# Patient Record
Sex: Female | Born: 1935 | Race: White | Hispanic: No | Marital: Married | Smoking: Former smoker
Health system: Southern US, Community
[De-identification: ages and names within clinical notes are randomized; demographics above are authoritative.]

## PROBLEM LIST (undated history)

## (undated) DIAGNOSIS — M109 Gout, unspecified: Secondary | ICD-10-CM

## (undated) DIAGNOSIS — I10 Essential (primary) hypertension: Secondary | ICD-10-CM

## (undated) DIAGNOSIS — I7300 Raynaud's syndrome without gangrene: Secondary | ICD-10-CM

## (undated) DIAGNOSIS — C801 Malignant (primary) neoplasm, unspecified: Secondary | ICD-10-CM

## (undated) DIAGNOSIS — Z8551 Personal history of malignant neoplasm of bladder: Secondary | ICD-10-CM

## (undated) DIAGNOSIS — C679 Malignant neoplasm of bladder, unspecified: Secondary | ICD-10-CM

## (undated) DIAGNOSIS — N289 Disorder of kidney and ureter, unspecified: Secondary | ICD-10-CM

## (undated) DIAGNOSIS — Z85528 Personal history of other malignant neoplasm of kidney: Secondary | ICD-10-CM

## (undated) DIAGNOSIS — J45909 Unspecified asthma, uncomplicated: Secondary | ICD-10-CM

## (undated) DIAGNOSIS — J449 Chronic obstructive pulmonary disease, unspecified: Secondary | ICD-10-CM

## (undated) DIAGNOSIS — I509 Heart failure, unspecified: Secondary | ICD-10-CM

## (undated) DIAGNOSIS — M1990 Unspecified osteoarthritis, unspecified site: Secondary | ICD-10-CM

## (undated) DIAGNOSIS — K219 Gastro-esophageal reflux disease without esophagitis: Secondary | ICD-10-CM

## (undated) HISTORY — DX: Gastro-esophageal reflux disease without esophagitis: K21.9

## (undated) HISTORY — PX: CATARACT EXTRACTION: SUR2

## (undated) HISTORY — DX: Unspecified osteoarthritis, unspecified site: M19.90

## (undated) HISTORY — DX: Heart failure, unspecified: I50.9

## (undated) HISTORY — DX: Personal history of other malignant neoplasm of kidney: Z85.528

## (undated) HISTORY — DX: Gout, unspecified: M10.9

## (undated) HISTORY — PX: BACK SURGERY: SHX140

## (undated) HISTORY — PX: NEPHRECTOMY: SHX65

## (undated) HISTORY — DX: Personal history of malignant neoplasm of bladder: Z85.51

## (undated) HISTORY — PX: APPENDECTOMY: SHX54

---

## 1999-01-30 ENCOUNTER — Ambulatory Visit (HOSPITAL_COMMUNITY): Admission: RE | Admit: 1999-01-30 | Discharge: 1999-01-30 | Payer: Self-pay | Admitting: Neurosurgery

## 1999-01-30 ENCOUNTER — Encounter: Payer: Self-pay | Admitting: Neurosurgery

## 1999-09-22 DIAGNOSIS — C801 Malignant (primary) neoplasm, unspecified: Secondary | ICD-10-CM

## 1999-09-22 HISTORY — DX: Malignant (primary) neoplasm, unspecified: C80.1

## 2003-09-24 ENCOUNTER — Other Ambulatory Visit: Payer: Self-pay

## 2004-09-23 ENCOUNTER — Ambulatory Visit: Payer: Self-pay | Admitting: Urology

## 2004-10-28 ENCOUNTER — Ambulatory Visit: Payer: Self-pay | Admitting: Internal Medicine

## 2005-03-31 ENCOUNTER — Ambulatory Visit: Payer: Self-pay | Admitting: Internal Medicine

## 2005-09-25 ENCOUNTER — Ambulatory Visit: Payer: Self-pay | Admitting: Internal Medicine

## 2005-11-10 ENCOUNTER — Ambulatory Visit: Payer: Self-pay | Admitting: Internal Medicine

## 2005-12-17 ENCOUNTER — Ambulatory Visit: Payer: Self-pay | Admitting: Gastroenterology

## 2006-03-31 ENCOUNTER — Ambulatory Visit: Payer: Self-pay

## 2006-04-07 ENCOUNTER — Ambulatory Visit: Payer: Self-pay | Admitting: General Surgery

## 2006-04-13 ENCOUNTER — Other Ambulatory Visit: Payer: Self-pay

## 2006-04-13 ENCOUNTER — Ambulatory Visit: Payer: Self-pay | Admitting: General Surgery

## 2006-04-21 ENCOUNTER — Ambulatory Visit: Payer: Self-pay | Admitting: General Surgery

## 2006-05-03 ENCOUNTER — Ambulatory Visit: Payer: Self-pay | Admitting: Internal Medicine

## 2006-08-06 ENCOUNTER — Other Ambulatory Visit: Payer: Self-pay

## 2006-08-06 ENCOUNTER — Emergency Department: Payer: Self-pay | Admitting: Emergency Medicine

## 2006-08-19 ENCOUNTER — Ambulatory Visit: Payer: Self-pay | Admitting: Internal Medicine

## 2006-09-04 ENCOUNTER — Ambulatory Visit: Payer: Self-pay | Admitting: Internal Medicine

## 2006-12-01 ENCOUNTER — Ambulatory Visit: Payer: Self-pay | Admitting: Internal Medicine

## 2007-05-25 ENCOUNTER — Ambulatory Visit: Payer: Self-pay | Admitting: Physician Assistant

## 2007-07-06 ENCOUNTER — Ambulatory Visit: Payer: Self-pay | Admitting: Urology

## 2007-07-06 ENCOUNTER — Other Ambulatory Visit: Payer: Self-pay

## 2007-07-20 ENCOUNTER — Ambulatory Visit: Payer: Self-pay | Admitting: Urology

## 2007-09-09 ENCOUNTER — Ambulatory Visit: Payer: Self-pay | Admitting: Internal Medicine

## 2007-12-05 ENCOUNTER — Ambulatory Visit: Payer: Self-pay | Admitting: Internal Medicine

## 2008-04-13 ENCOUNTER — Emergency Department: Payer: Self-pay | Admitting: Emergency Medicine

## 2008-12-06 ENCOUNTER — Ambulatory Visit: Payer: Self-pay | Admitting: Internal Medicine

## 2008-12-12 ENCOUNTER — Ambulatory Visit: Payer: Self-pay | Admitting: Urology

## 2008-12-19 ENCOUNTER — Ambulatory Visit: Payer: Self-pay | Admitting: Urology

## 2009-06-18 ENCOUNTER — Ambulatory Visit: Payer: Self-pay | Admitting: Urology

## 2009-12-10 ENCOUNTER — Ambulatory Visit: Payer: Self-pay | Admitting: Internal Medicine

## 2010-12-15 ENCOUNTER — Ambulatory Visit: Payer: Self-pay | Admitting: Internal Medicine

## 2011-06-09 ENCOUNTER — Ambulatory Visit: Payer: Self-pay | Admitting: Urology

## 2011-10-15 ENCOUNTER — Ambulatory Visit: Payer: Self-pay | Admitting: Internal Medicine

## 2011-11-03 ENCOUNTER — Ambulatory Visit: Payer: Self-pay | Admitting: Internal Medicine

## 2011-12-17 ENCOUNTER — Ambulatory Visit: Payer: Self-pay | Admitting: Internal Medicine

## 2012-05-25 ENCOUNTER — Encounter: Payer: Self-pay | Admitting: Internal Medicine

## 2012-06-21 ENCOUNTER — Encounter: Payer: Self-pay | Admitting: Internal Medicine

## 2012-07-25 ENCOUNTER — Emergency Department: Payer: Self-pay | Admitting: Emergency Medicine

## 2012-07-26 ENCOUNTER — Ambulatory Visit: Payer: Self-pay | Admitting: Neurology

## 2012-07-26 ENCOUNTER — Encounter: Payer: Self-pay | Admitting: Internal Medicine

## 2012-08-21 ENCOUNTER — Encounter: Payer: Self-pay | Admitting: Internal Medicine

## 2012-09-21 ENCOUNTER — Encounter: Payer: Self-pay | Admitting: Internal Medicine

## 2012-10-22 ENCOUNTER — Encounter: Payer: Self-pay | Admitting: Internal Medicine

## 2012-12-19 ENCOUNTER — Ambulatory Visit: Payer: Self-pay | Admitting: Internal Medicine

## 2013-03-02 ENCOUNTER — Emergency Department (HOSPITAL_COMMUNITY)
Admission: EM | Admit: 2013-03-02 | Discharge: 2013-03-02 | Disposition: A | Discharge status: Home / Self Care | Payer: Medicare Other | Attending: Emergency Medicine | Admitting: Emergency Medicine

## 2013-03-02 ENCOUNTER — Emergency Department (HOSPITAL_COMMUNITY): Payer: Medicare Other

## 2013-03-02 ENCOUNTER — Encounter (HOSPITAL_COMMUNITY): Payer: Self-pay | Admitting: Emergency Medicine

## 2013-03-02 DIAGNOSIS — Y9389 Activity, other specified: Secondary | ICD-10-CM | POA: Insufficient documentation

## 2013-03-02 DIAGNOSIS — I10 Essential (primary) hypertension: Secondary | ICD-10-CM | POA: Insufficient documentation

## 2013-03-02 DIAGNOSIS — T148XXA Other injury of unspecified body region, initial encounter: Secondary | ICD-10-CM | POA: Insufficient documentation

## 2013-03-02 DIAGNOSIS — Z7982 Long term (current) use of aspirin: Secondary | ICD-10-CM | POA: Insufficient documentation

## 2013-03-02 DIAGNOSIS — W19XXXA Unspecified fall, initial encounter: Secondary | ICD-10-CM

## 2013-03-02 DIAGNOSIS — Y929 Unspecified place or not applicable: Secondary | ICD-10-CM | POA: Insufficient documentation

## 2013-03-02 DIAGNOSIS — Z23 Encounter for immunization: Secondary | ICD-10-CM | POA: Insufficient documentation

## 2013-03-02 DIAGNOSIS — IMO0002 Reserved for concepts with insufficient information to code with codable children: Secondary | ICD-10-CM | POA: Insufficient documentation

## 2013-03-02 DIAGNOSIS — S0990XA Unspecified injury of head, initial encounter: Secondary | ICD-10-CM | POA: Insufficient documentation

## 2013-03-02 DIAGNOSIS — M549 Dorsalgia, unspecified: Secondary | ICD-10-CM

## 2013-03-02 DIAGNOSIS — Z79899 Other long term (current) drug therapy: Secondary | ICD-10-CM | POA: Insufficient documentation

## 2013-03-02 DIAGNOSIS — W010XXA Fall on same level from slipping, tripping and stumbling without subsequent striking against object, initial encounter: Secondary | ICD-10-CM | POA: Insufficient documentation

## 2013-03-02 HISTORY — DX: Essential (primary) hypertension: I10

## 2013-03-02 MED ORDER — OXYCODONE-ACETAMINOPHEN 5-325 MG PO TABS: 1.0000 | ORAL_TABLET | Freq: Four times a day (QID) | ORAL | Status: DC | PRN

## 2013-03-02 MED ORDER — TETANUS-DIPHTH-ACELL PERTUSSIS 5-2.5-18.5 LF-MCG/0.5 IM SUSP
0.5000 mL | Freq: Once | INTRAMUSCULAR | Status: AC
  Administered 2013-03-02: 0.5 mL via INTRAMUSCULAR
  Filled 2013-03-02: qty 0.5

## 2013-03-02 MED ORDER — HYDROMORPHONE HCL PF 1 MG/ML IJ SOLN
1.0000 mg | Freq: Once | INTRAMUSCULAR | Status: AC
  Administered 2013-03-02: 1 mg via INTRAVENOUS
  Filled 2013-03-02: qty 1

## 2013-03-02 MED ORDER — ONDANSETRON HCL 4 MG/2ML IJ SOLN
4.0000 mg | Freq: Once | INTRAMUSCULAR | Status: AC
  Administered 2013-03-02: 4 mg via INTRAVENOUS
  Filled 2013-03-02: qty 2

## 2013-03-02 NOTE — ED Notes (Signed)
Patient transported to CT 

## 2013-03-02 NOTE — ED Provider Notes (Signed)
History     CSN: [id number]  Arrival date & time 03/02/13  1221   First MD Initiated Contact with Patient 03/02/13 1235      Chief Complaint  Patient presents with  . Fall  . Laceration  . Abrasion    (Consider location/radiation/quality/duration/timing/severity/associated sxs/prior treatment) Patient is a 77 y.o. female presenting with fall and skin laceration. The history is provided by the patient (the pt fell and hit her head. she tripped.  no loc). No language interpreter was used.  Fall This is a new problem. The current episode started 1 to 2 hours ago. The problem occurs rarely. The problem has not changed since onset.Associated symptoms include headaches. Pertinent negatives include no chest pain and no abdominal pain. Nothing aggravates the symptoms. Nothing relieves the symptoms.  Laceration   Past Medical History  Diagnosis Date  . Hypertension     Past Surgical History  Procedure Laterality Date  . Back surgery      No family history on file.  History  Substance Use Topics  . Smoking status: Not on file  . Smokeless tobacco: Not on file  . Alcohol Use: Not on file    OB History   Grav Para Term Preterm Abortions TAB SAB Ect Mult Living                  Review of Systems  Constitutional: Negative for appetite change and fatigue.  HENT: Negative for congestion, sinus pressure and ear discharge.   Eyes: Negative for discharge.  Respiratory: Negative for cough.   Cardiovascular: Negative for chest pain.  Gastrointestinal: Negative for abdominal pain and diarrhea.  Genitourinary: Negative for frequency and hematuria.  Musculoskeletal: Positive for back pain.  Skin: Negative for rash.  Neurological: Positive for headaches. Negative for seizures.  Psychiatric/Behavioral: Negative for hallucinations.    Allergies  Review of patient's allergies indicates no known allergies.  Home Medications   Current Outpatient Rx  Name  Route  Sig  Dispense   Refill  . albuterol-ipratropium (COMBIVENT) 18-103 MCG/ACT inhaler   Inhalation   Inhale 2 puffs into the lungs every 6 (six) hours as needed for wheezing or shortness of breath.         Marland Kitchen amoxicillin (AMOXIL) 500 MG capsule   Oral   Take 500 mg by mouth every morning. Take once a day for 30 days         . aspirin EC 81 MG tablet   Oral   Take 81 mg by mouth every morning.         Marland Kitchen CALCIUM PO   Oral   Take 2 capsules by mouth every morning.         . Fluticasone-Salmeterol (ADVAIR) 100-50 MCG/DOSE AEPB   Inhalation   Inhale 1 puff into the lungs 2 (two) times daily.         Marland Kitchen losartan (COZAAR) 50 MG tablet   Oral   Take 50 mg by mouth every morning.         . metoprolol succinate (TOPROL-XL) 25 MG 24 hr tablet   Oral   Take 25 mg by mouth every morning.         . Multiple Vitamin (MULTIVITAMIN WITH MINERALS) TABS   Oral   Take 1 tablet by mouth every morning.         . theophylline (THEO-24) 300 MG 24 hr capsule   Oral   Take 300 mg by mouth every morning.         Marland Kitchen  oxyCODONE-acetaminophen (PERCOCET/ROXICET) 5-325 MG per tablet   Oral   Take 1 tablet by mouth every 6 (six) hours as needed for pain.   30 tablet   0     BP 166/83  Pulse 78  Temp(Src) 98.2 F (36.8 C) (Oral)  Resp 19  SpO2 94%  Physical Exam  Constitutional: She is oriented to person, place, and time. She appears well-developed.  HENT:  Head: Normocephalic.  Pt has a small laceration with a large hematoma to the right orbit  Eyes: Conjunctivae and EOM are normal. No scleral icterus.  Neck: Neck supple. No thyromegaly present.  Cardiovascular: Normal rate and regular rhythm.  Exam reveals no gallop and no friction rub.   No murmur heard. Pulmonary/Chest: No stridor. She has no wheezes. She has no rales. She exhibits no tenderness.  Abdominal: She exhibits no distension. There is no tenderness. There is no rebound.  Musculoskeletal: She exhibits no edema.  Moderate  tenderness lumbar spine  Lymphadenopathy:    She has no cervical adenopathy.  Neurological: She is oriented to person, place, and time. Coordination normal.  Skin: No rash noted. No erythema.  Psychiatric: She has a normal mood and affect. Her behavior is normal.    ED Course  Procedures (including critical care time)  Labs Reviewed - No data to display Dg Lumbar Spine Complete  03/02/2013   *RADIOLOGY REPORT*  Clinical Data: Fall.  Pain.  LUMBAR SPINE - COMPLETE 4+ VIEW  Comparison: None.  Findings: Levoconvex lumbar scoliosis.  Abdominal aortic atherosclerosis.  Surgical clips in the left abdomen.  Multilevel lumbar spondylosis.  There is irregularity of the superior endplate of L4, suspicious for acute or subacute compression fracture.  In the absence of comparison exams, this is age indeterminant.  Lumbar facet arthrosis is present.  There is no radiographic evidence of retropulsion.  Loss of height of L4 is estimated at 15% or less. Grade 1 anterolisthesis of L4 on L5 measures 4 mm, degenerative.  IMPRESSION: Superior endplate depression of L4 is suspicious for acute or subacute compression fracture.  In the absence of comparisons, this is age indeterminant.  Consider MRI for determining the age of the fracture.  Loss of height is mild and there is no radiographic retropulsion.   Original Report Authenticated By: Andreas Newport, M.D.   Dg Hip Complete Right  03/02/2013   *RADIOLOGY REPORT*  Clinical Data: Fall.  Laceration.  Abrasion.  RIGHT HIP - COMPLETE 2+ VIEW  Comparison: None.  Findings: Right total hip arthroplasty is present.  No complicating features.  No periprosthetic fracture.  Obturator rings appear intact.  Prosthetic right hip is located.  Calcified granuloma are present projected over the pelvis.  Left hip appears within normal limits.  Sacral arcades appear within normal limits.  Levoconvex curvature of the lumbar spine is partially visualized.  IMPRESSION: Uncomplicated right  total hip arthroplasty.  No acute osseous abnormality.   Original Report Authenticated By: Andreas Newport, M.D.   Ct Head Wo Contrast  03/02/2013   *RADIOLOGY REPORT*  Clinical Data:  Fall.  Hematoma of the right orbit. Possible Loss of consciousness.  CT HEAD WITHOUT CONTRAST CT MAXILLOFACIAL WITHOUT CONTRAST CT CERVICAL SPINE WITHOUT CONTRAST  Technique:  Multidetector CT imaging of the head, cervical spine, and maxillofacial structures were performed using the standard protocol without intravenous contrast. Multiplanar CT image reconstructions of the cervical spine and maxillofacial structures were also generated.  Comparison:   None  CT HEAD  Findings: 1.4 cm x 4 cm  hematoma is present over the superior lateral orbital rim on the right.  There is no underlying fracture of the orbit and globe appears intact.  Intracranially, there is no mass lesion, mass effect, midline shift, hydrocephalus, or hemorrhage.  No acute infarct.  Calvarium intact.  IMPRESSION: Negative CT brain.  Large right periorbital hematoma.  CT MAXILLOFACIAL  Findings:  The mandibular condyles are located.  The pterygoid plates are intact.  Globes and orbits are intact.  No facial fracture.  Soft tissue hematoma over the superior lateral right orbital rim is again noted.  The paranasal sinuses are clear. Mastoid air cells clear.  Upper dentures are present.  Artifact is present from dental fixation in the mandible.  Nasal bones intact. Bilateral lens extractions.  IMPRESSION: Negative for facial fracture.  CT CERVICAL SPINE  Findings:   Lung apices demonstrate severe centrilobular and paraseptal emphysema with areas of scarring.  14 mm right thyroid lobe nodule is present, nonspecific on CT.  There is no cervical spine fracture or dislocation.  Odontoid intact.  Craniocervical and atlantodental degenerative disease.  Severe degenerative disc disease from C4-C5 through C6-C7 with shallow disc osteophyte complexes.  2 mm retrolisthesis of C4  on C5 appears degenerative associated with collapse of the disc space.  Severe left C2-C3 facet arthrosis.  Moderate facet arthrosis at most of the levels of the cervical spine.  Bilateral foraminal encroachment associated with uncovertebral spurring.  T2-T3 severe left facet arthrosis noted. 2 mm of degenerative anterolisthesis of C3 on C4.  IMPRESSION: 1.  Moderate multilevel cervical spondylosis. 2.  No cervical spine fracture or dislocation. 3.  Right thyroid lobe hypo attenuating nodule. Consider further evaluation with thyroid ultrasound.  If patient is clinically hyperthyroid, consider nuclear medicine thyroid uptake and scan.   Original Report Authenticated By: Andreas Newport, M.D.   Ct Cervical Spine Wo Contrast  03/02/2013   *RADIOLOGY REPORT*  Clinical Data:  Fall.  Hematoma of the right orbit. Possible Loss of consciousness.  CT HEAD WITHOUT CONTRAST CT MAXILLOFACIAL WITHOUT CONTRAST CT CERVICAL SPINE WITHOUT CONTRAST  Technique:  Multidetector CT imaging of the head, cervical spine, and maxillofacial structures were performed using the standard protocol without intravenous contrast. Multiplanar CT image reconstructions of the cervical spine and maxillofacial structures were also generated.  Comparison:   None  CT HEAD  Findings: 1.4 cm x 4 cm hematoma is present over the superior lateral orbital rim on the right.  There is no underlying fracture of the orbit and globe appears intact.  Intracranially, there is no mass lesion, mass effect, midline shift, hydrocephalus, or hemorrhage.  No acute infarct.  Calvarium intact.  IMPRESSION: Negative CT brain.  Large right periorbital hematoma.  CT MAXILLOFACIAL  Findings:  The mandibular condyles are located.  The pterygoid plates are intact.  Globes and orbits are intact.  No facial fracture.  Soft tissue hematoma over the superior lateral right orbital rim is again noted.  The paranasal sinuses are clear. Mastoid air cells clear.  Upper dentures are  present.  Artifact is present from dental fixation in the mandible.  Nasal bones intact. Bilateral lens extractions.  IMPRESSION: Negative for facial fracture.  CT CERVICAL SPINE  Findings:   Lung apices demonstrate severe centrilobular and paraseptal emphysema with areas of scarring.  14 mm right thyroid lobe nodule is present, nonspecific on CT.  There is no cervical spine fracture or dislocation.  Odontoid intact.  Craniocervical and atlantodental degenerative disease.  Severe degenerative disc disease from C4-C5 through  C6-C7 with shallow disc osteophyte complexes.  2 mm retrolisthesis of C4 on C5 appears degenerative associated with collapse of the disc space.  Severe left C2-C3 facet arthrosis.  Moderate facet arthrosis at most of the levels of the cervical spine.  Bilateral foraminal encroachment associated with uncovertebral spurring.  T2-T3 severe left facet arthrosis noted. 2 mm of degenerative anterolisthesis of C3 on C4.  IMPRESSION: 1.  Moderate multilevel cervical spondylosis. 2.  No cervical spine fracture or dislocation. 3.  Right thyroid lobe hypo attenuating nodule. Consider further evaluation with thyroid ultrasound.  If patient is clinically hyperthyroid, consider nuclear medicine thyroid uptake and scan.   Original Report Authenticated By: Andreas Newport, M.D.   Ct Maxillofacial Wo Cm  03/02/2013   *RADIOLOGY REPORT*  Clinical Data:  Fall.  Hematoma of the right orbit. Possible Loss of consciousness.  CT HEAD WITHOUT CONTRAST CT MAXILLOFACIAL WITHOUT CONTRAST CT CERVICAL SPINE WITHOUT CONTRAST  Technique:  Multidetector CT imaging of the head, cervical spine, and maxillofacial structures were performed using the standard protocol without intravenous contrast. Multiplanar CT image reconstructions of the cervical spine and maxillofacial structures were also generated.  Comparison:   None  CT HEAD  Findings: 1.4 cm x 4 cm hematoma is present over the superior lateral orbital rim on the right.   There is no underlying fracture of the orbit and globe appears intact.  Intracranially, there is no mass lesion, mass effect, midline shift, hydrocephalus, or hemorrhage.  No acute infarct.  Calvarium intact.  IMPRESSION: Negative CT brain.  Large right periorbital hematoma.  CT MAXILLOFACIAL  Findings:  The mandibular condyles are located.  The pterygoid plates are intact.  Globes and orbits are intact.  No facial fracture.  Soft tissue hematoma over the superior lateral right orbital rim is again noted.  The paranasal sinuses are clear. Mastoid air cells clear.  Upper dentures are present.  Artifact is present from dental fixation in the mandible.  Nasal bones intact. Bilateral lens extractions.  IMPRESSION: Negative for facial fracture.  CT CERVICAL SPINE  Findings:   Lung apices demonstrate severe centrilobular and paraseptal emphysema with areas of scarring.  14 mm right thyroid lobe nodule is present, nonspecific on CT.  There is no cervical spine fracture or dislocation.  Odontoid intact.  Craniocervical and atlantodental degenerative disease.  Severe degenerative disc disease from C4-C5 through C6-C7 with shallow disc osteophyte complexes.  2 mm retrolisthesis of C4 on C5 appears degenerative associated with collapse of the disc space.  Severe left C2-C3 facet arthrosis.  Moderate facet arthrosis at most of the levels of the cervical spine.  Bilateral foraminal encroachment associated with uncovertebral spurring.  T2-T3 severe left facet arthrosis noted. 2 mm of degenerative anterolisthesis of C3 on C4.  IMPRESSION: 1.  Moderate multilevel cervical spondylosis. 2.  No cervical spine fracture or dislocation. 3.  Right thyroid lobe hypo attenuating nodule. Consider further evaluation with thyroid ultrasound.  If patient is clinically hyperthyroid, consider nuclear medicine thyroid uptake and scan.   Original Report Authenticated By: Andreas Newport, M.D.     1. Fall, initial encounter   2. Head injury,  initial encounter   3. Back pain       MDM  Pt will follow up with her md and tx with pain meds.        Benny Lennert, MD 03/02/13 1434

## 2013-03-02 NOTE — ED Notes (Signed)
Voiced understanding of instructions given 

## 2013-03-02 NOTE — ED Notes (Signed)
Returned from x-ray via stretcher.

## 2013-03-02 NOTE — Progress Notes (Signed)
   CARE MANAGEMENT ED NOTE 03/02/2013  Patient:  Catherine Landry, Catherine Landry   Account Number:  [id number]  Date Initiated:  03/02/2013  Documentation initiated by:  Radford Pax  Subjective/Objective Assessment:   Patient presented to ED post fall with back pain     Subjective/Objective Assessment Detail:     Action/Plan:   Action/Plan Detail:   Anticipated DC Date:       Status Recommendation to Physician:   Result of Recommendation:    Other ED Services  Consult Working Plan    DC Planning Services  Other  PCP issues    Choice offered to / List presented to:            Status of service:  Completed, signed off  ED Comments:   ED Comments Detail:  Patient listed as not having a pcp.  EDCM spoke to patient's husband as patient was in x-ray.  Patient's husband stated that he pcp is Dr. Joaquim Nam in [place].  No further needs at this time.

## 2013-03-02 NOTE — ED Notes (Signed)
Pt states that she was walking and fell.states that she has back pain and that she has pain to rt upper eye with a laceration bleeding controlled and a hematoma noted, bil abrasions to knees, takes an ASA no thinners. Alert x4,

## 2013-03-02 NOTE — ED Notes (Signed)
MD at bedside. Notified of pt O2 sat

## 2013-07-11 ENCOUNTER — Ambulatory Visit: Payer: Self-pay | Admitting: Urology

## 2013-07-11 DIAGNOSIS — I10 Essential (primary) hypertension: Secondary | ICD-10-CM

## 2013-07-19 ENCOUNTER — Ambulatory Visit: Payer: Self-pay | Admitting: Urology

## 2013-07-21 LAB — PATHOLOGY REPORT

## 2013-09-21 DIAGNOSIS — C679 Malignant neoplasm of bladder, unspecified: Secondary | ICD-10-CM

## 2013-09-21 HISTORY — DX: Malignant neoplasm of bladder, unspecified: C67.9

## 2013-10-23 ENCOUNTER — Ambulatory Visit: Payer: Self-pay | Admitting: Urology

## 2013-10-24 LAB — URINE CULTURE

## 2013-11-01 ENCOUNTER — Ambulatory Visit: Payer: Self-pay | Admitting: Urology

## 2013-11-04 LAB — PATHOLOGY REPORT

## 2013-12-04 ENCOUNTER — Emergency Department: Payer: Self-pay | Admitting: Emergency Medicine

## 2013-12-07 ENCOUNTER — Ambulatory Visit: Payer: Self-pay | Admitting: Internal Medicine

## 2013-12-20 ENCOUNTER — Ambulatory Visit: Payer: Self-pay | Admitting: Internal Medicine

## 2014-12-03 ENCOUNTER — Encounter
Admit: 2014-12-03 | Disposition: A | Discharge status: Home / Self Care | Payer: Self-pay | Attending: Radiation Oncology | Admitting: Radiation Oncology

## 2014-12-21 ENCOUNTER — Encounter
Admit: 2014-12-21 | Disposition: A | Discharge status: Home / Self Care | Payer: Self-pay | Attending: Radiation Oncology | Admitting: Radiation Oncology

## 2014-12-24 ENCOUNTER — Ambulatory Visit
Admit: 2014-12-24 | Disposition: A | Discharge status: Home / Self Care | Payer: Self-pay | Attending: Internal Medicine | Admitting: Internal Medicine

## 2015-01-11 NOTE — Op Note (Signed)
PATIENT NAME:  Catherine Landry, Catherine Landry MR#:  [id number] DATE OF BIRTH:  08-30-1936  DATE OF PROCEDURE:  07/19/2013  PREOPERATIVE DIAGNOSIS: Bladder tumor.   POSTOPERATIVE DIAGNOSIS: Bladder tumor.   PROCEDURE:  1. Transurethral resection of bladder tumor. 2. Right retrograde pyelogram.   SURGEON: Scott C. Bernardo Heater, MD  ASSISTANT: None.   ANESTHETIC: General.   INDICATIONS: A 79 year old female with a history of recurrent superficial, transitional cell carcinoma of the bladder. 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 She has recently had chronic cystitis. Recent cystoscopy shows an area on the left posterior wall. She presents for resection.   DESCRIPTION OF PROCEDURE: The patient was taken to the operating room, where general anesthetic was administered. She was then placed in the low lithotomy position, and her external genitalia were prepped and draped in the usual sterile fashion. A 21-French resectoscope sheath with obturator was lubricated and passed per urethra. Panendoscopy was then performed. On the left posterior wall was a 2 cm area of raised mucosa which appeared to be keratinized. This was resected superficially, and all specimen was removed. Hemostasis was obtained with cautery. The left ureter was absent. The right ureter was effluxing clear urine. A 5-French open-ended ureteral catheter was placed into the right ureter. Right retrograde pyelogram was performed. Extrarenal pelvis was noted without filling defects or lesions. The ureter was normal in its entirety. There was prompt emptying of the contrast seen under fluoroscopy upon catheter removal. Resection site was then examined, and all hemostasis was noted to be adequate. No other abnormalities were noted. The resectoscope was removed. An 18-French Foley catheter was placed, with return of clear effluent upon irrigation. A B and O suppository was placed per rectum. She was taken to the PACU  in stable condition. There were no complications. EBL minimal.   ____________________________ [name]. Bernardo Heater, MD scs:lb D: 07/19/2013 13:56:28 ET T: 07/19/2013 14:06:31 ET JOB#: [id number]  cc: Nicki Reaper C. Bernardo Heater, MD, <Dictator> Abbie Sons MD ELECTRONICALLY SIGNED 07/26/2013 8:43

## 2015-01-12 NOTE — Op Note (Signed)
PATIENT NAME:  Catherine Landry, Catherine Landry MR#:  [id number] DATE OF BIRTH:  [birth year]  DATE OF PROCEDURE:  11/01/2013  PREOPERATIVE DIAGNOSIS: History of transitional cell carcinoma of the bladder.   POSTOPERATIVE DIAGNOSIS: Bladder lesion.   PROCEDURE: Transurethral resection of bladder tumor (2 to 5 cm).   SURGEON: Arden Axon C. Bernardo Heater, MD  ASSISTANT: None.   ANESTHESIA: General.   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 She is previously status post a left nephroureterectomy. She has had superficial bladder tumors. She has also been bothered by chronic cystitis. She underwent resection of a bladder lesion approximately 3 months ago, which returned squamous metaplasia with dysplasia. She presents for followup cystoscopy under anesthesia with biopsy and possible TURBT.   DESCRIPTION OF PROCEDURE: The patient was taken to the cystoscopy suite and placed on the table in supine position. A general anesthetic was administered via an LMA, and she was then placed in the low lithotomy position. Her external genitalia were prepped and draped in the usual fashion. Timeout was performed per protocol, with all in agreement. A 27 French continuous flow resectoscope sheath with obturator was lubricated and passed per urethra without difficulty. Cystoscopy was performed, and on the right posterior wall, was an approximately 3 cm bladder lesion which had a typical appearance of a squamous metaplasia. No other lesions were identified. The right ureteral orifice was normal appearing with clear efflux. The left ureteral orifice was absent. The Iglesias resectoscope was placed in the sheath. The bladder lesion was resected down to superficial muscle. The lesion was resected in its entirety. The specimen was sent as a bladder lesion and base of tumor in 2 separate containers. All specimen was removed separately  by irrigation. The base was cauterized as well as the surrounding mucosa. At the completion of the procedure, hemostasis was adequate. Bladder was emptied, and a 16 French Foley catheter was placed with return of clear effluent upon irrigation. A B and O suppository was placed per rectum. She was taken to the PACU in stable condition. There were no complications. EBL minimal.    ____________________________ [name]. Bernardo Heater, MD scs:lb D: 11/02/2013 07:18:39 ET T: 11/02/2013 07:35:12 ET JOB#: [id number]  cc: Nicki Reaper C. Bernardo Heater, MD, <Dictator> Abbie Sons MD ELECTRONICALLY SIGNED 12/06/2013 9:59

## 2015-01-21 ENCOUNTER — Encounter: Payer: Medicare Other | Admitting: Physical Therapy

## 2015-01-25 ENCOUNTER — Encounter: Payer: Medicare Other | Admitting: Physical Therapy

## 2015-01-30 ENCOUNTER — Ambulatory Visit: Payer: Medicare Other | Attending: Internal Medicine | Admitting: Physical Therapy

## 2015-01-30 ENCOUNTER — Encounter: Payer: Self-pay | Admitting: Physical Therapy

## 2015-01-30 DIAGNOSIS — R32 Unspecified urinary incontinence: Secondary | ICD-10-CM | POA: Insufficient documentation

## 2015-01-30 DIAGNOSIS — C679 Malignant neoplasm of bladder, unspecified: Secondary | ICD-10-CM | POA: Diagnosis present

## 2015-01-30 DIAGNOSIS — R278 Other lack of coordination: Secondary | ICD-10-CM | POA: Diagnosis not present

## 2015-01-30 DIAGNOSIS — R293 Abnormal posture: Secondary | ICD-10-CM

## 2015-01-30 DIAGNOSIS — S39001S Unspecified injury of muscle, fascia and tendon of abdomen, sequela: Secondary | ICD-10-CM

## 2015-01-30 NOTE — Therapy (Signed)
[place] MAIN [facility] SERVICES [address] [place], [place], [address] Phone: [phone]   Fax:  [phone]  Physical Therapy Treatment  Patient Details  Name: Catherine Landry MRN: [id number] Date of Birth: November 05, 1935 Referring Provider:  Tracie Harrier, MD  Encounter Date: 01/30/2015      PT End of Session - 01/30/15 1407    Visit Number 9   Number of Visits 12   Date for PT Re-Evaluation 02/19/15   Authorization Type G-code submitted on #8 visit.   PT Start Time 1310   PT Stop Time 1355   PT Time Calculation (min) 45 min   Activity Tolerance Patient tolerated treatment well   Behavior During Therapy WFL for tasks assessed/performed      Past Medical History  Diagnosis Date  . Hypertension     Past Surgical History  Procedure Laterality Date  . Back surgery      There were no vitals filed for this visit.  Visit Diagnosis:  Unspecified injury of muscle, fascia and tendon of abdomen, sequela  Posture abnormality  Abnormal coordination      Subjective Assessment - 01/30/15 1325    Subjective Pt reported she saw Dr. Hulen Shouts MD at [facility]) and she was told her bladder was "too small" due to radiation (as visualized from cystoscope). Pt continues to get at night 4-5x night.    Currently in Pain? Other (Comment)  No more pelvic pain for > 1 month and pt has returned to doing normal activities without pain                         [facility] Adult PT Treatment/Exercise - 01/30/15 1359    Bed Mobility   Bed Mobility --  practiced log rolling. cue for use of R UE to push off bed   Exercises   Exercises --  don shoe w hip flex/ER ankle on opp thigh, not w/ spinalflex   Manual Therapy   Myofascial Release light pressure over scar (zigzag) and LQ (flat palm) with jostling of skin                PT Education - 01/30/15 1407    Education provided Yes   Education Details HEP    Person(s) Educated Patient   Methods Explanation;Demonstration;Tactile cues;Verbal cues   Comprehension Verbalized understanding;Returned demonstration;Verbal cues required;Tactile cues required             PT Long Term Goals - 01/30/15 1413    PT LONG TERM GOAL #1   Title Pt will report nocturia 2-3x /night instead of 4-6 night to improve QOL.    Time 12   Period Weeks   Status On-going   PT LONG TERM GOAL #2   Title Pt will be able to demo ability to lift 10# weight with proper deeper core mm activation in order lift pans for cooking.    Time 12   Period Weeks   Status On-going   PT LONG TERM GOAL #3   Title Patient will decrease her use of Depends from 5x/day to < 3xday for protection against leaking. (   Time 12   Period Weeks   Status On-going   PT LONG TERM GOAL #4   Title Pt will decrease her  NIH CPSI score from to  15/31 points to < 9 pts in order to show improved pain and urinary Sx.  (01/08/14: 6/31 pt)  Time 12   Period Weeks   Status Achieved               Plan - 01/30/15 1408    Clinical Impression Statement Pt tolerated manual Tx with report of feeling relaxed post-Tx. Noted increased scar immobility below umbilicus compared to superior portion. Scar massage added to HEP. Anticipate pt will progress well with skilled PT in order to increase fascial mobility to faciliate improved bladder function.    Pt will benefit from skilled therapeutic intervention in order to improve on the following deficits Decreased scar mobility;Decreased mobility;Impaired flexibility;Increased fascial restricitons   Rehab Potential Good   Clinical Impairments Affecting Rehab Potential radiated skin over pelvic area   PT Frequency 1x / week   PT Duration 12 weeks   PT Treatment/Interventions ADLs/Self Care Home Management;Aquatic Therapy;Neuromuscular re-education;Scar mobilization;Biofeedback;Functional mobility training;Patient/family education;Cryotherapy;Therapeutic  exercise;Therapeutic activities;Dry needling;Manual techniques;Moist Heat;Balance training   PT Next Visit Plan continue with light-moderate manual Tx with caution of radiated skin   Consulted and Agree with Plan of Care Patient       Problem List There are no active problems to display for this patient.   Jerl Mina ,PT, DPT, E-RYT  01/30/2015, 2:30 PM  [place] MAIN [facility] SERVICES [address] [place], [place], [address] Phone: [phone]   Fax:  [phone]

## 2015-01-30 NOTE — Patient Instructions (Addendum)
__demo'd and guide pt scar massage techniques (zigzag) over longitudinal scar, 5 min every night __Log rolling and don shoes with hip flex/ER ankle over opp thigh in order to minimize spinal flexion (minimize worsening of thoracic kyphotic curve)

## 2015-02-04 ENCOUNTER — Ambulatory Visit: Payer: Medicare Other | Admitting: Physical Therapy

## 2015-02-21 ENCOUNTER — Ambulatory Visit: Payer: Medicare Other | Attending: Internal Medicine | Admitting: Physical Therapy

## 2015-02-21 DIAGNOSIS — S39001S Unspecified injury of muscle, fascia and tendon of abdomen, sequela: Secondary | ICD-10-CM

## 2015-02-21 DIAGNOSIS — C679 Malignant neoplasm of bladder, unspecified: Secondary | ICD-10-CM | POA: Insufficient documentation

## 2015-02-21 DIAGNOSIS — R278 Other lack of coordination: Secondary | ICD-10-CM | POA: Insufficient documentation

## 2015-02-21 DIAGNOSIS — R293 Abnormal posture: Secondary | ICD-10-CM

## 2015-02-22 NOTE — Patient Instructions (Signed)
Proper lifting mechanics (feet position in squat position, exhalation on lifting)

## 2015-02-22 NOTE — Therapy (Signed)
[place] MAIN [facility] SERVICES [address] [place], [place], [address] Phone: [phone]   Fax:  [phone]  PHYSICAL THERAPY DISCHARGE SUMMARY  Visits from Start of Care:  12/03/14:   Current functional level related to goals / functional outcomes: See below   Remaining deficits: Nocturia   Education / Equipment: See below  Plan: Patient agrees to discharge.  Patient goals were met.Pain related goals were met. Urinary related goals were not met 2/2 to decreased size of bladder from radiation Tx.  Patient is being discharged due to meeting the stated rehab goals.  ?????        Physical Therapy Treatment  Patient Details  Name: Catherine Landry MRN: [id number] Date of Birth: Aug 15, 1936 Referring Provider:  Tracie Harrier, MD  Encounter Date: 02/21/2015      PT End of Session - 02/22/15 1202    Visit Number 10   Number of Visits 12   Date for PT Re-Evaluation 02/19/15   Authorization Type G-code submitted on #8 visit.   PT Start Time 0902   PT Stop Time 0945   PT Time Calculation (min) 43 min   Activity Tolerance Patient tolerated treatment well   Behavior During Therapy [facility] for tasks assessed/performed      Past Medical History  Diagnosis Date  . Hypertension     Past Surgical History  Procedure Laterality Date  . Back surgery      There were no vitals filed for this visit.  Visit Diagnosis:  Unspecified injury of muscle, fascia and tendon of abdomen, sequela  Posture abnormality  Abnormal coordination      Subjective Assessment - 02/21/15 0914    Subjective (p) Since [facility] in March 2016, pt's pelvic pain Sx has completely resolved and has returned to her PLFO and ADLs.  In terms of her urinary Sx, they have not improved. Pt reported visiting her urologist again since her last session and she has been informed that her bladder has decreased in size 2/2 radiation Tx. Pt has been referred to Dr. Lalla Brothers. Pt  reports she continues to have to get up 10+/x  to void at night but has not leaked through her Depends.  Pt notices more frequent voiding at night compared to day, with a consistent pattern starting at 7:30pm at night despite refrain from fluids at 6pm. Pt drinks 4-5 (16 fl oz) and 1.5 cups of coffee/ day.  Pt tried to avoid coffee but it has made no difference. Pt reports her husband notices she snores but has not be evaluated for sleep apnea before.                           [facility] Adult PT Treatment/Exercise - 02/22/15 1154    High Level Balance   High Level Balance Activities Other (comment)   High Level Balance Comments Lifting 10 # dumbbell, box from various heights and carrying to various heights. Initially squat position with narrow BOS, knees together. after cuing demo'd proper lifting mechanics with deep core engaged.                PT Education - 02/22/15 1157    Education provided Yes   Education Details proper lifting mechanics, reviewed goals, D/C plans, PT spoke w/ staff at Dr. Linton Ham office inquiring if a sleep study may be beneficial for pt due to pt's report of snoring, bronchitis, interrupted sleep due to frequent urination, and limited bladder  capacity due to radiation.    Person(s) Educated Patient   Methods Explanation;Demonstration   Comprehension Verbalized understanding;Returned demonstration             PT Long Term Goals - 02/21/15 0929    PT LONG TERM GOAL #1   Title Pt will report nocturia 2-3x /night instead of 4-6 night to improve QOL.    Time 12   Period Weeks   Status Not Met   PT LONG TERM GOAL #2   Title Pt will be able to demo ability to lift 10# weight with proper deeper core mm activation in order lift pans for cooking.    Time 12   Period Weeks   Status Achieved   PT LONG TERM GOAL #3   Title Patient will decrease her use of Depends from 5x/day to < 3xday for protection against leaking.   Time 12   Period Weeks    Status Not Met   PT LONG TERM GOAL #4   Title Pt will decrease her  NIH CPSI score from to  15/31 points to < 9 pts in order to show improved pain and urinary Sx.  (01/08/14: 6/31 pt)    Time 12   Period Weeks   Status Achieved               Plan - 02-27-2015 1203    Clinical Impression Statement Pt has achieved goals related to pelvic pain and has returned to PLOF with ADLs. Pt's goals related to urinary Sx (nocturia)  have not been met 2/2 to pt's report of decreased bladder size according to her MD. PT encouraged pt to f/u w/ MD's referral to Dr. Lalla Brothers and also inquired w/ Dr.Hande if pt may benefit from a sleep study due to her husband's report of snoring, pt's bronchitis,  and poor sleep quality due to frequent urination during the night. Pt is ready to be D/C until further recommendations for PT made by MD.  Pt was educated on the importance of strengthening/aerobic conditioning for health and prevention and provided pt information about the CARE program offered through the [facility].     Pt will benefit from skilled therapeutic intervention in order to improve on the following deficits Decreased scar mobility;Decreased mobility;Impaired flexibility;Increased fascial restricitons   Rehab Potential Good   Clinical Impairments Affecting Rehab Potential radiated skin over pelvic area   PT Frequency 1x / week   PT Duration 12 weeks   PT Treatment/Interventions ADLs/Self Care Home Management;Aquatic Therapy;Neuromuscular re-education;Scar mobilization;Biofeedback;Functional mobility training;Patient/family education;Cryotherapy;Therapeutic exercise;Therapeutic activities;Dry needling;Manual techniques;Moist Heat;Balance training   PT Next Visit Plan continue with light-moderate manual Tx with caution of radiated skin   Consulted and Agree with Plan of Care Patient          G-Codes - 02-27-15 Nov 09, 1215    Functional Limitation Self care   Self Care Current Status [phone]) At  least 1 percent but less than 20 percent impaired, limited or restricted   Self Care Goal Status ([id number]) At least 1 percent but less than 20 percent impaired, limited or restricted   Self Care Discharge Status [phone]) At least 1 percent but less than 20 percent impaired, limited or restricted  Pt's pain has resolved but her nocturia Sx have not 2/2 to decreased bladder size caused by radiation Tx.      Problem List There are no active problems to display for this patient.   Jerl Mina  ,PT, DPT, E-RYT  02-27-2015, 12:21 PM  [place] MAIN [facility] SERVICES [address] [place], [place], [address] Phone: [phone]   Fax:  [phone]

## 2015-03-21 ENCOUNTER — Encounter: Payer: Medicare Other | Admitting: Physical Therapy

## 2015-10-06 ENCOUNTER — Emergency Department: Payer: Medicare Other

## 2015-10-06 ENCOUNTER — Encounter: Payer: Self-pay | Admitting: Radiology

## 2015-10-06 ENCOUNTER — Inpatient Hospital Stay
Admission: EM | Admit: 2015-10-06 | Discharge: 2015-10-12 | DRG: 190 | Disposition: A | Discharge status: Home / Self Care | Payer: Medicare Other | Attending: Internal Medicine | Admitting: Internal Medicine

## 2015-10-06 DIAGNOSIS — J9601 Acute respiratory failure with hypoxia: Secondary | ICD-10-CM | POA: Diagnosis present

## 2015-10-06 DIAGNOSIS — Z79891 Long term (current) use of opiate analgesic: Secondary | ICD-10-CM | POA: Diagnosis not present

## 2015-10-06 DIAGNOSIS — C679 Malignant neoplasm of bladder, unspecified: Secondary | ICD-10-CM | POA: Diagnosis present

## 2015-10-06 DIAGNOSIS — J45909 Unspecified asthma, uncomplicated: Secondary | ICD-10-CM | POA: Diagnosis present

## 2015-10-06 DIAGNOSIS — J209 Acute bronchitis, unspecified: Secondary | ICD-10-CM | POA: Diagnosis present

## 2015-10-06 DIAGNOSIS — Z79899 Other long term (current) drug therapy: Secondary | ICD-10-CM

## 2015-10-06 DIAGNOSIS — Z87891 Personal history of nicotine dependence: Secondary | ICD-10-CM

## 2015-10-06 DIAGNOSIS — J440 Chronic obstructive pulmonary disease with acute lower respiratory infection: Secondary | ICD-10-CM | POA: Diagnosis not present

## 2015-10-06 DIAGNOSIS — R911 Solitary pulmonary nodule: Secondary | ICD-10-CM | POA: Diagnosis present

## 2015-10-06 DIAGNOSIS — J441 Chronic obstructive pulmonary disease with (acute) exacerbation: Secondary | ICD-10-CM | POA: Diagnosis present

## 2015-10-06 DIAGNOSIS — I10 Essential (primary) hypertension: Secondary | ICD-10-CM | POA: Diagnosis present

## 2015-10-06 DIAGNOSIS — I447 Left bundle-branch block, unspecified: Secondary | ICD-10-CM | POA: Diagnosis present

## 2015-10-06 DIAGNOSIS — R0902 Hypoxemia: Secondary | ICD-10-CM

## 2015-10-06 DIAGNOSIS — Z7982 Long term (current) use of aspirin: Secondary | ICD-10-CM | POA: Diagnosis not present

## 2015-10-06 DIAGNOSIS — J189 Pneumonia, unspecified organism: Secondary | ICD-10-CM | POA: Diagnosis present

## 2015-10-06 HISTORY — DX: Unspecified asthma, uncomplicated: J45.909

## 2015-10-06 HISTORY — DX: Disorder of kidney and ureter, unspecified: N28.9

## 2015-10-06 HISTORY — DX: Malignant (primary) neoplasm, unspecified: C80.1

## 2015-10-06 LAB — CBC WITH DIFFERENTIAL/PLATELET
BASOS ABS: 0 10*3/uL (ref 0–0.1)
BASOS PCT: 0 %
EOS PCT: 8 %
Eosinophils Absolute: 0.5 10*3/uL (ref 0–0.7)
HEMATOCRIT: 40.2 % (ref 35.0–47.0)
Hemoglobin: 13.4 g/dL (ref 12.0–16.0)
LYMPHS PCT: 14 %
Lymphs Abs: 1 10*3/uL (ref 1.0–3.6)
MCH: 32.8 pg (ref 26.0–34.0)
MCHC: 33.3 g/dL (ref 32.0–36.0)
MCV: 98.3 fL (ref 80.0–100.0)
Monocytes Absolute: 0.7 10*3/uL (ref 0.2–0.9)
Monocytes Relative: 10 %
NEUTROS ABS: 4.7 10*3/uL (ref 1.4–6.5)
Neutrophils Relative %: 68 %
PLATELETS: 216 10*3/uL (ref 150–440)
RBC: 4.09 MIL/uL (ref 3.80–5.20)
RDW: 15 % — AB (ref 11.5–14.5)
WBC: 7 10*3/uL (ref 3.6–11.0)

## 2015-10-06 LAB — BASIC METABOLIC PANEL
Anion gap: 8 (ref 5–15)
BUN: 16 mg/dL (ref 6–20)
CHLORIDE: 101 mmol/L (ref 101–111)
CO2: 29 mmol/L (ref 22–32)
Calcium: 9.3 mg/dL (ref 8.9–10.3)
Creatinine, Ser: 1.18 mg/dL — ABNORMAL HIGH (ref 0.44–1.00)
GFR calc non Af Amer: 43 mL/min — ABNORMAL LOW (ref >60)
GFR, EST AFRICAN AMERICAN: 50 mL/min — AB (ref >60)
Glucose, Bld: 101 mg/dL — ABNORMAL HIGH (ref 65–99)
POTASSIUM: 5.1 mmol/L (ref 3.5–5.1)
SODIUM: 138 mmol/L (ref 135–145)

## 2015-10-06 LAB — TROPONIN I

## 2015-10-06 MED ORDER — OXYBUTYNIN CHLORIDE 5 MG PO TABS
5.0000 mg | ORAL_TABLET | Freq: Three times a day (TID) | ORAL | Status: DC
  Administered 2015-10-08 – 2015-10-12 (×12): 5 mg via ORAL
  Filled 2015-10-06 (×18): qty 1

## 2015-10-06 MED ORDER — AZITHROMYCIN 250 MG PO TABS
250.0000 mg | ORAL_TABLET | Freq: Every day | ORAL | Status: AC
  Administered 2015-10-07 – 2015-10-10 (×4): 250 mg via ORAL
  Filled 2015-10-06 (×4): qty 1

## 2015-10-06 MED ORDER — THEOPHYLLINE ER 300 MG PO CP24
300.0000 mg | ORAL_CAPSULE | Freq: Every morning | ORAL | Status: DC
  Administered 2015-10-07 – 2015-10-12 (×6): 300 mg via ORAL
  Filled 2015-10-06 (×7): qty 1

## 2015-10-06 MED ORDER — ASPIRIN EC 81 MG PO TBEC
81.0000 mg | DELAYED_RELEASE_TABLET | Freq: Every morning | ORAL | Status: DC
  Administered 2015-10-07 – 2015-10-12 (×6): 81 mg via ORAL
  Filled 2015-10-06 (×6): qty 1

## 2015-10-06 MED ORDER — IPRATROPIUM-ALBUTEROL 0.5-2.5 (3) MG/3ML IN SOLN
3.0000 mL | Freq: Once | RESPIRATORY_TRACT | Status: AC
  Administered 2015-10-06: 3 mL via RESPIRATORY_TRACT
  Filled 2015-10-06: qty 3

## 2015-10-06 MED ORDER — CETYLPYRIDINIUM CHLORIDE 0.05 % MT LIQD
7.0000 mL | Freq: Two times a day (BID) | OROMUCOSAL | Status: DC
  Administered 2015-10-06 – 2015-10-12 (×9): 7 mL via OROMUCOSAL

## 2015-10-06 MED ORDER — ONDANSETRON HCL 4 MG PO TABS: 4.0000 mg | ORAL_TABLET | Freq: Four times a day (QID) | ORAL | Status: DC | PRN

## 2015-10-06 MED ORDER — METHYLPREDNISOLONE SODIUM SUCC 125 MG IJ SOLR
125.0000 mg | Freq: Once | INTRAMUSCULAR | Status: AC
  Administered 2015-10-06: 125 mg via INTRAVENOUS
  Filled 2015-10-06: qty 2

## 2015-10-06 MED ORDER — ACETAMINOPHEN 325 MG PO TABS
650.0000 mg | ORAL_TABLET | Freq: Four times a day (QID) | ORAL | Status: DC | PRN
  Administered 2015-10-07: 650 mg via ORAL
  Filled 2015-10-06: qty 2

## 2015-10-06 MED ORDER — DEXTROSE 5 % IV SOLN
1.0000 g | INTRAVENOUS | Status: DC
  Administered 2015-10-06 – 2015-10-10 (×5): 1 g via INTRAVENOUS
  Filled 2015-10-06 (×6): qty 10

## 2015-10-06 MED ORDER — IPRATROPIUM-ALBUTEROL 0.5-2.5 (3) MG/3ML IN SOLN
3.0000 mL | Freq: Four times a day (QID) | RESPIRATORY_TRACT | Status: DC
  Administered 2015-10-06 – 2015-10-10 (×17): 3 mL via RESPIRATORY_TRACT
  Filled 2015-10-06 (×18): qty 3

## 2015-10-06 MED ORDER — MOMETASONE FURO-FORMOTEROL FUM 100-5 MCG/ACT IN AERO
2.0000 | INHALATION_SPRAY | Freq: Two times a day (BID) | RESPIRATORY_TRACT | Status: DC
  Administered 2015-10-06 – 2015-10-12 (×12): 2 via RESPIRATORY_TRACT
  Filled 2015-10-06: qty 8.8

## 2015-10-06 MED ORDER — ONDANSETRON HCL 4 MG/2ML IJ SOLN: 4.0000 mg | Freq: Four times a day (QID) | INTRAMUSCULAR | Status: DC | PRN

## 2015-10-06 MED ORDER — METOPROLOL SUCCINATE ER 25 MG PO TB24
25.0000 mg | ORAL_TABLET | Freq: Every morning | ORAL | Status: DC
  Administered 2015-10-06 – 2015-10-12 (×7): 25 mg via ORAL
  Filled 2015-10-06 (×7): qty 1

## 2015-10-06 MED ORDER — HYDROCOD POLST-CPM POLST ER 10-8 MG/5ML PO SUER
5.0000 mL | Freq: Two times a day (BID) | ORAL | Status: DC
  Administered 2015-10-06 – 2015-10-09 (×6): 5 mL via ORAL
  Filled 2015-10-06 (×6): qty 5

## 2015-10-06 MED ORDER — IOHEXOL 350 MG/ML SOLN
60.0000 mL | Freq: Once | INTRAVENOUS | Status: AC | PRN
  Administered 2015-10-06: 60 mL via INTRAVENOUS

## 2015-10-06 MED ORDER — ENOXAPARIN SODIUM 40 MG/0.4ML ~~LOC~~ SOLN
40.0000 mg | SUBCUTANEOUS | Status: DC
  Administered 2015-10-06 – 2015-10-11 (×6): 40 mg via SUBCUTANEOUS
  Filled 2015-10-06 (×6): qty 0.4

## 2015-10-06 MED ORDER — CALCIUM CARBONATE-VITAMIN D 500-200 MG-UNIT PO TABS
2.0000 | ORAL_TABLET | Freq: Every day | ORAL | Status: DC
  Administered 2015-10-07 – 2015-10-12 (×6): 2 via ORAL
  Filled 2015-10-06 (×6): qty 2

## 2015-10-06 MED ORDER — LOSARTAN POTASSIUM 50 MG PO TABS
50.0000 mg | ORAL_TABLET | Freq: Every morning | ORAL | Status: DC
  Administered 2015-10-07 – 2015-10-12 (×6): 50 mg via ORAL
  Filled 2015-10-06 (×6): qty 1

## 2015-10-06 MED ORDER — ADULT MULTIVITAMIN W/MINERALS CH
1.0000 | ORAL_TABLET | Freq: Every morning | ORAL | Status: DC
  Administered 2015-10-07 – 2015-10-12 (×6): 1 via ORAL
  Filled 2015-10-06 (×6): qty 1

## 2015-10-06 MED ORDER — ACETAMINOPHEN 650 MG RE SUPP: 650.0000 mg | Freq: Four times a day (QID) | RECTAL | Status: DC | PRN

## 2015-10-06 MED ORDER — SODIUM CHLORIDE 0.9 % IV SOLN
INTRAVENOUS | Status: DC
  Administered 2015-10-06 – 2015-10-07 (×4): via INTRAVENOUS

## 2015-10-06 MED ORDER — AZITHROMYCIN 250 MG PO TABS
500.0000 mg | ORAL_TABLET | Freq: Every day | ORAL | Status: AC
  Administered 2015-10-06: 500 mg via ORAL
  Filled 2015-10-06: qty 2

## 2015-10-06 MED ORDER — CALCIUM 75 MG PO TABS: ORAL_TABLET | Freq: Every morning | ORAL | Status: DC

## 2015-10-06 MED ORDER — OXYCODONE-ACETAMINOPHEN 5-325 MG PO TABS: 1.0000 | ORAL_TABLET | Freq: Four times a day (QID) | ORAL | Status: DC | PRN

## 2015-10-06 MED ORDER — METHYLPREDNISOLONE SODIUM SUCC 125 MG IJ SOLR
60.0000 mg | Freq: Two times a day (BID) | INTRAMUSCULAR | Status: DC
  Administered 2015-10-07: 60 mg via INTRAVENOUS
  Filled 2015-10-06: qty 2

## 2015-10-06 MED ORDER — IPRATROPIUM-ALBUTEROL 18-103 MCG/ACT IN AERO: 2.0000 | INHALATION_SPRAY | Freq: Four times a day (QID) | RESPIRATORY_TRACT | Status: DC | PRN

## 2015-10-06 NOTE — ED Notes (Signed)
Pt's sats dropped to 88%, turned O2 up to 4L at this time

## 2015-10-06 NOTE — H&P (Signed)
Catherine Landry NAME: Catherine Landry    MR#:  [id number]  DATE OF BIRTH:  10/24/1935  DATE OF ADMISSION:  10/06/2015  PRIMARY CARE PHYSICIAN: Tracie Harrier, MD   REQUESTING/REFERRING PHYSICIAN: Shortness of breath, cough.  CHIEF COMPLAINT:   Chief Complaint  Patient presents with  . Shortness of Breath    HISTORY OF PRESENT ILLNESS:  Catherine Landry  is a 80 y.o. female with a known history of COPD, recently received oneround of antibiotics for bronchitis, prednisone comes in because of persistent shortness of breath, cough. Started to have shortness of breath about 2 weeks ago. Still having shortness of breath, cough. Started second round of azithromycin yesterday. Cough with green phlegm. Chest tightness and wheezing.  PAST MEDICAL HISTORY:   Past Medical History  Diagnosis Date  . Hypertension   . Renal insufficiency   . Asthma   . Cancer [facility])     PAST SURGICAL HISTOIRY:   Past Surgical History  Procedure Laterality Date  . Back surgery      SOCIAL HISTORY:   Social History  Substance Use Topics  . Smoking status: Former Smoker    Quit date: 10/10/2014  . Smokeless tobacco: Never Used  . Alcohol Use: No    FAMILY HISTORY:  No family history on file. No family history of hypertension, diabetes. DRUG ALLERGIES:  No Known Allergies  REVIEW OF SYSTEMS:  CONSTITUTIONAL: No fever, fatigue or weakness.  EYES: No blurred or double vision.  EARS, NOSE, AND THROAT: No tinnitus or ear pain.  RESPIRATORY: Cough, shortness of breath, wheezing.Marland Kitchen  CARDIOVASCULAR: No chest pain, orthopnea, edema.  GASTROINTESTINAL: No nausea, vomiting, diarrhea or abdominal pain.  GENITOURINARY: No dysuria, hematuria.  ENDOCRINE: No polyuria, nocturia,  HEMATOLOGY: No anemia, easy bruising or bleeding SKIN: No rash or lesion. MUSCULOSKELETAL: No joint pain or arthritis.   NEUROLOGIC: No tingling, numbness, weakness.   PSYCHIATRY: No anxiety or depression.   MEDICATIONS AT HOME:   Prior to Admission medications   Medication Sig Start Date End Date Taking? Authorizing Provider  albuterol-ipratropium (COMBIVENT) 18-103 MCG/ACT inhaler Inhale 2 puffs into the lungs every 6 (six) hours as needed for wheezing or shortness of breath.    Historical Provider, MD  aspirin EC 81 MG tablet Take 81 mg by mouth every morning.    Historical Provider, MD  CALCIUM PO Take 2 capsules by mouth every morning.    Historical Provider, MD  Fluticasone-Salmeterol (ADVAIR) 100-50 MCG/DOSE AEPB Inhale 1 puff into the lungs 2 (two) times daily.    Historical Provider, MD  losartan (COZAAR) 50 MG tablet Take 50 mg by mouth every morning.    Historical Provider, MD  metoprolol succinate (TOPROL-XL) 25 MG 24 hr tablet Take 25 mg by mouth every morning.    Historical Provider, MD  Multiple Vitamin (MULTIVITAMIN WITH MINERALS) TABS Take 1 tablet by mouth every morning.    Historical Provider, MD  oxybutynin (DITROPAN) 5 MG tablet Take 5 mg by mouth 3 (three) times daily.    Historical Provider, MD  oxyCODONE-acetaminophen (PERCOCET/ROXICET) 5-325 MG per tablet Take 1 tablet by mouth every 6 (six) hours as needed for pain. Patient not taking: Reported on 01/30/2015 03/02/13   Milton Ferguson, MD  theophylline (THEO-24) 300 MG 24 hr capsule Take 300 mg by mouth every morning.    Historical Provider, MD      VITAL SIGNS:  Blood pressure 151/82, pulse 101, temperature 98 F (36.7 C), temperature source  Oral, resp. rate 22, height 5\' 3"  (1.6 m), weight 67.586 kg (149 lb), SpO2 99 %.  PHYSICAL EXAMINATION:  GENERAL:  80 y.o.-year-old patient lying in the bed with no acute distress.  EYES: Pupils equal, round, reactive to light and accommodation. No scleral icterus. Extraocular muscles intact.  HEENT: Head atraumatic, normocephalic. Oropharynx and nasopharynx clear.  NECK:  Supple, no jugular venous distention. No thyroid enlargement, no  tenderness.  LUNGS: Normal breath sounds bilaterally, no wheezing, rales,rhonchi or crepitation. No use of accessory muscles of respiration.  CARDIOVASCULAR: S1, S2 normal. No murmurs, rubs, or gallops.  ABDOMEN: Soft, nontender, nondistended. Bowel sounds present. No organomegaly or mass.  EXTREMITIES: No pedal edema, cyanosis, or clubbing.  NEUROLOGIC: Cranial nerves II through XII are intact. Muscle strength 5/5 in all extremities. Sensation intact. Gait not checked.  PSYCHIATRIC: The patient is alert and oriented x 3.  SKIN: No obvious rash, lesion, or ulcer.   LABORATORY PANEL:   CBC  Recent Labs Lab 10/06/15 0855  WBC 7.0  HGB 13.4  HCT 40.2  PLT 216   ------------------------------------------------------------------------------------------------------------------  Chemistries   Recent Labs Lab 10/06/15 0855  NA 138  K 5.1  CL 101  CO2 29  GLUCOSE 101*  BUN 16  CREATININE 1.18*  CALCIUM 9.3   ------------------------------------------------------------------------------------------------------------------  Cardiac Enzymes  Recent Labs Lab 10/06/15 0855  TROPONINI <0.03   ------------------------------------------------------------------------------------------------------------------  RADIOLOGY:  Dg Chest 2 View  10/06/2015  CLINICAL DATA:  Severe shortness of breath and cough being yesterday. EXAM: CHEST  2 VIEW COMPARISON:  08/06/2006 FINDINGS: Heart size is within normal limits. Both lungs are clear. Pulmonary hyperinflation and flattening of hemidiaphragms is seen, consistent with COPD. No evidence of pneumothorax or pleural effusion. Mild thoracic spine degenerative changes noted. IMPRESSION: Stable exam.  COPD.  No active disease. Electronically Signed   By: Earle Gell M.D.   On: 10/06/2015 10:14   Ct Angio Chest Pe W/cm &/or Wo Cm  10/06/2015  CLINICAL DATA:  Productive cough and congestion for 6 weeks. Short of breath last night. EXAM: CT  ANGIOGRAPHY CHEST WITH CONTRAST TECHNIQUE: Multidetector CT imaging of the chest was performed using the standard protocol during bolus administration of intravenous contrast. Multiplanar CT image reconstructions and MIPs were obtained to evaluate the vascular anatomy. CONTRAST:  27mL OMNIPAQUE IOHEXOL 350 MG/ML SOLN COMPARISON:  09/25/2005 FINDINGS: There are no filling defects in the pulmonary arterial tree to suggest acute pulmonary thromboembolism. No evidence of aortic dissection or aneurysm. Minimal aortic valvular calcification. Three vessel coronary artery calcification. Small scattered mediastinal nodes. 2.0 x 2.9 cm irregular patchy density in the right upper lobe, on image 47, is characterized by irregular linear markings and ground-glass. Similar smaller density in the right upper lobe on image 59. Superimposed emphysema. No pneumothorax.  No pleural effusion. No vertebral compression deformity. Stable 1.5 cm right adrenal nodule. Stable asymmetric right breast tissue. Review of the MIP images confirms the above findings. IMPRESSION: No evidence of acute pulmonary thromboembolism 2.9 cm right upper lobe ground-glass and irregular pulmonary opacity. Differential diagnosis includes an inflammatory process versus adenoidal carcinoma. Initial follow-up by chest CT without contrast is recommended in 3 months to confirm persistence. This recommendation follows the consensus statement: Recommendations for the Management of Subsolid Pulmonary Nodules Detected at CT: A Statement from the [place] as published in Radiology 2013; 266:304-317. Electronically Signed   By: Marybelle Killings M.D.   On: 10/06/2015 12:51    EKG:   Orders placed or performed in  visit on 07/11/13  . EKG 12-Lead   Normal sinus rhythm 91 bpm no ST-T changes. IMPRESSION AND PLAN:   #1 COPD exacerbation: Continue steroids, nebulizers. Patient on theophylline continue that. # 2. mild hypoxia: Patient O2 sats 92% on room  air.wean off o2 as tolerated to keep sats 90% #3 .lung nodule; right upper lobe pneumonia versus adenocarcinoma of the lung: By chest CT in 3 months to confirm persistence. . Patient right now is on Rocephin, Zithromax. #4. hypertension: Controlled. Continue losartan, metoprolol. #5. GI prophylaxis and DVT prophylaxis.  All the records are reviewed and case discussed with ED provider. Management plans discussed with the patient, family and they are in agreement.  CODE STATUS: Full  TOTAL TIME TAKING CARE OF THIS PATIENT: 55 minutes.    [name] M.D on 10/06/2015 at 2:08 PM  Between 7am to 6pm - Pager - [phone]  After 6pm go to [email or web address] - password [email or web address] [facility]  Office  [phone]  CC: Primary care physician; Tracie Harrier, MD  Note: This dictation was prepared with Dragon dictation along with smaller phrase technology. Any transcriptional errors that result from this process are unintentional.

## 2015-10-06 NOTE — ED Notes (Signed)
Pt states that it is easier to breath on O2, currently on 3L, sats 95%

## 2015-10-06 NOTE — ED Notes (Signed)
Pt reports cold symptoms for at least 2 weeks and she is currently on her second dose of abx. Pt reports SOB, Chest pain and sore throat. Unknown fever but reports sweats and chills. Pt states history of COPD.

## 2015-10-06 NOTE — ED Provider Notes (Signed)
[facility] Emergency Department Provider Note   ____________________________________________  Time seen:  I have reviewed the triage vital signs and the triage nursing note.  HISTORY  Chief Complaint Shortness of Breath   Historian Patient  HPI Catherine Landry is a 80 y.o. female with a history of COPD and hypertension who does not wear home O2, states that she's had persistently increasing trouble breathing for about 2 weeks. The last 2-3 days have been much worse. She called her primary care physician's office yesterday who called her in the antibiotic azithromycin. She took 2 pills yesterday. She woke up this morning feeling increasingly short of breath with a cough, nonproductive. No fever. Mild chest pressure but no chest pain. She gets extremely winded just with short steps or talking.    Past Medical History  Diagnosis Date  . Hypertension   . Renal insufficiency   . Asthma   . Cancer ([place])     There are no active problems to display for this patient.   Past Surgical History  Procedure Laterality Date  . Back surgery      Current Outpatient Rx  Name  Route  Sig  Dispense  Refill  . albuterol-ipratropium (COMBIVENT) 18-103 MCG/ACT inhaler   Inhalation   Inhale 2 puffs into the lungs every 6 (six) hours as needed for wheezing or shortness of breath.         Marland Kitchen aspirin EC 81 MG tablet   Oral   Take 81 mg by mouth every morning.         Marland Kitchen CALCIUM PO   Oral   Take 2 capsules by mouth every morning.         . Fluticasone-Salmeterol (ADVAIR) 100-50 MCG/DOSE AEPB   Inhalation   Inhale 1 puff into the lungs 2 (two) times daily.         Marland Kitchen losartan (COZAAR) 50 MG tablet   Oral   Take 50 mg by mouth every morning.         . metoprolol succinate (TOPROL-XL) 25 MG 24 hr tablet   Oral   Take 25 mg by mouth every morning.         . Multiple Vitamin (MULTIVITAMIN WITH MINERALS) TABS   Oral   Take 1 tablet by mouth every  morning.         Marland Kitchen oxybutynin (DITROPAN) 5 MG tablet   Oral   Take 5 mg by mouth 3 (three) times daily.         Marland Kitchen oxyCODONE-acetaminophen (PERCOCET/ROXICET) 5-325 MG per tablet   Oral   Take 1 tablet by mouth every 6 (six) hours as needed for pain. Patient not taking: Reported on 01/30/2015   30 tablet   0   . theophylline (THEO-24) 300 MG 24 hr capsule   Oral   Take 300 mg by mouth every morning.           Allergies Review of patient's allergies indicates no known allergies.  No family history on file.  Social History Social History  Substance Use Topics  . Smoking status: Former Smoker    Quit date: 10/10/2014  . Smokeless tobacco: Never Used  . Alcohol Use: No    Review of Systems  Constitutional: Negative for fever. Eyes: Negative for visual changes. ENT: Negative for sore throat. Cardiovascular: Negative for palpitations. Respiratory: Positive for shortness of breath. Gastrointestinal: Negative for abdominal pain, vomiting and diarrhea. Genitourinary: Negative for dysuria. Musculoskeletal: Negative for back pain. Skin: Negative  for rash. Neurological: Negative for headache. 10 point Review of Systems otherwise negative ____________________________________________   PHYSICAL EXAM:  VITAL SIGNS: ED Triage Vitals  Enc Vitals Group     BP 10/06/15 0833 157/74 mmHg     Pulse Rate 10/06/15 0833 95     Resp 10/06/15 0833 22     Temp 10/06/15 0833 98 F (36.7 C)     Temp Source 10/06/15 0833 Oral     SpO2 10/06/15 0833 93 %     Weight 10/06/15 0833 149 lb (67.586 kg)     Height 10/06/15 0833 5\' 3"  (1.6 m)     Head Cir --      Peak Flow --      Pain Score 10/06/15 0837 8     Pain Loc --      Pain Edu? --      Excl. in [place]? --      Constitutional: Alert and oriented. Well appearing and in no distress. Eyes: Conjunctivae are normal. PERRL. Normal extraocular movements. ENT   Head: Normocephalic and atraumatic.   Nose: No  congestion/rhinnorhea.   Mouth/Throat: Mucous membranes are moist.   Neck: No stridor. Cardiovascular/Chest: Normal rate, regular rhythm.  No murmurs, rubs, or gallops. Respiratory: Normal respiratory effort without tachypnea nor retractions. Severe rhonchi left mid and lower lung fields. Somewhat decreased breath sounds throughout. Mild wheezing. Gastrointestinal: Soft. No distention, no guarding, no rebound. Nontender.    Genitourinary/rectal:Deferred Musculoskeletal: Nontender with normal range of motion in all extremities. No joint effusions.  No lower extremity tenderness.  No edema. Neurologic:  Normal speech and language. No gross or focal neurologic deficits are appreciated. Skin:  Skin is warm, dry and intact. No rash noted. Psychiatric: Mood and affect are normal. Speech and behavior are normal. Patient exhibits appropriate insight and judgment.  ____________________________________________   EKG I, Lisa Roca, MD, the attending physician have personally viewed and interpreted all ECGs.  91 bpm. Normal sinus rhythm. Incomplete left bundle branch block. Normal ST and T-wave ____________________________________________  LABS (pertinent positives/negatives)  Basic metabolic panel without significant abnormality Troponin less than 0.03 White blood count 7.0, hemoglobin 13.4 and platelet count 216  ____________________________________________  RADIOLOGY All Xrays were viewed by me. Imaging interpreted by Radiologist.  Chest 2 view: Stable exam COPD, no active disease  CT for PE:   IMPRESSION: No evidence of acute pulmonary thromboembolism  2.9 cm right upper lobe ground-glass and irregular pulmonary opacity. Differential diagnosis includes an inflammatory process versus adenoidal carcinoma. Initial follow-up by chest CT without contrast is recommended in 3 months to confirm persistence. This recommendation follows the consensus statement: Recommendations  for the Management of Subsolid Pulmonary Nodules Detected at CT: A Statement from the [place] as published in Radiology 2013; 266:304-317. __________________________________________  PROCEDURES  Procedure(s) performed: None  Critical Care performed: None  ____________________________________________   ED COURSE / ASSESSMENT AND PLAN  CONSULTATIONS:  Hospitalist for admission  Pertinent labs & imaging results that were available during my care of the patient were reviewed by me and considered in my medical decision making (see chart for details).   Patient hypoxic on room air with significant dyspnea on arrival as well as rhonchi as well as wheezing. Consider pneumonia versus COPD exacerbation versus other causes of hypoxia and shortness of breath. Patient does not wear home O2 with a history of COPD at home. She did take 1 dose metabolic yesterday. She has no fever here. Her laboratory studies are reassuring with no elevated white  blood cell count. However she does remain hypoxic even after 2 DuoNeb treatments. Her lung sounds are asymmetric with rhonchi more in the left lung, and nasty cough, however chest x-ray showed no sign of infiltrate/pneumonia.  She got up to the bathroom her O2 sat did drop into the mid 80s on 2 L nasal cannula.  I discussed with her this could be all hypoxia due to COPD exacerbation, however in order to further evaluate, a CT scan of the chest is obtained to also rule out PE.  She will need hospitalization due to the hypoxia.  She was given Solu-Medrol in addition to DuoNeb for at this point COPD seems the most likely etiology.  ----------------------------------------- 1:37 PM on 10/06/2015 -----------------------------------------  No focal pneumonia, nor PE seen on the CT scan of the chest. Patient will be admitted for COPD exacerbation with hypoxia. I will defer decision of adding Rocephin to the patient's current azithromycin to the  admitting physician team.  Patient / Family / Caregiver informed of clinical course, medical decision-making process, and agree with plan.    ___________________________________________   FINAL CLINICAL IMPRESSION(S) / ED DIAGNOSES   Final diagnoses:  Hypoxia  COPD exacerbation ([place])              Note: This dictation was prepared with Dragon dictation. Any transcriptional errors that result from this process are unintentional   Lisa Roca, MD 10/06/15 1337

## 2015-10-06 NOTE — Progress Notes (Signed)
Patient admitted to unit. Oriented to room, call bell, and staff. Bed in lowest position. Fall safety plan reviewed. Full assessment to Epic. Skin assessment verified with Susan Presto RN. Telemetry box verification with tele clerk and Shana NT. Will continue to monitor.  

## 2015-10-07 LAB — CBC
HEMATOCRIT: 37.3 % (ref 35.0–47.0)
Hemoglobin: 12.5 g/dL (ref 12.0–16.0)
MCH: 32.9 pg (ref 26.0–34.0)
MCHC: 33.7 g/dL (ref 32.0–36.0)
MCV: 97.6 fL (ref 80.0–100.0)
PLATELETS: 196 10*3/uL (ref 150–440)
RBC: 3.82 MIL/uL (ref 3.80–5.20)
RDW: 14.6 % — AB (ref 11.5–14.5)
WBC: 5.6 10*3/uL (ref 3.6–11.0)

## 2015-10-07 LAB — BASIC METABOLIC PANEL
Anion gap: 6 (ref 5–15)
BUN: 23 mg/dL — AB (ref 6–20)
CHLORIDE: 103 mmol/L (ref 101–111)
CO2: 28 mmol/L (ref 22–32)
CREATININE: 1.34 mg/dL — AB (ref 0.44–1.00)
Calcium: 8.7 mg/dL — ABNORMAL LOW (ref 8.9–10.3)
GFR calc Af Amer: 43 mL/min — ABNORMAL LOW (ref >60)
GFR calc non Af Amer: 37 mL/min — ABNORMAL LOW (ref >60)
GLUCOSE: 179 mg/dL — AB (ref 65–99)
POTASSIUM: 5 mmol/L (ref 3.5–5.1)
SODIUM: 137 mmol/L (ref 135–145)

## 2015-10-07 LAB — GLUCOSE, CAPILLARY: Glucose-Capillary: 144 mg/dL — ABNORMAL HIGH (ref 65–99)

## 2015-10-07 MED ORDER — ALLOPURINOL 100 MG PO TABS
100.0000 mg | ORAL_TABLET | Freq: Every day | ORAL | Status: DC
  Administered 2015-10-07 – 2015-10-12 (×6): 100 mg via ORAL
  Filled 2015-10-07 (×6): qty 1

## 2015-10-07 MED ORDER — PANTOPRAZOLE SODIUM 40 MG PO TBEC
40.0000 mg | DELAYED_RELEASE_TABLET | Freq: Every day | ORAL | Status: DC
  Administered 2015-10-08 – 2015-10-12 (×5): 40 mg via ORAL
  Filled 2015-10-07 (×5): qty 1

## 2015-10-07 MED ORDER — METHYLPREDNISOLONE SODIUM SUCC 40 MG IJ SOLR
40.0000 mg | Freq: Two times a day (BID) | INTRAMUSCULAR | Status: DC
  Administered 2015-10-07 – 2015-10-11 (×8): 40 mg via INTRAVENOUS
  Filled 2015-10-07 (×8): qty 1

## 2015-10-07 NOTE — Care Management Note (Signed)
Case Management Note  Patient Details  Name: Ermie Glendenning MRN: [id number] Date of Birth: 11/07/1935  Subjective/Objective:   RNCM assessment for discharge planning. Failed outpatient treatment for COPD exacerbation. Met with patient at bedside. She lives at home with her spouse. Prior to admission, patient reports she was independent, active, driving and required no assistive devices or home O2.. Discussed home 02 and the medicare requirements. Will reaasses further as discharge approaches. It is anticipated that patient will be ready for discharge in 2-3 days. At that time, patient 45 sats will need to be reassessed. Current with PCP, Dr. Ginette Pitman.                  Action/Plan: Following progression.   Expected Discharge Date:                  Expected Discharge Plan:  Home/Self Care  In-House Referral:     Discharge planning Services  CM Consult  Post Acute Care Choice:    Choice offered to:     DME Arranged:    DME Agency:     HH Arranged:    HH Agency:     Status of Service:  In process, will continue to follow  Medicare Important Message Given:  Yes Date Medicare IM Given:    Medicare IM give by:    Date Additional Medicare IM Given:    Additional Medicare Important Message give by:     If discussed at [place] of Stay Meetings, dates discussed:    Additional Comments:  Jolly Mango, RN 10/07/2015, 1:45 PM

## 2015-10-07 NOTE — Care Management Important Message (Signed)
Important Message  Patient Details  Name: Catherine Landry MRN: [id number] Date of Birth: 10/29/1935   Medicare Important Message Given:  Yes    Marlowe Lawes A, RN 10/07/2015, 8:11 AM

## 2015-10-07 NOTE — Progress Notes (Signed)
[place] at [place] NAME: Catherine Landry    MR#:  [id number]  DATE OF BIRTH:  May 16, 1936  SUBJECTIVE:    Patient subjectively feels about the same since admission. She continues assurance of breath and cough. She states she has had cough and shortness of breath for over 2 weeks. She has been on Z-Pak twice.  REVIEW OF SYSTEMS:    Review of Systems  Constitutional: Negative for fever, chills and malaise/fatigue.  HENT: Negative for sore throat.   Eyes: Negative for blurred vision.  Respiratory: Positive for cough, shortness of breath and wheezing. Negative for hemoptysis.   Cardiovascular: Negative for chest pain, palpitations and leg swelling.  Gastrointestinal: Negative for nausea, vomiting, abdominal pain, diarrhea and blood in stool.  Genitourinary: Negative for dysuria.  Musculoskeletal: Negative for back pain.  Neurological: Negative for dizziness, tremors and headaches.  Endo/Heme/Allergies: Does not bruise/bleed easily.    Tolerating Diet:yes      DRUG ALLERGIES:  No Known Allergies  VITALS:  Blood pressure 119/70, pulse 80, temperature 98.2 F (36.8 C), temperature source Oral, resp. rate 16, height 5\' 3"  (1.6 m), weight 64.456 kg (142 lb 1.6 oz), SpO2 91 %.  PHYSICAL EXAMINATION:   Physical Exam  Constitutional: She is oriented to person, place, and time and well-developed, well-nourished, and in no distress. No distress.  HENT:  Head: Normocephalic.  Eyes: No scleral icterus.  Neck: Normal range of motion. Neck supple. No JVD present. No tracheal deviation present.  Cardiovascular: Normal rate, regular rhythm and normal heart sounds.  Exam reveals no gallop and no friction rub.   No murmur heard. Pulmonary/Chest: Effort normal. No respiratory distress. She has wheezes. She has no rales. She exhibits no tenderness.  Tight fair air movement  Abdominal: Soft. Bowel sounds are normal. She exhibits no  distension and no mass. There is no tenderness. There is no rebound and no guarding.  Musculoskeletal: Normal range of motion. She exhibits no edema.  Neurological: She is alert and oriented to person, place, and time.  Skin: Skin is warm. No rash noted. No erythema.  Psychiatric: Affect and judgment normal.      LABORATORY PANEL:   CBC  Recent Labs Lab 10/07/15 0458  WBC 5.6  HGB 12.5  HCT 37.3  PLT 196   ------------------------------------------------------------------------------------------------------------------  Chemistries   Recent Labs Lab 10/07/15 0458  NA 137  K 5.0  CL 103  CO2 28  GLUCOSE 179*  BUN 23*  CREATININE 1.34*  CALCIUM 8.7*   ------------------------------------------------------------------------------------------------------------------  Cardiac Enzymes  Recent Labs Lab 10/06/15 0855  TROPONINI <0.03   ------------------------------------------------------------------------------------------------------------------  RADIOLOGY:  Dg Chest 2 View  10/06/2015  CLINICAL DATA:  Severe shortness of breath and cough being yesterday. EXAM: CHEST  2 VIEW COMPARISON:  08/06/2006 FINDINGS: Heart size is within normal limits. Both lungs are clear. Pulmonary hyperinflation and flattening of hemidiaphragms is seen, consistent with COPD. No evidence of pneumothorax or pleural effusion. Mild thoracic spine degenerative changes noted. IMPRESSION: Stable exam.  COPD.  No active disease. Electronically Signed   By: Earle Gell M.D.   On: 10/06/2015 10:14   Ct Angio Chest Pe W/cm &/or Wo Cm  10/06/2015  CLINICAL DATA:  Productive cough and congestion for 6 weeks. Short of breath last night. EXAM: CT ANGIOGRAPHY CHEST WITH CONTRAST TECHNIQUE: Multidetector CT imaging of the chest was performed using the standard protocol during bolus administration of intravenous contrast. Multiplanar CT image reconstructions and MIPs were  obtained to evaluate the vascular  anatomy. CONTRAST:  55mL OMNIPAQUE IOHEXOL 350 MG/ML SOLN COMPARISON:  09/25/2005 FINDINGS: There are no filling defects in the pulmonary arterial tree to suggest acute pulmonary thromboembolism. No evidence of aortic dissection or aneurysm. Minimal aortic valvular calcification. Three vessel coronary artery calcification. Small scattered mediastinal nodes. 2.0 x 2.9 cm irregular patchy density in the right upper lobe, on image 47, is characterized by irregular linear markings and ground-glass. Similar smaller density in the right upper lobe on image 59. Superimposed emphysema. No pneumothorax.  No pleural effusion. No vertebral compression deformity. Stable 1.5 cm right adrenal nodule. Stable asymmetric right breast tissue. Review of the MIP images confirms the above findings. IMPRESSION: No evidence of acute pulmonary thromboembolism 2.9 cm right upper lobe ground-glass and irregular pulmonary opacity. Differential diagnosis includes an inflammatory process versus adenoidal carcinoma. Initial follow-up by chest CT without contrast is recommended in 3 months to confirm persistence. This recommendation follows the consensus statement: Recommendations for the Management of Subsolid Pulmonary Nodules Detected at CT: A Statement from the [place] as published in Radiology 2013; 266:304-317. Electronically Signed   By: Marybelle Killings M.D.   On: 10/06/2015 12:51     ASSESSMENT AND PLAN:   80 year old female with a history of COPD not on oxygen who presents with shortness of breath and acute hypoxic respiratory failure.  1. Acute hypoxic respiratory failure: Patient will be treated for COPD exacerbation. CT chest does not show evidence of pneumonia or pulmonary emboli.  2. Acute COPD exacerbation: Continue steroids, nebulizer and inhaler. Patient is on theophylline which will be continued. Patient's lung examination still shows tightness with fair air movement. She will need to be continued IV  steroids. Continue Rocephin and Zithromax.   3. 2.9 cm Right upper lobe opacity: This could reflect pneumonia or lung mass. Patient is a former smoker. She needs repeat CT chest in 3 months to confirm persistence. This was discussed with the patient.  4. Community-acquired pneumonia: Continue Rocephin and azithromycin.  5. Essential hypertension: Blood pressure is relatively well controlled. Continue losartan and the Toprol.    Management plans discussed with the patient and she is in agreement.  CODE STATUS: FULL  TOTAL TIME TAKING CARE OF THIS PATIENT: 30 minutes.     POSSIBLE D/C 2-3 days, DEPENDING ON CLINICAL CONDITION.   Cletus Paris M.D on 10/07/2015 at 10:52 AM  Between 7am to 6pm - Pager - [phone] After 6pm go to [email or web address] - password [email or web address] [facility]  Office  [phone]  CC: Primary care physician; Tracie Harrier, MD  Note: This dictation was prepared with Dragon dictation along with smaller phrase technology. Any transcriptional errors that result from this process are unintentional.

## 2015-10-07 NOTE — Progress Notes (Signed)
SATURATION QUALIFICATIONS: (This note is used to comply with regulatory documentation for home oxygen)  Patient Saturations on Room Air at Rest = 91%  Patient Saturations on Room Air while Ambulating = 84%  Patient Saturations on 3.5 Liters of oxygen while Ambulating = 91%  Please briefly explain why patient needs home oxygen:

## 2015-10-08 LAB — GLUCOSE, CAPILLARY: GLUCOSE-CAPILLARY: 147 mg/dL — AB (ref 65–99)

## 2015-10-08 LAB — BASIC METABOLIC PANEL
ANION GAP: 6 (ref 5–15)
BUN: 23 mg/dL — ABNORMAL HIGH (ref 6–20)
CALCIUM: 8.6 mg/dL — AB (ref 8.9–10.3)
CHLORIDE: 103 mmol/L (ref 101–111)
CO2: 29 mmol/L (ref 22–32)
Creatinine, Ser: 1.17 mg/dL — ABNORMAL HIGH (ref 0.44–1.00)
GFR calc non Af Amer: 44 mL/min — ABNORMAL LOW (ref >60)
GFR, EST AFRICAN AMERICAN: 51 mL/min — AB (ref >60)
GLUCOSE: 172 mg/dL — AB (ref 65–99)
Potassium: 4.8 mmol/L (ref 3.5–5.1)
Sodium: 138 mmol/L (ref 135–145)

## 2015-10-08 MED ORDER — BENZONATATE 100 MG PO CAPS
200.0000 mg | ORAL_CAPSULE | Freq: Three times a day (TID) | ORAL | Status: DC
  Administered 2015-10-08 – 2015-10-12 (×13): 200 mg via ORAL
  Filled 2015-10-08 (×13): qty 2

## 2015-10-08 NOTE — Progress Notes (Signed)
[place] at [place] NAME: Catherine Landry    MR#:  [id number]  DATE OF BIRTH:  31-Jan-1936  SUBJECTIVE:  CHIEF COMPLAINT:   Chief Complaint  Patient presents with  . Shortness of Breath   - Breathing still about the same. Worsening cough. -Requiring 3.5 L of oxygen on exertion. Remains hypoxic.  REVIEW OF SYSTEMS:  Review of Systems  Constitutional: Negative for fever and chills.  HENT: Negative for ear discharge, ear pain and nosebleeds.   Eyes: Negative for blurred vision and double vision.  Respiratory: Positive for cough, sputum production, shortness of breath and wheezing.   Cardiovascular: Negative for chest pain and palpitations.  Gastrointestinal: Negative for nausea, vomiting, abdominal pain, diarrhea and constipation.  Genitourinary: Negative for dysuria and frequency.  Musculoskeletal: Negative for myalgias.  Neurological: Negative for dizziness, tingling, speech change, focal weakness, seizures, weakness and headaches.  Psychiatric/Behavioral: Negative for depression.    DRUG ALLERGIES:  No Known Allergies  VITALS:  Blood pressure 114/54, pulse 106, temperature 97.2 F (36.2 C), temperature source Oral, resp. rate 19, height 5\' 3"  (1.6 m), weight 64.864 kg (143 lb), SpO2 96 %.  PHYSICAL EXAMINATION:  Physical Exam  GENERAL:  80 y.o.-year-old patient lying in the bed with no acute distress.  EYES: Pupils equal, round, reactive to light and accommodation. No scleral icterus. Extraocular muscles intact.  HEENT: Head atraumatic, normocephalic. Oropharynx and nasopharynx clear.  NECK:  Supple, no jugular venous distention. No thyroid enlargement, no tenderness.  LUNGS: Moving air bilaterally today, occasional scattered wheezing noted. Decreased bibasilar breath sounds. No real sore rhonchi or crackles. No use of accessory muscles for breathing at rest.  CARDIOVASCULAR: S1, S2 normal. No rubs, or gallops. 3/6 systolic  murmur present ABDOMEN: Soft, nontender, nondistended. Bowel sounds present. No organomegaly or mass.  EXTREMITIES: No pedal edema, cyanosis, or clubbing.  NEUROLOGIC: Cranial nerves II through XII are intact. Muscle strength 5/5 in all extremities. Sensation intact. Gait not checked.  PSYCHIATRIC: The patient is alert and oriented x 3.  SKIN: No obvious rash, lesion, or ulcer.    LABORATORY PANEL:   CBC  Recent Labs Lab 10/07/15 0458  WBC 5.6  HGB 12.5  HCT 37.3  PLT 196   ------------------------------------------------------------------------------------------------------------------  Chemistries   Recent Labs Lab 10/07/15 0458  NA 137  K 5.0  CL 103  CO2 28  GLUCOSE 179*  BUN 23*  CREATININE 1.34*  CALCIUM 8.7*   ------------------------------------------------------------------------------------------------------------------  Cardiac Enzymes  Recent Labs Lab 10/06/15 0855  TROPONINI <0.03   ------------------------------------------------------------------------------------------------------------------  RADIOLOGY:  Ct Angio Chest Pe W/cm &/or Wo Cm  10/06/2015  CLINICAL DATA:  Productive cough and congestion for 6 weeks. Short of breath last night. EXAM: CT ANGIOGRAPHY CHEST WITH CONTRAST TECHNIQUE: Multidetector CT imaging of the chest was performed using the standard protocol during bolus administration of intravenous contrast. Multiplanar CT image reconstructions and MIPs were obtained to evaluate the vascular anatomy. CONTRAST:  79mL OMNIPAQUE IOHEXOL 350 MG/ML SOLN COMPARISON:  09/25/2005 FINDINGS: There are no filling defects in the pulmonary arterial tree to suggest acute pulmonary thromboembolism. No evidence of aortic dissection or aneurysm. Minimal aortic valvular calcification. Three vessel coronary artery calcification. Small scattered mediastinal nodes. 2.0 x 2.9 cm irregular patchy density in the right upper lobe, on image 47, is characterized by  irregular linear markings and ground-glass. Similar smaller density in the right upper lobe on image 59. Superimposed emphysema. No pneumothorax.  No pleural effusion. No vertebral  compression deformity. Stable 1.5 cm right adrenal nodule. Stable asymmetric right breast tissue. Review of the MIP images confirms the above findings. IMPRESSION: No evidence of acute pulmonary thromboembolism 2.9 cm right upper lobe ground-glass and irregular pulmonary opacity. Differential diagnosis includes an inflammatory process versus adenoidal carcinoma. Initial follow-up by chest CT without contrast is recommended in 3 months to confirm persistence. This recommendation follows the consensus statement: Recommendations for the Management of Subsolid Pulmonary Nodules Detected at CT: A Statement from the [place] as published in Radiology 2013; 266:304-317. Electronically Signed   By: Marybelle Killings M.D.   On: 10/06/2015 12:51    EKG:   Orders placed or performed in visit on 07/11/13  . EKG 12-Lead    ASSESSMENT AND PLAN:   80 year old female with a history of COPD not on oxygen who presents with shortness of breath and acute hypoxic respiratory failure.  1. Acute hypoxic respiratory failure: Secondary to COPD exacerbation. CT chest does not show evidence of  pulmonary emboli. -Continue IV Solu-Medrol twice a day, also on DuoNeb nebs every 6 hours. -Continue theophylline and dulera inhaler. -Cough medicines added. Flutter valve and incentive spirometry recommended. -Currently requiring 3-3.5 L oxygen on exertion. Continue to wean as tolerated.   2. Acute COPD exacerbation: Management as above -For acute bronchitis-continue Rocephin and azithromycin.  3. Right upper lobe opacity: This could reflect a lung mass. Patient is a former smoker. She needs repeat CT chest in 3 months to confirm persistence. This was discussed with the patient.  4. Community-acquired pneumonia: Continue Rocephin and  azithromycin.  5. Essential hypertension: Blood pressure is relatively well controlled. Continue losartan and the Toprol.  6. DVT Prophylaxis- on lovenox   All the records are reviewed and case discussed with Care Management/Social Workerr. Management plans discussed with the patient, family and they are in agreement.  CODE STATUS: Full Code  TOTAL TIME TAKING CARE OF THIS PATIENT: 38 minutes.   POSSIBLE D/C IN 2-3 DAYS, DEPENDING ON CLINICAL CONDITION.   Gladstone Lighter M.D on 10/08/2015 at 10:38 AM  Between 7am to 6pm - Pager - [phone]  After 6pm go to [email or web address] - password [email or web address] [facility]  Office  [phone]  CC: Primary care physician; Tracie Harrier, MD

## 2015-10-09 ENCOUNTER — Inpatient Hospital Stay: Payer: Medicare Other

## 2015-10-09 LAB — GLUCOSE, CAPILLARY: GLUCOSE-CAPILLARY: 143 mg/dL — AB (ref 65–99)

## 2015-10-09 MED ORDER — GUAIFENESIN-CODEINE 100-10 MG/5ML PO SOLN
5.0000 mL | Freq: Three times a day (TID) | ORAL | Status: AC
  Administered 2015-10-09 – 2015-10-11 (×6): 5 mL via ORAL
  Filled 2015-10-09 (×6): qty 5

## 2015-10-09 MED ORDER — HYDROCOD POLST-CPM POLST ER 10-8 MG/5ML PO SUER
5.0000 mL | Freq: Two times a day (BID) | ORAL | Status: DC | PRN
  Administered 2015-10-09: 5 mL via ORAL
  Filled 2015-10-09: qty 5

## 2015-10-09 NOTE — Care Management Important Message (Signed)
Important Message  Patient Details  Name: Catherine Landry MRN: [id number] Date of Birth: [birth year]   Medicare Important Message Given:  Yes    Juliann Pulse A Dalyce Renne 10/09/2015, 11:26 AM

## 2015-10-09 NOTE — Progress Notes (Signed)
SATURATION QUALIFICATIONS: (This note is used to comply with regulatory documentation for home oxygen)  Patient Saturations on Room Air at Rest =84%  Patient Saturations on Room Air while Ambulating = 80%  Patient Saturations on 2Liters of oxygen while Ambulating = 88%  Please briefly explain why patient needs home oxygen:

## 2015-10-09 NOTE — Progress Notes (Signed)
A & O. Up to chair and tolerated it well. 3 L of oxygen. NSR. Pt reports no pain.Fs are stable. 1 assist with walker. D/c 1/19 with HH. Husband at the bedside. FS are stable. Pt has no further concerns at this time.

## 2015-10-09 NOTE — Evaluation (Signed)
Physical Therapy Evaluation Patient Details Name: Catherine Landry MRN: [id number] DOB: 12-May-1936 Today's Date: 10/09/2015   History of Present Illness  Catherine Landry is a 80 y.o. female with a known history of COPD, recently received oneround of antibiotics for bronchitis, prednisone comes in because of persistent shortness of breath, cough. Started to have shortness of breath about 2 weeks ago. Still having shortness of breath, cough. Started second round of azithromycin yesterday. Cough with green phlegm. Chest tightness and wheezing. No reported falls in the last 12 months.   Clinical Impression  Pt demonstrates decreased cardiopulmonary endurance during ambulation. SaO2 drops on 3L/min (see gait section) and pt requires 4L/min supplemental O2 to maintain saturation values within acceptable limits during rest of ambulation. She presents with higher level balance deficits but likely due to weakness from prolonged bedrest. She would benefit from referral for pulmonary rehab after discharge to improve lung function and decrease risk for readmissions. Pt will benefit from skilled PT services to address deficits in strength, balance, and mobility in order to return to full function at home.     Follow Up Recommendations No PT follow up;Other (comment) (Pulmonary rehab per MD referral)    Equipment Recommendations  None recommended by PT    Recommendations for Other Services       Precautions / Restrictions Precautions Precautions: Fall Restrictions Weight Bearing Restrictions: No      Mobility  Bed Mobility               General bed mobility comments: Received upright in recliner  Transfers Overall transfer level: Needs assistance Equipment used: None Transfers: Sit to/from Stand Sit to Stand: Min guard         General transfer comment: Pt demonstrates good speed and sequencing with sit to stand transfers. No assistive device required. Safe hand placement and good  balance once upright in standing  Ambulation/Gait Ambulation/Gait assistance: Min guard Ambulation Distance (Feet): 250 Feet Assistive device: None Gait Pattern/deviations: WFL(Within Functional Limits) Gait velocity: Decreased Gait velocity interpretation: Below normal speed for age/gender General Gait Details: Pt demonstrates good step length and gait speed. She is able to perform horizontal and vertical head turns with minimal instability noted.  SaO2 checked at rest on room air and found to be 85%. O2 applied at 3L/min and SaO2 recovers to >90%. During ambulation on 3L/min SaO2 drops to 81%. Pt still does not report DOE. O2 increased to 4L/min and SaO2 recovers to 91% and remains at or above 90% during rest of ambulation distance. Pt provided multiple standing rest breaks and fatigue monitored  Stairs            Wheelchair Mobility    Modified Rankin (Stroke Patients Only)       Balance Overall balance assessment: Needs assistance Sitting-balance support: No upper extremity supported Sitting balance-Leahy Scale: Normal     Standing balance support: No upper extremity supported Standing balance-Leahy Scale: Good Standing balance comment: Positive Rhomberg, single leg stance less than 2 seconds                             Pertinent Vitals/Pain Pain Assessment: No/denies pain    Home Living Family/patient expects to be discharged to:: Private residence Living Arrangements: Spouse/significant other Available Help at Discharge: Family Type of Home: House Home Access: Stairs to enter Entrance Stairs-Rails: None Entrance Stairs-Number of Steps: 2 Home Layout: One level Home Equipment: Grab bars - toilet;Shower seat -  built in      Prior Function Level of Independence: Independent         Comments: Independent with ADLs/IADLs. Pt drives and is a full community ambulator without assistive device. Pt reports intermittent DOE     Hand Dominance    Dominant Hand: Right    Extremity/Trunk Assessment   Upper Extremity Assessment: Overall WFL for tasks assessed           Lower Extremity Assessment: Overall WFL for tasks assessed         Communication   Communication: No difficulties  Cognition Arousal/Alertness: Awake/alert Behavior During Therapy: WFL for tasks assessed/performed Overall Cognitive Status: Within Functional Limits for tasks assessed                      General Comments      Exercises        Assessment/Plan    PT Assessment Patient needs continued PT services  PT Diagnosis Difficulty walking;Generalized weakness   PT Problem List Decreased strength;Decreased activity tolerance;Decreased balance;Decreased mobility;Cardiopulmonary status limiting activity  PT Treatment Interventions Gait training;Stair training;Functional mobility training;Therapeutic activities;Therapeutic exercise;Balance training;Neuromuscular re-education;Patient/family education   PT Goals (Current goals can be found in the Care Plan section) Acute Rehab PT Goals Patient Stated Goal: "I want to get back home." PT Goal Formulation: With patient/family Time For Goal Achievement: 10/23/15 Potential to Achieve Goals: Good    Frequency Min 2X/week   Barriers to discharge        Co-evaluation               End of Session Equipment Utilized During Treatment: Gait belt Activity Tolerance: Patient tolerated treatment well Patient left: in bed;with call bell/phone within reach;with bed alarm set;with family/visitor present (With RT receiving breathing treatment)           Time: 1310-1340 PT Time Calculation (min) (ACUTE ONLY): 30 min   Charges:   PT Evaluation $PT Eval Moderate Complexity: 1 Procedure PT Treatments $Gait Training: 8-22 mins   PT G Codes:       Lyndel Safe Dionna Wiedemann PT, DPT   Eldine Rencher 10/09/2015, 2:03 PM

## 2015-10-09 NOTE — Care Management Note (Signed)
Case Management Note  Patient Details  Name: Catherine Landry MRN: [id number] Date of Birth: [birth year]  Subjective/Objective:  Pt would like to go to pulmonary rehab and not have home health nurse. PT recommending no PT follow up                  Action/Plan:   Expected Discharge Date:                  Expected Discharge Plan:  Home/Self Care  In-House Referral:     Discharge planning Services  CM Consult  Post Acute Care Choice:    Choice offered to:     DME Arranged:    DME Agency:     HH Arranged:    HH Agency:     Status of Service:  In process, will continue to follow  Medicare Important Message Given:  Yes Date Medicare IM Given:    Medicare IM give by:    Date Additional Medicare IM Given:    Additional Medicare Important Message give by:     If discussed at [place] of Stay Meetings, dates discussed:    Additional Comments:  Jolly Mango, RN 10/09/2015, 2:39 PM

## 2015-10-09 NOTE — Progress Notes (Signed)
[place] [facility] [place] NAME: Catherine Landry    MR#:  [id number]  DATE OF BIRTH:  09-15-1936  SUBJECTIVE:  CHIEF COMPLAINT:   Chief Complaint  Patient presents with  . Shortness of Breath   - remains on 3L o2 this morning, had a bad coughing spell last night - breathing is slowly improving  REVIEW OF SYSTEMS:  Review of Systems  Constitutional: Negative for fever and chills.  HENT: Negative for ear discharge, ear pain and nosebleeds.   Eyes: Negative for blurred vision and double vision.  Respiratory: Positive for cough, sputum production, shortness of breath and wheezing.   Cardiovascular: Negative for chest pain and palpitations.  Gastrointestinal: Negative for nausea, vomiting, abdominal pain, diarrhea and constipation.  Genitourinary: Negative for dysuria and frequency.  Musculoskeletal: Negative for myalgias.  Neurological: Negative for dizziness, tingling, speech change, focal weakness, seizures, weakness and headaches.  Psychiatric/Behavioral: Negative for depression.    DRUG ALLERGIES:  No Known Allergies  VITALS:  Blood pressure 157/80, pulse 84, temperature 98.1 F (36.7 C), temperature source Oral, resp. rate 20, height 5\' 3"  (1.6 m), weight 64.51 kg (142 lb 3.5 oz), SpO2 95 %.  PHYSICAL EXAMINATION:  Physical Exam  GENERAL:  80 y.o.-year-old patient lying in the bed with no acute distress.  EYES: Pupils equal, round, reactive to light and accommodation. No scleral icterus. Extraocular muscles intact.  HEENT: Head atraumatic, normocephalic. Oropharynx and nasopharynx clear.  NECK:  Supple, no jugular venous distention. No thyroid enlargement, no tenderness.  LUNGS: Moving air bilaterally, occasional scattered wheezing noted. Decreased bibasilar breath sounds. No rales, rhonchi or crackles. No use of accessory muscles for breathing at rest.  CARDIOVASCULAR: S1, S2 normal. No rubs, or gallops. 3/6 systolic murmur  present ABDOMEN: Soft, nontender, nondistended. Bowel sounds present. No organomegaly or mass.  EXTREMITIES: No pedal edema, cyanosis, or clubbing.  NEUROLOGIC: Cranial nerves II through XII are intact. Muscle strength 5/5 in all extremities. Sensation intact. Gait not checked.  PSYCHIATRIC: The patient is alert and oriented x 3.  SKIN: No obvious rash, lesion, or ulcer.    LABORATORY PANEL:   CBC  Recent Labs Lab 10/07/15 0458  WBC 5.6  HGB 12.5  HCT 37.3  PLT 196   ------------------------------------------------------------------------------------------------------------------  Chemistries   Recent Labs Lab 10/08/15 1109  NA 138  K 4.8  CL 103  CO2 29  GLUCOSE 172*  BUN 23*  CREATININE 1.17*  CALCIUM 8.6*   ------------------------------------------------------------------------------------------------------------------  Cardiac Enzymes  Recent Labs Lab 10/06/15 0855  TROPONINI <0.03   ------------------------------------------------------------------------------------------------------------------  RADIOLOGY:  Dg Chest 2 View  10/09/2015  CLINICAL DATA:  Shortness of breath, COPD, pneumonia EXAM: CHEST  2 VIEW COMPARISON:  10/06/2015 FINDINGS: There is hyperinflation of the lungs compatible with COPD. Heart is upper limits normal in size. No confluent airspace opacities or effusions. No acute bony abnormality. IMPRESSION: Stable COPD.  No active disease. Electronically Signed   By: Rolm Baptise M.D.   On: 10/09/2015 08:18    EKG:   Orders placed or performed in visit on 07/11/13  . EKG 12-Lead    ASSESSMENT AND PLAN:   80 year old female with a history of COPD not on oxygen who presents with shortness of breath and acute hypoxic respiratory failure.  1. Acute hypoxic respiratory failure: Secondary to COPD exacerbation. CT chest does not show evidence of  pulmonary emboli. -Continue IV Solu-Medrol twice a day, also on DuoNeb nebs every 6  hours. -Continue theophylline  and dulera inhaler. -Cough medicines added. Flutter valve and incentive spirometry recommended. -Currently requiring 3 L oxygen on exertion. Continue to wean as tolerated.   2. Acute COPD exacerbation: Management as above -For acute bronchitis-continue Rocephin and azithromycin.  3. Right upper lobe opacity:  Patient is a former smoker. Also has bladder cancer. She needs repeat CT chest in 3 months to confirm persistence. This was discussed with the patient. - last CT chest in oct 2016 showed  <94mm pulm nodules which are stable.  4. Community-acquired pneumonia: Continue Rocephin and azithromycin.  5. Essential hypertension: Blood pressure is relatively well controlled. Continue losartan and the Toprol. Watch the potassium  6. DVT Prophylaxis- on lovenox  Physical therapy consulted  All the records are reviewed and case discussed with Care Management/Social Workerr. Management plans discussed with the patient, family and they are in agreement.  CODE STATUS: Full Code  TOTAL TIME TAKING CARE OF THIS PATIENT: 38 minutes.   POSSIBLE D/C IN 1-2 DAYS, DEPENDING ON CLINICAL CONDITION.   Gladstone Lighter M.D on 10/09/2015 at 1:30 PM  Between 7am to 6pm - Pager - [phone]  After 6pm go to [email or web address] - password [email or web address] [facility]  Office  [phone]  CC: Primary care physician; Tracie Harrier, MD

## 2015-10-10 ENCOUNTER — Inpatient Hospital Stay: Payer: Medicare Other

## 2015-10-10 DIAGNOSIS — J441 Chronic obstructive pulmonary disease with (acute) exacerbation: Secondary | ICD-10-CM

## 2015-10-10 MED ORDER — IPRATROPIUM-ALBUTEROL 0.5-2.5 (3) MG/3ML IN SOLN
3.0000 mL | Freq: Three times a day (TID) | RESPIRATORY_TRACT | Status: DC
  Administered 2015-10-11 – 2015-10-12 (×5): 3 mL via RESPIRATORY_TRACT
  Filled 2015-10-10 (×5): qty 3

## 2015-10-10 NOTE — Plan of Care (Signed)
Problem: Activity: Goal: Risk for activity intolerance will decrease Outcome: 80 Ambulates with assist.      Problem: Respiratory: Goal: Ability to maintain a clear airway will improve Outcome: Progressing Continues of 02 at 2L per [place].  Problem: Pain Management: Goal: Expressions of feelings of enhanced comfort will increase Outcome: Progressing No complaints of pain.

## 2015-10-10 NOTE — Discharge Planning (Signed)
Patient SPO2 94% on RA at rest. Patient ambulated 1010ft on RA and SPO2 fell to 84%. Patient needed 6L [place] for SPO2 to maintain above 88% with ambulation/activity. Pt will need Oxygen at home for ambulation/activity  if discharged within next 24 hours.

## 2015-10-10 NOTE — Progress Notes (Signed)
[place] at [place] NAME: Catherine Landry    MR#:  [id number]  DATE OF BIRTH:  1936/04/16  SUBJECTIVE:  CHIEF COMPLAINT:   Chief Complaint  Patient presents with  . Shortness of Breath   - On ambulating her saturations dropped to 84% requiring up to 6 L of oxygen to bring them up to 90%. -Scattered wheezing still present and having coughing spells occasionally.  REVIEW OF SYSTEMS:  Review of Systems  Constitutional: Negative for fever and chills.  HENT: Negative for ear discharge, ear pain and nosebleeds.   Eyes: Negative for blurred vision and double vision.  Respiratory: Positive for cough, sputum production, shortness of breath and wheezing.   Cardiovascular: Negative for chest pain and palpitations.  Gastrointestinal: Negative for nausea, vomiting, abdominal pain, diarrhea and constipation.  Genitourinary: Negative for dysuria and frequency.  Musculoskeletal: Negative for myalgias.  Neurological: Negative for dizziness, tingling, speech change, focal weakness, seizures, weakness and headaches.  Psychiatric/Behavioral: Negative for depression.    DRUG ALLERGIES:  No Known Allergies  VITALS:  Blood pressure 148/84, pulse 82, temperature 98.1 F (36.7 C), temperature source Oral, resp. rate 18, height 5\' 3"  (1.6 m), weight 73.165 kg (161 lb 4.8 oz), SpO2 88 %.  PHYSICAL EXAMINATION:  Physical Exam  GENERAL:  80 y.o.-year-old patient lying in the bed with no acute distress.  EYES: Pupils equal, round, reactive to light and accommodation. No scleral icterus. Extraocular muscles intact.  HEENT: Head atraumatic, normocephalic. Oropharynx and nasopharynx clear.  NECK:  Supple, no jugular venous distention. No thyroid enlargement, no tenderness.  LUNGS: Moving air bilaterally, occasional scattered wheezing noted. Decreased bibasilar breath sounds. No rales, rhonchi or crackles. No use of accessory muscles for breathing at rest.   CARDIOVASCULAR: S1, S2 normal. No rubs, or gallops. 3/6 systolic murmur present ABDOMEN: Soft, nontender, nondistended. Bowel sounds present. No organomegaly or mass.  EXTREMITIES: No pedal edema, cyanosis, or clubbing.  NEUROLOGIC: Cranial nerves II through XII are intact. Muscle strength 5/5 in all extremities. Sensation intact. Gait not checked.  PSYCHIATRIC: The patient is alert and oriented x 3.  SKIN: No obvious rash, lesion, or ulcer.    LABORATORY PANEL:   CBC  Recent Labs Lab 10/07/15 0458  WBC 5.6  HGB 12.5  HCT 37.3  PLT 196   ------------------------------------------------------------------------------------------------------------------  Chemistries   Recent Labs Lab 10/08/15 1109  NA 138  K 4.8  CL 103  CO2 29  GLUCOSE 172*  BUN 23*  CREATININE 1.17*  CALCIUM 8.6*   ------------------------------------------------------------------------------------------------------------------  Cardiac Enzymes  Recent Labs Lab 10/06/15 0855  TROPONINI <0.03   ------------------------------------------------------------------------------------------------------------------  RADIOLOGY:  Dg Chest 2 View  10/09/2015  CLINICAL DATA:  Shortness of breath, COPD, pneumonia EXAM: CHEST  2 VIEW COMPARISON:  10/06/2015 FINDINGS: There is hyperinflation of the lungs compatible with COPD. Heart is upper limits normal in size. No confluent airspace opacities or effusions. No acute bony abnormality. IMPRESSION: Stable COPD.  No active disease. Electronically Signed   By: Rolm Baptise M.D.   On: 10/09/2015 08:18    EKG:   Orders placed or performed in visit on 07/11/13  . EKG 12-Lead    ASSESSMENT AND PLAN:   80 year old female with a history of COPD not on oxygen who presents with shortness of breath and acute hypoxic respiratory failure.  1. Acute hypoxic respiratory failure: Secondary to COPD exacerbation. CT chest done on admission does not show evidence of   pulmonary emboli. -on  IV Solu-Medrol twice a day, also on DuoNeb nebs. -Continue theophylline and dulera inhaler. -Cough medicines added. Flutter valve and incentive spirometry recommended. -Currently requiring 3 L oxygenat rest. Patient was not on any home oxygen. On exertion, today she dropped down to 84% on 3 L requiring up to 6 L to bring her saturations back to 90%. -We will get a CT chest without contrast today and also pulmonary consult.  2. Acute COPD exacerbation: Management as above -For acute bronchitis-continue Rocephin and azithromycin.  3. Right upper lobe opacity:  Patient is a former smoker. Also has bladder cancer. She needs repeat CT chest in 3 months to confirm persistence. This was discussed with the patient. - last CT chest in oct 2016 showed  <27mm pulm nodules which are stable. -repeat CT chest being followed up today   4. Essential hypertension: Blood pressure is relatively well controlled. Continue losartan and Toprol. monitor potassium  6. DVT Prophylaxis- on lovenox  Physical therapy consulted-recommended home health  All the records are reviewed and case discussed with Care Management/Social Workerr. Management plans discussed with the patient, family and they are in agreement.  CODE STATUS: Full Code  TOTAL TIME TAKING CARE OF THIS PATIENT: 42 minutes.   POSSIBLE D/C IN 1-2 DAYS, DEPENDING ON CLINICAL CONDITION.   Gladstone Lighter M.D on 10/10/2015 at 12:59 PM  Between 7am to 6pm - Pager - [phone]  After 6pm go to [email or web address] - password [email or web address] [facility]  Office  [phone]  CC: Primary care physician; Tracie Harrier, MD

## 2015-10-10 NOTE — Consult Note (Signed)
[facility] Consultation      Assessment and Plan:  Acute exacerbation of COPD with acute bronchitis. -Continue Dulera, she feels that this seems to work better than her Advair at home. -Complete course of antibiotics and steroids orally. Can continue theophylline. -The patient was given my card and asked to follow with [place] outpatient in 3-4 weeks. At that time we will be obtaining a lung function testing and see if we can wean the patient off of her oxygen if she is discharged with oxygen. -Patient appears to be significantly improved, okay for discharge from respiratory standpoint.  Pneumonia. -This was seen on the initial CT scan at the time of admission, subsequent CT scan showed that this area of pneumonia versus scar appears to have resolved.  Lung nodule. -Small nodule in the right upper lobe, this can be followed up outpatient.  Date: 10/10/2015  MRN# [id number] Catherine Landry 04-28-1936  Referring Physician: Dr. Dorena Bodo [name] is a 80 y.o. old female seen in consultation for chief complaint of:    Chief Complaint  Patient presents with  . Shortness of Breath    HPI:    81 yo female with history of HTN, COPD, Bladder Ca and Gout. She presented with increasing dyspnea over the past few weeks. Subsequently she received an outpatient course of antibiotic without resolution, and presented to the hospital. She is been treated patient with IV steroids and antibiotics, she is feeling better than at the time of admission, however, she still notes dyspnea. The patient notes that she has remained fairly active over the years, she is able to walk in a supermarket. She cleans her home and is functionally independent, she lives at home with her husband. She is a previous smoker, smoked about one half packs per day and stopped about 15 years ago. She worked in a Research officer, trade union.  CT chest in April 2016 Showed 0.7 cmm lung nodule in superior segment of  Rt lower lobe  CT chest images and report from 10/10/2015 and compared with previous from from 10/06/2015. There appeared to be a small right upper lobe nodule, as well as a small area of scarring/pneumonia which appeared to have resolved and the subsequent CT, although the nodule is still present. We'll CT shows significant bilateral emphysema. There is also streaky atelectasis seen in the left base.  PMHX:   Past Medical History  Diagnosis Date  . Hypertension   . Renal insufficiency   . Asthma   . Cancer [facility])    Surgical Hx:  Past Surgical History  Procedure Laterality Date  . Back surgery     Family Hx:  History reviewed. No pertinent family history. Social Hx:   Social History  Substance Use Topics  . Smoking status: Former Smoker    Quit date: 10/10/2014  . Smokeless tobacco: Never Used  . Alcohol Use: No   Medication:   No current outpatient prescriptions on file.    Allergies:  Review of patient's allergies indicates no known allergies.  Review of Systems: Gen:  Denies  fever, sweats, chills HEENT: Denies blurred vision, double vision. bleeds Cvc:  No dizziness, chest pain. Resp:   Denies cough or sputum porduction,  Gi: Denies swallowing difficulty, stomach pain. Gu:  Denies bladder incontinence, burning urine Ext:   No Joint pain, stiffness. Skin: No skin rash,  hives Endoc:  No polyuria, polydipsia. Psych: No depression, insomnia. Other:  All other systems were reviewed with the patient and were  negative other that what is mentioned in the HPI.   Physical Examination:   VS: BP 123/57 mmHg  Pulse 94  Temp(Src) 97.3 F (36.3 C) (Oral)  Resp 18  Ht 5\' 3"  (1.6 m)  Wt 161 lb 4.8 oz (73.165 kg)  BMI 28.58 kg/m2  SpO2 91%  General Appearance: No distress  Neuro:without focal findings,  speech normal,  HEENT: PERRLA, EOM intact.   Pulmonary: normal breath sounds, No wheezing.  CardiovascularNormal S1,S2.  No m/r/g.   Abdomen: Benign, Soft,  non-tender. Renal:  No costovertebral tenderness  GU:  No performed at this time. Endoc: No evident thyromegaly, no signs of acromegaly. Skin:   warm, no rashes, no ecchymosis  Extremities: normal, no cyanosis, clubbing.  Other findings:    LABORATORY PANEL:   CBC  Recent Labs Lab 10/07/15 0458  WBC 5.6  HGB 12.5  HCT 37.3  PLT 196   ------------------------------------------------------------------------------------------------------------------  Chemistries   Recent Labs Lab 10/08/15 1109  NA 138  K 4.8  CL 103  CO2 29  GLUCOSE 172*  BUN 23*  CREATININE 1.17*  CALCIUM 8.6*   ------------------------------------------------------------------------------------------------------------------  Cardiac Enzymes  Recent Labs Lab 10/06/15 0855  TROPONINI <0.03   ------------------------------------------------------------  RADIOLOGY:  Dg Chest 2 View  10/09/2015  CLINICAL DATA:  Shortness of breath, COPD, pneumonia EXAM: CHEST  2 VIEW COMPARISON:  10/06/2015 FINDINGS: There is hyperinflation of the lungs compatible with COPD. Heart is upper limits normal in size. No confluent airspace opacities or effusions. No acute bony abnormality. IMPRESSION: Stable COPD.  No active disease. Electronically Signed   By: Rolm Baptise M.D.   On: 10/09/2015 08:18   Ct Chest Wo Contrast  10/10/2015  CLINICAL DATA:  Cough, shortness of breath. EXAM: CT CHEST WITHOUT CONTRAST TECHNIQUE: Multidetector CT imaging of the chest was performed following the standard protocol without IV contrast. COMPARISON:  Chest radiograph of October 09, 2015; CT scan of October 06, 2015. FINDINGS: Multilevel degenerative disc disease is noted in the thoracic spine. No pneumothorax or pleural effusion is noted. Stable biapical scarring is noted. Ground-glass opacity seen in right upper lobe on prior exam has nearly resolved, most consistent with focal inflammation. Stable 5 mm irregular nodular density is  noted laterally in right upper lobe, best seen on image number 25 of series 3. No other significant pulmonary parenchymal abnormality is noted. Atherosclerosis of thoracic aorta is noted without aneurysm formation. Coronary artery calcifications are noted. No definite mediastinal mass or adenopathy is seen on these unenhanced images. Stable right adrenal adenoma is noted. IMPRESSION: Ground-glass opacity seen in right upper lobe on prior exam is nearly resolved, most consistent with improving inflammation. Coronary artery calcifications are noted consistent with coronary artery disease. Stable 5 mm irregular nodular density is noted laterally in right upper lobe. If the patient is at high risk for bronchogenic carcinoma, follow-up chest CT at 6-12 months is recommended. If the patient is at low risk for bronchogenic carcinoma, follow-up chest CT at 12 months is recommended. This recommendation follows the consensus statement: Guidelines for Management of Small Pulmonary Nodules Detected on CT Scans: A Statement from the [place] as published in Radiology 2005;237:395-400. Electronically Signed   By: Marijo Conception, M.D.   On: 10/10/2015 13:48       Thank  you for the consultation and for allowing [facility] to assist in the care of your patient. Our recommendations are noted above.  Please contact us if we can be  of further service.   Marda Stalker, MD.  Board Certified in Internal Medicine, Pulmonary Medicine, [place], and Sleep Medicine.  [facility] Office Number: [phone]  Patricia Pesa, M.D.  Vilinda Boehringer, M.D.  Merton Border, M.D

## 2015-10-10 NOTE — Plan of Care (Signed)
Dr informed of pt needing 6L to ambulate and maintain SPO2 above 90%.  Resp will be consulted and Rad coming to re-scan lungs.

## 2015-10-11 LAB — BASIC METABOLIC PANEL
Anion gap: 7 (ref 5–15)
BUN: 29 mg/dL — AB (ref 6–20)
CO2: 30 mmol/L (ref 22–32)
Calcium: 8.5 mg/dL — ABNORMAL LOW (ref 8.9–10.3)
Chloride: 97 mmol/L — ABNORMAL LOW (ref 101–111)
Creatinine, Ser: 1.13 mg/dL — ABNORMAL HIGH (ref 0.44–1.00)
GFR, EST AFRICAN AMERICAN: 53 mL/min — AB (ref >60)
GFR, EST NON AFRICAN AMERICAN: 46 mL/min — AB (ref >60)
Glucose, Bld: 135 mg/dL — ABNORMAL HIGH (ref 65–99)
POTASSIUM: 4.5 mmol/L (ref 3.5–5.1)
SODIUM: 134 mmol/L — AB (ref 135–145)

## 2015-10-11 LAB — CBC
HCT: 37.7 % (ref 35.0–47.0)
Hemoglobin: 12.4 g/dL (ref 12.0–16.0)
MCH: 32 pg (ref 26.0–34.0)
MCHC: 32.9 g/dL (ref 32.0–36.0)
MCV: 97.3 fL (ref 80.0–100.0)
PLATELETS: 265 10*3/uL (ref 150–440)
RBC: 3.87 MIL/uL (ref 3.80–5.20)
RDW: 14.6 % — ABNORMAL HIGH (ref 11.5–14.5)
WBC: 7.5 10*3/uL (ref 3.6–11.0)

## 2015-10-11 MED ORDER — HYDRALAZINE HCL 20 MG/ML IJ SOLN: 10.0000 mg | Freq: Four times a day (QID) | INTRAMUSCULAR | Status: DC | PRN

## 2015-10-11 MED ORDER — PREDNISONE 50 MG PO TABS
60.0000 mg | ORAL_TABLET | Freq: Every day | ORAL | Status: DC
  Administered 2015-10-12: 09:00:00 60 mg via ORAL
  Filled 2015-10-11: qty 1

## 2015-10-11 MED ORDER — BISACODYL 5 MG PO TBEC
5.0000 mg | DELAYED_RELEASE_TABLET | Freq: Every day | ORAL | Status: DC | PRN
  Administered 2015-10-11: 5 mg via ORAL
  Filled 2015-10-11: qty 1

## 2015-10-11 NOTE — Care Management Important Message (Signed)
Important Message  Patient Details  Name: Catherine Landry MRN: [id number] Date of Birth: 08-10-1936   Medicare Important Message Given:  Yes    [name] 10/11/2015, 10:08 AM

## 2015-10-11 NOTE — Progress Notes (Signed)
Physical Therapy Treatment Patient Details Name: Catherine Landry MRN: [id number] DOB: 10/26/1935 Today's Date: 10/11/2015    History of Present Illness Catherine Landry is a 80 y.o. female with a known history of COPD, recently received oneround of antibiotics for bronchitis, prednisone comes in because of persistent shortness of breath, cough. Started to have shortness of breath about 2 weeks ago. Still having shortness of breath, cough. Started second round of azithromycin yesterday. Cough with green phlegm. Chest tightness and wheezing. No reported falls in the last 12 months.     PT Comments    Pt demonstrates improving cardiopulmonary endurance with ambulation on this date. Please see gait section for O2 saturation details. She is able to increase her ambulation distance with less fatigue and less dyspnea. Pt able to complete all seated exercises as instructed. She would benefit from pulmonary rehab to continue to improve her baseline pulmonary endurance and to prevent readmission. Pt will benefit from skilled PT services to address deficits in strength, balance, and mobility in order to return to full function at home.    Follow Up Recommendations  No PT follow up;Other (comment) (Pulmonary rehab per MD referral)     Equipment Recommendations  None recommended by PT    Recommendations for Other Services       Precautions / Restrictions Precautions Precautions: Fall Restrictions Weight Bearing Restrictions: No    Mobility  Bed Mobility               General bed mobility comments: Received standing upright with RN. Left sitting upright at EOB  Transfers Overall transfer level: Needs assistance Equipment used: None Transfers: Sit to/from Stand Sit to Stand: Min guard         General transfer comment: Pt with good strength and speed with sit to stand transfers. Good stability noted without assistive device. 5TSTS: 15.3 seconds  Ambulation/Gait Ambulation/Gait  assistance: Min guard Ambulation Distance (Feet): 400 Feet Assistive device: None Gait Pattern/deviations: WFL(Within Functional Limits) Gait velocity: Decreased   General Gait Details: Pt with good step length and gait speed on this date. She is able to perform horizontal and vertical head turns without lateral gait deviations.Pt reports 6/10 RPE during ambulation on 2L/min O2. SaO2 88% on room air at rest. SaO2 remains mostly over 90% during ambulation. It does drop once to 86% but standing rest break with pursed lip breathing recovers to >90% on 2L/min. Pt able to significantly increase her ambulation on this date with decreased O2 desaturation.   Stairs            Wheelchair Mobility    Modified Rankin (Stroke Patients Only)       Balance Overall balance assessment: Needs assistance Sitting-balance support: No upper extremity supported Sitting balance-Leahy Scale: Normal     Standing balance support: No upper extremity supported Standing balance-Leahy Scale: Good                      Cognition Arousal/Alertness: Awake/alert Behavior During Therapy: WFL for tasks assessed/performed Overall Cognitive Status: Within Functional Limits for tasks assessed                      Exercises General Exercises - Lower Extremity Long Arc Quad: Strengthening;Both;Seated;15 reps Heel Slides: Strengthening;Both;15 reps;Seated Hip ABduction/ADduction: Strengthening;Both;15 reps;Seated Hip Flexion/Marching: Strengthening;Both;15 reps;Seated Heel Raises: Strengthening;Both;15 reps;Seated    General Comments        Pertinent Vitals/Pain Pain Assessment: No/denies pain    Home Living  Prior Function            PT Goals (current goals can now be found in the care plan section) Acute Rehab PT Goals Patient Stated Goal: "I want to get back home." PT Goal Formulation: With patient/family Time For Goal Achievement:  10/23/15 Potential to Achieve Goals: Good Progress towards PT goals: Progressing toward goals    Frequency  Min 2X/week    PT Plan Current plan remains appropriate    Co-evaluation             End of Session Equipment Utilized During Treatment: Gait belt Activity Tolerance: Patient tolerated treatment well Patient left: in bed;with call bell/phone within reach (Pt requests bed alarm off)     Time: [id number] PT Time Calculation (min) (ACUTE ONLY): 26 min  Charges:  $Gait Training: 8-22 mins $Therapeutic Exercise: 8-22 mins                    G Codes:      Lyndel Safe Huprich PT, DPT   Huprich,Jason 10/11/2015, 11:51 AM

## 2015-10-11 NOTE — Plan of Care (Signed)
Problem: Activity: Goal: Ability to implement measures to reduce episodes of fatigue will improve Outcome: Progressing Worked with PT today. Educated that she needs to slow down and focus her breathing when she ambulates to help with oxygen needs

## 2015-10-11 NOTE — Progress Notes (Signed)
[facility] [place] NAME: Catherine Landry    MR#:  [id number]  DATE OF BIRTH:  25-Nov-1935  SUBJECTIVE:   Patient is doing fairly well this morning. She ambulated and is only requiring 1-2 L of oxygen. She is not quite ready for discharge today.  REVIEW OF SYSTEMS:    Review of Systems  Constitutional: Negative for fever, chills and malaise/fatigue.  HENT: Negative for sore throat.   Eyes: Negative for blurred vision.  Respiratory: Positive for cough. Negative for hemoptysis, shortness of breath and wheezing.   Cardiovascular: Negative for chest pain, palpitations and leg swelling.  Gastrointestinal: Negative for nausea, vomiting, abdominal pain, diarrhea and blood in stool.  Genitourinary: Negative for dysuria.  Musculoskeletal: Negative for back pain.  Neurological: Negative for dizziness, tremors and headaches.  Endo/Heme/Allergies: Does not bruise/bleed easily.    Tolerating Diet: Yes      DRUG ALLERGIES:  No Known Allergies  VITALS:  Blood pressure 185/89, pulse 86, temperature 97.5 F (36.4 C), temperature source Oral, resp. rate 20, height 5\' 3"  (1.6 m), weight 72.258 kg (159 lb 4.8 oz), SpO2 97 %.  PHYSICAL EXAMINATION:   Physical Exam  Constitutional: She is oriented to person, place, and time and well-developed, well-nourished, and in no distress. No distress.  HENT:  Head: Normocephalic.  Eyes: No scleral icterus.  Neck: Normal range of motion. Neck supple. No JVD present. No tracheal deviation present.  Cardiovascular: Normal rate, regular rhythm and normal heart sounds.  Exam reveals no gallop and no friction rub.   No murmur heard. Pulmonary/Chest: Effort normal and breath sounds normal. No respiratory distress. She has no wheezes. She has no rales. She exhibits no tenderness.  Abdominal: Soft. Bowel sounds are normal. She exhibits no distension and no mass. There is no tenderness. There is no rebound and  no guarding.  Musculoskeletal: Normal range of motion. She exhibits no edema.  Neurological: She is alert and oriented to person, place, and time.  Skin: Skin is warm. No rash noted. No erythema.  Psychiatric: Affect and judgment normal.      LABORATORY PANEL:   CBC  Recent Labs Lab 10/11/15 0645  WBC 7.5  HGB 12.4  HCT 37.7  PLT 265   ------------------------------------------------------------------------------------------------------------------  Chemistries   Recent Labs Lab 10/11/15 0645  NA 134*  K 4.5  CL 97*  CO2 30  GLUCOSE 135*  BUN 29*  CREATININE 1.13*  CALCIUM 8.5*   ------------------------------------------------------------------------------------------------------------------  Cardiac Enzymes  Recent Labs Lab 10/06/15 0855  TROPONINI <0.03   ------------------------------------------------------------------------------------------------------------------  RADIOLOGY:  Ct Chest Wo Contrast  10/10/2015  CLINICAL DATA:  Cough, shortness of breath. EXAM: CT CHEST WITHOUT CONTRAST TECHNIQUE: Multidetector CT imaging of the chest was performed following the standard protocol without IV contrast. COMPARISON:  Chest radiograph of October 09, 2015; CT scan of October 06, 2015. FINDINGS: Multilevel degenerative disc disease is noted in the thoracic spine. No pneumothorax or pleural effusion is noted. Stable biapical scarring is noted. Ground-glass opacity seen in right upper lobe on prior exam has nearly resolved, most consistent with focal inflammation. Stable 5 mm irregular nodular density is noted laterally in right upper lobe, best seen on image number 25 of series 3. No other significant pulmonary parenchymal abnormality is noted. Atherosclerosis of thoracic aorta is noted without aneurysm formation. Coronary artery calcifications are noted. No definite mediastinal mass or adenopathy is seen on these unenhanced images. Stable right adrenal adenoma is  noted. IMPRESSION:  Ground-glass opacity seen in right upper lobe on prior exam is nearly resolved, most consistent with improving inflammation. Coronary artery calcifications are noted consistent with coronary artery disease. Stable 5 mm irregular nodular density is noted laterally in right upper lobe. If the patient is at high risk for bronchogenic carcinoma, follow-up chest CT at 6-12 months is recommended. If the patient is at low risk for bronchogenic carcinoma, follow-up chest CT at 12 months is recommended. This recommendation follows the consensus statement: Guidelines for Management of Small Pulmonary Nodules Detected on CT Scans: A Statement from the [place] as published in Radiology 2005;237:395-400. Electronically Signed   By: Marijo Conception, M.D.   On: 10/10/2015 13:48     ASSESSMENT AND PLAN:   80 year old female with history of COPD not on oxygen who presented with short and Cipro thank you hypoxic respiratory failure.  1.Acute hypoxic respiratory failure secondary to COPD exacerbation. CT of the chest on admission did not show evidence of pulmonary emboli. Repeat chest CT1/19 shows ground glass opacity seen in right upper lobe on prior exam is nearly resolved. Patient's symptoms have much improved. Patient is on 2 L of oxygen on ambulation. I will transition IV steroids to oral steroids and monitor the patient 1 more night.  Acute COPD exacerbation: Patient was on Rocephin and azithromycin. I will discontinue both of these antibiotics tomorrow and taper steroids.  3. Right upper lobe opacity with 5 mm irregular nodular density: Patient needs a repeat CT chest in 6 months.  4. Essential hypertension: Patient's blood pressure was elevated today. She is on losartan and Toprol. I have added when necessary hydralazine and will monitor blood pressure today.   Management plans discussed with the patient and she is in agreement.  CODE STATUS: FULL TOTAL TIME TAKING CARE OF  THIS PATIENT: 30 minutes.     POSSIBLE D/C tomorrow, DEPENDING ON CLINICAL CONDITION.   Tonette Koehne M.D on 10/11/2015 at 12:15 PM  Between 7am to 6pm - Pager - [phone] After 6pm go to [email or web address] - password [email or web address] [facility]  Office  [phone]  CC: Primary care physician; Tracie Harrier, MD  Note: This dictation was prepared with Dragon dictation along with smaller phrase technology. Any transcriptional errors that result from this process are unintentional.

## 2015-10-11 NOTE — Plan of Care (Signed)
Problem: Health Behavior/Discharge Planning: Goal: Ability to manage health-related needs will improve Outcome: Progressing Pt verbalizes need to go to COPD rehab outpatient.  Problem: Physical Regulation: Goal: Ability to maintain clinical measurements within normal limits will improve Outcome: Progressing Pt ambulating in room more, using IS every hour and O2 is at 2 L [place].  Problem: Skin Integrity: Goal: Risk for impaired skin integrity will decrease Outcome: Progressing Pt more mobile and ambulating more frequently  Problem: Activity: Goal: Risk for activity intolerance will decrease Outcome: Progressing Pt improving distance and frequency of ambulation in room  Problem: Education: Goal: Knowledge of disease or condition will improve Outcome: Progressing Pt able to discuss COPD warning signs and what she can do differently next time before she requires hospitalization.  Problem: Coping: Goal: Level of anxiety will decrease Outcome: Progressing Pt verbalizes decreased anxiety

## 2015-10-12 LAB — GLUCOSE, CAPILLARY: GLUCOSE-CAPILLARY: 84 mg/dL (ref 65–99)

## 2015-10-12 MED ORDER — PREDNISONE 10 MG (21) PO TBPK: 10.0000 mg | ORAL_TABLET | Freq: Every day | ORAL | Status: DC

## 2015-10-12 MED ORDER — THEOPHYLLINE ER 300 MG PO CP24: 300.0000 mg | ORAL_CAPSULE | Freq: Every morning | ORAL | Status: DC

## 2015-10-12 NOTE — Progress Notes (Addendum)
Discharge instructions given and went over with patient at bedside. All questions answered. Patient discharged home with 2L-O2 via wheelchair by nursing staff. Madlyn Frankel, RN  Oxygen tank that was provided by [facility] was found empty. CM notified. Patient discharge on hold until new tank is delivered. 9:51 AM

## 2015-10-12 NOTE — Care Management Note (Signed)
Case Management Note  Patient Details  Name: Catherine Landry MRN: [id number] Date of Birth: 1935-11-11  Subjective/Objective:     Has a portable oxygen tank in her room for transport home today. Husband verbalized understanding to call [facility] as soon as they get home today to have the home oxygen set up. Husband chose [facility] to provide home health PT and RN services. A referral was faxed to [facility] requesting home health RN and PT.                Action/Plan:   Expected Discharge Date:                  Expected Discharge Plan:  Home/Self Care  In-House Referral:     Discharge planning Services  CM Consult  Post Acute Care Choice:    Choice offered to:     DME Arranged:    DME Agency:     HH Arranged:    HH Agency:     Status of Service:  In process, will continue to follow  Medicare Important Message Given:  Yes Date Medicare IM Given:    Medicare IM give by:    Date Additional Medicare IM Given:    Additional Medicare Important Message give by:     If discussed at [place] of Stay Meetings, dates discussed:    Additional Comments:  Flint Hakeem A, RN 10/12/2015, 9:20 AM

## 2015-10-12 NOTE — Progress Notes (Signed)
Oxygen tank delivered - patient discharged home with husband. Madlyn Frankel, RN

## 2015-10-12 NOTE — Progress Notes (Signed)
Spoke with Chasity at [facility] about delivery of O2 tank - was informed that it should be delivered within the next couple of hours. Madlyn Frankel, RN

## 2015-10-12 NOTE — Discharge Summary (Addendum)
[facility] [place] NAME: Catherine Landry    MR#:  [id number]  DATE OF BIRTH:  1935-11-17  DATE OF ADMISSION:  10/06/2015 ADMITTING PHYSICIAN: [name], MD  DATE OF DISCHARGE: 10/12/2015 PRIMARY CARE PHYSICIAN: Tracie Harrier, MD    ADMISSION DIAGNOSIS:  Hypoxia [R09.02] COPD exacerbation ([place]) [J44.1]  DISCHARGE DIAGNOSIS:  Active Problems:   COPD exacerbation (H[facility])  pneumonia  SECONDARY DIAGNOSIS:   Past Medical History  Diagnosis Date  . Hypertension   . Renal insufficiency   . Asthma   . Cancer [facility])     HOSPITAL COURSE:    81 year old female with history of COPD not on oxygen who presented with short and Cipro thank you hypoxic respiratory failure.  1.Acute hypoxic respiratory failure secondary to COPD exacerbation. CT of the chest on admission did not show evidence of pulmonary emboli. Repeat chest CT1/19 shows ground glass opacity seen in right upper lobe on prior exam is nearly resolved. Patient's symptoms have much improved. She has completed her course of antibiotics and will be discharged on steroid taper. She will need oxygen at discharge. She was seen and evaluated by pulmonary. She will have pulmonary follow-up within the next 2-3 weeks.  2. Acute COPD exacerbation: Patient was on Rocephin and azithromycin. She will continue with steroid taper.   3. Right upper lobe opacity with 5 mm irregular nodular density: Patient needs a repeat CT chest in 6 months. She will see the pulmonologist within the next 2-3 weeks. 4. Essential hypertension: Continue with losartan and Toprol. 5. Pneumonia: Patient was treated for pneumonia during this hospitalization.  DISCHARGE CONDITIONS AND DIET:   Patient is stable for discharge on heart healthy diet  CONSULTS OBTAINED:  Treatment Team:  Erby Pian, MD  DRUG ALLERGIES:  No Known Allergies  DISCHARGE MEDICATIONS:   Current Discharge Medication  List    START taking these medications   Details  predniSONE (STERAPRED UNI-PAK 21 TAB) 10 MG (21) TBPK tablet Take 1 tablet (10 mg total) by mouth daily. Qty: 42 tablet, Refills: 0      CONTINUE these medications which have CHANGED   Details  theophylline (THEO-24) 300 MG 24 hr capsule Take 1 capsule (300 mg total) by mouth every morning. Qty: 30 capsule, Refills: 0      CONTINUE these medications which have NOT CHANGED   Details  ADVAIR DISKUS 250-50 MCG/DOSE AEPB Inhale 1 puff into the lungs 2 (two) times daily. *Rinse mouth after use* Refills: 1    albuterol (PROVENTIL) (2.5 MG/3ML) 0.083% nebulizer solution Take 3-6 mLs by nebulization every 6 (six) hours as needed. For shortness of breath/wheezing. Refills: 5    allopurinol (ZYLOPRIM) 100 MG tablet Take 100 mg by mouth daily. Refills: 3    aspirin EC 81 MG tablet Take 81 mg by mouth every morning.    Calcium-Magnesium-Vitamin D (CALCIUM 1200+D3 PO) Take 1 tablet by mouth every morning.    COMBIVENT RESPIMAT 20-100 MCG/ACT AERS respimat Inhale 1 puff into the lungs 4 (four) times daily as needed. For COPD. Refills: 5    losartan (COZAAR) 50 MG tablet Take 50 mg by mouth every morning.    metoprolol succinate (TOPROL-XL) 25 MG 24 hr tablet Take 25 mg by mouth every morning.    Multiple Vitamin (MULTIVITAMIN WITH MINERALS) TABS Take 1 tablet by mouth every morning.    omeprazole (PRILOSEC) 40 MG capsule Take 40 mg by mouth every morning. Refills: 5  STOP taking these medications     azithromycin (ZITHROMAX) 250 MG tablet      Fluticasone-Salmeterol (ADVAIR) 100-50 MCG/DOSE AEPB      theophylline (THEODUR) 300 MG 12 hr tablet      oxybutynin (DITROPAN) 5 MG tablet      oxyCODONE-acetaminophen (PERCOCET/ROXICET) 5-325 MG per tablet               Today   CHIEF COMPLAINT:  Patient is doing well this morning.   VITAL SIGNS:  Blood pressure 131/75, pulse 73, temperature 97.8 F (36.6 C),  temperature source Oral, resp. rate 18, height 5\' 3"  (1.6 m), weight 72.258 kg (159 lb 4.8 oz), SpO2 96 %.   REVIEW OF SYSTEMS:  Review of Systems  Constitutional: Negative for fever, chills and malaise/fatigue.  HENT: Negative for sore throat.   Eyes: Negative for blurred vision.  Respiratory: Negative for cough, hemoptysis, shortness of breath and wheezing.   Cardiovascular: Negative for chest pain, palpitations and leg swelling.  Gastrointestinal: Negative for nausea, vomiting, abdominal pain, diarrhea and blood in stool.  Genitourinary: Negative for dysuria.  Musculoskeletal: Negative for back pain.  Neurological: Negative for dizziness, tremors and headaches.  Endo/Heme/Allergies: Does not bruise/bleed easily.  Psychiatric/Behavioral: Positive for memory loss.     PHYSICAL EXAMINATION:  GENERAL:  80 y.o.-year-old patient lying in the bed with no acute distress.  NECK:  Supple, no jugular venous distention. No thyroid enlargement, no tenderness.  LUNGS: Normal breath sounds bilaterally, no wheezing, rales,rhonchi  No use of accessory muscles of respiration.  CARDIOVASCULAR: S1, S2 normal. No murmurs, rubs, or gallops.  ABDOMEN: Soft, non-tender, non-distended. Bowel sounds present. No organomegaly or mass.  EXTREMITIES: No pedal edema, cyanosis, or clubbing.  PSYCHIATRIC: The patient is alert and oriented x 3.  SKIN: No obvious rash, lesion, or ulcer.   DATA REVIEW:   CBC  Recent Labs Lab 10/11/15 0645  WBC 7.5  HGB 12.4  HCT 37.7  PLT 265    Chemistries   Recent Labs Lab 10/11/15 0645  NA 134*  K 4.5  CL 97*  CO2 30  GLUCOSE 135*  BUN 29*  CREATININE 1.13*  CALCIUM 8.5*    Cardiac Enzymes  Recent Labs Lab 10/06/15 0855  TROPONINI <0.03    Microbiology Results  @MICRORSLT48 @  RADIOLOGY:  Ct Chest Wo Contrast  10/10/2015  CLINICAL DATA:  Cough, shortness of breath. EXAM: CT CHEST WITHOUT CONTRAST TECHNIQUE: Multidetector CT imaging of the  chest was performed following the standard protocol without IV contrast. COMPARISON:  Chest radiograph of October 09, 2015; CT scan of October 06, 2015. FINDINGS: Multilevel degenerative disc disease is noted in the thoracic spine. No pneumothorax or pleural effusion is noted. Stable biapical scarring is noted. Ground-glass opacity seen in right upper lobe on prior exam has nearly resolved, most consistent with focal inflammation. Stable 5 mm irregular nodular density is noted laterally in right upper lobe, best seen on image number 25 of series 3. No other significant pulmonary parenchymal abnormality is noted. Atherosclerosis of thoracic aorta is noted without aneurysm formation. Coronary artery calcifications are noted. No definite mediastinal mass or adenopathy is seen on these unenhanced images. Stable right adrenal adenoma is noted. IMPRESSION: Ground-glass opacity seen in right upper lobe on prior exam is nearly resolved, most consistent with improving inflammation. Coronary artery calcifications are noted consistent with coronary artery disease. Stable 5 mm irregular nodular density is noted laterally in right upper lobe. If the patient is at high risk for  bronchogenic carcinoma, follow-up chest CT at 6-12 months is recommended. If the patient is at low risk for bronchogenic carcinoma, follow-up chest CT at 12 months is recommended. This recommendation follows the consensus statement: Guidelines for Management of Small Pulmonary Nodules Detected on CT Scans: A Statement from the [place] as published in Radiology 2005;237:395-400. Electronically Signed   By: Marijo Conception, M.D.   On: 10/10/2015 13:48      Management plans discussed with the patient and she is in agreement. Stable for discharge home with home health care  Patient should follow up with PCP in one week and pulmonologist in 2-3 weeks  CODE STATUS:     Code Status Orders        Start     Ordered   10/06/15 1403  Full  code   Continuous     10/06/15 1407    Code Status History    Date Active Date Inactive Code Status Order ID Comments User Context   This patient has a current code status but no historical code status.    Advance Directive Documentation        Most Recent Value   Type of Advance Directive  Healthcare Power of Attorney, Living will   Pre-existing out of facility DNR order (yellow form or pink MOST form)     "MOST" Form in Place?        TOTAL TIME TAKING CARE OF THIS PATIENT: 35 minutes.    Note: This dictation was prepared with Dragon dictation along with smaller phrase technology. Any transcriptional errors that result from this process are unintentional.  Meliton Samad M.D on 10/12/2015 at 9:31 AM  Between 7am to 6pm - Pager - [phone] After 6pm go to [email or web address] - password [email or web address] [facility]  Office  [phone]  CC: Primary care physician; Tracie Harrier, MD

## 2015-10-12 NOTE — Care Management Note (Signed)
Case Management Note  Patient Details  Name: Jaculin Mazmanian MRN: [id number] Date of Birth: May 16, 1936  Subjective/Objective:    Apparently the portable oxygen tank in [facility] [employer] hospital room was left on and is empty. This writter called [facility] at [facility] and requested another portable oxygen tank be delivered as soon as possible so that [facility] can be discharged home with continuous 2L N/C.                 Action/Plan:   Expected Discharge Date:                  Expected Discharge Plan:  Home/Self Care  In-House Referral:     Discharge planning Services  CM Consult  Post Acute Care Choice:    Choice offered to:     DME Arranged:    DME Agency:     HH Arranged:    HH Agency:     Status of Service:  In process, will continue to follow  Medicare Important Message Given:  Yes Date Medicare IM Given:    Medicare IM give by:    Date Additional Medicare IM Given:    Additional Medicare Important Message give by:     If discussed at [place] of Stay Meetings, dates discussed:    Additional Comments:  Ellyana Crigler A, RN 10/12/2015, 9:58 AM

## 2015-10-12 NOTE — Progress Notes (Signed)
Prescriptions are ready to be picked at the patients pharmacy. Madlyn Frankel, RN

## 2015-10-12 NOTE — Discharge Instructions (Signed)
Chronic Obstructive Pulmonary Disease Chronic obstructive pulmonary disease (COPD) is a common lung condition in which airflow from the lungs is limited. COPD is a general term that can be used to describe many different lung problems that limit airflow, including both chronic bronchitis and emphysema. If you have COPD, your lung function will probably never return to normal, but there are measures you can take to improve lung function and make yourself feel better. CAUSES   Smoking (common).  Exposure to secondhand smoke.  Genetic problems.  Chronic inflammatory lung diseases or recurrent infections. SYMPTOMS  Shortness of breath, especially with physical activity.  Deep, persistent (chronic) cough with a large amount of thick mucus.  Wheezing.  Rapid breaths (tachypnea).  Gray or bluish discoloration (cyanosis) of the skin, especially in your fingers, toes, or lips.  Fatigue.  Weight loss.  Frequent infections or episodes when breathing symptoms become much worse (exacerbations).  Chest tightness. DIAGNOSIS Your health care provider will take a medical history and perform a physical examination to diagnose COPD. Additional tests for COPD may include:  Lung (pulmonary) function tests.  Chest X-ray.  CT scan.  Blood tests. TREATMENT  Treatment for COPD may include:  Inhaler and nebulizer medicines. These help manage the symptoms of COPD and make your breathing more comfortable.  Supplemental oxygen. Supplemental oxygen is only helpful if you have a low oxygen level in your blood.  Exercise and physical activity. These are beneficial for nearly all people with COPD.  Lung surgery or transplant.  Nutrition therapy to gain weight, if you are underweight.  Pulmonary rehabilitation. This may involve working with a team of health care providers and specialists, such as respiratory, occupational, and physical therapists. HOME CARE INSTRUCTIONS  Take all medicines  (inhaled or pills) as directed by your health care provider.  Avoid over-the-counter medicines or cough syrups that dry up your airway (such as antihistamines) and slow down the elimination of secretions unless instructed otherwise by your health care provider.  If you are a smoker, the most important thing that you can do is stop smoking. Continuing to smoke will cause further lung damage and breathing trouble. Ask your health care provider for help with quitting smoking. He or she can direct you to community resources or hospitals that provide support.  Avoid exposure to irritants such as smoke, chemicals, and fumes that aggravate your breathing.  Use oxygen therapy and pulmonary rehabilitation if directed by your health care provider. If you require home oxygen therapy, ask your health care provider whether you should purchase a pulse oximeter to measure your oxygen level at home.  Avoid contact with individuals who have a contagious illness.  Avoid extreme temperature and humidity changes.  Eat healthy foods. Eating smaller, more frequent meals and resting before meals may help you maintain your strength.  Stay active, but balance activity with periods of rest. Exercise and physical activity will help you maintain your ability to do things you want to do.  Preventing infection and hospitalization is very important when you have COPD. Make sure to receive all the vaccines your health care provider recommends, especially the pneumococcal and influenza vaccines. Ask your health care provider whether you need a pneumonia vaccine.  Learn and use relaxation techniques to manage stress.  Learn and use controlled breathing techniques as directed by your health care provider. Controlled breathing techniques include:  Pursed lip breathing. Start by breathing in (inhaling) through your nose for 1 second. Then, purse your lips as if you were   going to whistle and breathe out (exhale) through the  pursed lips for 2 seconds.  Diaphragmatic breathing. Start by putting one hand on your abdomen just above your waist. Inhale slowly through your nose. The hand on your abdomen should move out. Then purse your lips and exhale slowly. You should be able to feel the hand on your abdomen moving in as you exhale.  Learn and use controlled coughing to clear mucus from your lungs. Controlled coughing is a series of short, progressive coughs. The steps of controlled coughing are: 1. Lean your head slightly forward. 2. Breathe in deeply using diaphragmatic breathing. 3. Try to hold your breath for 3 seconds. 4. Keep your mouth slightly open while coughing twice. 5. Spit any mucus out into a tissue. 6. Rest and repeat the steps once or twice as needed. SEEK MEDICAL CARE IF:  You are coughing up more mucus than usual.  There is a change in the color or thickness of your mucus.  Your breathing is more labored than usual.  Your breathing is faster than usual. SEEK IMMEDIATE MEDICAL CARE IF:  You have shortness of breath while you are resting.  You have shortness of breath that prevents you from:  Being able to talk.  Performing your usual physical activities.  You have chest pain lasting longer than 5 minutes.  Your skin color is more cyanotic than usual.  You measure low oxygen saturations for longer than 5 minutes with a pulse oximeter. MAKE SURE YOU:  Understand these instructions.  Will watch your condition.  Will get help right away if you are not doing well or get worse.   This information is not intended to replace advice given to you by your health care provider. Make sure you discuss any questions you have with your health care provider.   Document Released: 06/17/2005 Document Revised: 09/28/2014 Document Reviewed: 05/04/2013 Elsevier Interactive Patient Education 2016 [employer]  

## 2015-11-12 ENCOUNTER — Other Ambulatory Visit: Payer: Self-pay | Admitting: Internal Medicine

## 2015-11-12 DIAGNOSIS — Z1231 Encounter for screening mammogram for malignant neoplasm of breast: Secondary | ICD-10-CM

## 2015-11-13 ENCOUNTER — Ambulatory Visit (INDEPENDENT_AMBULATORY_CARE_PROVIDER_SITE_OTHER): Payer: Medicare Other | Admitting: Podiatry

## 2015-11-13 ENCOUNTER — Ambulatory Visit (INDEPENDENT_AMBULATORY_CARE_PROVIDER_SITE_OTHER): Payer: Medicare Other

## 2015-11-13 ENCOUNTER — Encounter: Payer: Self-pay | Admitting: Podiatry

## 2015-11-13 VITALS — BP 119/70 | HR 87 | Resp 18

## 2015-11-13 DIAGNOSIS — B351 Tinea unguium: Secondary | ICD-10-CM | POA: Diagnosis not present

## 2015-11-13 DIAGNOSIS — M778 Other enthesopathies, not elsewhere classified: Secondary | ICD-10-CM

## 2015-11-13 DIAGNOSIS — M7750 Other enthesopathy of unspecified foot: Secondary | ICD-10-CM | POA: Diagnosis not present

## 2015-11-13 DIAGNOSIS — G629 Polyneuropathy, unspecified: Secondary | ICD-10-CM

## 2015-11-13 DIAGNOSIS — M79673 Pain in unspecified foot: Secondary | ICD-10-CM

## 2015-11-13 DIAGNOSIS — R52 Pain, unspecified: Secondary | ICD-10-CM | POA: Diagnosis not present

## 2015-11-13 DIAGNOSIS — M779 Enthesopathy, unspecified: Secondary | ICD-10-CM

## 2015-11-13 DIAGNOSIS — M79676 Pain in unspecified toe(s): Secondary | ICD-10-CM

## 2015-11-13 MED ORDER — PREGABALIN 50 MG PO CAPS: 50.0000 mg | ORAL_CAPSULE | Freq: Three times a day (TID) | ORAL | Status: DC

## 2015-11-13 NOTE — Progress Notes (Signed)
   Subjective:    Patient ID: Catherine Landry, female    DOB: [birth year], 80 y.o.   MRN: [id number]  HPI I AM HAVING SOME LEFT FOOT PAIN AND THERE IS SOME NUMBNESS GOING UP TO MY LEFT KNEE AND THE SIDE OF MY LEFT    Review of Systems  All other systems reviewed and are negative.      Objective:   Physical Exam: Vital signs are stable she is alert and oriented 3. Pulses are palpable. Neurologic sensorium is intact. There she does have some slight decrease on sharp dull sensation. But she can still feel a 10 g monofilament. She has tenderness on range of motion of the second metatarsophalangeal joint and on direct palpation of the second metatarsophalangeal joint. Radiographs confirm rectus foot type with movement and no major osseous abnormalities. Cutaneous evaluation demonstrates supple well-hydrated cutis no erythema edema cellulitis drainage or odor. Her toenails are thick yellow dystrophic and clinically mycotic.          Assessment & Plan:  Assessment: Metatarsalgia likely associated with fat pad atrophy and capsulitis of the second metatarsophalangeal joint. 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 Pain in limb secondary to onychomycosis.  Plan: Placed her in silicone metatarsal pads bilateral. Debrided toenails 1 through 5 bilateral as a covered service. Started her on Lyrica 50 mg 1 by mouth twice a day and will follow up with her in 1 month we discussed appropriate shoe gear stretching exercises ice therapy and shoe modifications.

## 2015-11-27 ENCOUNTER — Ambulatory Visit (INDEPENDENT_AMBULATORY_CARE_PROVIDER_SITE_OTHER): Payer: Medicare Other | Admitting: Internal Medicine

## 2015-11-27 ENCOUNTER — Encounter: Payer: Self-pay | Admitting: Internal Medicine

## 2015-11-27 VITALS — BP 130/76 | HR 86 | Ht 63.0 in | Wt 155.4 lb

## 2015-11-27 DIAGNOSIS — R911 Solitary pulmonary nodule: Secondary | ICD-10-CM | POA: Diagnosis not present

## 2015-11-27 DIAGNOSIS — J438 Other emphysema: Secondary | ICD-10-CM

## 2015-11-27 NOTE — Patient Instructions (Addendum)
CT of the chest without contrast Re: lung nodule. You can have this test done at [facility].   Continue your current COPD medications.   Exercise is good for your lungs, the more active your are, the better.

## 2015-11-27 NOTE — Progress Notes (Signed)
[facility] Consultation      Assessment and Plan:  The patient is a 80 year old female with a history of invasive bladder cancer, emphysema, lung nodule, being followed up post hospitalization for acute COPD exacerbation  Lung Nodule. -CT chest in April 2016 Showed 0.7 cmm lung nodule in superior segment of Rt lower lobe  - Repeat CT chest, pt going to [facility] for a repeat CT scan for bladder cancer follow up, will add a CT chest without contrast to testing being done at [facility].  Emphysema.  -Chronic severe emphysema, will repeat PFT before next visit.  -Continue advair, theophylline, combivent, and prn albuterol nebs.   Chronic respiratory failure.  -Nocturnal hypoxia, continue oxygen at 2L qhs.   Bladder cancer. -We will need to follow her lung nodule, given risk for metastatic disease.  Date: 11/27/2015  MRN# [id number] Catherine Landry [birth year]  Referring Physician: Dr. Ginette Pitman.   Catherine Landry is a 80 y.o. old female seen in consultation for chief complaint of:    Chief Complaint  Patient presents with  . Hospitalization Follow-up    pt. sates breathing has improved since hosp. occ. SOB. prod. cough white to yellow in color. denies wheezing or chest pain/tightness.    HPI:  The patient is a 80 year old female with a history of COPD. She is currently on Advair 250/50, theophylline, Combivent Respimat. She was seen in the hospital in January of this year for AECOPD. She was discharged on oxygen at 2L at night.  She feels that her breathing is better, she feels that her breathing is back to normal. She is using advair bid, and rinses mouth. She is using combivent most days as needed once to 3 times per day.  She takes theophylline once daily and feels that it is helping.  She uses albuterol nebs once or twice per week. She has not smoked in 16 years. She drives a car, she does all her own house work she has no difficulty ambulating a grocery store.    She has a history of bladder cancer, chronic kidney disease, GERD, invasive bladder cancer, and left  nephroureterectomy in 2002. For her last note with oncology in October 2016, her restaging CT was negative for recurrent or metastatic disease.  CT chest in April 2016 Showed 0.7 cmm lung nodule in superior segment of Rt lower lobe  CT chest images and report from 10/10/2015 and compared with previous from from 10/06/2015. There appeared to be a small right upper lobe nodule, as well as a small area of scarring/pneumonia which appeared to have resolved and the subsequent CT, although the nodule is still present. We'll CT shows significant bilateral emphysema. There is also streaky atelectasis seen in the left base.  PMHX:    Past Medical History  Diagnosis Date  . Hypertension   . Renal insufficiency   . Asthma   . Cancer [facility])    Surgical Hx:  Past Surgical History  Procedure Laterality Date  . Back surgery     Family Hx:  No family history on file. Social Hx:   Social History  Substance Use Topics  . Smoking status: Former Smoker    Quit date: 10/10/2014  . Smokeless tobacco: Never Used  . Alcohol Use: No   Medication:   Current Outpatient Rx  Name  Route  Sig  Dispense  Refill  . ADVAIR DISKUS 250-50 MCG/DOSE AEPB   Inhalation   Inhale 1 puff into the lungs 2 (two) times daily. *  Rinse mouth after use*      1     Dispense as written.   Marland Kitchen albuterol (PROVENTIL) (2.5 MG/3ML) 0.083% nebulizer solution   Nebulization   Take 3-6 mLs by nebulization every 6 (six) hours as needed. For shortness of breath/wheezing.      5   . allopurinol (ZYLOPRIM) 100 MG tablet   Oral   Take 100 mg by mouth daily.      3   . aspirin EC 81 MG tablet   Oral   Take 81 mg by mouth every morning.         Marland Kitchen azithromycin (ZITHROMAX) 250 MG tablet            0   . Calcium-Magnesium-Vitamin D (CALCIUM 1200+D3 PO)   Oral   Take 1 tablet by mouth every morning.         .  cefUROXime (CEFTIN) 250 MG tablet            0   . colchicine 0.6 MG tablet               . COMBIVENT RESPIMAT 20-100 MCG/ACT AERS respimat   Inhalation   Inhale 1 puff into the lungs 4 (four) times daily as needed. For COPD.      5     Dispense as written.   Marland Kitchen losartan (COZAAR) 50 MG tablet   Oral   Take 50 mg by mouth every morning.         Marland Kitchen losartan (COZAAR) 50 MG tablet               . metoprolol succinate (TOPROL-XL) 25 MG 24 hr tablet   Oral   Take 25 mg by mouth every morning.         . montelukast (SINGULAIR) 10 MG tablet            5   . Multiple Vitamin (MULTIVITAMIN WITH MINERALS) TABS   Oral   Take 1 tablet by mouth every morning.         Marland Kitchen omeprazole (PRILOSEC) 40 MG capsule   Oral   Take 40 mg by mouth every morning.      5   . predniSONE (DELTASONE) 10 MG tablet            0   . predniSONE (STERAPRED UNI-PAK 21 TAB) 10 MG (21) TBPK tablet   Oral   Take 1 tablet (10 mg total) by mouth daily.   42 tablet   0   . pregabalin (LYRICA) 50 MG capsule   Oral   Take 1 capsule (50 mg total) by mouth 3 (three) times daily.   60 capsule   3   . theophylline (THEO-24) 300 MG 24 hr capsule   Oral   Take 1 capsule (300 mg total) by mouth every morning.   30 capsule   0   . theophylline (THEODUR) 300 MG 12 hr tablet      TK 1 T PO QAM      0       Allergies:  Review of patient's allergies indicates no known allergies.  Review of Systems: Gen:  Denies  fever, sweats, chills HEENT: Denies blurred vision, double vision.  Cvc:  No dizziness, chest pain. Resp:   Denies cough or sputum porduction, Gi: Denies swallowing difficulty, stomach pain. Gu:  Denies bladder incontinence, burning urine Ext:   No Joint pain, stiffness. Skin: No skin rash,  hives Endoc:  No  polyuria, polydipsia. Psych: No depression, insomnia. Other:  All other systems were reviewed with the patient and were negative other that what is mentioned in  the HPI.   Physical Examination:   VS: BP 130/76 mmHg  Pulse 86  Ht 5\' 3"  (1.6 m)  Wt 155 lb 6.4 oz (70.489 kg)  BMI 27.53 kg/m2  SpO2 90% on room air. General Appearance: No distress  Neuro:without focal findings,  speech normal,  HEENT: PERRLA, EOM intact.   Pulmonary: normal breath sounds,  Decreased air entry bilaterally. CardiovascularNormal S1,S2.  No m/r/g.   Abdomen: Benign, Soft, non-tender. Renal:  No costovertebral tenderness  GU:  No performed at this time. Endoc: No evident thyromegaly, no signs of acromegaly. Skin:   warm, no rashes, no ecchymosis  Extremities: normal, no cyanosis, clubbing.  Other findings:    LABORATORY PANEL:   CBC No results for input(s): WBC, HGB, HCT, PLT in the last 168 hours. ------------------------------------------------------------------------------------------------------------------  Chemistries  No results for input(s): NA, K, CL, CO2, GLUCOSE, BUN, CREATININE, CALCIUM, MG, AST, ALT, ALKPHOS, BILITOT in the last 168 hours.  Invalid input(s): GFRCGP ------------------------------------------------------------------------------------------------------------------  Cardiac Enzymes No results for input(s): TROPONINI in the last 168 hours. ------------------------------------------------------------  RADIOLOGY:  No results found.     Thank  you for the consultation and for allowing [facility] to assist in the care of your patient. Our recommendations are noted above.  Please contact us if we can be of further service.   Marda Stalker, MD.  Board Certified in Internal Medicine, Pulmonary Medicine, [place], and Sleep Medicine.  [facility]  Patricia Pesa, M.D.  Vilinda Boehringer, M.D.  Merton Border, M.D

## 2015-12-11 ENCOUNTER — Encounter: Payer: Self-pay | Admitting: Podiatry

## 2015-12-11 ENCOUNTER — Ambulatory Visit (INDEPENDENT_AMBULATORY_CARE_PROVIDER_SITE_OTHER): Payer: Medicare Other | Admitting: Podiatry

## 2015-12-11 VITALS — BP 133/63 | HR 87 | Resp 18

## 2015-12-11 DIAGNOSIS — G629 Polyneuropathy, unspecified: Secondary | ICD-10-CM

## 2015-12-11 DIAGNOSIS — L600 Ingrowing nail: Secondary | ICD-10-CM | POA: Diagnosis not present

## 2015-12-11 MED ORDER — NEOMYCIN-POLYMYXIN-HC 1 % OT SOLN: OTIC | Status: DC

## 2015-12-11 NOTE — Patient Instructions (Signed)

## 2015-12-11 NOTE — Progress Notes (Signed)
She states that her left foot is doing great now that she stopped her allopurinol. However she states that she has ingrown toenails that she would like to have removed today.  Objective: Vital signs are stable she is alert and oriented 3. Pulses are palpable and capillary fill time is normal. Sharp incurvated nail margins with mild erythema along the tibial and fibular borders of the hallux bilateral. No signs of bacterial infection.  Assessment: Ingrown nail paronychia hallux bilateral.  Plan: Perform chemical matrixectomy to the tibial and fibular border of the hallux bilateral after local anesthesia was administered. She tolerated this procedure well. She was provided with both oral and written home-going instructions for care and soaking of her toe. She is also provided a prescription for Cortisporin Otic to be applied twice daily after soaking. All follow up with her in 1 week.

## 2015-12-23 ENCOUNTER — Ambulatory Visit (INDEPENDENT_AMBULATORY_CARE_PROVIDER_SITE_OTHER): Payer: Medicare Other | Admitting: Podiatry

## 2015-12-23 ENCOUNTER — Encounter: Payer: Self-pay | Admitting: Podiatry

## 2015-12-23 VITALS — BP 106/72 | HR 69 | Resp 12

## 2015-12-23 DIAGNOSIS — L600 Ingrowing nail: Secondary | ICD-10-CM

## 2015-12-23 NOTE — Progress Notes (Signed)
She presents today for follow-up of her matrixectomy hallux bilateral. She states that they're doing much better. She continues to soak in Betadine until she developed a rash. She then started with Epsom salts alternating with white vinegar.  Objective: Vital signs are stable she is alert and oriented 3. Pulses are palpable. The margins of the hallux demonstrate well-healing surgical foot no signs of infection. Tibial and fibular border of the hallux doesn't Mr. some mild erythema but no purulence and no malodor.  Assessment: Well-healing hallux matrixectomy tibial and fibular borders bilateral.  Plan: Continue to soak in Epsom salts and warm water until completely resolved. Covered during the daytime with a Band-Aid and leave open at bedtime.

## 2015-12-25 ENCOUNTER — Ambulatory Visit
Admission: RE | Admit: 2015-12-25 | Discharge: 2015-12-25 | Disposition: A | Discharge status: Home / Self Care | Payer: Medicare Other | Source: Ambulatory Visit | Attending: Internal Medicine | Admitting: Internal Medicine

## 2015-12-25 ENCOUNTER — Other Ambulatory Visit: Payer: Self-pay | Admitting: Internal Medicine

## 2015-12-25 DIAGNOSIS — Z1231 Encounter for screening mammogram for malignant neoplasm of breast: Secondary | ICD-10-CM | POA: Diagnosis not present

## 2015-12-25 HISTORY — DX: Malignant neoplasm of bladder, unspecified: C67.9

## 2016-01-16 ENCOUNTER — Encounter: Payer: Self-pay | Admitting: Internal Medicine

## 2016-01-16 ENCOUNTER — Telehealth: Payer: Self-pay | Admitting: Internal Medicine

## 2016-01-16 NOTE — Telephone Encounter (Signed)
Patient dropped of cd from [facility] for ct scan .  Placed in [place] in box.

## 2016-01-16 NOTE — Telephone Encounter (Signed)
Will place in providers folder.

## 2016-04-08 ENCOUNTER — Telehealth: Payer: Self-pay | Admitting: Internal Medicine

## 2016-04-08 MED ORDER — PREDNISONE 20 MG PO TABS
40.0000 mg | ORAL_TABLET | Freq: Every day | ORAL | Status: DC
Start: 2016-04-08 — End: 2016-06-03

## 2016-04-08 NOTE — Telephone Encounter (Signed)
Pt informed Prednisone sent to pharmacy. Nothing further needed. 

## 2016-04-08 NOTE — Telephone Encounter (Signed)
Pt states she is congested, and doesn't have any energy. States she is coughing up green. Please call.

## 2016-04-08 NOTE — Telephone Encounter (Signed)
Prednisone 40 mg daily for 10 days 

## 2016-04-08 NOTE — Telephone Encounter (Signed)
Cough and congestion x over a week. Pt states she was give Cefuroxime 250mg  BID 7days. No fever. SOB worse. Pt states she had UTI with cough and congestion when she was given abx by PCP,  but breathing got worse and UTI cleared up. Please advise. DR pt.

## 2016-04-25 ENCOUNTER — Observation Stay
Admission: EM | Admit: 2016-04-25 | Discharge: 2016-04-29 | Disposition: A | Discharge status: Home / Self Care | Payer: Medicare Other | Attending: Internal Medicine | Admitting: Internal Medicine

## 2016-04-25 ENCOUNTER — Observation Stay: Payer: Medicare Other

## 2016-04-25 ENCOUNTER — Encounter: Payer: Self-pay | Admitting: Emergency Medicine

## 2016-04-25 ENCOUNTER — Emergency Department: Payer: Medicare Other

## 2016-04-25 DIAGNOSIS — N390 Urinary tract infection, site not specified: Secondary | ICD-10-CM | POA: Diagnosis not present

## 2016-04-25 DIAGNOSIS — R0602 Shortness of breath: Secondary | ICD-10-CM | POA: Diagnosis present

## 2016-04-25 DIAGNOSIS — N289 Disorder of kidney and ureter, unspecified: Secondary | ICD-10-CM | POA: Diagnosis not present

## 2016-04-25 DIAGNOSIS — Z79899 Other long term (current) drug therapy: Secondary | ICD-10-CM | POA: Diagnosis not present

## 2016-04-25 DIAGNOSIS — M419 Scoliosis, unspecified: Secondary | ICD-10-CM | POA: Diagnosis not present

## 2016-04-25 DIAGNOSIS — Z905 Acquired absence of kidney: Secondary | ICD-10-CM | POA: Diagnosis not present

## 2016-04-25 DIAGNOSIS — Z8551 Personal history of malignant neoplasm of bladder: Secondary | ICD-10-CM | POA: Diagnosis not present

## 2016-04-25 DIAGNOSIS — Z96641 Presence of right artificial hip joint: Secondary | ICD-10-CM | POA: Insufficient documentation

## 2016-04-25 DIAGNOSIS — J449 Chronic obstructive pulmonary disease, unspecified: Secondary | ICD-10-CM | POA: Diagnosis not present

## 2016-04-25 DIAGNOSIS — Z853 Personal history of malignant neoplasm of breast: Secondary | ICD-10-CM | POA: Insufficient documentation

## 2016-04-25 DIAGNOSIS — Z9981 Dependence on supplemental oxygen: Secondary | ICD-10-CM | POA: Diagnosis not present

## 2016-04-25 DIAGNOSIS — I10 Essential (primary) hypertension: Secondary | ICD-10-CM | POA: Diagnosis not present

## 2016-04-25 DIAGNOSIS — R109 Unspecified abdominal pain: Secondary | ICD-10-CM | POA: Diagnosis present

## 2016-04-25 DIAGNOSIS — E860 Dehydration: Secondary | ICD-10-CM | POA: Diagnosis not present

## 2016-04-25 DIAGNOSIS — Z7982 Long term (current) use of aspirin: Secondary | ICD-10-CM | POA: Insufficient documentation

## 2016-04-25 DIAGNOSIS — Z9221 Personal history of antineoplastic chemotherapy: Secondary | ICD-10-CM | POA: Insufficient documentation

## 2016-04-25 DIAGNOSIS — J45909 Unspecified asthma, uncomplicated: Secondary | ICD-10-CM | POA: Diagnosis not present

## 2016-04-25 DIAGNOSIS — Z87891 Personal history of nicotine dependence: Secondary | ICD-10-CM | POA: Insufficient documentation

## 2016-04-25 DIAGNOSIS — K5900 Constipation, unspecified: Secondary | ICD-10-CM | POA: Diagnosis not present

## 2016-04-25 DIAGNOSIS — I700 Atherosclerosis of aorta: Secondary | ICD-10-CM | POA: Diagnosis not present

## 2016-04-25 DIAGNOSIS — R0902 Hypoxemia: Secondary | ICD-10-CM | POA: Diagnosis not present

## 2016-04-25 DIAGNOSIS — K5730 Diverticulosis of large intestine without perforation or abscess without bleeding: Secondary | ICD-10-CM | POA: Diagnosis not present

## 2016-04-25 DIAGNOSIS — Z90710 Acquired absence of both cervix and uterus: Secondary | ICD-10-CM | POA: Insufficient documentation

## 2016-04-25 DIAGNOSIS — Z923 Personal history of irradiation: Secondary | ICD-10-CM | POA: Insufficient documentation

## 2016-04-25 LAB — CBC
HCT: 39.7 % (ref 35.0–47.0)
Hemoglobin: 13.9 g/dL (ref 12.0–16.0)
MCH: 33.4 pg (ref 26.0–34.0)
MCHC: 35 g/dL (ref 32.0–36.0)
MCV: 95.4 fL (ref 80.0–100.0)
PLATELETS: 170 10*3/uL (ref 150–440)
RBC: 4.16 MIL/uL (ref 3.80–5.20)
RDW: 15.5 % — ABNORMAL HIGH (ref 11.5–14.5)
WBC: 9.9 10*3/uL (ref 3.6–11.0)

## 2016-04-25 LAB — URINALYSIS COMPLETE WITH MICROSCOPIC (ARMC ONLY)
BILIRUBIN URINE: NEGATIVE
Bacteria, UA: NONE SEEN
Glucose, UA: NEGATIVE mg/dL
HGB URINE DIPSTICK: NEGATIVE
Ketones, ur: NEGATIVE mg/dL
Nitrite: NEGATIVE
Protein, ur: 30 mg/dL — AB
SPECIFIC GRAVITY, URINE: 1.019 (ref 1.005–1.030)
pH: 7 (ref 5.0–8.0)

## 2016-04-25 LAB — BASIC METABOLIC PANEL
Anion gap: 9 (ref 5–15)
BUN: 23 mg/dL — ABNORMAL HIGH (ref 6–20)
CHLORIDE: 100 mmol/L — AB (ref 101–111)
CO2: 29 mmol/L (ref 22–32)
CREATININE: 1.26 mg/dL — AB (ref 0.44–1.00)
Calcium: 9.1 mg/dL (ref 8.9–10.3)
GFR calc non Af Amer: 40 mL/min — ABNORMAL LOW (ref >60)
GFR, EST AFRICAN AMERICAN: 46 mL/min — AB (ref >60)
Glucose, Bld: 107 mg/dL — ABNORMAL HIGH (ref 65–99)
Potassium: 4.2 mmol/L (ref 3.5–5.1)
Sodium: 138 mmol/L (ref 135–145)

## 2016-04-25 LAB — HEPATIC FUNCTION PANEL
ALK PHOS: 59 U/L (ref 38–126)
ALT: 15 U/L (ref 14–54)
AST: 20 U/L (ref 15–41)
Albumin: 4.1 g/dL (ref 3.5–5.0)
BILIRUBIN DIRECT: 0.2 mg/dL (ref 0.1–0.5)
BILIRUBIN INDIRECT: 1.1 mg/dL — AB (ref 0.3–0.9)
BILIRUBIN TOTAL: 1.3 mg/dL — AB (ref 0.3–1.2)
Total Protein: 7.5 g/dL (ref 6.5–8.1)

## 2016-04-25 LAB — LIPASE, BLOOD: Lipase: 31 U/L (ref 11–51)

## 2016-04-25 MED ORDER — CEFTRIAXONE SODIUM 1 G IJ SOLR
1.0000 g | INTRAMUSCULAR | Status: DC
  Administered 2016-04-26 – 2016-04-28 (×3): 1 g via INTRAVENOUS
  Filled 2016-04-25 (×3): qty 10

## 2016-04-25 MED ORDER — POLYETHYLENE GLYCOL 3350 17 GM/SCOOP PO POWD
17.0000 g | Freq: Every day | ORAL | Status: DC | PRN
  Filled 2016-04-25: qty 255

## 2016-04-25 MED ORDER — SODIUM CHLORIDE 0.9 % IV SOLN
INTRAVENOUS | Status: DC
  Administered 2016-04-25 – 2016-04-27 (×3): via INTRAVENOUS

## 2016-04-25 MED ORDER — MORPHINE SULFATE (PF) 4 MG/ML IV SOLN
4.0000 mg | Freq: Once | INTRAVENOUS | Status: AC
  Administered 2016-04-25: 4 mg via INTRAVENOUS
  Filled 2016-04-25: qty 1

## 2016-04-25 MED ORDER — HYDROCODONE-ACETAMINOPHEN 5-325 MG PO TABS
1.0000 | ORAL_TABLET | ORAL | Status: DC | PRN
  Administered 2016-04-25 – 2016-04-27 (×9): 2 via ORAL
  Administered 2016-04-29: 03:00:00 1 via ORAL
  Filled 2016-04-25 (×2): qty 2
  Filled 2016-04-25: qty 1
  Filled 2016-04-25 (×7): qty 2

## 2016-04-25 MED ORDER — ALBUTEROL SULFATE (2.5 MG/3ML) 0.083% IN NEBU
3.0000 mL | INHALATION_SOLUTION | RESPIRATORY_TRACT | Status: DC
  Administered 2016-04-25 – 2016-04-26 (×2): 3 mL via RESPIRATORY_TRACT
  Filled 2016-04-25 (×3): qty 6

## 2016-04-25 MED ORDER — BISACODYL 5 MG PO TBEC
5.0000 mg | DELAYED_RELEASE_TABLET | Freq: Every day | ORAL | Status: DC | PRN
  Administered 2016-04-25 – 2016-04-29 (×4): 5 mg via ORAL
  Filled 2016-04-25 (×5): qty 1

## 2016-04-25 MED ORDER — ASPIRIN EC 81 MG PO TBEC
81.0000 mg | DELAYED_RELEASE_TABLET | Freq: Every morning | ORAL | Status: DC
  Administered 2016-04-25 – 2016-04-29 (×4): 81 mg via ORAL
  Filled 2016-04-25 (×5): qty 1

## 2016-04-25 MED ORDER — THEOPHYLLINE ER 300 MG PO CP24
300.0000 mg | ORAL_CAPSULE | Freq: Every morning | ORAL | Status: DC
  Administered 2016-04-25 – 2016-04-29 (×4): 300 mg via ORAL
  Filled 2016-04-25 (×6): qty 1

## 2016-04-25 MED ORDER — DOCUSATE SODIUM 100 MG PO CAPS
100.0000 mg | ORAL_CAPSULE | Freq: Two times a day (BID) | ORAL | Status: DC
  Administered 2016-04-25 – 2016-04-26 (×3): 100 mg via ORAL
  Filled 2016-04-25 (×3): qty 1

## 2016-04-25 MED ORDER — CEFTRIAXONE SODIUM 1 G IJ SOLR
1.0000 g | Freq: Once | INTRAMUSCULAR | Status: AC
  Administered 2016-04-25: 1 g via INTRAVENOUS
  Filled 2016-04-25: qty 10

## 2016-04-25 MED ORDER — METOPROLOL SUCCINATE ER 50 MG PO TB24
25.0000 mg | ORAL_TABLET | Freq: Every morning | ORAL | Status: DC
  Administered 2016-04-25 – 2016-04-29 (×4): 25 mg via ORAL
  Filled 2016-04-25 (×5): qty 1

## 2016-04-25 MED ORDER — TRAZODONE HCL 50 MG PO TABS
25.0000 mg | ORAL_TABLET | Freq: Every evening | ORAL | Status: DC | PRN
  Filled 2016-04-25: qty 1

## 2016-04-25 MED ORDER — ACETAMINOPHEN 650 MG RE SUPP: 650.0000 mg | Freq: Four times a day (QID) | RECTAL | Status: DC | PRN

## 2016-04-25 MED ORDER — ACETAMINOPHEN 325 MG PO TABS
650.0000 mg | ORAL_TABLET | Freq: Four times a day (QID) | ORAL | Status: DC | PRN
  Administered 2016-04-28 – 2016-04-29 (×3): 650 mg via ORAL
  Filled 2016-04-25 (×3): qty 2

## 2016-04-25 MED ORDER — PREGABALIN 50 MG PO CAPS: 50.0000 mg | ORAL_CAPSULE | Freq: Three times a day (TID) | ORAL | Status: DC

## 2016-04-25 MED ORDER — MORPHINE SULFATE (PF) 4 MG/ML IV SOLN
4.0000 mg | Freq: Once | INTRAVENOUS | Status: AC
  Administered 2016-04-25: 4 mg via INTRAVENOUS

## 2016-04-25 MED ORDER — ONDANSETRON HCL 4 MG/2ML IJ SOLN
4.0000 mg | Freq: Four times a day (QID) | INTRAMUSCULAR | Status: DC | PRN
  Administered 2016-04-25 – 2016-04-27 (×4): 4 mg via INTRAVENOUS
  Filled 2016-04-25 (×3): qty 2

## 2016-04-25 MED ORDER — LOSARTAN POTASSIUM 25 MG PO TABS
50.0000 mg | ORAL_TABLET | Freq: Every morning | ORAL | Status: DC
  Administered 2016-04-25 – 2016-04-29 (×4): 50 mg via ORAL
  Filled 2016-04-25 (×6): qty 2

## 2016-04-25 MED ORDER — ADULT MULTIVITAMIN W/MINERALS CH
1.0000 | ORAL_TABLET | Freq: Every morning | ORAL | Status: DC
  Administered 2016-04-27 – 2016-04-29 (×3): 1 via ORAL
  Filled 2016-04-25 (×4): qty 1

## 2016-04-25 MED ORDER — PANTOPRAZOLE SODIUM 40 MG PO TBEC
40.0000 mg | DELAYED_RELEASE_TABLET | Freq: Every day | ORAL | Status: DC
Start: 2016-04-25 — End: 2016-04-29
  Administered 2016-04-25 – 2016-04-29 (×5): 40 mg via ORAL
  Filled 2016-04-25 (×5): qty 1

## 2016-04-25 MED ORDER — ONDANSETRON HCL 4 MG PO TABS: 4.0000 mg | ORAL_TABLET | Freq: Four times a day (QID) | ORAL | Status: DC | PRN

## 2016-04-25 MED ORDER — POLYETHYLENE GLYCOL 3350 17 G PO PACK
17.0000 g | PACK | Freq: Every day | ORAL | Status: DC | PRN
  Administered 2016-04-27: 13:00:00 17 g via ORAL
  Filled 2016-04-25: qty 1

## 2016-04-25 MED ORDER — ENOXAPARIN SODIUM 40 MG/0.4ML ~~LOC~~ SOLN
40.0000 mg | SUBCUTANEOUS | Status: DC
  Administered 2016-04-25 – 2016-04-27 (×3): 40 mg via SUBCUTANEOUS
  Filled 2016-04-25 (×3): qty 0.4

## 2016-04-25 MED ORDER — DICYCLOMINE HCL 10 MG PO CAPS
10.0000 mg | ORAL_CAPSULE | Freq: Two times a day (BID) | ORAL | Status: DC | PRN
Start: 2016-04-25 — End: 2016-04-29
  Administered 2016-04-25 (×2): 10 mg via ORAL
  Filled 2016-04-25 (×4): qty 1

## 2016-04-25 MED ORDER — MOMETASONE FURO-FORMOTEROL FUM 200-5 MCG/ACT IN AERO
2.0000 | INHALATION_SPRAY | Freq: Two times a day (BID) | RESPIRATORY_TRACT | Status: DC
  Administered 2016-04-25 – 2016-04-29 (×9): 2 via RESPIRATORY_TRACT
  Filled 2016-04-25: qty 8.8

## 2016-04-25 NOTE — ED Notes (Signed)
o2 sat on room air 85%-88%. Placed on 2L o2. Pt states that she wears home o2 at night

## 2016-04-25 NOTE — Plan of Care (Signed)
Problem: Pain Managment: Goal: General experience of comfort will improve Outcome: Not Progressing Cont to have rt  Sided  Spasm pain. Med given  Problem: Skin Integrity: Goal: Risk for impaired skin integrity will decrease Outcome: Progressing No skin issues  Problem: Tissue Perfusion: Goal: Risk factors for ineffective tissue perfusion will decrease Outcome: Progressing none  Problem: Fluid Volume: Goal: Ability to maintain a balanced intake and output will improve Outcome: Progressing ivfs at 50 ml / hr  Problem: Nutrition: Goal: Adequate nutrition will be maintained Outcome: Not Progressing Small appetite at present  N/v. Med given  Problem: Respiratory: Goal: Ability to maintain adequate ventilation will improve Outcome: Progressing svns and inhaler plus resp meds

## 2016-04-25 NOTE — ED Notes (Signed)
Pt eating her lunch tray at bedside; states that she feels better. Given new cup of water. Denies nausea at this time.

## 2016-04-25 NOTE — ED Triage Notes (Signed)
Patient presents to the ED with right flank pain since Thursday night.  Patient states pain woke her up during the night.  Patient is holding her right flank and appears very uncomfortable.  Patient reports nausea and vomiting x 1 yesterday.  Patient reports difficulty knowing about any urinary issues due to history of bladder cancer and bladder issues at baseline.

## 2016-04-25 NOTE — ED Notes (Signed)
Admitting MD at bedside.

## 2016-04-25 NOTE — H&P (Signed)
[name] NAME: Catherine Landry    MR#:  [id number]  DATE OF BIRTH:  [birth year]  DATE OF ADMISSION:  04/25/2016  PRIMARY CARE PHYSICIAN: Tracie Harrier, MD   REQUESTING/REFERRING PHYSICIAN: Archie Balboa  CHIEF COMPLAINT: Right flank pain    Chief Complaint  Patient presents with  . Flank Pain    HISTORY OF PRESENT ILLNESS:  Catherine Landry  is a 80 y.o. female with a known history of  bladder Cancer status post chemotherapy, radiation, history of left nephrectomy due to kidney cancer comes in because of right flank pain started since Thursday. Patient called primary doctor and he said it probably a muscle spasm and the patient took  Muscle relaxants. Patient also thought it  May be something related to gas and she also took some antacid. But none of them  Helped/she came to emergency room. In the emergency room patient received morphine, IV Rocephin. Urine showed a lot of bacteria. Admitting her for UTI and right flank pain. She'll complains of some nausea, chills and severe right flank pain more with movement. Patient has chronic cough and chronic shortness of breath secondary to COPD. Uses 2 L of oxygen at night.  In  ER today  Patient  O2 sats 81% on room air so started on 2 L of oxygen. Asian denies dysuria or hematuria but has frequency of urination. She complains of dark urine for the past 3-4 days.  PAST MEDICAL HISTORY:   Past Medical History:  Diagnosis Date  . Asthma   . Bladder cancer ([place]) 2015  . Cancer [facility]) 2001   kidney left   . Hypertension   . Renal insufficiency   h/o of bladder cancer status post chemotherapy, radiation gets recurrent UTIs also.  PAST SURGICAL HISTOIRY:   Past Surgical History:  Procedure Laterality Date  . BACK SURGERY     history of left nephrectomy, appendectomy, total abdominal hysterectomy,  SOCIAL HISTORY:   Social History  Substance Use Topics  . Smoking status: Former  Smoker    Packs/day: 1.50    Quit date: 12/09/1999  . Smokeless tobacco: Never Used  . Alcohol use No    FAMILY HISTORY:   Family History  Problem Relation Age of Onset  . Breast cancer Mother 65    DRUG ALLERGIES:  No Known Allergies  REVIEW OF SYSTEMS:  CONSTITUTIONAL: No fever, fatigue or weakness.  EYES: No blurred or double vision.  EARS, NOSE, AND THROAT: No tinnitus or ear pain.  RESPIRATORY: No cough, shortness of breath, wheezing or hemoptysis.  CARDIOVASCULAR: No chest pain, orthopnea, edema.  GASTROINTESTINAL: Some nausea and right flank pain. No diarrhea or constipation.  GENITOURINARY: No dysuria, hematuria.  ENDOCRINE: No polyuria, nocturia,  HEMATOLOGY: No anemia, easy bruising or bleeding SKIN: No rash or lesion. MUSCULOSKELETAL: No joint pain or arthritis.   NEUROLOGIC: No tingling, numbness, weakness.  PSYCHIATRY: No anxiety or depression.   MEDICATIONS AT HOME:   Prior to Admission medications   Medication Sig Start Date End Date Taking? Authorizing Provider  ADVAIR DISKUS 250-50 MCG/DOSE AEPB Inhale 1 puff into the lungs 2 (two) times daily. *Rinse mouth after use* 09/18/15  Yes Historical Provider, MD  albuterol (PROVENTIL) (2.5 MG/3ML) 0.083% nebulizer solution Take 3-6 mLs by nebulization every 6 (six) hours as needed. For shortness of breath/wheezing. 08/22/15  Yes Historical Provider, MD  aspirin EC 81 MG tablet Take 81 mg by mouth every morning.   Yes Historical Provider, MD  Calcium-Magnesium-Vitamin D (CALCIUM 1200+D3 PO) Take 1 tablet by mouth every morning.   Yes Historical Provider, MD  COMBIVENT RESPIMAT 20-100 MCG/ACT AERS respimat Inhale 1 puff into the lungs 4 (four) times daily as needed. For COPD. 09/16/15  Yes Historical Provider, MD  dicyclomine (BENTYL) 10 MG capsule Take 10 mg by mouth 2 (two) times daily as needed for muscle spasms. For up to 10 days 04/24/16 05/04/16 Yes Historical Provider, MD  losartan (COZAAR) 50 MG tablet Take 50 mg  by mouth every morning.   Yes Historical Provider, MD  metoprolol succinate (TOPROL-XL) 25 MG 24 hr tablet Take 25 mg by mouth every morning.   Yes Historical Provider, MD  Multiple Vitamin (MULTIVITAMIN WITH MINERALS) TABS Take 1 tablet by mouth every morning.   Yes Historical Provider, MD  omeprazole (PRILOSEC) 40 MG capsule Take 40 mg by mouth 3 (three) times a week.  09/21/15  Yes Historical Provider, MD  polyethylene glycol powder (GLYCOLAX/MIRALAX) powder Take 17 g by mouth daily as needed for constipation. Mix with 4 to 8 ounces of fluid. 04/10/16  Yes Historical Provider, MD  theophylline (THEO-24) 300 MG 24 hr capsule Take 1 capsule (300 mg total) by mouth every morning. 10/12/15  Yes Bettey Costa, MD  cefUROXime (CEFTIN) 250 MG tablet  09/06/15   Historical Provider, MD  NEOMYCIN-POLYMYXIN-HYDROCORTISONE (CORTISPORIN) 1 % SOLN otic solution Apply 1-2 drops to toe BID after soaking Patient not taking: Reported on 04/25/2016 12/11/15   Max T Hyatt, DPM  predniSONE (DELTASONE) 20 MG tablet Take 2 tablets (40 mg total) by mouth daily with breakfast. Patient not taking: Reported on 04/25/2016 04/08/16   Flora Lipps, MD  pregabalin (LYRICA) 50 MG capsule Take 1 capsule (50 mg total) by mouth 3 (three) times daily. Patient not taking: Reported on 04/25/2016 11/13/15   Max T Hyatt, DPM      VITAL SIGNS:  Blood pressure 133/65, pulse 82, temperature 97.8 F (36.6 C), temperature source Oral, resp. rate (!) 22, height 5\' 4"  (1.626 m), weight 65.8 kg (145 lb), SpO2 98 %.  PHYSICAL EXAMINATION:  GENERAL:  80 y.o.-year-old patient lying in the bed with no acute distress.  EYES: Pupils equal, round, reactive to light and accommodation. No scleral icterus. Extraocular muscles intact.  HEENT: Head atraumatic, normocephalic. Oropharynx and nasopharynx clear.  NECK:  Supple, no jugular venous distention. No thyroid enlargement, no tenderness.  LUNGS: Normal breath sounds bilaterally, no wheezing, rales,rhonchi  or crepitation. No use of accessory muscles of respiration.  CARDIOVASCULAR: S1, S2 normal. No murmurs, rubs, or gallops.  ABDOMEN: Soft, nontender, nondistended. Bowel sounds present. No organomegaly or mass. Right  CVA tenderness present  EXTREMITIES: No pedal edema, cyanosis, or clubbing.  NEUROLOGIC: Cranial nerves II through XII are intact. Muscle strength 5/5 in all extremities. Sensation intact. Gait not checked.  PSYCHIATRIC: The patient is alert and oriented x 3.  SKIN: No obvious rash, lesion, or ulcer.   LABORATORY PANEL:   CBC  Recent Labs Lab 04/25/16 0915  WBC 9.9  HGB 13.9  HCT 39.7  PLT 170   ------------------------------------------------------------------------------------------------------------------  Chemistries   Recent Labs Lab 04/25/16 0915  NA 138  K 4.2  CL 100*  CO2 29  GLUCOSE 107*  BUN 23*  CREATININE 1.26*  CALCIUM 9.1  AST 20  ALT 15  ALKPHOS 59  BILITOT 1.3*   ------------------------------------------------------------------------------------------------------------------  Cardiac Enzymes No results for input(s): TROPONINI in the last 168 hours. ------------------------------------------------------------------------------------------------------------------  RADIOLOGY:  Ct Renal Stone Study  Result Date: 04/25/2016 CLINICAL DATA:  Right flank pain since Thursday night. Nausea and vomiting yesterday. EXAM: CT ABDOMEN AND PELVIS WITHOUT CONTRAST TECHNIQUE: Multidetector CT imaging of the abdomen and pelvis was performed following the standard protocol without IV contrast. COMPARISON:  CT abdomen dated 06/18/2009. FINDINGS: Lower chest:  No acute findings. Hepatobiliary: Peripheral contours of the liver are slightly nodular suggesting early or mild cirrhosis. Gallbladder appears normal. Pancreas: No mass or inflammatory process identified on this un-enhanced exam. Spleen: Within normal limits in size. Adrenals/Urinary Tract: Low-density  mass within the right adrenal gland measures 1.8 cm, compatible with benign adrenal adenoma by CT density measurements. Left adrenal gland appears normal. Left kidney is surgically absent. Right kidney appears normal without mass, stone or hydronephrosis. No ureteral or bladder calculi identified. Bladder is decompressed. Stomach/Bowel: Bowel is normal in caliber. No evidence of bowel wall inflammation. Scattered diverticulosis within the sigmoid and descending colon but no inflammatory change to suggest acute diverticulitis. Moderate amount of stool and gas throughout the nondistended colon. Stomach is unremarkable. Vascular/Lymphatic: Heavy atherosclerotic changes of the normal caliber abdominal aorta and pelvic vasculature. Reproductive: Presumed hysterectomy. Other: No free fluid or abscess collections seen. No free intraperitoneal air. Musculoskeletal: Right hip arthroplasty hardware in place. Degenerative changes within the slightly scoliotic thoracolumbar spine and at the left hip. No acute or suspicious osseous finding. IMPRESSION: 1. No acute findings within the abdomen or pelvis. Right kidney appears normal without stone or hydronephrosis. No perinephric inflammation. No ureteral or bladder calculi. Left kidney is surgically absent, without evidence of surgical complicating feature. 2. Colonic diverticulosis without evidence of acute diverticulitis. 3. Probable liver cirrhosis. 4. Moderate amount of stool and gas throughout the nondistended colon (constipation? ). 5. Aortic atherosclerosis. 6. Degenerative changes throughout the scoliotic thoracolumbar spine, moderate in degree. No evidence of acute osseous abnormality. Electronically Signed   By: Franki Cabot M.D.   On: 04/25/2016 12:33    EKG:   Orders placed or performed in visit on 07/06/07  . EKG 12-Lead    IMPRESSION AND PLAN:   #1 right flank pain secondary to UTI: Admit to observation status, started on Rocephin,  Continue  Rocephin.  follow urine cultures, and use Tylenol, Norco as needed for pain control for right flank pain . Ct with renal protocol negative for any kidney stones are hydronephrosis.  #2 dehydration: Continue IV fluids. #3 history of COPD: No wheezing. Continue home medications including Advair, albuterol. #4 hypoxia likely due to pain . Chest xray  is not done: Will order one. patient has a history of COPD and on chronic oxygen 2 L at the nighttime.. 5 GI, DVT prophylaxis.  All the records are reviewed and case discussed with ED provider. Management plans discussed with the patient, family and they are in agreement.  CODE STATUS: Full code  TOTAL TIME TAKING CARE OF THIS PATIENT: 55 minutes.    [name] M.D on 04/25/2016 at 2:15 PM  Between 7am to 6pm - Pager - [phone]  After 6pm go to [email or web address] - password [email or web address] [facility]  Office  [phone]  CC: Primary care physician; Tracie Harrier, MD  Note: This dictation was prepared with Dragon dictation along with smaller phrase technology. Any transcriptional errors that result from this process are unintentional.

## 2016-04-25 NOTE — ED Notes (Signed)
Patient transported to CT 

## 2016-04-25 NOTE — ED Notes (Signed)
Amanda, RN

## 2016-04-25 NOTE — ED Provider Notes (Signed)
[facility] Emergency Department Provider Note    ____________________________________________   I have reviewed the triage vital signs and the nursing notes.   HISTORY  Chief Complaint Flank Pain   History limited by: Not Limited   HPI Catherine Landry is a 80 y.o. female who presented to the emergency department today because of concerns for flank pain. It is located on the right side. It started 2 days ago. It will come and go. It will be severe. It is sharp. It has not moved locations. She did have some nausea and vomiting associated with it. She has not noticed any change in defecation or urination. She denies any fevers.   Past Medical History:  Diagnosis Date  . Asthma   . Bladder cancer ([place]) 2015  . Cancer [facility]) 2001   kidney left   . Hypertension   . Renal insufficiency     Patient Active Problem List   Diagnosis Date Noted  . COPD exacerbation ([place]) 10/06/2015    Past Surgical History:  Procedure Laterality Date  . BACK SURGERY      Prior to Admission medications   Medication Sig Start Date End Date Taking? Authorizing Provider  ADVAIR DISKUS 250-50 MCG/DOSE AEPB Inhale 1 puff into the lungs 2 (two) times daily. *Rinse mouth after use* 09/18/15   Historical Provider, MD  albuterol (PROVENTIL) (2.5 MG/3ML) 0.083% nebulizer solution Take 3-6 mLs by nebulization every 6 (six) hours as needed. For shortness of breath/wheezing. 08/22/15   Historical Provider, MD  aspirin EC 81 MG tablet Take 81 mg by mouth every morning.    Historical Provider, MD  Calcium-Magnesium-Vitamin D (CALCIUM 1200+D3 PO) Take 1 tablet by mouth every morning.    Historical Provider, MD  cefUROXime (CEFTIN) 250 MG tablet  09/06/15   Historical Provider, MD  colchicine 0.6 MG tablet  10/23/15   Historical Provider, MD  COMBIVENT RESPIMAT 20-100 MCG/ACT AERS respimat Inhale 1 puff into the lungs 4 (four) times daily as needed. For COPD. 09/16/15   Historical  Provider, MD  losartan (COZAAR) 50 MG tablet Take 50 mg by mouth every morning.    Historical Provider, MD  metoprolol succinate (TOPROL-XL) 25 MG 24 hr tablet Take 25 mg by mouth every morning.    Historical Provider, MD  montelukast (SINGULAIR) 10 MG tablet Reported on 11/27/2015 08/06/15   Historical Provider, MD  Multiple Vitamin (MULTIVITAMIN WITH MINERALS) TABS Take 1 tablet by mouth every morning.    Historical Provider, MD  NEOMYCIN-POLYMYXIN-HYDROCORTISONE (CORTISPORIN) 1 % SOLN otic solution Apply 1-2 drops to toe BID after soaking 12/11/15   Max T Hyatt, DPM  omeprazole (PRILOSEC) 40 MG capsule Take 40 mg by mouth every morning. 09/21/15   Historical Provider, MD  predniSONE (DELTASONE) 20 MG tablet Take 2 tablets (40 mg total) by mouth daily with breakfast. 04/08/16   Flora Lipps, MD  pregabalin (LYRICA) 50 MG capsule Take 1 capsule (50 mg total) by mouth 3 (three) times daily. 11/13/15   Max T Hyatt, DPM  theophylline (THEO-24) 300 MG 24 hr capsule Take 1 capsule (300 mg total) by mouth every morning. 10/12/15   Bettey Costa, MD    Allergies Review of patient's allergies indicates no known allergies.  Family History  Problem Relation Age of Onset  . Breast cancer Mother 44    Social History Social History  Substance Use Topics  . Smoking status: Former Smoker    Packs/day: 1.50    Quit date: 12/09/1999  . Smokeless  tobacco: Never Used  . Alcohol use No    Review of Systems  Constitutional: Negative for fever. Cardiovascular: Negative for chest pain. Respiratory: Negative for shortness of breath. Gastrointestinal: Positive for right flank pain Genitourinary: Negative for dysuria. Musculoskeletal: Negative for back pain. Skin: Negative for rash. Neurological: Negative for headaches, focal weakness or numbness.   10-point ROS otherwise negative.  ____________________________________________   PHYSICAL EXAM:  VITAL SIGNS: ED Triage Vitals [04/25/16 0913]  Enc Vitals  Group     BP (!) 156/76     Pulse Rate 87     Resp 20     Temp 97.8 F (36.6 C)     Temp Source Oral     SpO2 94 %     Weight 145 lb (65.8 kg)     Height 5\' 4"  (1.626 m)     Head Circumference      Peak Flow      Pain Score 10   Constitutional: Alert and oriented. Will have periodic spasms of pain. Eyes: Conjunctivae are normal. PERRL. Normal extraocular movements. ENT   Head: Normocephalic and atraumatic.   Nose: No congestion/rhinnorhea.   Mouth/Throat: Mucous membranes are moist.   Neck: No stridor. Hematological/Lymphatic/Immunilogical: No cervical lymphadenopathy. Cardiovascular: Normal rate, regular rhythm.  No murmurs, rubs, or gallops. Respiratory: Normal respiratory effort without tachypnea nor retractions. Breath sounds are clear and equal bilaterally. No wheezes/rales/rhonchi. Gastrointestinal: Soft and nontender. No distention. There is no CVA tenderness. Genitourinary: Deferred Musculoskeletal: Normal range of motion in all extremities. No joint effusions.  No lower extremity tenderness nor edema. Neurologic:  Normal speech and language. No gross focal neurologic deficits are appreciated.  Skin:  Skin is warm, dry and intact. No rash noted. Psychiatric: Mood and affect are normal. Speech and behavior are normal. Patient exhibits appropriate insight and judgment.  ____________________________________________    LABS (pertinent positives/negatives)  Labs Reviewed  URINALYSIS COMPLETEWITH MICROSCOPIC ([facility] ONLY) - Abnormal; Notable for the following:       Result Value   Color, Urine YELLOW (*)    APPearance HAZY (*)    Protein, ur 30 (*)    Leukocytes, UA 3+ (*)    Squamous Epithelial / LPF 0-5 (*)    All other components within normal limits  BASIC METABOLIC PANEL - Abnormal; Notable for the following:    Chloride 100 (*)    Glucose, Bld 107 (*)    BUN 23 (*)    Creatinine, Ser 1.26 (*)    GFR calc non Af Amer 40 (*)    GFR calc Af Amer 46  (*)    All other components within normal limits  CBC - Abnormal; Notable for the following:    RDW 15.5 (*)    All other components within normal limits  HEPATIC FUNCTION PANEL - Abnormal; Notable for the following:    Total Bilirubin 1.3 (*)    Indirect Bilirubin 1.1 (*)    All other components within normal limits  URINE CULTURE  LIPASE, BLOOD     ____________________________________________   EKG  None  ____________________________________________    RADIOLOGY  CT renal IMPRESSION: 1. No acute findings within the abdomen or pelvis. Right kidney appears normal without stone or hydronephrosis. No perinephric inflammation. No ureteral or bladder calculi. Left kidney is surgically absent, without evidence of surgical complicating feature. 2. Colonic diverticulosis without evidence of acute diverticulitis. 3. Probable liver cirrhosis. 4. Moderate amount of stool and gas throughout the nondistended colon (constipation? ). 5. Aortic atherosclerosis. 6.  Degenerative changes throughout the scoliotic thoracolumbar spine, moderate in degree. No evidence of acute osseous abnormality.  ____________________________________________   PROCEDURES  Procedures  ____________________________________________   INITIAL IMPRESSION / ASSESSMENT AND PLAN / ED COURSE  Pertinent labs & imaging results that were available during my care of the patient were reviewed by me and considered in my medical decision making (see chart for details).  Patient presented to the emergency department today because of concerns for right flank pain. Of concern patient only has one kidney on the right side. Patient states she does have a history of kidney stone on the left side. Will get blood work and CT renal stone study.  Clinical Course   CT renal without any concerning findings. Urine however does appear to be infected. I do have concerns for pyelonephritis and a sore kidney. Will plan on  giving IV antibiotics and admission to hospital service. ____________________________________________   FINAL CLINICAL IMPRESSION(S) / ED DIAGNOSES  Final diagnoses:  Right flank pain  UTI (lower urinary tract infection)     Note: This dictation was prepared with Dragon dictation. Any transcriptional errors that result from this process are unintentional    Nance Pear, MD 04/25/16 1400

## 2016-04-25 NOTE — Care Management Obs Status (Signed)
[place] NOTIFICATION   Patient Details  Name: Janiece Pion MRN: [id number] Date of Birth: 1936-09-13   Medicare Observation Status Notification Given:  Yes    Ival Bible, RN 04/25/2016, 2:57 PM

## 2016-04-25 NOTE — ED Notes (Signed)
Pt feeling nauseated PRN medication administered. Pt assisted to bathroom.

## 2016-04-25 NOTE — ED Notes (Signed)
Right flank pain since Thursday, pain better when holding pressure to right flank. Frequent urination.

## 2016-04-25 NOTE — ED Notes (Signed)
Pt back from Ct, requesting something to drink. Instructed to await until CT resulted and will reassess.

## 2016-04-26 DIAGNOSIS — N390 Urinary tract infection, site not specified: Secondary | ICD-10-CM | POA: Diagnosis not present

## 2016-04-26 LAB — GLUCOSE, CAPILLARY: Glucose-Capillary: 84 mg/dL (ref 65–99)

## 2016-04-26 LAB — URINE CULTURE

## 2016-04-26 LAB — BASIC METABOLIC PANEL
Anion gap: 7 (ref 5–15)
BUN: 25 mg/dL — AB (ref 6–20)
CALCIUM: 8.5 mg/dL — AB (ref 8.9–10.3)
CO2: 31 mmol/L (ref 22–32)
CREATININE: 1.13 mg/dL — AB (ref 0.44–1.00)
Chloride: 99 mmol/L — ABNORMAL LOW (ref 101–111)
GFR calc Af Amer: 53 mL/min — ABNORMAL LOW (ref >60)
GFR, EST NON AFRICAN AMERICAN: 45 mL/min — AB (ref >60)
Glucose, Bld: 98 mg/dL (ref 65–99)
Potassium: 4.1 mmol/L (ref 3.5–5.1)
SODIUM: 137 mmol/L (ref 135–145)

## 2016-04-26 LAB — CBC
HCT: 35.3 % (ref 35.0–47.0)
Hemoglobin: 12.1 g/dL (ref 12.0–16.0)
MCH: 33.3 pg (ref 26.0–34.0)
MCHC: 34.4 g/dL (ref 32.0–36.0)
MCV: 96.8 fL (ref 80.0–100.0)
PLATELETS: 145 10*3/uL — AB (ref 150–440)
RBC: 3.65 MIL/uL — ABNORMAL LOW (ref 3.80–5.20)
RDW: 15 % — AB (ref 11.5–14.5)
WBC: 7.1 10*3/uL (ref 3.6–11.0)

## 2016-04-26 MED ORDER — PROMETHAZINE HCL 25 MG/ML IJ SOLN
12.5000 mg | Freq: Four times a day (QID) | INTRAMUSCULAR | Status: DC | PRN
  Administered 2016-04-26: 12:00:00 12.5 mg via INTRAVENOUS
  Filled 2016-04-26: qty 1

## 2016-04-26 MED ORDER — ALUM & MAG HYDROXIDE-SIMETH 200-200-20 MG/5ML PO SUSP
30.0000 mL | ORAL | Status: DC | PRN
  Administered 2016-04-26: 30 mL via ORAL
  Filled 2016-04-26: qty 30

## 2016-04-26 MED ORDER — SENNOSIDES-DOCUSATE SODIUM 8.6-50 MG PO TABS
1.0000 | ORAL_TABLET | Freq: Two times a day (BID) | ORAL | Status: DC
  Administered 2016-04-26 – 2016-04-29 (×7): 1 via ORAL
  Filled 2016-04-26 (×7): qty 1

## 2016-04-26 MED ORDER — ALBUTEROL SULFATE (2.5 MG/3ML) 0.083% IN NEBU
3.0000 mL | INHALATION_SOLUTION | Freq: Four times a day (QID) | RESPIRATORY_TRACT | Status: DC
  Administered 2016-04-26 – 2016-04-27 (×5): 3 mL via RESPIRATORY_TRACT
  Filled 2016-04-26 (×5): qty 6

## 2016-04-26 MED ORDER — ALBUTEROL SULFATE (2.5 MG/3ML) 0.083% IN NEBU
2.5000 mg | INHALATION_SOLUTION | RESPIRATORY_TRACT | Status: DC | PRN
Start: 2016-04-26 — End: 2016-04-28

## 2016-04-26 NOTE — Progress Notes (Signed)
Patient is alert and oriented, c/o persistent n/v, c/o pain in right flank treated with hydrocodone, zofran ineffective in managing n/v, phenergan given- patient drowsy and sleeping heavy following phenergan administration, on 2 l nasal canula, husband at bedside, diet decreased to full liquid secondary to nausea, patient bathed at sink during shift. Blood pressure medications held in am secondary to low bp.

## 2016-04-26 NOTE — Progress Notes (Signed)
Notified Dr. Margaretmary Eddy that patient is persistently nauseated with emesis, per MD give 12.5 mg IV q6h prn promethazine and change diet to full liquid.. Telephone order.

## 2016-04-26 NOTE — Progress Notes (Signed)
[place] at [place] NAME: Catherine Landry    MR#:  [id number]  DATE OF BIRTH:  09-07-1936  SUBJECTIVE:  CHIEF COMPLAINT:  Pt is c/o flank pain, nausea and vomited once.   REVIEW OF SYSTEMS:  CONSTITUTIONAL: No fever, fatigue or weakness.  EYES: No blurred or double vision.  EARS, NOSE, AND THROAT: No tinnitus or ear pain.  RESPIRATORY: No cough, shortness of breath, wheezing or hemoptysis.  CARDIOVASCULAR: No chest pain, orthopnea, edema.  GASTROINTESTINAL: Has nausea, some vomiting,denies diarrhea , lower abdominal pain.  Has GENITOURINARY: No dysuria, hematuria.  ENDOCRINE: No polyuria, nocturia,  HEMATOLOGY: No anemia, easy bruising or bleeding SKIN: No rash or lesion. MUSCULOSKELETAL: No joint pain or arthritis.   NEUROLOGIC: No tingling, numbness, weakness.  PSYCHIATRY: No anxiety or depression.   DRUG ALLERGIES:  No Known Allergies  VITALS:  Blood pressure (!) 114/48, pulse 70, temperature 98.8 F (37.1 C), temperature source Oral, resp. rate 20, height 5\' 4"  (1.626 m), weight 66.7 kg (147 lb), SpO2 100 %.  PHYSICAL EXAMINATION:  GENERAL:  80 y.o.-year-old patient lying in the bed with no acute distress.  EYES: Pupils equal, round, reactive to light and accommodation. No scleral icterus. Extraocular muscles intact.  HEENT: Head atraumatic, normocephalic. Oropharynx and nasopharynx clear.  NECK:  Supple, no jugular venous distention. No thyroid enlargement, no tenderness.  LUNGS: Normal breath sounds bilaterally, no wheezing, rales,rhonchi or crepitation. No use of accessory muscles of respiration.  CARDIOVASCULAR: S1, S2 normal. No murmurs, rubs, or gallops.  ABDOMEN: Soft, lower abd tenderness, nondistended. Bowel sounds present. No organomegaly or mass. Flank tenderness is present EXTREMITIES: No pedal edema, cyanosis, or clubbing.  NEUROLOGIC: Cranial nerves II through XII are intact. Muscle strength 5/5 in all  extremities. Sensation intact. Gait not checked.  PSYCHIATRIC: The patient is alert and oriented x 3.  SKIN: No obvious rash, lesion, or ulcer.    LABORATORY PANEL:   CBC  Recent Labs Lab 04/26/16 0559  WBC 7.1  HGB 12.1  HCT 35.3  PLT 145*   ------------------------------------------------------------------------------------------------------------------  Chemistries   Recent Labs Lab 04/25/16 0915 04/26/16 0559  NA 138 137  K 4.2 4.1  CL 100* 99*  CO2 29 31  GLUCOSE 107* 98  BUN 23* 25*  CREATININE 1.26* 1.13*  CALCIUM 9.1 8.5*  AST 20  --   ALT 15  --   ALKPHOS 59  --   BILITOT 1.3*  --    ------------------------------------------------------------------------------------------------------------------  Cardiac Enzymes No results for input(s): TROPONINI in the last 168 hours. ------------------------------------------------------------------------------------------------------------------  RADIOLOGY:  Dg Chest 1 View  Result Date: 04/25/2016 CLINICAL DATA:  Patient with shortness of breath.  Right flank pain. EXAM: CHEST 1 VIEW COMPARISON:  CT chest 10/10/2015; chest radiograph 10/06/2015 FINDINGS: Stable cardiac and mediastinal contours with tortuosity and calcification of the thoracic aorta. No consolidative pulmonary opacities. No pleural effusion or pneumothorax. IMPRESSION: No acute cardiopulmonary process. Aortic vascular calcification. Electronically Signed   By: Lovey Newcomer M.D.   On: 04/25/2016 15:35   Ct Renal Stone Study  Result Date: 04/25/2016 CLINICAL DATA:  Right flank pain since Thursday night. Nausea and vomiting yesterday. EXAM: CT ABDOMEN AND PELVIS WITHOUT CONTRAST TECHNIQUE: Multidetector CT imaging of the abdomen and pelvis was performed following the standard protocol without IV contrast. COMPARISON:  CT abdomen dated 06/18/2009. FINDINGS: Lower chest:  No acute findings. Hepatobiliary: Peripheral contours of the liver are slightly nodular  suggesting early or mild cirrhosis. Gallbladder  appears normal. Pancreas: No mass or inflammatory process identified on this un-enhanced exam. Spleen: Within normal limits in size. Adrenals/Urinary Tract: Low-density mass within the right adrenal gland measures 1.8 cm, compatible with benign adrenal adenoma by CT density measurements. Left adrenal gland appears normal. Left kidney is surgically absent. Right kidney appears normal without mass, stone or hydronephrosis. No ureteral or bladder calculi identified. Bladder is decompressed. Stomach/Bowel: Bowel is normal in caliber. No evidence of bowel wall inflammation. Scattered diverticulosis within the sigmoid and descending colon but no inflammatory change to suggest acute diverticulitis. Moderate amount of stool and gas throughout the nondistended colon. Stomach is unremarkable. Vascular/Lymphatic: Heavy atherosclerotic changes of the normal caliber abdominal aorta and pelvic vasculature. Reproductive: Presumed hysterectomy. Other: No free fluid or abscess collections seen. No free intraperitoneal air. Musculoskeletal: Right hip arthroplasty hardware in place. Degenerative changes within the slightly scoliotic thoracolumbar spine and at the left hip. No acute or suspicious osseous finding. IMPRESSION: 1. No acute findings within the abdomen or pelvis. Right kidney appears normal without stone or hydronephrosis. No perinephric inflammation. No ureteral or bladder calculi. Left kidney is surgically absent, without evidence of surgical complicating feature. 2. Colonic diverticulosis without evidence of acute diverticulitis. 3. Probable liver cirrhosis. 4. Moderate amount of stool and gas throughout the nondistended colon (constipation? ). 5. Aortic atherosclerosis. 6. Degenerative changes throughout the scoliotic thoracolumbar spine, moderate in degree. No evidence of acute osseous abnormality. Electronically Signed   By: Franki Cabot M.D.   On: 04/25/2016 12:33     EKG:   Orders placed or performed in visit on 07/06/07  . EKG 12-Lead    ASSESSMENT AND PLAN:   #1 right flank pain secondary to UTI:    Continue  Rocephin.  follow urine cultures  Tylenol, Norco as needed for pain control for right flank pain .  Ct with renal protocol negative for any kidney stones or hydronephrosis.  #2 dehydration:  Continue IV fluids.  #3 history of COPD:  No wheezing. Continue home medications including Advair, albuterol.  #4 hypoxia likely due to pain .  Resolved  Chest xray with no active disease patient has a history of COPD and on chronic oxygen 2 L at the nighttime..  5 GI, DVT prophylaxis     All the records are reviewed and case discussed with Care Management/Social Workerr. Management plans discussed with the patient, family and they are in agreement.  CODE STATUS: fc  TOTAL TIME TAKING CARE OF THIS PATIENT: 36 minutes.   POSSIBLE D/C IN 2 DAYS, DEPENDING ON CLINICAL CONDITION.  Note: This dictation was prepared with Dragon dictation along with smaller phrase technology. Any transcriptional errors that result from this process are unintentional.   Nicholes Mango M.D on 04/26/2016 at 7:01 PM  Between 7am to 6pm - Pager - [phone] After 6pm go to [email or web address] - password [email or web address] [facility]  Office  [phone]  CC: Primary care physician; Tracie Harrier, MD

## 2016-04-27 DIAGNOSIS — N390 Urinary tract infection, site not specified: Secondary | ICD-10-CM | POA: Diagnosis not present

## 2016-04-27 LAB — GLUCOSE, CAPILLARY: Glucose-Capillary: 89 mg/dL (ref 65–99)

## 2016-04-27 MED ORDER — MINERAL OIL RE ENEM
1.0000 | ENEMA | Freq: Once | RECTAL | Status: AC
  Administered 2016-04-27: 1 via RECTAL

## 2016-04-27 MED ORDER — ALBUTEROL SULFATE (2.5 MG/3ML) 0.083% IN NEBU
3.0000 mL | INHALATION_SOLUTION | Freq: Three times a day (TID) | RESPIRATORY_TRACT | Status: DC
  Administered 2016-04-27 (×2): 3 mL via RESPIRATORY_TRACT
  Filled 2016-04-27 (×2): qty 6

## 2016-04-27 MED ORDER — PHENAZOPYRIDINE HCL 100 MG PO TABS
100.0000 mg | ORAL_TABLET | Freq: Once | ORAL | Status: DC
  Filled 2016-04-27 (×2): qty 1

## 2016-04-27 MED ORDER — PHENAZOPYRIDINE HCL 100 MG PO TABS
100.0000 mg | ORAL_TABLET | Freq: Three times a day (TID) | ORAL | Status: DC
  Administered 2016-04-28 (×2): 100 mg via ORAL
  Filled 2016-04-27 (×3): qty 1

## 2016-04-27 MED ORDER — GUAIFENESIN-DM 100-10 MG/5ML PO SYRP
5.0000 mL | ORAL_SOLUTION | ORAL | Status: DC | PRN
  Administered 2016-04-27 – 2016-04-28 (×3): 5 mL via ORAL
  Filled 2016-04-27 (×3): qty 5

## 2016-04-27 MED ORDER — MORPHINE SULFATE (PF) 2 MG/ML IV SOLN: 1.0000 mg | Freq: Four times a day (QID) | INTRAVENOUS | Status: DC | PRN

## 2016-04-27 NOTE — Progress Notes (Signed)
[place] at [place] NAME: Catherine Landry    MR#:  [id number]  DATE OF BIRTH:  06-04-1936  SUBJECTIVE:  CHIEF COMPLAINT:  Pt is stillc/o flank pain, nauseaStill but tolerating liquid diet. Denies any vomiting. Reporting low back pain. Not feeling good.  REVIEW OF SYSTEMS:  CONSTITUTIONAL: No fever, fatigue or weakness.  EYES: No blurred or double vision.  EARS, NOSE, AND THROAT: No tinnitus or ear pain.  RESPIRATORY: No cough, shortness of breath, wheezing or hemoptysis.  CARDIOVASCULAR: No chest pain, orthopnea, edema.  GASTROINTESTINAL: Has nausea, some vomiting,denies diarrhea , lower abdominal pain.  HasFlank pain GENITOURINARY: No dysuria, hematuria.  ENDOCRINE: No polyuria, nocturia,  HEMATOLOGY: No anemia, easy bruising or bleeding SKIN: No rash or lesion. MUSCULOSKELETAL: No joint pain or arthritis.   NEUROLOGIC: No tingling, numbness, weakness.  PSYCHIATRY: No anxiety or depression.   DRUG ALLERGIES:  No Known Allergies  VITALS:  Blood pressure (!) 110/49, pulse 79, temperature 99.2 F (37.3 C), temperature source Oral, resp. rate 16, height 5\' 4"  (1.626 m), weight 74.8 kg (164 lb 12.8 oz), SpO2 (!) 80 %.  PHYSICAL EXAMINATION:  GENERAL:  80 y.o.-year-old patient lying in the bed with no acute distress.  EYES: Pupils equal, round, reactive to light and accommodation. No scleral icterus. Extraocular muscles intact.  HEENT: Head atraumatic, normocephalic. Oropharynx and nasopharynx clear.  NECK:  Supple, no jugular venous distention. No thyroid enlargement, no tenderness.  LUNGS: Normal breath sounds bilaterally, no wheezing, rales,rhonchi or crepitation. No use of accessory muscles of respiration.  CARDIOVASCULAR: S1, S2 normal. No murmurs, rubs, or gallops.  ABDOMEN: Soft, lower abd tenderness, nondistended. Bowel sounds present. No organomegaly or mass. Flank tenderness is present EXTREMITIES: No pedal edema,  cyanosis, or clubbing.  NEUROLOGIC: Cranial nerves II through XII are intact. Muscle strength 5/5 in all extremities. Sensation intact. Gait not checked.  PSYCHIATRIC: The patient is alert and oriented x 3.  SKIN: No obvious rash, lesion, or ulcer.    LABORATORY PANEL:   CBC  Recent Labs Lab 04/26/16 0559  WBC 7.1  HGB 12.1  HCT 35.3  PLT 145*   ------------------------------------------------------------------------------------------------------------------  Chemistries   Recent Labs Lab 04/25/16 0915 04/26/16 0559  NA 138 137  K 4.2 4.1  CL 100* 99*  CO2 29 31  GLUCOSE 107* 98  BUN 23* 25*  CREATININE 1.26* 1.13*  CALCIUM 9.1 8.5*  AST 20  --   ALT 15  --   ALKPHOS 59  --   BILITOT 1.3*  --    ------------------------------------------------------------------------------------------------------------------  Cardiac Enzymes No results for input(s): TROPONINI in the last 168 hours. ------------------------------------------------------------------------------------------------------------------  RADIOLOGY:  Dg Chest 1 View  Result Date: 04/25/2016 CLINICAL DATA:  Patient with shortness of breath.  Right flank pain. EXAM: CHEST 1 VIEW COMPARISON:  CT chest 10/10/2015; chest radiograph 10/06/2015 FINDINGS: Stable cardiac and mediastinal contours with tortuosity and calcification of the thoracic aorta. No consolidative pulmonary opacities. No pleural effusion or pneumothorax. IMPRESSION: No acute cardiopulmonary process. Aortic vascular calcification. Electronically Signed   By: Lovey Newcomer M.D.   On: 04/25/2016 15:35    EKG:   Orders placed or performed in visit on 07/06/07  . EKG 12-Lead    ASSESSMENT AND PLAN:   #1 right flank pain secondary to UTI:    Continue  Rocephin.  follow urine cultures-Contaminated. Patient being clinically sick will repeat urine culture and sensitivity and continue current IV antibiotics  Tylenol, Norco as needed  for pain control  for right flank pain .  Ct with renal protocol negative for any kidney stones or hydronephrosis.  #2 dehydration:  Continue IV fluids.  #3 history of COPD:  No wheezing. Continue home medications including Advair, albuterol.  #4 hypoxia likely due to pain .  Resolved  Chest xray with no active disease patient has a history of COPD and on chronic oxygen 2 L at the nighttime..  5 GI, DVT prophylaxis  6. Low back pain mostly from UTI Pain meds as needed and PT consult  All the records are reviewed and case discussed with Care Management/Social Workerr. Management plans discussed with the patient, family and they are in agreement.  CODE STATUS: fc  TOTAL TIME TAKING CARE OF THIS PATIENT: 36 minutes.   POSSIBLE D/C IN am DAYS, DEPENDING ON CLINICAL CONDITION.  Note: This dictation was prepared with Dragon dictation along with smaller phrase technology. Any transcriptional errors that result from this process are unintentional.   Nicholes Mango M.D on 04/27/2016 at 1:29 PM  Between 7am to 6pm - Pager - [phone] After 6pm go to [email or web address] - password [email or web address] [facility]  Office  [phone]  CC: Primary care physician; Tracie Harrier, MD

## 2016-04-27 NOTE — Evaluation (Signed)
Physical Therapy Evaluation Patient Details Name: Catherine Landry MRN: [id number] DOB: 20-Feb-1936 Today's Date: 04/27/2016   History of Present Illness  presented to ER with flank pain, nausea/vomiting; admitted under observation secondary to UTI.  Clinical Impression  Upon evaluation, patient alert and oriented; follows all commands and demonstrates fair/good insight and safety awareness.  Bilat UE/LE strength and ROM grossly symmetrical and WFL for basic transfers and mobility; mild soreness reported in back (unrated).  Able to complete bed mobility indep; sit/stand, basic transfers and gait (30') without assist device, cga/min assist; improved to cga/close sup with use of RW (40').  Higher-level balance deficits evident with mobility (R lateral LOB requiring assist from therapist for recovery).  Do recommend continued use of RW for all mobility at this time.  Patient voiced understanding/agreement. Patient currently requiring supplemental O2 at 2L with intermittent standing rest periods (cuing for activity pacing) to maintain sats >88% with exertion; may need more continuous O2 in home environment (wearing nocturnally only prior to admission).  RN informed/aware. Would benefit from skilled PT to address above deficits and promote optimal return to PLOF; Recommend transition to [place] upon discharge from acute hospitalization.  SaO2 on room air while ambulating = 80% SaO2 on 2 liters of O2 while ambulating = 87% SaO2 on 2 liters of O2 at rest = 93%      Follow Up Recommendations Home health PT    Equipment Recommendations  Rolling walker with 5" wheels    Recommendations for Other Services       Precautions / Restrictions Precautions Precautions: Fall Restrictions Weight Bearing Restrictions: No      Mobility  Bed Mobility Overal bed mobility: Modified Independent                Transfers Overall transfer level: Needs assistance Equipment used: Rolling walker (2  wheeled) Transfers: Sit to/from Stand Sit to Stand: Min guard         General transfer comment: cuing for hand placement; tends to pull up on RW  Ambulation/Gait Ambulation/Gait assistance: Min guard Ambulation Distance (Feet): 30 Feet Assistive device: None       General Gait Details: short, choppy steps with broad BOS; limited heel strike/toe off; excessive weight shift to R LE in stance phase.  R lateral LOB x1 requiring min/mod assist from therapist to recover.  Desat to 80-81% on RA with exertion; requiring application of 2L supplemental O2 for sats >87-88% with gait.  Stairs            Wheelchair Mobility    Modified Rankin (Stroke Patients Only)       Balance Overall balance assessment: Needs assistance Sitting-balance support: No upper extremity supported;Feet supported Sitting balance-Leahy Scale: Good     Standing balance support: No upper extremity supported Standing balance-Leahy Scale: Fair  Delayed ankle/hip strategies noted with standing balance activities; heavily relying on LE step strategy (and assist from therapist) for recovery                             Pertinent Vitals/Pain Pain Assessment: Faces Faces Pain Scale: Hurts little more Pain Location: back, abdomen Pain Intervention(s): Limited activity within patient's tolerance;Monitored during session;Repositioned    Home Living Family/patient expects to be discharged to:: Private residence Living Arrangements: Spouse/significant other Available Help at Discharge: Family;Available 24 hours/day Type of Home: House Home Access: Stairs to enter Entrance Stairs-Rails: None Entrance Stairs-Number of Steps: 1 Home Layout: One level Home  Equipment: Gilford Rile - 4 wheels;Cane - single point      Prior Function Level of Independence: Independent         Comments: Independent with ADLs/IADLs, household and community mobility.  + driving; denies fall history.  Pt reports  intermittent DOE     Hand Dominance        Extremity/Trunk Assessment   Upper Extremity Assessment: Overall WFL for tasks assessed (grossly at least 4+/5 throughout)           Lower Extremity Assessment: Overall WFL for tasks assessed (grossly at least 4-/5 throughout)         Communication   Communication: No difficulties  Cognition Arousal/Alertness: Awake/alert Behavior During Therapy: WFL for tasks assessed/performed Overall Cognitive Status: Within Functional Limits for tasks assessed                      General Comments      Exercises Other Exercises Other Exercises: 32' with RW, cga--more consistent step height/length with more fluid gait pattern; improved balance and overall safety with use of RW.  Do recommend continued use of RW with all mobility at this time for both safety and energy conservation. Other Exercises: Seated LE therex, 1x10, AROM for muscular strength/endurance.  min verbal cuing for movement throughout full range and correct technique.      Assessment/Plan    PT Assessment Patient needs continued PT services  PT Diagnosis Difficulty walking;Generalized weakness   PT Problem List Decreased strength;Decreased activity tolerance;Decreased balance;Decreased mobility;Decreased knowledge of use of DME;Cardiopulmonary status limiting activity  PT Treatment Interventions DME instruction;Gait training;Stair training;Functional mobility training;Therapeutic activities;Therapeutic exercise;Patient/family education;Balance training   PT Goals (Current goals can be found in the Care Plan section) Acute Rehab PT Goals Patient Stated Goal: to go home, but not until tomorrow PT Goal Formulation: With patient Time For Goal Achievement: 05/11/16 Potential to Achieve Goals: Good    Frequency Min 2X/week   Barriers to discharge Decreased caregiver support      Co-evaluation               End of Session Equipment Utilized During  Treatment: Gait belt;Oxygen Activity Tolerance: Patient tolerated treatment well Patient left: in chair;with call bell/phone within reach;with chair alarm set Nurse Communication: Mobility status    Functional Assessment Tool Used: clinical judgement Functional Limitation: Mobility: Walking and moving around Mobility: Walking and Moving Around Current Status [id number]): At least 20 percent but less than 40 percent impaired, limited or restricted Mobility: Walking and Moving Around Goal Status [phone]): At least 1 percent but less than 20 percent impaired, limited or restricted    Time: 1208-1235 PT Time Calculation (min) (ACUTE ONLY): 27 min   Charges:   PT Evaluation $PT Eval Low Complexity: 1 Procedure PT Treatments $Gait Training: 8-22 mins   PT G Codes:   PT G-Codes **NOT FOR INPATIENT CLASS** Functional Assessment Tool Used: clinical judgement Functional Limitation: Mobility: Walking and moving around Mobility: Walking and Moving Around Current Status [id number]): At least 20 percent but less than 40 percent impaired, limited or restricted Mobility: Walking and Moving Around Goal Status [id number]): At least 1 percent but less than 20 percent impaired, limited or restricted    Jaylin Roundy H. Owens Shark, PT, DPT, NCS 04/27/16, 12:28 PM [phone]

## 2016-04-27 NOTE — Progress Notes (Signed)
Care taken over by De Hollingshead, RN.

## 2016-04-27 NOTE — Care Management (Signed)
Admitted to [facility] with the diagnosis of urinary tract infection under observation status. Lives with husband, Oris Drone [phone]). Last seen Dr. Ginette Pitman 6 weeks ago. Home oxygen at night only. 2 Liters per nasal cannula since January 2017 per [place]. No skilled facility. Cane available, if needed. Increased appetite. No falls. Prescriptions are filled at [facility] on [facility]. Takes care of all basic and instrumental activities of daily living herself, drives. Husband will transport Physical therapy evaluation completed. Recommended home with home health/therapy. [place]. Floydene Flock, [facility] representative updated. Discharge tomorrow per Dr. Margaretmary Eddy. Shelbie Ammons RN MSN CCM Care Management [phone]

## 2016-04-28 ENCOUNTER — Observation Stay: Payer: Medicare Other

## 2016-04-28 DIAGNOSIS — N390 Urinary tract infection, site not specified: Secondary | ICD-10-CM | POA: Diagnosis not present

## 2016-04-28 LAB — GLUCOSE, CAPILLARY: GLUCOSE-CAPILLARY: 103 mg/dL — AB (ref 65–99)

## 2016-04-28 LAB — URINE CULTURE: CULTURE: NO GROWTH

## 2016-04-28 MED ORDER — ALBUTEROL SULFATE (2.5 MG/3ML) 0.083% IN NEBU
3.0000 mL | INHALATION_SOLUTION | Freq: Four times a day (QID) | RESPIRATORY_TRACT | Status: DC | PRN
  Administered 2016-04-28 – 2016-04-29 (×2): 3 mL via RESPIRATORY_TRACT
  Filled 2016-04-28 (×2): qty 6

## 2016-04-28 MED ORDER — CEPHALEXIN 250 MG PO CAPS
500.0000 mg | ORAL_CAPSULE | Freq: Two times a day (BID) | ORAL | Status: DC
  Administered 2016-04-29: 09:00:00 500 mg via ORAL
  Filled 2016-04-28: qty 2

## 2016-04-28 NOTE — Progress Notes (Signed)
Physical Therapy Treatment Patient Details Name: Catherine Landry MRN: [id number] DOB: Oct 04, 1935 Today's Date: 04/28/2016    History of Present Illness presented to ER with flank pain, nausea/vomiting; admitted under observation secondary to UTI.    PT Comments    Pt is a pleasant 80 y/o female, in chair upon PT arrival. Pt reports she has increased pain in abdomen when she coughs and it "feels as though there is a cracker in my throat". RN notified of pain and pt's request to speak to MD regarding cough. Pt requires min guard with transfers and ambulation. Verbal cueing to push off of arm rests with transfers and to reach back for arm rests upon sitting. Pt on RA upon PT arrival in seated at rest: 87%, PT re-applied O2. Pt on 2L O2 for ambulation around nurses station, 5 standing rest breaks needed. First rest break O2 desat to 84%-87%; pt reports slight SOB; O2 increased to 90-92% within 60 seconds with instruction on pursed lip breathing. Increased O2 to 3L with rest of ambulation, O2 90%-94%. Pt requires verbal cueing to keep RW close to body, especially when fatigued. O2 monitored with seated ther-ex, 91%-94% on 2 L O2. Pt will benefit from skilled PT services in order to improve strength, balance, functional mobility, endurance, and safety with DME use. Pt is progressing towards her goals and continues to be appropriate for HHPT.   Follow Up Recommendations  Home health PT     Equipment Recommendations  Rolling walker with 5" wheels    Recommendations for Other Services       Precautions / Restrictions Precautions Precautions: Fall Restrictions Weight Bearing Restrictions: No    Mobility  Bed Mobility               General bed mobility comments: Pt in chair upon PT arrival, bed mobility not assessed.  Transfers Overall transfer level: Needs assistance Equipment used: Rolling walker (2 wheeled) Transfers: Sit to/from Stand Sit to Stand: Min guard         General  transfer comment: Verbal cuing for hand placement to push from arm rest upon standing and to reach for arm rests before sitting.   Ambulation/Gait Ambulation/Gait assistance: Min guard Ambulation Distance (Feet): 200 Feet Assistive device: Rolling walker (2 wheeled) Gait Pattern/deviations: Step-through pattern;Decreased step length - right;Decreased step length - left;Decreased stride length;Trunk flexed Gait velocity: 9 ft/sec Gait velocity interpretation: Below normal speed for age/gender General Gait Details: Pt demonstrates decreased stride length and requires moderate verbal cueing to keep RW close to body. Pt ambulates around the nurses station with 5 standing rest breaks to monitor O2 sats, slight SOB noted. Ambulation on 2LO2, desat to 84%-87%, standing rest break and verbal cueing for pursed lip breathing technique, O2 sats back in low 90s within 60 seconds. O2 increased to 3L with ambulation, O2 maintained at 90%-94%.    Stairs            Wheelchair Mobility    Modified Rankin (Stroke Patients Only)       Balance Overall balance assessment: Needs assistance Sitting-balance support: No upper extremity supported;Feet supported Sitting balance-Leahy Scale: Good     Standing balance support: Bilateral upper extremity supported Standing balance-Leahy Scale: Good Standing balance comment: Pt able to maintain standing balance with B UE support on RW.                    Cognition Arousal/Alertness: Awake/alert Behavior During Therapy: WFL for tasks assessed/performed Overall Cognitive Status:  Within Functional Limits for tasks assessed                      Exercises Other Exercises Other Exercises: Seated ther-ex: B marches, hip abd/add, heel raises, LAQs x 8 reps with verbal cueing for proper technique, with supervision.    General Comments        Pertinent Vitals/Pain Pain Assessment: Faces Faces Pain Scale: Hurts even more Pain Location:  Abdomen Pain Descriptors / Indicators: Sharp Pain Intervention(s): Limited activity within patient's tolerance;Monitored during session;Other (comment) (RN notified)    Home Living                      Prior Function            PT Goals (current goals can now be found in the care plan section) Acute Rehab PT Goals Patient Stated Goal: To return home PT Goal Formulation: With patient Time For Goal Achievement: 05/11/16 Potential to Achieve Goals: Good Progress towards PT goals: Progressing toward goals    Frequency  Min 2X/week    PT Plan Current plan remains appropriate    Co-evaluation             End of Session Equipment Utilized During Treatment: Gait belt;Oxygen Activity Tolerance: Patient tolerated treatment well;Patient limited by fatigue Patient left: in chair;with call bell/phone within reach;with chair alarm set;with family/visitor present     Time: [id number] PT Time Calculation (min) (ACUTE ONLY): 30 min  Charges:                       G Codes:      Georgina Pillion 2016-05-16, 4:56 PM  Georgina Pillion, SPT [phone]

## 2016-04-28 NOTE — Progress Notes (Signed)
[place] at [place] NAME: Catherine Landry    MR#:  [id number]  DATE OF BIRTH:  05-Nov-1935  SUBJECTIVE: Simona Huh of back pain in the middle of the back also some right leg pain. Complaints of constipation, last BM was last Tuesday. Complains of abdominal cramps. Tried on now multiple stool softeners and laxatives without bowel movement yet   CHIEF COMPLAINT:    REVIEW OF SYSTEMS:  CONSTITUTIONAL: No fever, fatigue or weakness.  EYES: No blurred or double vision.  EARS, NOSE, AND THROAT: No tinnitus or ear pain.  RESPIRATORY: No cough, shortness of breath, wheezing or hemoptysis.  CARDIOVASCULAR: No chest pain, orthopnea, edema.  GASTROINTESTINAL: Abdominal pain, constipation. No nausea. Patient has back pain more on the right flank area. Denies any dysuria.  GENITOURINARY: No dysuria, hematuria.  ENDOCRINE: No polyuria, nocturia,  HEMATOLOGY: No anemia, easy bruising or bleeding SKIN: No rash or lesion. MUSCULOSKELETAL: No joint pain or arthritis.   NEUROLOGIC: No tingling, numbness, weakness.  PSYCHIATRY: No anxiety or depression.   DRUG ALLERGIES:  No Known Allergies  VITALS:  Blood pressure (!) 133/58, pulse 79, temperature 98.6 F (37 C), resp. rate 18, height 5\' 4"  (1.626 m), weight 74.8 kg (164 lb 12.8 oz), SpO2 96 %.  PHYSICAL EXAMINATION:  GENERAL:  80 y.o.-year-old patient lying in the bed with no acute distress.  EYES: Pupils equal, round, reactive to light and accommodation. No scleral icterus. Extraocular muscles intact.  HEENT: Head atraumatic, normocephalic. Oropharynx and nasopharynx clear.  NECK:  Supple, no jugular venous distention. No thyroid enlargement, no tenderness.  LUNGS: Normal breath sounds bilaterally, no wheezing, rales,rhonchi or crepitation. No use of accessory muscles of respiration.  CARDIOVASCULAR: S1, S2 normal. No murmurs, rubs, or gallops.  ABDOMEN: Soft, bowel sounds are diminished.  Generalized tenderness present but no rebound tenderness . No CVA tenderness EXTREMITIES: No pedal edema, cyanosis, or clubbing.  NEUROLOGIC: Cranial nerves II through XII are intact. Muscle strength 5/5 in all extremities. Sensation intact. Gait not checked.  PSYCHIATRIC: The patient is alert and oriented x 3.  SKIN: No obvious rash, lesion, or ulcer.    LABORATORY PANEL:   CBC  Recent Labs Lab 04/26/16 0559  WBC 7.1  HGB 12.1  HCT 35.3  PLT 145*   ------------------------------------------------------------------------------------------------------------------  Chemistries   Recent Labs Lab 04/25/16 0915 04/26/16 0559  NA 138 137  K 4.2 4.1  CL 100* 99*  CO2 29 31  GLUCOSE 107* 98  BUN 23* 25*  CREATININE 1.26* 1.13*  CALCIUM 9.1 8.5*  AST 20  --   ALT 15  --   ALKPHOS 59  --   BILITOT 1.3*  --    ------------------------------------------------------------------------------------------------------------------  Cardiac Enzymes No results for input(s): TROPONINI in the last 168 hours. ------------------------------------------------------------------------------------------------------------------  RADIOLOGY:  No results found.  EKG:   Orders placed or performed in visit on 07/06/07  . EKG 12-Lead    ASSESSMENT AND PLAN:   #1 right flank pain secondary to UTI:    Continue  Rocephin. Rpt urine cultures are ordered as original one is contaminated, continue Rocephin. Patient has no fever, no leukocytosis.  Tylenol, Norco as needed for pain control for right flank pain .  Ct with renal protocol negative for any kidney stones or hydronephrosis.  #2 dehydration:  Continue IV fluids.  #3 history of COPD:  No wheezing. Continue home medications including Advair, albuterol.  #4 hypoxia likely due to pain .  Resolved  Chest  xray with no active disease patient has a history of COPD and on chronic oxygen 2 L at the nighttime..  5 GI, DVT prophylaxis  6.  Low back pain likely musculoskeletal, no need for PT as pt very active,amblatory at baseline.  #7 constipation continue stool softeners, check abdominal x-ray today.   All the records are reviewed and case discussed with Care Management/Social Workerr. Management plans discussed with the patient, family and they are in agreement.  CODE STATUS: fc  TOTAL TIME TAKING CARE OF THIS PATIENT: 36 minutes.   POSSIBLE D/C IN am DAYS, DEPENDING ON CLINICAL CONDITION.  Note: This dictation was prepared with Dragon dictation along with smaller phrase technology. Any transcriptional errors that result from this process are unintentional.   Worth Kober M.D on 04/28/2016 at 8:57 AM  Between 7am to 6pm - Pager - [phone] After 6pm go to [email or web address] - password [email or web address] [place]  Office  [phone]  CC: Primary care physician; Tracie Harrier, MD

## 2016-04-29 DIAGNOSIS — N390 Urinary tract infection, site not specified: Secondary | ICD-10-CM | POA: Diagnosis not present

## 2016-04-29 LAB — GLUCOSE, CAPILLARY: Glucose-Capillary: 102 mg/dL — ABNORMAL HIGH (ref 65–99)

## 2016-04-29 MED ORDER — PROMETHAZINE HCL 25 MG/ML IJ SOLN: 12.5000 mg | Freq: Four times a day (QID) | INTRAMUSCULAR | 0 refills | Status: DC | PRN

## 2016-04-29 MED ORDER — BISACODYL 5 MG PO TBEC: 5.0000 mg | DELAYED_RELEASE_TABLET | Freq: Every day | ORAL | 0 refills | Status: DC | PRN

## 2016-04-29 NOTE — Progress Notes (Signed)
[place] at [place] NAME: Catherine Landry    MR#:  [id number]  DATE OF BIRTH:  Jul 05, 1936  SUBJECTIVE: She had a small  BM yesterday. Ready for discharge today   CHIEF COMPLAINT:    REVIEW OF SYSTEMS:  CONSTITUTIONAL: No fever, fatigue or weakness.  EYES: No blurred or double vision.  EARS, NOSE, AND THROAT: No tinnitus or ear pain.  RESPIRATORY: No cough, shortness of breath, wheezing or hemoptysis.  CARDIOVASCULAR: No chest pain, orthopnea, edema.  GASTROINTESTINAL: Abdominal pain, constipation. No nausea. Patient has back pain more on the right flank area. Denies any dysuria.  GENITOURINARY: No dysuria, hematuria.  ENDOCRINE: No polyuria, nocturia,  HEMATOLOGY: No anemia, easy bruising or bleeding SKIN: No rash or lesion. MUSCULOSKELETAL: No joint pain or arthritis.   NEUROLOGIC: No tingling, numbness, weakness.  PSYCHIATRY: No anxiety or depression.   DRUG ALLERGIES:  No Known Allergies  VITALS:  Blood pressure (!) 116/56, pulse 77, temperature 98.2 F (36.8 C), resp. rate 16, height 5\' 4"  (1.626 m), weight 74.8 kg (164 lb 12.8 oz), SpO2 (!) 89 %.  PHYSICAL EXAMINATION:  GENERAL:  80 y.o.-year-old patient lying in the bed with no acute distress.  EYES: Pupils equal, round, reactive to light and accommodation. No scleral icterus. Extraocular muscles intact.  HEENT: Head atraumatic, normocephalic. Oropharynx and nasopharynx clear.  NECK:  Supple, no jugular venous distention. No thyroid enlargement, no tenderness.  LUNGS: Normal breath sounds bilaterally, no wheezing, rales,rhonchi or crepitation. No use of accessory muscles of respiration.  CARDIOVASCULAR: S1, S2 normal. No murmurs, rubs, or gallops.  ABDOMEN: Soft, bowel sounds are diminished. Generalized tenderness present but no rebound tenderness . No CVA tenderness EXTREMITIES: No pedal edema, cyanosis, or clubbing.  NEUROLOGIC: Cranial nerves II through XII are  intact. Muscle strength 5/5 in all extremities. Sensation intact. Gait not checked.  PSYCHIATRIC: The patient is alert and oriented x 3.  SKIN: No obvious rash, lesion, or ulcer.    LABORATORY PANEL:   CBC  Recent Labs Lab 04/26/16 0559  WBC 7.1  HGB 12.1  HCT 35.3  PLT 145*   ------------------------------------------------------------------------------------------------------------------  Chemistries   Recent Labs Lab 04/25/16 0915 04/26/16 0559  NA 138 137  K 4.2 4.1  CL 100* 99*  CO2 29 31  GLUCOSE 107* 98  BUN 23* 25*  CREATININE 1.26* 1.13*  CALCIUM 9.1 8.5*  AST 20  --   ALT 15  --   ALKPHOS 59  --   BILITOT 1.3*  --    ------------------------------------------------------------------------------------------------------------------  Cardiac Enzymes No results for input(s): TROPONINI in the last 168 hours. ------------------------------------------------------------------------------------------------------------------  RADIOLOGY:  Dg Abd 1 View  Result Date: 04/28/2016 CLINICAL DATA:  Constipation and flank pain. History of bladder cancer in 2015. Renal insufficiency. Ex-smoker. EXAM: ABDOMEN - 1 VIEW COMPARISON:  Abdomen pelvis CT dated 04/25/2016. FINDINGS: Normal bowel gas pattern. Prominent stool in the right and transverse colon. Left nephrectomy surgical clips. Bilateral pelvic surgical clips. Bilateral pelvic phleboliths. No calcified urinary tract calculi seen. Right total hip prosthesis. Atheromatous arterial calcifications including the abdominal aorta. Moderate levoconvex lumbar rotary scoliosis and degenerative changes. IMPRESSION: 1. Prominent stool in the right and transverse colon. 2. Aortic atherosclerosis. Electronically Signed   By: Claudie Revering M.D.   On: 04/28/2016 11:00    EKG:   Orders placed or performed in visit on 07/06/07  . EKG 12-Lead    ASSESSMENT AND PLAN:   #1 Back pain, abdominal cramps  secondary to constipation.  Abnormal UA on admission but urine cultures are negative. Patient received a Rocephin, Keflex. No further need for antibiotics at home because patient urine analysis on admission did not show any bacteria, no fever, WBC is normal.    #2 dehydration: Received IV fluids.  #3 history of COPD:  No wheezing. Continue home medications including Advair, albuterol.  #4 hypoxia likely due to pain .  Resolved  Chest xray with no active disease patient has a history of COPD and on chronic oxygen 2 L at the nighttime..  5 GI, DVT prophylaxis  6. Low back pain likely musculoskeletal, no need for PT as pt very active,amblatory at baseline.  #7 constipation continue stool softeners Therapy recommended home health physical therapy but patient refused. Patient adamant that she wants to go home  disCharge him today. Follow with primary doctor in 1 week   All the records are reviewed and case discussed with Care Management/Social Workerr. Management plans discussed with the patient, family and they are in agreement.  CODE STATUS: fc  TOTAL TIME TAKING CARE OF THIS PATIENT: 32 minutes.     Note: This dictation was prepared with Dragon dictation along with smaller phrase technology. Any transcriptional errors that result from this process are unintentional.   Catherine Landry M.D on 04/29/2016 at 9:16 AM  Between 7am to 6pm - Pager - [phone] After 6pm go to [email or web address] - password [email or web address] [facility] [facility]  Office  [phone]  CC: Primary care physician; Catherine Harrier, MD

## 2016-04-29 NOTE — Progress Notes (Signed)
Discussed discharge instructions and medications with pt and her husband.  IV removed.  All questions answered.  Pt transported home via car by her husband.  Clarise Cruz, RN

## 2016-04-29 NOTE — Care Management (Signed)
Patient has very firmly declined any need for home health nurse or physical therapy.  Notified [facility].

## 2016-05-02 NOTE — Discharge Summary (Signed)
Catherine Landry, is a 80 y.o. female  DOB 11/23/1935  MRN [id number].  Admission date:  04/25/2016  Admitting Physician  [name], MD  Discharge Date:  05/02/2016   Primary MD  Tracie Harrier, MD  Recommendations for primary care physician for things to follow:  Follow up with PMD in [facility]   Admission Diagnosis  SOB (shortness of breath) [R06.02] UTI (lower urinary tract infection) [N39.0] Right flank pain [R10.9]   Discharge Diagnosis  SOB (shortness of breath) [R06.02] UTI (lower urinary tract infection) [N39.0] Right flank pain [R10.9]    Active Problems:   UTI (lower urinary tract infection)      Past Medical History:  Diagnosis Date  . Asthma   . Bladder cancer ([place]) 2015  . Cancer [facility]) 2001   kidney left   . Hypertension   . Renal insufficiency     Past Surgical History:  Procedure Laterality Date  . BACK SURGERY         History of present illness and  Hospital Course:     Kindly see H&P for history of present illness and admission details, please review complete Labs, Consult reports and Test reports for all details in brief  HPI  from the history and physical done on the day of admission  80 yr old female admitted for right flank pain, uti.  Hospital Course  Right flank pain due to  UTI; ct  With renal protocol is negative for stone.urine culture is negative for growth. So we did not give abx prescription.pt did not have pain or fever.  2..constipation;improved with  Laxatives.xray abdomen is negative for obstruction.  3.h/o bladder CA  4.essential HTN;controlled/ 5.h/o copd;no wheezing.continue inhalers,prednisone and on o2 at home   Discharge Condition:stable   Follow UP  [place], MD. Go on 05/08/2016.   Specialty:  Internal  Medicine Why:  @10 :15 AM Contact information: [place] [address] [phone]             Discharge Instructions  and  Discharge Medications       Medication List    TAKE these medications   ADVAIR DISKUS 250-50 MCG/DOSE Aepb Generic drug:  Fluticasone-Salmeterol Inhale 1 puff into the lungs 2 (two) times daily. *Rinse mouth after use*   albuterol (2.5 MG/3ML) 0.083% nebulizer solution Commonly known as:  PROVENTIL Take 3-6 mLs by nebulization every 6 (six) hours as needed. For shortness of breath/wheezing.   aspirin EC 81 MG tablet Take 81 mg by mouth every morning.   bisacodyl 5 MG EC tablet Commonly known as:  DULCOLAX Take 1 tablet (5 mg total) by mouth daily as needed for moderate constipation.   CALCIUM 1200+D3 PO Take 1 tablet by mouth every morning.   COMBIVENT RESPIMAT 20-100 MCG/ACT Aers respimat Generic drug:  Ipratropium-Albuterol Inhale 1 puff into the lungs 4 (four) times daily as needed. For COPD.   dicyclomine 10 MG capsule Commonly known as:  BENTYL Take 10 mg by mouth 2 (two) times daily as needed for muscle spasms. For up to 10 days   losartan 50 MG tablet Commonly known as:  COZAAR Take 50 mg by mouth every morning.   metoprolol succinate 25 MG 24 hr tablet Commonly known as:  TOPROL-XL Take 25 mg by mouth every morning.   multivitamin with minerals Tabs tablet Take 1 tablet by mouth every morning.   NEOMYCIN-POLYMYXIN-HYDROCORTISONE 1 % Soln otic solution Commonly known as:  CORTISPORIN  Apply 1-2 drops to toe BID after soaking   omeprazole 40 MG capsule Commonly known as:  PRILOSEC Take 40 mg by mouth 3 (three) times a week.   polyethylene glycol powder powder Commonly known as:  GLYCOLAX/MIRALAX Take 17 g by mouth daily as needed for constipation. Mix with 4 to 8 ounces of fluid.   predniSONE 20 MG tablet Commonly known as:  DELTASONE Take 2 tablets (40 mg total) by mouth daily  with breakfast.   pregabalin 50 MG capsule Commonly known as:  LYRICA Take 1 capsule (50 mg total) by mouth 3 (three) times daily.   theophylline 300 MG 24 hr capsule Commonly known as:  THEO-24 Take 1 capsule (300 mg total) by mouth every morning.         Diet and Activity recommendation: See Discharge Instructions above   Consults obtained -none   Major procedures and Radiology Reports - PLEASE review detailed and final reports for all details, in brief -     Dg Chest 1 View  Result Date: 04/25/2016 CLINICAL DATA:  Patient with shortness of breath.  Right flank pain. EXAM: CHEST 1 VIEW COMPARISON:  CT chest 10/10/2015; chest radiograph 10/06/2015 FINDINGS: Stable cardiac and mediastinal contours with tortuosity and calcification of the thoracic aorta. No consolidative pulmonary opacities. No pleural effusion or pneumothorax. IMPRESSION: No acute cardiopulmonary process. Aortic vascular calcification. Electronically Signed   By: Lovey Newcomer M.D.   On: 04/25/2016 15:35   Dg Abd 1 View  Result Date: 04/28/2016 CLINICAL DATA:  Constipation and flank pain. History of bladder cancer in 2015. Renal insufficiency. Ex-smoker. EXAM: ABDOMEN - 1 VIEW COMPARISON:  Abdomen pelvis CT dated 04/25/2016. FINDINGS: Normal bowel gas pattern. Prominent stool in the right and transverse colon. Left nephrectomy surgical clips. Bilateral pelvic surgical clips. Bilateral pelvic phleboliths. No calcified urinary tract calculi seen. Right total hip prosthesis. Atheromatous arterial calcifications including the abdominal aorta. Moderate levoconvex lumbar rotary scoliosis and degenerative changes. IMPRESSION: 1. Prominent stool in the right and transverse colon. 2. Aortic atherosclerosis. Electronically Signed   By: Claudie Revering M.D.   On: 04/28/2016 11:00   Ct Renal Stone Study  Result Date: 04/25/2016 CLINICAL DATA:  Right flank pain since Thursday night. Nausea and vomiting yesterday. EXAM: CT ABDOMEN AND  PELVIS WITHOUT CONTRAST TECHNIQUE: Multidetector CT imaging of the abdomen and pelvis was performed following the standard protocol without IV contrast. COMPARISON:  CT abdomen dated 06/18/2009. FINDINGS: Lower chest:  No acute findings. Hepatobiliary: Peripheral contours of the liver are slightly nodular suggesting early or mild cirrhosis. Gallbladder appears normal. Pancreas: No mass or inflammatory process identified on this un-enhanced exam. Spleen: Within normal limits in size. Adrenals/Urinary Tract: Low-density mass within the right adrenal gland measures 1.8 cm, compatible with benign adrenal adenoma by CT density measurements. Left adrenal gland appears normal. Left kidney is surgically absent. Right kidney appears normal without mass, stone or hydronephrosis. No ureteral or bladder calculi identified. Bladder is decompressed. Stomach/Bowel: Bowel is normal in caliber. No evidence of bowel wall inflammation. Scattered diverticulosis within the sigmoid and descending colon but no inflammatory change to suggest acute diverticulitis. Moderate amount of stool and gas throughout the nondistended colon. Stomach is unremarkable. Vascular/Lymphatic: Heavy atherosclerotic changes of the normal caliber abdominal aorta and pelvic vasculature. Reproductive: Presumed hysterectomy. Other: No free fluid or abscess collections seen. No free intraperitoneal air. Musculoskeletal: Right hip arthroplasty hardware in place. Degenerative changes within the slightly scoliotic thoracolumbar spine and at the left hip. No acute or  suspicious osseous finding. IMPRESSION: 1. No acute findings within the abdomen or pelvis. Right kidney appears normal without stone or hydronephrosis. No perinephric inflammation. No ureteral or bladder calculi. Left kidney is surgically absent, without evidence of surgical complicating feature. 2. Colonic diverticulosis without evidence of acute diverticulitis. 3. Probable liver cirrhosis. 4. Moderate  amount of stool and gas throughout the nondistended colon (constipation? ). 5. Aortic atherosclerosis. 6. Degenerative changes throughout the scoliotic thoracolumbar spine, moderate in degree. No evidence of acute osseous abnormality. Electronically Signed   By: Franki Cabot M.D.   On: 04/25/2016 12:33    Micro Results      Recent Results (from the past 240 hour(s))  Urine culture     Status: Abnormal   Collection Time: 04/25/16  9:44 AM  Result Value Ref Range Status   Specimen Description URINE, RANDOM  Final   Special Requests NONE  Final   Culture MULTIPLE SPECIES PRESENT, SUGGEST RECOLLECTION (A)  Final   Report Status 04/26/2016 FINAL  Final  Urine culture     Status: None   Collection Time: 04/27/16 10:00 AM  Result Value Ref Range Status   Specimen Description URINE, RANDOM  Final   Special Requests NONE  Final   Culture NO GROWTH Performed at [facility]   Final   Report Status 04/28/2016 FINAL  Final       Today   Subjective:   Annica Schwisow today has no headache,no chest abdominal pain,no new weakness tingling or numbness, feels much better wants to go home today.   Objective:   Blood pressure (!) 116/56, pulse 77, temperature 98.2 F (36.8 C), resp. rate 16, height 5\' 4"  (1.626 m), weight 74.8 kg (164 lb 12.8 oz), SpO2 (!) 89 %.  No intake or output data in the 24 hours ending 05/02/16 0901  Exam Awake Alert, Oriented x 3, No new F.N deficits, Normal affect [place].AT,PERRAL Supple Neck,No JVD, No cervical lymphadenopathy appriciated.  Symmetrical Chest wall movement, Good air movement bilaterally, CTAB RRR,No Gallops,Rubs or new Murmurs, No Parasternal Heave +ve B.Sounds, Abd Soft, Non tender, No organomegaly appriciated, No rebound -guarding or rigidity. No Cyanosis, Clubbing or edema, No new Rash or bruise  Data Review   CBC w Diff:  Lab Results  Component Value Date   WBC 7.1 04/26/2016   HGB 12.1 04/26/2016   HCT 35.3 04/26/2016   PLT  145 (L) 04/26/2016   LYMPHOPCT 14 10/06/2015   MONOPCT 10 10/06/2015   EOSPCT 8 10/06/2015   BASOPCT 0 10/06/2015    CMP:  Lab Results  Component Value Date   NA 137 04/26/2016   K 4.1 04/26/2016   CL 99 (L) 04/26/2016   CO2 31 04/26/2016   BUN 25 (H) 04/26/2016   CREATININE 1.13 (H) 04/26/2016   PROT 7.5 04/25/2016   ALBUMIN 4.1 04/25/2016   BILITOT 1.3 (H) 04/25/2016   ALKPHOS 59 04/25/2016   AST 20 04/25/2016   ALT 15 04/25/2016  .   Total Time in preparing paper work, data evaluation and todays exam - 78 minutes  Raizel Wesolowski M.D on 8/092017 at 9:01 AM    Note: This dictation was prepared with Dragon dictation along with smaller phrase technology. Any transcriptional errors that result from this process are unintentional.

## 2016-06-02 NOTE — Progress Notes (Signed)
[facility] Consultation      Assessment and Plan:  The patient is a 80 year old female with a history of invasive bladder cancer, emphysema, lung nodule, being followed up for COPD and lung nodule.   Lung Nodule. -CT chest in April 2016 Showed 0.7 cmm lung nodule in superior segment of Rt lower lobe  -Repeat scan at [facility] continues to show nodules, changed from their previous scan on 06/2015. Will repeat CT chest in October of this year, to add on to [facility] abdominal CT scan.    Emphysema.  -Chronic severe emphysema, will repeat PFT and 6MWT before next visit.  -Continue advair, theophylline, combivent, and prn albuterol nebs. She asked about stopping theophylline, I advised her that she could do a trial off the theophylline for a week, as she had discussed with Dr. Ginette Pitman. If she notices that her breathing is worse, go back on it. If she notices no difference off of it, she can stay off of it.    Dyspnea. --She is already maximally treated with inhaled medications and continues to experience continued decline in her breathing.  --Will refer to pulmonary rehab, discussed the importance of it with her.   Chronic hypoxic respiratory failure.  -Nocturnal hypoxia, continue oxygen at 2L qhs.    Bladder cancer. -We will need to follow her lung nodule, given risk for metastatic disease.  Date: 06/02/2016  MRN# [id number] Catherine Landry 07/26/1936  Referring Physician: Dr. Ginette Pitman.   Catherine Landry is a 80 y.o. old female seen in consultation for chief complaint of:    Chief Complaint  Patient presents with  . Follow-up    63mo rov. recent admission on 04/25/16 for UTI & SOB. breathing has worsen has since last ov. pt c/o sob w/exertion, prod cough yellow to green mucus, occ wheezing. on 2L 02 qhs    HPI:  The patient is a 80 year old female with a history of COPD. She is currently on Advair 250/50, theophylline, Combivent Respimat.   She has not smoked in 16  years. She drives a car, she does all her own house work she has no difficulty ambulating a grocery store. She had a recent admission for UTI. She notes that she has been having a lot of congestion and phlegm in her throat. She is taking advair bid and combivent a few times per day. She a neb of albuterol twice per day and they seem to help.  She also takes theophylline and feels that it helps.   She has a history of bladder cancer, chronic kidney disease, GERD, invasive bladder cancer, and left  nephroureterectomy in 2002. For her last note with oncology in October 2016, her restaging CT was negative for recurrent or metastatic disease.  CT chest 01/16/16: at [facility] and compared with previous from 07/11/15 Impression: 1. Several scattered reticular and nodular opacities are new from prior, favored to be infectious/inflammatory in etiology, although at least one nodule is solitary and could conceivably represent a neoplasm in the background setting of emphysema. Recommend 3 month follow-up CT.  CT chest in April 2016 Showed 0.7 cmm lung nodule in superior segment of Rt lower lobe  CT chest images and report from 10/10/2015 and compared with previous from from 10/06/2015. There appeared to be a small right upper lobe nodule, as well as a small area of scarring/pneumonia which appeared to have resolved and the subsequent CT, although the nodule is still present. CT shows significant bilateral emphysema. There is also streaky atelectasis  seen in the left base.   Medication:   Reviewed.     Allergies:  Review of patient's allergies indicates no known allergies.  Review of Systems: Gen:  Denies  fever, sweats, chills HEENT: Denies blurred vision, double vision.  Cvc:  No dizziness, chest pain. Resp:   Denies cough  Gi: Denies swallowing difficulty, stomach pain. Gu:  Denies bladder incontinence, burning urine Ext:   No Joint pain, stiffness. Skin: No skin rash,  hives Endoc:  No polyuria,  polydipsia. Psych: No depression, insomnia. Other:  All other systems were reviewed with the patient and were negative other that what is mentioned in the HPI.   Physical Examination:   VS: BP 136/78 (BP Location: Left Arm, Cuff Size: Normal)   Pulse 95   Ht 5\' 5"  (1.651 m)   Wt 147 lb (66.7 kg)   SpO2 91%   BMI 24.46 kg/m  on room air. General Appearance: No distress  Neuro:without focal findings,  speech normal,  HEENT: PERRLA, EOM intact.   Pulmonary: normal breath sounds,  Decreased air entry bilaterally. No wheezing.  CardiovascularNormal S1,S2.  No m/r/g.   Abdomen: Benign, Soft, non-tender. Renal:  No costovertebral tenderness  GU:  No performed at this time. Endoc: No evident thyromegaly, no signs of acromegaly. Skin:   warm, no rashes, no ecchymosis  Extremities: normal, no cyanosis, clubbing.  Other findings:    LABORATORY PANEL:   CBC No results for input(s): WBC, HGB, HCT, PLT in the last 168 hours. ------------------------------------------------------------------------------------------------------------------  Chemistries  No results for input(s): NA, K, CL, CO2, GLUCOSE, BUN, CREATININE, CALCIUM, MG, AST, ALT, ALKPHOS, BILITOT in the last 168 hours.  Invalid input(s): GFRCGP ------------------------------------------------------------------------------------------------------------------  Cardiac Enzymes No results for input(s): TROPONINI in the last 168 hours. ------------------------------------------------------------  RADIOLOGY:  No results found.     Thank  you for the consultation and for allowing [facility] to assist in the care of your patient. Our recommendations are noted above.  Please contact us if we can be of further service.   Marda Stalker, MD.  Board Certified in Internal Medicine, Pulmonary Medicine, [place], and Sleep Medicine.  [facility]  Patricia Pesa,  M.D.  Vilinda Boehringer, M.D.  Merton Border, M.D

## 2016-06-03 ENCOUNTER — Encounter: Payer: Self-pay | Admitting: Internal Medicine

## 2016-06-03 ENCOUNTER — Ambulatory Visit (INDEPENDENT_AMBULATORY_CARE_PROVIDER_SITE_OTHER): Payer: Medicare Other | Admitting: Internal Medicine

## 2016-06-03 VITALS — BP 136/78 | HR 95 | Ht 65.0 in | Wt 147.0 lb

## 2016-06-03 DIAGNOSIS — J438 Other emphysema: Secondary | ICD-10-CM | POA: Diagnosis not present

## 2016-06-03 NOTE — Patient Instructions (Addendum)
--  Refer to pulmonary rehab.   --folow up 6 months, and PFT with 6 minute walk before next visit.   -Call us if you get bronchitis and we will call you in a prescription for prednisone taper.   --Repeat CT chest without contrast in October, add on to CT abdomen/pelvis being done at [facility].

## 2016-06-05 ENCOUNTER — Other Ambulatory Visit: Payer: Self-pay

## 2016-06-05 DIAGNOSIS — J441 Chronic obstructive pulmonary disease with (acute) exacerbation: Secondary | ICD-10-CM

## 2016-06-05 DIAGNOSIS — R0600 Dyspnea, unspecified: Secondary | ICD-10-CM

## 2016-06-24 ENCOUNTER — Telehealth: Payer: Self-pay | Admitting: Internal Medicine

## 2016-06-24 NOTE — Telephone Encounter (Signed)
Spoke with Catherine Landry and she states she will let Laureen know that pt is not due for PFT until Feb 2018 per DR and for her to call back if pt needs it before then and I will get her scheduled. Nothing further needed at this time.

## 2016-06-24 NOTE — Telephone Encounter (Signed)
[facility] called, would like a copy of pt PFT. Please call or fax [phone]

## 2016-06-26 ENCOUNTER — Telehealth: Payer: Self-pay | Admitting: Internal Medicine

## 2016-06-26 MED ORDER — LEVOFLOXACIN 500 MG PO TABS: 500.0000 mg | ORAL_TABLET | Freq: Every day | ORAL | 0 refills | Status: DC

## 2016-06-26 MED ORDER — PREDNISONE 10 MG (21) PO TBPK: ORAL_TABLET | ORAL | 0 refills | Status: DC

## 2016-06-26 NOTE — Telephone Encounter (Signed)
Pt informed. Nothing further needed. 

## 2016-06-26 NOTE — Telephone Encounter (Signed)
Pt states she is very congested. States she feels like everything is sitting her throat. States more SOB and more fatigued. Using O2 more. Prod cough green mucus. Pt states she is having a colonoscopy Tuesday morning. She is asking if we think she should still have it and if we can call her something in to the pharmacy. Last seen 06/03/16. Using her Advair, Combivent and Albuterol. Please advise.

## 2016-06-26 NOTE — Telephone Encounter (Signed)
LMOVM for pt to call back. Have sent prescriptions to pharmacy. Will await call back.

## 2016-06-26 NOTE — Telephone Encounter (Signed)
Pt calling stating she is very congested and has had to use her oxygen the past two days more Would like to see if we can call something in [place] [address]

## 2016-06-26 NOTE — Telephone Encounter (Signed)
Prednisone 10 mg tabs x 21. Take 6 tabs first day, then 5-4-3-2-1.   levaquin 500 mg for 5 days.   Colonoscopy should be postponed until you are feeling completely back to normal.

## 2016-06-30 ENCOUNTER — Encounter: Payer: Self-pay | Admitting: Internal Medicine

## 2016-06-30 DIAGNOSIS — R0600 Dyspnea, unspecified: Secondary | ICD-10-CM

## 2016-06-30 DIAGNOSIS — J441 Chronic obstructive pulmonary disease with (acute) exacerbation: Secondary | ICD-10-CM

## 2016-07-13 ENCOUNTER — Encounter: Payer: Self-pay | Admitting: Internal Medicine

## 2016-07-13 DIAGNOSIS — J449 Chronic obstructive pulmonary disease, unspecified: Secondary | ICD-10-CM

## 2016-07-14 ENCOUNTER — Encounter: Payer: Medicare Other | Attending: Internal Medicine | Admitting: Respiratory Therapy

## 2016-07-14 VITALS — Ht 62.5 in | Wt 147.8 lb

## 2016-07-14 DIAGNOSIS — J438 Other emphysema: Secondary | ICD-10-CM | POA: Insufficient documentation

## 2016-07-14 DIAGNOSIS — J439 Emphysema, unspecified: Secondary | ICD-10-CM

## 2016-07-14 DIAGNOSIS — J449 Chronic obstructive pulmonary disease, unspecified: Secondary | ICD-10-CM | POA: Insufficient documentation

## 2016-07-14 NOTE — Progress Notes (Signed)
Pulmonary Individual Treatment Plan  Patient Details  Name: Catherine Landry MRN: [id number] Date of Birth: [birth year] Referring Provider:   Flowsheet Row Pulmonary Rehab from 07/14/2016 in [facility] Cardiac and Pulmonary Rehab  Referring Provider  Ramachandran      Initial Encounter Date:  Flowsheet Row Pulmonary Rehab from 07/14/2016 in [facility] Cardiac and Pulmonary Rehab  Date  07/14/16  Referring Provider  Ashby Dawes      Visit Diagnosis: Pulmonary emphysema, unspecified emphysema type ([place])  Patient's Home Medications on Admission:  Current Outpatient Prescriptions:    ADVAIR DISKUS 250-50 MCG/DOSE AEPB, Inhale 1 puff into the lungs 2 (two) times daily. *Rinse mouth after use*, Disp: , Rfl: 1   albuterol (PROVENTIL) (2.5 MG/3ML) 0.083% nebulizer solution, Take 3-6 mLs by nebulization every 6 (six) hours as needed. For shortness of breath/wheezing., Disp: , Rfl: 5   aspirin EC 81 MG tablet, Take 81 mg by mouth every morning., Disp: , Rfl:    bisacodyl (DULCOLAX) 5 MG EC tablet, Take 1 tablet (5 mg total) by mouth daily as needed for moderate constipation., Disp: 30 tablet, Rfl: 0   Calcium-Magnesium-Vitamin D (CALCIUM 1200+D3 PO), Take 1 tablet by mouth every morning., Disp: , Rfl:    COMBIVENT RESPIMAT 20-100 MCG/ACT AERS respimat, Inhale 1 puff into the lungs 4 (four) times daily as needed. For COPD., Disp: , Rfl: 5   dicyclomine (BENTYL) 10 MG capsule, Take 10 mg by mouth 2 (two) times daily as needed for muscle spasms. For up to 10 days, Disp: , Rfl:    levofloxacin (LEVAQUIN) 500 MG tablet, Take 1 tablet (500 mg total) by mouth daily., Disp: 5 tablet, Rfl: 0   losartan (COZAAR) 50 MG tablet, Take 50 mg by mouth every morning., Disp: , Rfl:    metoprolol succinate (TOPROL-XL) 25 MG 24 hr tablet, Take 25 mg by mouth every morning., Disp: , Rfl:    Multiple Vitamin (MULTIVITAMIN WITH MINERALS) TABS, Take 1 tablet by mouth every morning., Disp: , Rfl:     NEOMYCIN-POLYMYXIN-HYDROCORTISONE (CORTISPORIN) 1 % SOLN otic solution, Apply 1-2 drops to toe BID after soaking, Disp: 10 mL, Rfl: 1   omeprazole (PRILOSEC) 40 MG capsule, Take 40 mg by mouth 3 (three) times a week. , Disp: , Rfl: 5   polyethylene glycol powder (GLYCOLAX/MIRALAX) powder, Take 17 g by mouth daily as needed for constipation. Mix with 4 to 8 ounces of fluid., Disp: , Rfl:    predniSONE (STERAPRED UNI-PAK 21 TAB) 10 MG (21) TBPK tablet, Take 6 tablets on Day 1, then 5-4-3-2-1 until complete, Disp: 21 tablet, Rfl: 0   theophylline (THEO-24) 300 MG 24 hr capsule, Take 1 capsule (300 mg total) by mouth every morning., Disp: 30 capsule, Rfl: 0  Past Medical History: Past Medical History:  Diagnosis Date   Asthma    Bladder cancer ([place]) 2015   Cancer ([place]) 2001   kidney left    Hypertension    Renal insufficiency     Tobacco Use: History  Smoking Status   Former Smoker   Packs/day: 1.50   Quit date: 12/09/1999  Smokeless Tobacco   Never Used    Labs: Recent Review Flowsheet Data    There is no flowsheet data to display.       ADL UCSD:     Pulmonary Assessment Scores    Row Name 07/14/16 1253         ADL UCSD   ADL Phase Entry     SOB Score total 68  Rest 0     Walk 2     Stairs 4     Bath 3     Dress 2     Shop 5        Pulmonary Function Assessment:     Pulmonary Function Assessment - 07/14/16 1250      Initial Spirometry Results   FVC% 28 %   FEV1% 24 %   FEV1/FVC Ratio 65.06   Comments Test date 07/14/16     Post Bronchodilator Spirometry Results   FVC% 43 %   FEV1% 28 %   FEV1/FVC Ratio 58.31     Breath   Bilateral Breath Sounds Clear;Decreased   Shortness of Breath Yes;Limiting activity;Fear of Shortness of Breath      Exercise Target Goals: Date: 07/14/16  Exercise Program Goal: Individual exercise prescription set with THRR, safety & activity barriers. Participant demonstrates ability to understand and  report RPE using BORG scale, to self-measure pulse accurately, and to acknowledge the importance of the exercise prescription.  Exercise Prescription Goal: Starting with aerobic activity 30 plus minutes a day, 3 days per week for initial exercise prescription. Provide home exercise prescription and guidelines that participant acknowledges understanding prior to discharge.  Activity Barriers & Risk Stratification:     Activity Barriers & Cardiac Risk Stratification - 07/14/16 1249      Activity Barriers & Cardiac Risk Stratification   Activity Barriers Arthritis;Back Problems;Shortness of Breath;Deconditioning;Right Hip Replacement   Cardiac Risk Stratification Moderate      6 Minute Walk:     6 Minute Walk    Row Name 07/14/16 1300         6 Minute Walk   Distance 712 feet     Walk Time 4.75 minutes     # of Rest Breaks 3     MPH 1.7     METS 1.43     RPE 15     Perceived Dyspnea  4     VO2 Peak 5.02     Symptoms Yes (comment)     Comments Short of breath     Resting HR 78 bpm     Resting BP 134/72     Max Ex. HR 102 bpm     Max Ex. BP 134/60       Interval HR   Baseline HR 78     1 Minute HR 98     2 Minute HR 102     3 Minute HR 98     4 Minute HR 102     5 Minute HR 96     6 Minute HR 101     2 Minute Post HR 87     Interval Heart Rate? Yes       Interval Oxygen   Interval Oxygen? Yes     Baseline Oxygen Saturation % 95 %     Baseline Liters of Oxygen 3 L     1 Minute Oxygen Saturation % 85 %     1 Minute Liters of Oxygen 3 L     2 Minute Oxygen Saturation % 86 %     2 Minute Liters of Oxygen 3 L     3 Minute Oxygen Saturation % 86 %     3 Minute Liters of Oxygen 3 L     4 Minute Oxygen Saturation % 85 %     4 Minute Liters of Oxygen 3 L     5 Minute Oxygen Saturation % 86 %  5 Minute Liters of Oxygen 3 L     6 Minute Oxygen Saturation % 83 %     6 Minute Liters of Oxygen 3 L     2 Minute Post Oxygen Saturation % 94 %     2 Minute Post Liters  of Oxygen 3 L        Initial Exercise Prescription:     Initial Exercise Prescription - 07/14/16 1300      Date of Initial Exercise RX and Referring Provider   Date 07/14/16   Referring Provider Ashby Dawes     Oxygen   Oxygen Continuous   Liters 3     Treadmill   MPH 1.5   Grade 0   Minutes 15  3/3/3/3   METs 2.15     REL-XR   Level 2   Minutes 15   METs 2     T5 Nustep   Level 1   Minutes 15   METs 2     Prescription Details   Frequency (times per week) 3   Duration Progress to 45 minutes of aerobic exercise without signs/symptoms of physical distress     Intensity   THRR 40-80% of Max Heartrate 104-129   Ratings of Perceived Exertion 11-13   Perceived Dyspnea 0-4     Progression   Progression Continue to progress workloads to maintain intensity without signs/symptoms of physical distress.     Resistance Training   Training Prescription Yes   Weight 2   Reps 10-12      Perform Capillary Blood Glucose checks as needed.  Exercise Prescription Changes:   Exercise Comments:     Exercise Comments    Row Name 07/14/16 1312           Exercise Comments Ms Gambrell began the test on room air but droppped quickly too low to continue the test.  She completed the test on 3L continuous oxygen.          Discharge Exercise Prescription (Final Exercise Prescription Changes):    Nutrition:  Target Goals: Understanding of nutrition guidelines, daily intake of sodium 1500mg , cholesterol 200mg , calories 30% from fat and 7% or less from saturated fats, daily to have 5 or more servings of fruits and vegetables.  Biometrics:     Pre Biometrics - 07/14/16 1257      Pre Biometrics   Height 5' 2.5" (1.588 m)   Weight 147 lb 12.8 oz (67 kg)   Waist Circumference 33.5 inches   Hip Circumference 42.5 inches   Waist to Hip Ratio 0.79 %   BMI (Calculated) 26.7       Nutrition Therapy Plan and Nutrition Goals:   Nutrition Discharge: Rate Your Plate  Scores:   Psychosocial: Target Goals: Acknowledge presence or absence of depression, maximize coping skills, provide positive support system. Participant is able to verbalize types and ability to use techniques and skills needed for reducing stress and depression.  Initial Review & Psychosocial Screening:     Initial Psych Review & Screening - 07/14/16 [place]? Yes   Comments Ms Cartagena has good support from he husband of 58yrs. She has faced cancer twice and now COPD, but she has a very positive outlook on life. One concern is her husband's early stages of Alzheimers and his care. Ms Ebarb is looking forward to [place].     Barriers   Psychosocial barriers to participate in program The patient should benefit  from training in stress management and relaxation.     Screening Interventions   Interventions Encouraged to exercise;Program counselor consult      Quality of Life Scores:     Quality of Life - 07/14/16 1307      Quality of Life Scores   Health/Function Pre 21 %   Socioeconomic Pre 21 %   Psych/Spiritual Pre 21 %   Family Pre 21 %   GLOBAL Pre 21 %      PHQ-9: Recent Review Flowsheet Data    Depression screen [facility] 2/9 07/14/2016   Decreased Interest 0   Down, Depressed, Hopeless 0   PHQ - 2 Score 0   Altered sleeping 0   Tired, decreased energy 1   Change in appetite 1   Feeling bad or failure about yourself  0   Trouble concentrating 0   Moving slowly or fidgety/restless 0   Suicidal thoughts 0   PHQ-9 Score 2   Difficult doing work/chores Not difficult at all      Psychosocial Evaluation and Intervention:   Psychosocial Re-Evaluation:  Education: Education Goals: Education classes will be provided on a weekly basis, covering required topics. Participant will state understanding/return demonstration of topics presented.  Learning Barriers/Preferences:     Learning Barriers/Preferences - 07/14/16 1250       Learning Barriers/Preferences   Learning Barriers None   Learning Preferences None      Education Topics: Initial Evaluation Education: - Verbal, written and demonstration of respiratory meds, RPE/PD scales, oximetry and breathing techniques. Instruction on use of nebulizers and MDIs: cleaning and proper use, rinsing mouth with steroid doses and importance of monitoring MDI activations. Flowsheet Row Pulmonary Rehab from 07/14/2016 in [facility] Cardiac and Pulmonary Rehab  Date  07/14/16  Educator  LB  Instruction Review Code  2- meets goals/outcomes      General Nutrition Guidelines/Fats and Fiber: -Group instruction provided by verbal, written material, models and posters to present the general guidelines for heart healthy nutrition. Gives an explanation and review of dietary fats and fiber.   Controlling Sodium/Reading Food Labels: -Group verbal and written material supporting the discussion of sodium use in heart healthy nutrition. Review and explanation with models, verbal and written materials for utilization of the food label.   Exercise Physiology & Risk Factors: - Group verbal and written instruction with models to review the exercise physiology of the cardiovascular system and associated critical values. Details cardiovascular disease risk factors and the goals associated with each risk factor.   Aerobic Exercise & Resistance Training: - Gives group verbal and written discussion on the health impact of inactivity. On the components of aerobic and resistive training programs and the benefits of this training and how to safely progress through these programs.   Flexibility, Balance, General Exercise Guidelines: - Provides group verbal and written instruction on the benefits of flexibility and balance training programs. Provides general exercise guidelines with specific guidelines to those with heart or lung disease. Demonstration and skill practice provided.   Stress  Management: - Provides group verbal and written instruction about the health risks of elevated stress, cause of high stress, and healthy ways to reduce stress.   Depression: - Provides group verbal and written instruction on the correlation between heart/lung disease and depressed mood, treatment options, and the stigmas associated with seeking treatment.   Exercise & Equipment Safety: - Individual verbal instruction and demonstration of equipment use and safety with use of the equipment.   Infection Prevention: - Provides  verbal and written material to individual with discussion of infection control including proper hand washing and proper equipment cleaning during exercise session. Flowsheet Row Pulmonary Rehab from 07/14/2016 in [facility] Cardiac and Pulmonary Rehab  Date  07/14/16  Educator  LB  Instruction Review Code  2- meets goals/outcomes      Falls Prevention: - Provides verbal and written material to individual with discussion of falls prevention and safety. Flowsheet Row Pulmonary Rehab from 07/14/2016 in [facility] Cardiac and Pulmonary Rehab  Date  07/14/16  Educator  LB  Instruction Review Code  2- meets goals/outcomes      Diabetes: - Individual verbal and written instruction to review signs/symptoms of diabetes, desired ranges of glucose level fasting, after meals and with exercise. Advice that pre and post exercise glucose checks will be done for 3 sessions at entry of program.   Chronic Lung Diseases: - Group verbal and written instruction to review new updates, new respiratory medications, new advancements in procedures and treatments. Provide informative websites and "800" numbers of self-education.   Lung Procedures: - Group verbal and written instruction to describe testing methods done to diagnose lung disease. Review the outcome of test results. Describe the treatment choices: Pulmonary Function Tests, ABGs and oximetry.   Energy Conservation: - Provide group  verbal and written instruction for methods to conserve energy, plan and organize activities. Instruct on pacing techniques, use of adaptive equipment and posture/positioning to relieve shortness of breath.   Triggers: - Group verbal and written instruction to review types of environmental controls: home humidity, furnaces, filters, dust mite/pet prevention, HEPA vacuums. To discuss weather changes, air quality and the benefits of nasal washing.   Exacerbations: - Group verbal and written instruction to provide: warning signs, infection symptoms, calling MD promptly, preventive modes, and value of vaccinations. Review: effective airway clearance, coughing and/or vibration techniques. Create an Sports administrator.   Oxygen: - Individual and group verbal and written instruction on oxygen therapy. Includes supplement oxygen, available portable oxygen systems, continuous and intermittent flow rates, oxygen safety, concentrators, and Medicare reimbursement for oxygen. Flowsheet Row Pulmonary Rehab from 07/14/2016 in [facility] Cardiac and Pulmonary Rehab  Date  07/14/16  Educator  LB  Instruction Review Code  2- meets goals/outcomes      Respiratory Medications: - Group verbal and written instruction to review medications for lung disease. Drug class, frequency, complications, importance of spacers, rinsing mouth after steroid MDI's, and proper cleaning methods for nebulizers. Flowsheet Row Pulmonary Rehab from 07/14/2016 in [facility] Cardiac and Pulmonary Rehab  Date  07/14/16  Educator  LB  Instruction Review Code  2- meets goals/outcomes      AED/CPR: - Group verbal and written instruction with the use of models to demonstrate the basic use of the AED with the basic ABC's of resuscitation.   Breathing Retraining: - Provides individuals verbal and written instruction on purpose, frequency, and proper technique of diaphragmatic breathing and pursed-lipped breathing. Applies individual practice  skills. Flowsheet Row Pulmonary Rehab from 07/14/2016 in [facility] Cardiac and Pulmonary Rehab  Date  07/14/16  Educator  LB  Instruction Review Code  2- meets goals/outcomes      Anatomy and Physiology of the Lungs: - Group verbal and written instruction with the use of models to provide basic lung anatomy and physiology related to function, structure and complications of lung disease.   Heart Failure: - Group verbal and written instruction on the basics of heart failure: signs/symptoms, treatments, explanation of ejection fraction, enlarged heart and cardiomyopathy.  Sleep Apnea: - Individual verbal and written instruction to review Obstructive Sleep Apnea. Review of risk factors, methods for diagnosing and types of masks and machines for OSA.   Anxiety: - Provides group, verbal and written instruction on the correlation between heart/lung disease and anxiety, treatment options, and management of anxiety.   Relaxation: - Provides group, verbal and written instruction about the benefits of relaxation for patients with heart/lung disease. Also provides patients with examples of relaxation techniques.   Knowledge Questionnaire Score:     Knowledge Questionnaire Score - 07/14/16 1250      Knowledge Questionnaire Score   Pre Score 8/10       Core Components/Risk Factors/Patient Goals at Admission:     Personal Goals and Risk Factors at Admission - 07/14/16 1257      Core Components/Risk Factors/Patient Goals on Admission   Sedentary Yes  Likes to walk and camp   Intervention Provide advice, education, support and counseling about physical activity/exercise needs.;Develop an individualized exercise prescription for aerobic and resistive training based on initial evaluation findings, risk stratification, comorbidities and participant's personal goals.   Expected Outcomes Achievement of increased cardiorespiratory fitness and enhanced flexibility, muscular endurance and strength  shown through measurements of functional capacity and personal statement of participant.   Increase Strength and Stamina Yes   Intervention Provide advice, education, support and counseling about physical activity/exercise needs.;Develop an individualized exercise prescription for aerobic and resistive training based on initial evaluation findings, risk stratification, comorbidities and participant's personal goals.   Expected Outcomes Achievement of increased cardiorespiratory fitness and enhanced flexibility, muscular endurance and strength shown through measurements of functional capacity and personal statement of participant.   Improve shortness of breath with ADL's Yes   Intervention Provide education, individualized exercise plan and daily activity instruction to help decrease symptoms of SOB with activities of daily living.   Expected Outcomes Short Term: Achieves a reduction of symptoms when performing activities of daily living.   Develop more efficient breathing techniques such as purse lipped breathing and diaphragmatic breathing; and practicing self-pacing with activity Yes   Intervention Provide education, demonstration and support about specific breathing techniuqes utilized for more efficient breathing. Include techniques such as pursed lipped breathing, diaphragmatic breathing and self-pacing activity.   Expected Outcomes Short Term: Participant will be able to demonstrate and use breathing techniques as needed throughout daily activities.   Increase knowledge of respiratory medications and ability to use respiratory devices properly  Yes  Advair, Combivent Respimat, SVN Albuterol; oxygen 2l/m at night and as needed   Intervention Provide education and demonstration as needed of appropriate use of medications, inhalers, and oxygen therapy.   Expected Outcomes Short Term: Achieves understanding of medications use. Understands that oxygen is a medication prescribed by physician.  Demonstrates appropriate use of inhaler and oxygen therapy.   Hypertension Yes   Intervention Provide education on lifestyle modifcations including regular physical activity/exercise, weight management, moderate sodium restriction and increased consumption of fresh fruit, vegetables, and low fat dairy, alcohol moderation, and smoking cessation.;Monitor prescription use compliance.   Expected Outcomes Short Term: Continued assessment and intervention until BP is < 140/67mm HG in hypertensive participants. < 130/41mm HG in hypertensive participants with diabetes, heart failure or chronic kidney disease.;Long Term: Maintenance of blood pressure at goal levels.      Core Components/Risk Factors/Patient Goals Review:    Core Components/Risk Factors/Patient Goals at Discharge (Final Review):    ITP Comments:   Comments: Ms Jakubiak plans to start [place] on 07/22/16 and  attend 2-3 days/week.

## 2016-07-14 NOTE — Patient Instructions (Signed)
Patient Instructions  Patient Details  Name: Catherine Landry MRN: [id number] Date of Birth: 12/30/1935 Referring Provider:  Laverle Hobby, *  Below are the personal goals you chose as well as exercise and nutrition goals. Our goal is to help you keep on track towards obtaining and maintaining your goals. We will be discussing your progress on these goals with you throughout the program.  Initial Exercise Prescription:     Initial Exercise Prescription - 07/14/16 1300      Date of Initial Exercise RX and Referring Provider   Date 07/14/16   Referring Provider Ashby Dawes     Oxygen   Oxygen Continuous   Liters 3     Treadmill   MPH 1.5   Grade 0   Minutes 15  3/3/3/3   METs 2.15     REL-XR   Level 2   Minutes 15   METs 2     T5 Nustep   Level 1   Minutes 15   METs 2     Prescription Details   Frequency (times per week) 3   Duration Progress to 45 minutes of aerobic exercise without signs/symptoms of physical distress     Intensity   THRR 40-80% of Max Heartrate 104-129   Ratings of Perceived Exertion 11-13   Perceived Dyspnea 0-4     Progression   Progression Continue to progress workloads to maintain intensity without signs/symptoms of physical distress.     Resistance Training   Training Prescription Yes   Weight 2   Reps 10-12      Exercise Goals: Frequency: Be able to perform aerobic exercise three times per week working toward 3-5 days per week.  Intensity: Work with a perceived exertion of 11 (fairly light) - 15 (hard) as tolerated. Follow your new exercise prescription and watch for changes in prescription as you progress with the program. Changes will be reviewed with you when they are made.  Duration: You should be able to do 30 minutes of continuous aerobic exercise in addition to a 5 minute warm-up and a 5 minute cool-down routine.  Nutrition Goals: Your personal nutrition goals will be established when you do your nutrition  analysis with the dietician.  The following are nutrition guidelines to follow: Cholesterol < 200mg /day Sodium < 1500mg /day Fiber: Women over 50 yrs - 21 grams per day  Personal Goals:     Personal Goals and Risk Factors at Admission - 07/14/16 1257      Core Components/Risk Factors/Patient Goals on Admission   Sedentary Yes  Likes to walk and camp   Intervention Provide advice, education, support and counseling about physical activity/exercise needs.;Develop an individualized exercise prescription for aerobic and resistive training based on initial evaluation findings, risk stratification, comorbidities and participant's personal goals.   Expected Outcomes Achievement of increased cardiorespiratory fitness and enhanced flexibility, muscular endurance and strength shown through measurements of functional capacity and personal statement of participant.   Increase Strength and Stamina Yes   Intervention Provide advice, education, support and counseling about physical activity/exercise needs.;Develop an individualized exercise prescription for aerobic and resistive training based on initial evaluation findings, risk stratification, comorbidities and participant's personal goals.   Expected Outcomes Achievement of increased cardiorespiratory fitness and enhanced flexibility, muscular endurance and strength shown through measurements of functional capacity and personal statement of participant.   Improve shortness of breath with ADL's Yes   Intervention Provide education, individualized exercise plan and daily activity instruction to help decrease symptoms of SOB with activities of  daily living.   Expected Outcomes Short Term: Achieves a reduction of symptoms when performing activities of daily living.   Develop more efficient breathing techniques such as purse lipped breathing and diaphragmatic breathing; and practicing self-pacing with activity Yes   Intervention Provide education, demonstration  and support about specific breathing techniuqes utilized for more efficient breathing. Include techniques such as pursed lipped breathing, diaphragmatic breathing and self-pacing activity.   Expected Outcomes Short Term: Participant will be able to demonstrate and use breathing techniques as needed throughout daily activities.   Increase knowledge of respiratory medications and ability to use respiratory devices properly  Yes  Advair, Combivent Respimat, SVN Albuterol; oxygen 2l/m at night and as needed   Intervention Provide education and demonstration as needed of appropriate use of medications, inhalers, and oxygen therapy.   Expected Outcomes Short Term: Achieves understanding of medications use. Understands that oxygen is a medication prescribed by physician. Demonstrates appropriate use of inhaler and oxygen therapy.   Hypertension Yes   Intervention Provide education on lifestyle modifcations including regular physical activity/exercise, weight management, moderate sodium restriction and increased consumption of fresh fruit, vegetables, and low fat dairy, alcohol moderation, and smoking cessation.;Monitor prescription use compliance.   Expected Outcomes Short Term: Continued assessment and intervention until BP is < 140/69mm HG in hypertensive participants. < 130/23mm HG in hypertensive participants with diabetes, heart failure or chronic kidney disease.;Long Term: Maintenance of blood pressure at goal levels.      Tobacco Use Initial Evaluation: History  Smoking Status   Former Smoker   Packs/day: 1.50   Quit date: 12/09/1999  Smokeless Tobacco   Never Used    Copy of goals given to participant.

## 2016-07-16 ENCOUNTER — Encounter: Payer: Self-pay | Admitting: Internal Medicine

## 2016-07-20 ENCOUNTER — Encounter: Payer: Self-pay | Admitting: *Deleted

## 2016-07-20 DIAGNOSIS — J439 Emphysema, unspecified: Secondary | ICD-10-CM

## 2016-07-20 NOTE — Progress Notes (Signed)
Pulmonary Individual Treatment Plan  Patient Details  Name: Catherine Landry MRN: [id number] Date of Birth: Jun 02, 1936 Referring Provider:   Flowsheet Row Pulmonary Rehab from 07/14/2016 in [facility] Cardiac and Pulmonary Rehab  Referring Provider  Ramachandran      Initial Encounter Date:  Flowsheet Row Pulmonary Rehab from 07/14/2016 in [facility] Cardiac and Pulmonary Rehab  Date  07/14/16  Referring Provider  Ashby Dawes      Visit Diagnosis: Pulmonary emphysema, unspecified emphysema type ([place])  Patient's Home Medications on Admission:  Current Outpatient Prescriptions:  .  ADVAIR DISKUS 250-50 MCG/DOSE AEPB, Inhale 1 puff into the lungs 2 (two) times daily. *Rinse mouth after use*, Disp: , Rfl: 1 .  albuterol (PROVENTIL) (2.5 MG/3ML) 0.083% nebulizer solution, Take 3-6 mLs by nebulization every 6 (six) hours as needed. For shortness of breath/wheezing., Disp: , Rfl: 5 .  aspirin EC 81 MG tablet, Take 81 mg by mouth every morning., Disp: , Rfl:  .  bisacodyl (DULCOLAX) 5 MG EC tablet, Take 1 tablet (5 mg total) by mouth daily as needed for moderate constipation., Disp: 30 tablet, Rfl: 0 .  Calcium-Magnesium-Vitamin D (CALCIUM 1200+D3 PO), Take 1 tablet by mouth every morning., Disp: , Rfl:  .  COMBIVENT RESPIMAT 20-100 MCG/ACT AERS respimat, Inhale 1 puff into the lungs 4 (four) times daily as needed. For COPD., Disp: , Rfl: 5 .  dicyclomine (BENTYL) 10 MG capsule, Take 10 mg by mouth 2 (two) times daily as needed for muscle spasms. For up to 10 days, Disp: , Rfl:  .  levofloxacin (LEVAQUIN) 500 MG tablet, Take 1 tablet (500 mg total) by mouth daily., Disp: 5 tablet, Rfl: 0 .  losartan (COZAAR) 50 MG tablet, Take 50 mg by mouth every morning., Disp: , Rfl:  .  metoprolol succinate (TOPROL-XL) 25 MG 24 hr tablet, Take 25 mg by mouth every morning., Disp: , Rfl:  .  Multiple Vitamin (MULTIVITAMIN WITH MINERALS) TABS, Take 1 tablet by mouth every morning., Disp: , Rfl:  .   NEOMYCIN-POLYMYXIN-HYDROCORTISONE (CORTISPORIN) 1 % SOLN otic solution, Apply 1-2 drops to toe BID after soaking, Disp: 10 mL, Rfl: 1 .  omeprazole (PRILOSEC) 40 MG capsule, Take 40 mg by mouth 3 (three) times a week. , Disp: , Rfl: 5 .  polyethylene glycol powder (GLYCOLAX/MIRALAX) powder, Take 17 g by mouth daily as needed for constipation. Mix with 4 to 8 ounces of fluid., Disp: , Rfl:  .  predniSONE (STERAPRED UNI-PAK 21 TAB) 10 MG (21) TBPK tablet, Take 6 tablets on Day 1, then 5-4-3-2-1 until complete, Disp: 21 tablet, Rfl: 0 .  theophylline (THEO-24) 300 MG 24 hr capsule, Take 1 capsule (300 mg total) by mouth every morning., Disp: 30 capsule, Rfl: 0  Past Medical History: Past Medical History:  Diagnosis Date  . Asthma   . Bladder cancer ([place]) 2015  . Cancer [facility]) 2001   kidney left   . Hypertension   . Renal insufficiency     Tobacco Use: History  Smoking Status  . Former Smoker  . Packs/day: 1.50  . Quit date: 12/09/1999  Smokeless Tobacco  . Never Used    Labs: Recent Review Flowsheet Data    There is no flowsheet data to display.       ADL UCSD:     Pulmonary Assessment Scores    Row Name 07/14/16 1253         ADL UCSD   ADL Phase Entry     SOB Score total 68  Rest 0     Walk 2     Stairs 4     Bath 3     Dress 2     Shop 5        Pulmonary Function Assessment:     Pulmonary Function Assessment - 07/14/16 1250      Initial Spirometry Results   FVC% 28 %   FEV1% 24 %   FEV1/FVC Ratio 65.06   Comments Test date 07/14/16     Post Bronchodilator Spirometry Results   FVC% 43 %   FEV1% 28 %   FEV1/FVC Ratio 58.31     Breath   Bilateral Breath Sounds Clear;Decreased   Shortness of Breath Yes;Limiting activity;Fear of Shortness of Breath      Exercise Target Goals:    Exercise Program Goal: Individual exercise prescription set with THRR, safety & activity barriers. Participant demonstrates ability to understand and report RPE using  BORG scale, to self-measure pulse accurately, and to acknowledge the importance of the exercise prescription.  Exercise Prescription Goal: Starting with aerobic activity 30 plus minutes a day, 3 days per week for initial exercise prescription. Provide home exercise prescription and guidelines that participant acknowledges understanding prior to discharge.  Activity Barriers & Risk Stratification:     Activity Barriers & Cardiac Risk Stratification - 07/14/16 1249      Activity Barriers & Cardiac Risk Stratification   Activity Barriers Arthritis;Back Problems;Shortness of Breath;Deconditioning;Right Hip Replacement   Cardiac Risk Stratification Moderate      6 Minute Walk:     6 Minute Walk    Row Name 07/14/16 1300 07/14/16 1329       6 Minute Walk   Distance 712 feet 712 feet    Walk Time 4.75 minutes 4.75 minutes    # of Rest Breaks 3 3    MPH 1.7 1.7    METS 1.43 2.3    RPE 15 15    Perceived Dyspnea  4 4    VO2 Peak 5.02 5.02    Symptoms Yes (comment) Yes (comment)    Comments Short of breath short of breath    Resting HR 78 bpm 78 bpm    Resting BP 134/72 134/72    Max Ex. HR 102 bpm 102 bpm    Max Ex. BP 134/60 134/60      Interval HR   Baseline HR 78 78    1 Minute HR 98 98    2 Minute HR 102 102    3 Minute HR 98 98    4 Minute HR 102 102    5 Minute HR 96 96    6 Minute HR 101 101    2 Minute Post HR 87 87    Interval Heart Rate? Yes Yes      Interval Oxygen   Interval Oxygen? Yes  -    Baseline Oxygen Saturation % 95 % 95 %    Baseline Liters of Oxygen 3 L 3 L    1 Minute Oxygen Saturation % 85 % 85 %    1 Minute Liters of Oxygen 3 L 3 L    2 Minute Oxygen Saturation % 86 % 86 %    2 Minute Liters of Oxygen 3 L 3 L    3 Minute Oxygen Saturation % 86 % 86 %    3 Minute Liters of Oxygen 3 L 3 L    4 Minute Oxygen Saturation % 85 % 85 %    4  Minute Liters of Oxygen 3 L 3 L    5 Minute Oxygen Saturation % 86 % 86 %    5 Minute Liters of Oxygen 3 L  3 L    6 Minute Oxygen Saturation % 83 % 83 %    6 Minute Liters of Oxygen 3 L 3 L    2 Minute Post Oxygen Saturation % 94 % 94 %    2 Minute Post Liters of Oxygen 3 L 3 L       Initial Exercise Prescription:     Initial Exercise Prescription - 07/14/16 1300      Date of Initial Exercise RX and Referring Provider   Date 07/14/16   Referring Provider Ashby Dawes     Oxygen   Oxygen Continuous   Liters 3     Treadmill   MPH 1.5   Grade 0   Minutes 15  3/3/3/3   METs 2.15     REL-XR   Level 2   Minutes 15   METs 2     T5 Nustep   Level 1   Minutes 15   METs 2     Prescription Details   Frequency (times per week) 3   Duration Progress to 45 minutes of aerobic exercise without signs/symptoms of physical distress     Intensity   THRR 40-80% of Max Heartrate 104-129   Ratings of Perceived Exertion 11-13   Perceived Dyspnea 0-4     Progression   Progression Continue to progress workloads to maintain intensity without signs/symptoms of physical distress.     Resistance Training   Training Prescription Yes   Weight 2   Reps 10-12      Perform Capillary Blood Glucose checks as needed.  Exercise Prescription Changes:   Exercise Comments:     Exercise Comments    Row Name 07/14/16 1312           Exercise Comments Ms Stayton began the test on room air but droppped quickly too low to continue the test.  She completed the test on 3L continuous oxygen.          Discharge Exercise Prescription (Final Exercise Prescription Changes):    Nutrition:  Target Goals: Understanding of nutrition guidelines, daily intake of sodium 1500mg , cholesterol 200mg , calories 30% from fat and 7% or less from saturated fats, daily to have 5 or more servings of fruits and vegetables.  Biometrics:     Pre Biometrics - 07/14/16 1257      Pre Biometrics   Height 5' 2.5" (1.588 m)   Weight 147 lb 12.8 oz (67 kg)   Waist Circumference 33.5 inches   Hip Circumference  42.5 inches   Waist to Hip Ratio 0.79 %   BMI (Calculated) 26.7       Nutrition Therapy Plan and Nutrition Goals:   Nutrition Discharge: Rate Your Plate Scores:   Psychosocial: Target Goals: Acknowledge presence or absence of depression, maximize coping skills, provide positive support system. Participant is able to verbalize types and ability to use techniques and skills needed for reducing stress and depression.  Initial Review & Psychosocial Screening:     Initial Psych Review & Screening - 07/14/16 [place]? Yes   Comments Ms Delaporte has good support from he husband of 71yrs. She has faced cancer twice and now COPD, but she has a very positive outlook on life. One concern is her husband's early stages of  Alzheimers and his care. Ms Lanphier is looking forward to [place].     Barriers   Psychosocial barriers to participate in program The patient should benefit from training in stress management and relaxation.     Screening Interventions   Interventions Encouraged to exercise;Program counselor consult      Quality of Life Scores:     Quality of Life - 07/14/16 1307      Quality of Life Scores   Health/Function Pre 21 %   Socioeconomic Pre 21 %   Psych/Spiritual Pre 21 %   Family Pre 21 %   GLOBAL Pre 21 %      PHQ-9: Recent Review Flowsheet Data    Depression screen [facility] 2/9 07/14/2016   Decreased Interest 0   Down, Depressed, Hopeless 0   PHQ - 2 Score 0   Altered sleeping 0   Tired, decreased energy 1   Change in appetite 1   Feeling bad or failure about yourself  0   Trouble concentrating 0   Moving slowly or fidgety/restless 0   Suicidal thoughts 0   PHQ-9 Score 2   Difficult doing work/chores Not difficult at all      Psychosocial Evaluation and Intervention:   Psychosocial Re-Evaluation:  Education: Education Goals: Education classes will be provided on a weekly basis, covering required topics.  Participant will state understanding/return demonstration of topics presented.  Learning Barriers/Preferences:     Learning Barriers/Preferences - 07/14/16 1250      Learning Barriers/Preferences   Learning Barriers None   Learning Preferences None      Education Topics: Initial Evaluation Education: - Verbal, written and demonstration of respiratory meds, RPE/PD scales, oximetry and breathing techniques. Instruction on use of nebulizers and MDIs: cleaning and proper use, rinsing mouth with steroid doses and importance of monitoring MDI activations. Flowsheet Row Pulmonary Rehab from 07/14/2016 in [facility] Cardiac and Pulmonary Rehab  Date  07/14/16  Educator  LB  Instruction Review Code  2- meets goals/outcomes      General Nutrition Guidelines/Fats and Fiber: -Group instruction provided by verbal, written material, models and posters to present the general guidelines for heart healthy nutrition. Gives an explanation and review of dietary fats and fiber.   Controlling Sodium/Reading Food Labels: -Group verbal and written material supporting the discussion of sodium use in heart healthy nutrition. Review and explanation with models, verbal and written materials for utilization of the food label.   Exercise Physiology & Risk Factors: - Group verbal and written instruction with models to review the exercise physiology of the cardiovascular system and associated critical values. Details cardiovascular disease risk factors and the goals associated with each risk factor.   Aerobic Exercise & Resistance Training: - Gives group verbal and written discussion on the health impact of inactivity. On the components of aerobic and resistive training programs and the benefits of this training and how to safely progress through these programs.   Flexibility, Balance, General Exercise Guidelines: - Provides group verbal and written instruction on the benefits of flexibility and balance training  programs. Provides general exercise guidelines with specific guidelines to those with heart or lung disease. Demonstration and skill practice provided.   Stress Management: - Provides group verbal and written instruction about the health risks of elevated stress, cause of high stress, and healthy ways to reduce stress.   Depression: - Provides group verbal and written instruction on the correlation between heart/lung disease and depressed mood, treatment options, and the stigmas associated with seeking treatment.  Exercise & Equipment Safety: - Individual verbal instruction and demonstration of equipment use and safety with use of the equipment.   Infection Prevention: - Provides verbal and written material to individual with discussion of infection control including proper hand washing and proper equipment cleaning during exercise session. Flowsheet Row Pulmonary Rehab from 07/14/2016 in [facility] Cardiac and Pulmonary Rehab  Date  07/14/16  Educator  LB  Instruction Review Code  2- meets goals/outcomes      Falls Prevention: - Provides verbal and written material to individual with discussion of falls prevention and safety. Flowsheet Row Pulmonary Rehab from 07/14/2016 in [facility] Cardiac and Pulmonary Rehab  Date  07/14/16  Educator  LB  Instruction Review Code  2- meets goals/outcomes      Diabetes: - Individual verbal and written instruction to review signs/symptoms of diabetes, desired ranges of glucose level fasting, after meals and with exercise. Advice that pre and post exercise glucose checks will be done for 3 sessions at entry of program.   Chronic Lung Diseases: - Group verbal and written instruction to review new updates, new respiratory medications, new advancements in procedures and treatments. Provide informative websites and "800" numbers of self-education.   Lung Procedures: - Group verbal and written instruction to describe testing methods done to diagnose lung  disease. Review the outcome of test results. Describe the treatment choices: Pulmonary Function Tests, ABGs and oximetry.   Energy Conservation: - Provide group verbal and written instruction for methods to conserve energy, plan and organize activities. Instruct on pacing techniques, use of adaptive equipment and posture/positioning to relieve shortness of breath.   Triggers: - Group verbal and written instruction to review types of environmental controls: home humidity, furnaces, filters, dust mite/pet prevention, HEPA vacuums. To discuss weather changes, air quality and the benefits of nasal washing.   Exacerbations: - Group verbal and written instruction to provide: warning signs, infection symptoms, calling MD promptly, preventive modes, and value of vaccinations. Review: effective airway clearance, coughing and/or vibration techniques. Create an Sports administrator.   Oxygen: - Individual and group verbal and written instruction on oxygen therapy. Includes supplement oxygen, available portable oxygen systems, continuous and intermittent flow rates, oxygen safety, concentrators, and Medicare reimbursement for oxygen. Flowsheet Row Pulmonary Rehab from 07/14/2016 in [facility] Cardiac and Pulmonary Rehab  Date  07/14/16  Educator  LB  Instruction Review Code  2- meets goals/outcomes      Respiratory Medications: - Group verbal and written instruction to review medications for lung disease. Drug class, frequency, complications, importance of spacers, rinsing mouth after steroid MDI's, and proper cleaning methods for nebulizers. Flowsheet Row Pulmonary Rehab from 07/14/2016 in [facility] Cardiac and Pulmonary Rehab  Date  07/14/16  Educator  LB  Instruction Review Code  2- meets goals/outcomes      AED/CPR: - Group verbal and written instruction with the use of models to demonstrate the basic use of the AED with the basic ABC's of resuscitation.   Breathing Retraining: - Provides individuals  verbal and written instruction on purpose, frequency, and proper technique of diaphragmatic breathing and pursed-lipped breathing. Applies individual practice skills. Flowsheet Row Pulmonary Rehab from 07/14/2016 in [facility] Cardiac and Pulmonary Rehab  Date  07/14/16  Educator  LB  Instruction Review Code  2- meets goals/outcomes      Anatomy and Physiology of the Lungs: - Group verbal and written instruction with the use of models to provide basic lung anatomy and physiology related to function, structure and complications of lung disease.  Heart Failure: - Group verbal and written instruction on the basics of heart failure: signs/symptoms, treatments, explanation of ejection fraction, enlarged heart and cardiomyopathy.   Sleep Apnea: - Individual verbal and written instruction to review Obstructive Sleep Apnea. Review of risk factors, methods for diagnosing and types of masks and machines for OSA.   Anxiety: - Provides group, verbal and written instruction on the correlation between heart/lung disease and anxiety, treatment options, and management of anxiety.   Relaxation: - Provides group, verbal and written instruction about the benefits of relaxation for patients with heart/lung disease. Also provides patients with examples of relaxation techniques.   Knowledge Questionnaire Score:     Knowledge Questionnaire Score - 07/14/16 1250      Knowledge Questionnaire Score   Pre Score 8/10       Core Components/Risk Factors/Patient Goals at Admission:     Personal Goals and Risk Factors at Admission - 07/14/16 1257      Core Components/Risk Factors/Patient Goals on Admission   Sedentary Yes  Likes to walk and camp   Intervention Provide advice, education, support and counseling about physical activity/exercise needs.;Develop an individualized exercise prescription for aerobic and resistive training based on initial evaluation findings, risk stratification, comorbidities and  participant's personal goals.   Expected Outcomes Achievement of increased cardiorespiratory fitness and enhanced flexibility, muscular endurance and strength shown through measurements of functional capacity and personal statement of participant.   Increase Strength and Stamina Yes   Intervention Provide advice, education, support and counseling about physical activity/exercise needs.;Develop an individualized exercise prescription for aerobic and resistive training based on initial evaluation findings, risk stratification, comorbidities and participant's personal goals.   Expected Outcomes Achievement of increased cardiorespiratory fitness and enhanced flexibility, muscular endurance and strength shown through measurements of functional capacity and personal statement of participant.   Improve shortness of breath with ADL's Yes   Intervention Provide education, individualized exercise plan and daily activity instruction to help decrease symptoms of SOB with activities of daily living.   Expected Outcomes Short Term: Achieves a reduction of symptoms when performing activities of daily living.   Develop more efficient breathing techniques such as purse lipped breathing and diaphragmatic breathing; and practicing self-pacing with activity Yes   Intervention Provide education, demonstration and support about specific breathing techniuqes utilized for more efficient breathing. Include techniques such as pursed lipped breathing, diaphragmatic breathing and self-pacing activity.   Expected Outcomes Short Term: Participant will be able to demonstrate and use breathing techniques as needed throughout daily activities.   Increase knowledge of respiratory medications and ability to use respiratory devices properly  Yes  Advair, Combivent Respimat, SVN Albuterol; oxygen 2l/m at night and as needed   Intervention Provide education and demonstration as needed of appropriate use of medications, inhalers, and oxygen  therapy.   Expected Outcomes Short Term: Achieves understanding of medications use. Understands that oxygen is a medication prescribed by physician. Demonstrates appropriate use of inhaler and oxygen therapy.   Hypertension Yes   Intervention Provide education on lifestyle modifcations including regular physical activity/exercise, weight management, moderate sodium restriction and increased consumption of fresh fruit, vegetables, and low fat dairy, alcohol moderation, and smoking cessation.;Monitor prescription use compliance.   Expected Outcomes Short Term: Continued assessment and intervention until BP is < 140/78mm HG in hypertensive participants. < 130/80mm HG in hypertensive participants with diabetes, heart failure or chronic kidney disease.;Long Term: Maintenance of blood pressure at goal levels.      Core Components/Risk Factors/Patient Goals Review:  Core Components/Risk Factors/Patient Goals at Discharge (Final Review):    ITP Comments:   Comments: 30 Day Review

## 2016-07-22 ENCOUNTER — Encounter: Payer: Medicare Other | Attending: Internal Medicine

## 2016-07-22 DIAGNOSIS — J449 Chronic obstructive pulmonary disease, unspecified: Secondary | ICD-10-CM | POA: Insufficient documentation

## 2016-07-22 DIAGNOSIS — J438 Other emphysema: Secondary | ICD-10-CM | POA: Insufficient documentation

## 2016-07-24 ENCOUNTER — Telehealth: Payer: Self-pay | Admitting: *Deleted

## 2016-07-24 DIAGNOSIS — J449 Chronic obstructive pulmonary disease, unspecified: Secondary | ICD-10-CM

## 2016-07-24 NOTE — Telephone Encounter (Signed)
Informed pt of ONO results. She states she already wears O2 @ 2L nightly. Will place a new order for an ONO on 2L O2. Order placed nothing further needed.

## 2016-07-27 ENCOUNTER — Encounter: Payer: Medicare Other | Admitting: *Deleted

## 2016-07-27 DIAGNOSIS — J439 Emphysema, unspecified: Secondary | ICD-10-CM

## 2016-07-27 DIAGNOSIS — J449 Chronic obstructive pulmonary disease, unspecified: Secondary | ICD-10-CM | POA: Diagnosis not present

## 2016-07-27 DIAGNOSIS — J438 Other emphysema: Secondary | ICD-10-CM | POA: Diagnosis present

## 2016-07-27 NOTE — Progress Notes (Signed)
Daily Session Note  Patient Details  Name: Catherine Landry MRN: [id number] Date of Birth: 12/08/1935 Referring Provider:   Flowsheet Row Pulmonary Rehab from 07/14/2016 in [facility] Cardiac and Pulmonary Rehab  Referring Provider  Ramachandran      Encounter Date: 07/27/2016  Check In:     Session Check In - 07/27/16 1206      Check-In   Location [facility]-Cardiac & Pulmonary Rehab   Staff Present Carson Myrtle, BS, RRT, Respiratory Therapist;Carsin Randazzo Amedeo Plenty, BS, ACSM CEP, Exercise Physiologist;Amanda Oletta Darter, BA, ACSM CEP, Exercise Physiologist   Supervising physician immediately available to respond to emergencies [facility] immediately available ER MD   Physician(s) Dr. Jimmye Norman and Dr. Corky Downs   Medication changes reported     No   Fall or balance concerns reported    No   Warm-up and Cool-down Performed on first and last piece of equipment   Resistance Training Performed Yes   VAD Patient? No     Pain Assessment   Currently in Pain? No/denies   Multiple Pain Sites No           Exercise Prescription Changes - 07/27/16 1200      Response to Exercise   Blood Pressure (Admit) 114/60   Blood Pressure (Exercise) 130/80   Blood Pressure (Exit) 130/68   Heart Rate (Admit) 82 bpm   Heart Rate (Exercise) 102 bpm   Heart Rate (Exit) 90 bpm   Oxygen Saturation (Admit) 95 %   Oxygen Saturation (Exercise) 86 %  Sat dropped to 86 at RA on TM. W/ 2L of O2 Sat increased 91.   Oxygen Saturation (Exit) 93 %   Rating of Perceived Exertion (Exercise) 12   Perceived Dyspnea (Exercise) 2     Resistance Training   Training Prescription Yes   Weight 2   Reps 10-12     Oxygen   Oxygen Continuous   Liters 3     Treadmill   MPH 1.5   Grade 0   Minutes 15  3/3/3/3   METs 2.15     REL-XR   Level 2   Minutes 15   METs 2     T5 Nustep   Level 1   Minutes 15   METs 2      Goals Met:  Proper associated with RPD/PD & O2 Sat Exercise tolerated well Personal goals  reviewed Strength training completed today  Goals Unmet:  Not Applicable  Comments: First full day of exercise!  Patient was oriented to gym and equipment including functions, settings, policies, and procedures.  Patient's individual exercise prescription and treatment plan were reviewed.  All starting workloads were established based on the results of the 6 minute walk test done at initial orientation visit.  The plan for exercise progression was also introduced and progression will be customized based on patient's performance and goals.    Dr. Emily Filbert is Medical Director for [place] and [facility] Pulmonary Rehabilitation.

## 2016-07-29 ENCOUNTER — Encounter: Payer: Medicare Other | Admitting: *Deleted

## 2016-07-29 DIAGNOSIS — J438 Other emphysema: Secondary | ICD-10-CM | POA: Diagnosis not present

## 2016-07-29 DIAGNOSIS — J439 Emphysema, unspecified: Secondary | ICD-10-CM

## 2016-07-29 NOTE — Progress Notes (Signed)
Daily Session Note  Patient Details  Name: Catherine Landry MRN: [id number] Date of Birth: 12-Apr-1936 Referring Provider:   Flowsheet Row Pulmonary Rehab from 07/14/2016 in [facility] Cardiac and Pulmonary Rehab  Referring Provider  Ramachandran      Encounter Date: 07/29/2016  Check In:     Session Check In - 07/29/16 1134      Check-In   Location [facility]-Cardiac & Pulmonary Rehab   Staff Present Gerlene Burdock, RN, BSN;Susanne Bice, RN, BSN, Lance Sell, BA, ACSM CEP, Exercise Physiologist   Supervising physician immediately available to respond to emergencies [facility] immediately available ER MD   Physician(s) Dr. Ciro Backer and Burlene Arnt   Medication changes reported     No   Fall or balance concerns reported    No   Warm-up and Cool-down Performed as group-led instruction   Resistance Training Performed Yes   VAD Patient? No     Pain Assessment   Currently in Pain? No/denies         Goals Met:  Proper associated with RPD/PD & O2 Sat Exercise tolerated well  Goals Unmet:  Not Applicable  Comments:     Dr. Emily Filbert is Medical Director for [place] and [facility].

## 2016-07-31 ENCOUNTER — Encounter: Payer: Medicare Other | Admitting: *Deleted

## 2016-07-31 DIAGNOSIS — J439 Emphysema, unspecified: Secondary | ICD-10-CM

## 2016-07-31 DIAGNOSIS — J438 Other emphysema: Secondary | ICD-10-CM | POA: Diagnosis not present

## 2016-07-31 NOTE — Progress Notes (Signed)
Daily Session Note  Patient Details  Name: Safiyyah Vasconez MRN: [id number] Date of Birth: 1936/05/20 Referring Provider:   Flowsheet Row Pulmonary Rehab from 07/14/2016 in [facility] Cardiac and Pulmonary Rehab  Referring Provider  Ramachandran      Encounter Date: 07/31/2016  Check In:     Session Check In - 07/31/16 1120      Check-In   Location [facility]-Cardiac & Pulmonary Rehab   Staff Present Heath Lark, RN, BSN, CCRP;Gayleen Sholtz, RN, Vickki Hearing, BA, ACSM CEP, Exercise Physiologist   Supervising physician immediately available to respond to emergencies [facility] immediately available ER MD   Physician(s) Dr. Archie Balboa and Dr. Clearnce Hasten   Medication changes reported     No   Fall or balance concerns reported    No   Warm-up and Cool-down Performed as group-led instruction   Resistance Training Performed Yes   VAD Patient? No     Pain Assessment   Currently in Pain? No/denies         Goals Met:  Proper associated with RPD/PD & O2 Sat Exercise tolerated well  Goals Unmet:  Not Applicable  Comments:     Dr. Emily Filbert is Medical Director for [place] and [facility].

## 2016-08-03 ENCOUNTER — Encounter: Payer: Medicare Other | Admitting: *Deleted

## 2016-08-03 DIAGNOSIS — J438 Other emphysema: Secondary | ICD-10-CM | POA: Diagnosis not present

## 2016-08-03 DIAGNOSIS — J439 Emphysema, unspecified: Secondary | ICD-10-CM

## 2016-08-03 NOTE — Progress Notes (Signed)
Daily Session Note  Patient Details  Name: Camri Molloy MRN: [id number] Date of Birth: [birth year] Referring Provider:   Flowsheet Row Pulmonary Rehab from 07/14/2016 in [facility] Cardiac and Pulmonary Rehab  Referring Provider  Ramachandran      Encounter Date: 08/03/2016  Check In:     Session Check In - 08/03/16 1123      Check-In   Location [facility]-Cardiac & Pulmonary Rehab   Staff Present Gerlene Burdock, RN, Moises Blood, BS, ACSM CEP, Exercise Physiologist;Amanda Oletta Darter, BA, ACSM CEP, Exercise Physiologist   Supervising physician immediately available to respond to emergencies [facility] immediately available ER MD   Physician(s) Cinda Quest and Joni Fears   Medication changes reported     No   Fall or balance concerns reported    No   Warm-up and Cool-down Performed on first and last piece of equipment   Resistance Training Performed Yes   VAD Patient? No     Pain Assessment   Currently in Pain? No/denies   Multiple Pain Sites No         Goals Met:  Proper associated with RPD/PD & O2 Sat Independence with exercise equipment Exercise tolerated well Strength training completed today  Goals Unmet:  Not Applicable  Comments: Pt able to follow exercise prescription today without complaint.  Will continue to monitor for progression.    Dr. Emily Filbert is Medical Director for [place] and [facility].

## 2016-08-05 ENCOUNTER — Other Ambulatory Visit: Payer: Self-pay | Admitting: Gastroenterology

## 2016-08-05 ENCOUNTER — Encounter: Payer: Medicare Other | Admitting: *Deleted

## 2016-08-05 DIAGNOSIS — R1310 Dysphagia, unspecified: Secondary | ICD-10-CM

## 2016-08-05 DIAGNOSIS — J439 Emphysema, unspecified: Secondary | ICD-10-CM

## 2016-08-05 DIAGNOSIS — J438 Other emphysema: Secondary | ICD-10-CM | POA: Diagnosis not present

## 2016-08-05 NOTE — Progress Notes (Signed)
Daily Session Note  Patient Details  Name: Catherine Landry MRN: [id number] Date of Birth: 07-Sep-1936 Referring Provider:   April Manson Pulmonary Rehab from 07/14/2016 in [facility] Cardiac and Pulmonary Rehab  Referring Provider  Ramachandran      Encounter Date: 08/05/2016  Check In:     Session Check In - 08/05/16 1013      Check-In   Location [facility]-Cardiac & Pulmonary Rehab   Staff Present Alberteen Sam, MA, ACSM RCEP, Exercise Physiologist;Laureen Owens Shark, BS, RRT, Respiratory Dareen Piano, BA, ACSM CEP, Exercise Physiologist   Supervising physician immediately available to respond to emergencies See telemetry face sheet for immediately available ER MD   Physician(s) Drs. Alfred Levins and [place]   Medication changes reported     No   Fall or balance concerns reported    No   Warm-up and Cool-down Performed as group-led Location manager Performed Yes   VAD Patient? No     Pain Assessment   Currently in Pain? No/denies   Multiple Pain Sites No         Goals Met:  Proper associated with RPD/PD & O2 Sat Independence with exercise equipment Using PLB without cueing & demonstrates good technique Exercise tolerated well Strength training completed today  Goals Unmet:  Not Applicable  Comments: Pt able to follow exercise prescription today without complaint.  Will continue to monitor for progression.    Dr. Emily Filbert is Medical Director for [place] and [facility].

## 2016-08-07 ENCOUNTER — Encounter: Payer: Medicare Other | Admitting: *Deleted

## 2016-08-07 DIAGNOSIS — J439 Emphysema, unspecified: Secondary | ICD-10-CM

## 2016-08-07 DIAGNOSIS — J438 Other emphysema: Secondary | ICD-10-CM | POA: Diagnosis not present

## 2016-08-07 NOTE — Progress Notes (Signed)
Daily Session Note  Patient Details  Name: Jerald Villalona MRN: [id number] Date of Birth: 06/07/1936 Referring Provider:   April Manson Pulmonary Rehab from 07/14/2016 in [facility] Cardiac and Pulmonary Rehab  Referring Provider  Ramachandran      Encounter Date: 08/07/2016  Check In:     Session Check In - 08/07/16 1042      Check-In   Location [facility]-Cardiac & Pulmonary Rehab   Staff Present Nyoka Cowden, RN, BSN, MA;Britania Shreeve, RN, Vickki Hearing, BA, ACSM CEP, Exercise Physiologist   Supervising physician immediately available to respond to emergencies [facility] immediately available ER MD   Physician(s) Dr. Jimmye Norman and Dr. Jacqualine Code   Medication changes reported     No   Fall or balance concerns reported    No   Warm-up and Cool-down Performed as group-led instruction   Resistance Training Performed Yes   VAD Patient? No     Pain Assessment   Currently in Pain? No/denies         Goals Met:  Proper associated with RPD/PD & O2 Sat Exercise tolerated well  Goals Unmet:  Not Applicable  Comments:     Dr. Emily Filbert is Medical Director for [place] and [facility].

## 2016-08-10 ENCOUNTER — Encounter: Payer: Medicare Other | Admitting: *Deleted

## 2016-08-10 DIAGNOSIS — J438 Other emphysema: Secondary | ICD-10-CM | POA: Diagnosis not present

## 2016-08-10 DIAGNOSIS — J439 Emphysema, unspecified: Secondary | ICD-10-CM

## 2016-08-10 NOTE — Progress Notes (Signed)
Daily Session Note  Patient Details  Name: Catherine Landry MRN: [id number] Date of Birth: Feb 07, 1936 Referring Provider:   April Manson Pulmonary Rehab from 07/14/2016 in [facility] Cardiac and Pulmonary Rehab  Referring Provider  Ramachandran      Encounter Date: 08/10/2016  Check In:     Session Check In - 08/10/16 1019      Check-In   Location [facility]-Cardiac & Pulmonary Rehab   Staff Present Carson Myrtle, BS, RRT, Respiratory Therapist;Deontre Allsup Amedeo Plenty, BS, ACSM CEP, Exercise Physiologist;Amanda Oletta Darter, BA, ACSM CEP, Exercise Physiologist   Supervising physician immediately available to respond to emergencies [facility] immediately available ER MD   Physician(s) Drs. Mal[place] and [place]   Medication changes reported     No   Fall or balance concerns reported    No   Warm-up and Cool-down Performed on first and last piece of equipment   Resistance Training Performed Yes   VAD Patient? No     Pain Assessment   Currently in Pain? No/denies   Multiple Pain Sites No         Goals Met:  Proper associated with RPD/PD & O2 Sat Independence with exercise equipment Exercise tolerated well Strength training completed today  Goals Unmet:  Not Applicable  Comments: Pt able to follow exercise prescription today without complaint.  Will continue to monitor for progression.    Dr. Emily Filbert is Medical Director for [place] and [facility].

## 2016-08-11 ENCOUNTER — Ambulatory Visit
Admission: RE | Admit: 2016-08-11 | Discharge: 2016-08-11 | Disposition: A | Discharge status: Home / Self Care | Payer: Medicare Other | Source: Ambulatory Visit | Attending: Gastroenterology | Admitting: Gastroenterology

## 2016-08-11 DIAGNOSIS — R1310 Dysphagia, unspecified: Secondary | ICD-10-CM | POA: Insufficient documentation

## 2016-08-11 NOTE — Progress Notes (Signed)
Pulmonary Individual Treatment Plan  Patient Details  Name: Catherine Landry MRN: [id number] Date of Birth: [birth year] Referring Provider:   Flowsheet Row Pulmonary Rehab from 07/14/2016 in [facility] Cardiac and Pulmonary Rehab  Referring Provider  Ramachandran      Initial Encounter Date:  Flowsheet Row Pulmonary Rehab from 07/14/2016 in [facility] Cardiac and Pulmonary Rehab  Date  07/14/16  Referring Provider  Ashby Dawes      Visit Diagnosis: Pulmonary emphysema, unspecified emphysema type ([place])  Patient's Home Medications on Admission:  Current Outpatient Prescriptions:    ADVAIR DISKUS 250-50 MCG/DOSE AEPB, Inhale 1 puff into the lungs 2 (two) times daily. *Rinse mouth after use*, Disp: , Rfl: 1   albuterol (PROVENTIL) (2.5 MG/3ML) 0.083% nebulizer solution, Take 3-6 mLs by nebulization every 6 (six) hours as needed. For shortness of breath/wheezing., Disp: , Rfl: 5   aspirin EC 81 MG tablet, Take 81 mg by mouth every morning., Disp: , Rfl:    bisacodyl (DULCOLAX) 5 MG EC tablet, Take 1 tablet (5 mg total) by mouth daily as needed for moderate constipation., Disp: 30 tablet, Rfl: 0   Calcium-Magnesium-Vitamin D (CALCIUM 1200+D3 PO), Take 1 tablet by mouth every morning., Disp: , Rfl:    COMBIVENT RESPIMAT 20-100 MCG/ACT AERS respimat, Inhale 1 puff into the lungs 4 (four) times daily as needed. For COPD., Disp: , Rfl: 5   dicyclomine (BENTYL) 10 MG capsule, Take 10 mg by mouth 2 (two) times daily as needed for muscle spasms. For up to 10 days, Disp: , Rfl:    levofloxacin (LEVAQUIN) 500 MG tablet, Take 1 tablet (500 mg total) by mouth daily., Disp: 5 tablet, Rfl: 0   losartan (COZAAR) 50 MG tablet, Take 50 mg by mouth every morning., Disp: , Rfl:    metoprolol succinate (TOPROL-XL) 25 MG 24 hr tablet, Take 25 mg by mouth every morning., Disp: , Rfl:    Multiple Vitamin (MULTIVITAMIN WITH MINERALS) TABS, Take 1 tablet by mouth every morning., Disp: , Rfl:     NEOMYCIN-POLYMYXIN-HYDROCORTISONE (CORTISPORIN) 1 % SOLN otic solution, Apply 1-2 drops to toe BID after soaking, Disp: 10 mL, Rfl: 1   omeprazole (PRILOSEC) 40 MG capsule, Take 40 mg by mouth 3 (three) times a week. , Disp: , Rfl: 5   polyethylene glycol powder (GLYCOLAX/MIRALAX) powder, Take 17 g by mouth daily as needed for constipation. Mix with 4 to 8 ounces of fluid., Disp: , Rfl:    predniSONE (STERAPRED UNI-PAK 21 TAB) 10 MG (21) TBPK tablet, Take 6 tablets on Day 1, then 5-4-3-2-1 until complete, Disp: 21 tablet, Rfl: 0   theophylline (THEO-24) 300 MG 24 hr capsule, Take 1 capsule (300 mg total) by mouth every morning., Disp: 30 capsule, Rfl: 0  Past Medical History: Past Medical History:  Diagnosis Date   Asthma    Bladder cancer ([place]) 2015   Cancer ([place]) 2001   kidney left    Hypertension    Renal insufficiency     Tobacco Use: History  Smoking Status   Former Smoker   Packs/day: 1.50   Quit date: 12/09/1999  Smokeless Tobacco   Never Used    Labs: Recent Review Flowsheet Data    There is no flowsheet data to display.       ADL UCSD:     Pulmonary Assessment Scores    Row Name 07/14/16 1253         ADL UCSD   ADL Phase Entry     SOB Score total 68  Rest 0     Walk 2     Stairs 4     Bath 3     Dress 2     Shop 5        Pulmonary Function Assessment:     Pulmonary Function Assessment - 07/14/16 1250      Initial Spirometry Results   FVC% 28 %   FEV1% 24 %   FEV1/FVC Ratio 65.06   Comments Test date 07/14/16     Post Bronchodilator Spirometry Results   FVC% 43 %   FEV1% 28 %   FEV1/FVC Ratio 58.31     Breath   Bilateral Breath Sounds Clear;Decreased   Shortness of Breath Yes;Limiting activity;Fear of Shortness of Breath      Exercise Target Goals:    Exercise Program Goal: Individual exercise prescription set with THRR, safety & activity barriers. Participant demonstrates ability to understand and report RPE using  BORG scale, to self-measure pulse accurately, and to acknowledge the importance of the exercise prescription.  Exercise Prescription Goal: Starting with aerobic activity 30 plus minutes a day, 3 days per week for initial exercise prescription. Provide home exercise prescription and guidelines that participant acknowledges understanding prior to discharge.  Activity Barriers & Risk Stratification:     Activity Barriers & Cardiac Risk Stratification - 07/14/16 1249      Activity Barriers & Cardiac Risk Stratification   Activity Barriers Arthritis;Back Problems;Shortness of Breath;Deconditioning;Right Hip Replacement   Cardiac Risk Stratification Moderate      6 Minute Walk:     6 Minute Walk    Row Name 07/14/16 1300 07/14/16 1329       6 Minute Walk   Distance 712 feet 712 feet    Walk Time 4.75 minutes 4.75 minutes    # of Rest Breaks 3 3    MPH 1.7 1.7    METS 1.43 2.3    RPE 15 15    Perceived Dyspnea  4 4    VO2 Peak 5.02 5.02    Symptoms Yes (comment) Yes (comment)    Comments Short of breath short of breath    Resting HR 78 bpm 78 bpm    Resting BP 134/72 134/72    Max Ex. HR 102 bpm 102 bpm    Max Ex. BP 134/60 134/60      Interval HR   Baseline HR 78 78    1 Minute HR 98 98    2 Minute HR 102 102    3 Minute HR 98 98    4 Minute HR 102 102    5 Minute HR 96 96    6 Minute HR 101 101    2 Minute Post HR 87 87    Interval Heart Rate? Yes Yes      Interval Oxygen   Interval Oxygen? Yes  --    Baseline Oxygen Saturation % 95 % 95 %    Baseline Liters of Oxygen 3 L 3 L    1 Minute Oxygen Saturation % 85 % 85 %    1 Minute Liters of Oxygen 3 L 3 L    2 Minute Oxygen Saturation % 86 % 86 %    2 Minute Liters of Oxygen 3 L 3 L    3 Minute Oxygen Saturation % 86 % 86 %    3 Minute Liters of Oxygen 3 L 3 L    4 Minute Oxygen Saturation % 85 % 85 %    4  Minute Liters of Oxygen 3 L 3 L    5 Minute Oxygen Saturation % 86 % 86 %    5 Minute Liters of Oxygen 3 L  3 L    6 Minute Oxygen Saturation % 83 % 83 %    6 Minute Liters of Oxygen 3 L 3 L    2 Minute Post Oxygen Saturation % 94 % 94 %    2 Minute Post Liters of Oxygen 3 L 3 L       Initial Exercise Prescription:     Initial Exercise Prescription - 07/14/16 1300      Date of Initial Exercise RX and Referring Provider   Date 07/14/16   Referring Provider Ashby Dawes     Oxygen   Oxygen Continuous   Liters 3     Treadmill   MPH 1.5   Grade 0   Minutes 15  3/3/3/3   METs 2.15     REL-XR   Level 2   Minutes 15   METs 2     T5 Nustep   Level 1   Minutes 15   METs 2     Prescription Details   Frequency (times per week) 3   Duration Progress to 45 minutes of aerobic exercise without signs/symptoms of physical distress     Intensity   THRR 40-80% of Max Heartrate 104-129   Ratings of Perceived Exertion 11-13   Perceived Dyspnea 0-4     Progression   Progression Continue to progress workloads to maintain intensity without signs/symptoms of physical distress.     Resistance Training   Training Prescription Yes   Weight 2   Reps 10-12      Perform Capillary Blood Glucose checks as needed.  Exercise Prescription Changes:     Exercise Prescription Changes    Row Name 07/27/16 1200 07/30/16 1100           Response to Exercise   Blood Pressure (Admit) 114/60 126/56      Blood Pressure (Exercise) 130/80 124/64      Blood Pressure (Exit) 130/68 128/70      Heart Rate (Admit) 82 bpm 82 bpm      Heart Rate (Exercise) 102 bpm 97 bpm      Heart Rate (Exit) 90 bpm 70 bpm      Oxygen Saturation (Admit) 95 % 95 %      Oxygen Saturation (Exercise) 86 %  Sat dropped to 86 at RA on TM. W/ 2L of O2 Sat increased 91. 91 %      Oxygen Saturation (Exit) 93 % 93 %      Rating of Perceived Exertion (Exercise) 12 12      Perceived Dyspnea (Exercise) 2 0        Progression   Progression  -- Continue to progress workloads to maintain intensity without signs/symptoms of  physical distress.        Resistance Training   Training Prescription Yes Yes      Weight 2 2      Reps 10-12 10-12        Interval Training   Interval Training  -- No        Oxygen   Oxygen Continuous Continuous      Liters 3 2  added om treadmill only        Treadmill   MPH 1.5 1      Grade 0 0      Minutes 15  3/3/3/3 15  METs 2.15  --        NuStep   Level  -- 1      Minutes  -- 15        REL-XR   Level 2 2      Minutes 15 15      METs 2 1.7        T5 Nustep   Level 1  --      Minutes 15  --      METs 2  --         Exercise Comments:     Exercise Comments    Row Name 07/14/16 1312 07/27/16 1218 07/30/16 1147       Exercise Comments Ms Tamburro began the test on room air but droppped quickly too low to continue the test.  She completed the test on 3L continuous oxygen. First full day of exercise!  Patient was oriented to gym and equipment including functions, settings, policies, and procedures.  Patient's individual exercise prescription and treatment plan were reviewed.  All starting workloads were established based on the results of the 6 minute walk test done at initial orientation visit.  The plan for exercise progression was also introduced and progression will be customized based on patient's performance and goals. Ms Masseys 02 dropped to 39 on Tm so she has added 2L continuous oxygen on TM only.  O2 sats were 91 with oxygen.        Discharge Exercise Prescription (Final Exercise Prescription Changes):     Exercise Prescription Changes - 07/30/16 1100      Response to Exercise   Blood Pressure (Admit) 126/56   Blood Pressure (Exercise) 124/64   Blood Pressure (Exit) 128/70   Heart Rate (Admit) 82 bpm   Heart Rate (Exercise) 97 bpm   Heart Rate (Exit) 70 bpm   Oxygen Saturation (Admit) 95 %   Oxygen Saturation (Exercise) 91 %   Oxygen Saturation (Exit) 93 %   Rating of Perceived Exertion (Exercise) 12   Perceived Dyspnea (Exercise) 0      Progression   Progression Continue to progress workloads to maintain intensity without signs/symptoms of physical distress.     Resistance Training   Training Prescription Yes   Weight 2   Reps 10-12     Interval Training   Interval Training No     Oxygen   Oxygen Continuous   Liters 2  added om treadmill only     Treadmill   MPH 1   Grade 0   Minutes 15     NuStep   Level 1   Minutes 15     REL-XR   Level 2   Minutes 15   METs 1.7       Nutrition:  Target Goals: Understanding of nutrition guidelines, daily intake of sodium <1539m, cholesterol <2055m calories 30% from fat and 7% or less from saturated fats, daily to have 5 or more servings of fruits and vegetables.  Biometrics:     Pre Biometrics - 07/14/16 1257      Pre Biometrics   Height 5' 2.5" (1.588 m)   Weight 147 lb 12.8 oz (67 kg)   Waist Circumference 33.5 inches   Hip Circumference 42.5 inches   Waist to Hip Ratio 0.79 %   BMI (Calculated) 26.7       Nutrition Therapy Plan and Nutrition Goals:   Nutrition Discharge: Rate Your Plate Scores:   Psychosocial: Target Goals: Acknowledge presence or absence of depression,  maximize coping skills, provide positive support system. Participant is able to verbalize types and ability to use techniques and skills needed for reducing stress and depression.  Initial Review & Psychosocial Screening:     Initial Psych Review & Screening - 07/14/16 [place]? Yes   Comments Ms Krogstad has good support from he husband of 56yr. She has faced cancer twice and now COPD, but she has a very positive outlook on life. One concern is her husband's early stages of Alzheimers and his care. Ms MBoylanis looking forward to [place]     Barriers   Psychosocial barriers to participate in program The patient should benefit from training in stress management and relaxation.     Screening Interventions   Interventions  Encouraged to exercise;Program counselor consult      Quality of Life Scores:     Quality of Life - 07/14/16 1307      Quality of Life Scores   Health/Function Pre 21 %   Socioeconomic Pre 21 %   Psych/Spiritual Pre 21 %   Family Pre 21 %   GLOBAL Pre 21 %      PHQ-9: Recent Review Flowsheet Data    [facility] 07/14/2016   Decreased Interest 0   Down, Depressed, Hopeless 0   PHQ - 2 Score 0   Altered sleeping 0   Tired, decreased energy 1   Change in appetite 1   Feeling bad or failure about yourself  0   Trouble concentrating 0   Moving slowly or fidgety/restless 0   Suicidal thoughts 0   PHQ-9 Score 2   Difficult doing work/chores Not difficult at all      Psychosocial Evaluation and Intervention:     Psychosocial Evaluation - 07/27/16 1149      Psychosocial Evaluation & Interventions   Interventions Encouraged to exercise with the program and follow exercise prescription   Comments Counselor met with Ms. MBurnett Harry(Ms. MJerilynn Mages today for initial psychosocial evaluation.  She is a 80year old who has been diagnosed with COPD.  She has a strong support system with a spouse of over 413years; (2) adult children who live closeby; and a sister and niece locally as well.  Ms. MJerilynn Mageshas a healthy history of bladder cancer and [facility]  She also lost a kidney to cancer in 2000.  She reports sleeping well and having a good appetite.  She denies a history of depression or anxiety or any current symptoms. She states she is typically positive most of the time and has minimal stress in her life currently.  She has goals to walk better without being short of breath.  Staff will follow with Ms. M throughout the course of this program.        Psychosocial Re-Evaluation:  Education: Education Goals: Education classes will be provided on a weekly basis, covering required topics. Participant will state  understanding/return demonstration of topics presented.  Learning Barriers/Preferences:     Learning Barriers/Preferences - 07/14/16 1250      Learning Barriers/Preferences   Learning Barriers None   Learning Preferences None      Education Topics: Initial Evaluation Education: - Verbal, written and demonstration of respiratory meds, RPE/PD scales, oximetry and breathing techniques. Instruction on use of nebulizers and MDIs: cleaning and proper use, rinsing mouth with steroid doses and importance of monitoring MDI activations.  Flowsheet Row Pulmonary Rehab from 08/10/2016 in [facility] Cardiac and Pulmonary Rehab  Date  07/14/16  Educator  LB  Instruction Review Code  2- meets goals/outcomes      General Nutrition Guidelines/Fats and Fiber: -Group instruction provided by verbal, written material, models and posters to present the general guidelines for heart healthy nutrition. Gives an explanation and review of dietary fats and fiber. Flowsheet Row Pulmonary Rehab from 08/10/2016 in [facility] Cardiac and Pulmonary Rehab  Date  08/10/16  Educator  CR  Instruction Review Code  2- meets goals/outcomes      Controlling Sodium/Reading Food Labels: -Group verbal and written material supporting the discussion of sodium use in heart healthy nutrition. Review and explanation with models, verbal and written materials for utilization of the food label.   Exercise Physiology & Risk Factors: - Group verbal and written instruction with models to review the exercise physiology of the cardiovascular system and associated critical values. Details cardiovascular disease risk factors and the goals associated with each risk factor. Flowsheet Row Pulmonary Rehab from 08/10/2016 in [facility] Cardiac and Pulmonary Rehab  Date  08/05/16  Educator  AS  Instruction Review Code  2- meets goals/outcomes      Aerobic Exercise & Resistance Training: - Gives group verbal and written discussion on the health impact of  inactivity. On the components of aerobic and resistive training programs and the benefits of this training and how to safely progress through these programs.   Flexibility, Balance, General Exercise Guidelines: - Provides group verbal and written instruction on the benefits of flexibility and balance training programs. Provides general exercise guidelines with specific guidelines to those with heart or lung disease. Demonstration and skill practice provided.   Stress Management: - Provides group verbal and written instruction about the health risks of elevated stress, cause of high stress, and healthy ways to reduce stress.   Depression: - Provides group verbal and written instruction on the correlation between heart/lung disease and depressed mood, treatment options, and the stigmas associated with seeking treatment.   Exercise & Equipment Safety: - Individual verbal instruction and demonstration of equipment use and safety with use of the equipment. Flowsheet Row Pulmonary Rehab from 08/10/2016 in [facility] Cardiac and Pulmonary Rehab  Date  07/27/16  Educator  LB  Instruction Review Code  2- meets goals/outcomes      Infection Prevention: - Provides verbal and written material to individual with discussion of infection control including proper hand washing and proper equipment cleaning during exercise session. Flowsheet Row Pulmonary Rehab from 08/10/2016 in [facility] Cardiac and Pulmonary Rehab  Date  07/27/16  Educator  LB  Instruction Review Code  2- meets goals/outcomes      Falls Prevention: - Provides verbal and written material to individual with discussion of falls prevention and safety. Flowsheet Row Pulmonary Rehab from 08/10/2016 in [facility] Cardiac and Pulmonary Rehab  Date  07/14/16  Educator  LB  Instruction Review Code  2- meets goals/outcomes      Diabetes: - Individual verbal and written instruction to review signs/symptoms of diabetes, desired ranges of glucose level  fasting, after meals and with exercise. Advice that pre and post exercise glucose checks will be done for 3 sessions at entry of program.   Chronic Lung Diseases: - Group verbal and written instruction to review new updates, new respiratory medications, new advancements in procedures and treatments. Provide informative websites and "800" numbers of self-education.   Lung Procedures: - Group verbal and written instruction to describe testing methods  done to diagnose lung disease. Review the outcome of test results. Describe the treatment choices: Pulmonary Function Tests, ABGs and oximetry.   Energy Conservation: - Provide group verbal and written instruction for methods to conserve energy, plan and organize activities. Instruct on pacing techniques, use of adaptive equipment and posture/positioning to relieve shortness of breath.   Triggers: - Group verbal and written instruction to review types of environmental controls: home humidity, furnaces, filters, dust mite/pet prevention, HEPA vacuums. To discuss weather changes, air quality and the benefits of nasal washing.   Exacerbations: - Group verbal and written instruction to provide: warning signs, infection symptoms, calling MD promptly, preventive modes, and value of vaccinations. Review: effective airway clearance, coughing and/or vibration techniques. Create an Sports administrator.   Oxygen: - Individual and group verbal and written instruction on oxygen therapy. Includes supplement oxygen, available portable oxygen systems, continuous and intermittent flow rates, oxygen safety, concentrators, and Medicare reimbursement for oxygen. Flowsheet Row Pulmonary Rehab from 08/10/2016 in [facility] Cardiac and Pulmonary Rehab  Date  07/14/16  Educator  LB  Instruction Review Code  2- meets goals/outcomes      Respiratory Medications: - Group verbal and written instruction to review medications for lung disease. Drug class, frequency, complications,  importance of spacers, rinsing mouth after steroid MDI's, and proper cleaning methods for nebulizers. Flowsheet Row Pulmonary Rehab from 08/10/2016 in [facility] Cardiac and Pulmonary Rehab  Date  07/14/16  Educator  LB  Instruction Review Code  2- meets goals/outcomes      AED/CPR: - Group verbal and written instruction with the use of models to demonstrate the basic use of the AED with the basic ABC's of resuscitation. Flowsheet Row Pulmonary Rehab from 08/10/2016 in [facility] Cardiac and Pulmonary Rehab  Date  07/31/16  Educator  C. EnterkinRN  Instruction Review Code  2- meets goals/outcomes      Breathing Retraining: - Provides individuals verbal and written instruction on purpose, frequency, and proper technique of diaphragmatic breathing and pursed-lipped breathing. Applies individual practice skills. Flowsheet Row Pulmonary Rehab from 08/10/2016 in [facility] Cardiac and Pulmonary Rehab  Date  07/27/16  Educator  LB  Instruction Review Code  2- meets goals/outcomes      Anatomy and Physiology of the Lungs: - Group verbal and written instruction with the use of models to provide basic lung anatomy and physiology related to function, structure and complications of lung disease.   Heart Failure: - Group verbal and written instruction on the basics of heart failure: signs/symptoms, treatments, explanation of ejection fraction, enlarged heart and cardiomyopathy.   Sleep Apnea: - Individual verbal and written instruction to review Obstructive Sleep Apnea. Review of risk factors, methods for diagnosing and types of masks and machines for OSA.   Anxiety: - Provides group, verbal and written instruction on the correlation between heart/lung disease and anxiety, treatment options, and management of anxiety.   Relaxation: - Provides group, verbal and written instruction about the benefits of relaxation for patients with heart/lung disease. Also provides patients with examples of relaxation  techniques.   Knowledge Questionnaire Score:     Knowledge Questionnaire Score - 07/14/16 1250      Knowledge Questionnaire Score   Pre Score 8/10       Core Components/Risk Factors/Patient Goals at Admission:     Personal Goals and Risk Factors at Admission - 07/14/16 1257      Core Components/Risk Factors/Patient Goals on Admission   Sedentary Yes  Likes to walk and camp   Intervention Provide  advice, education, support and counseling about physical activity/exercise needs.;Develop an individualized exercise prescription for aerobic and resistive training based on initial evaluation findings, risk stratification, comorbidities and participant's personal goals.   Expected Outcomes Achievement of increased cardiorespiratory fitness and enhanced flexibility, muscular endurance and strength shown through measurements of functional capacity and personal statement of participant.   Increase Strength and Stamina Yes   Intervention Provide advice, education, support and counseling about physical activity/exercise needs.;Develop an individualized exercise prescription for aerobic and resistive training based on initial evaluation findings, risk stratification, comorbidities and participant's personal goals.   Expected Outcomes Achievement of increased cardiorespiratory fitness and enhanced flexibility, muscular endurance and strength shown through measurements of functional capacity and personal statement of participant.   Improve shortness of breath with ADL's Yes   Intervention Provide education, individualized exercise plan and daily activity instruction to help decrease symptoms of SOB with activities of daily living.   Expected Outcomes Short Term: Achieves a reduction of symptoms when performing activities of daily living.   Develop more efficient breathing techniques such as purse lipped breathing and diaphragmatic breathing; and practicing self-pacing with activity Yes   Intervention  Provide education, demonstration and support about specific breathing techniuqes utilized for more efficient breathing. Include techniques such as pursed lipped breathing, diaphragmatic breathing and self-pacing activity.   Expected Outcomes Short Term: Participant will be able to demonstrate and use breathing techniques as needed throughout daily activities.   Increase knowledge of respiratory medications and ability to use respiratory devices properly  Yes  Advair, Combivent Respimat, SVN Albuterol; oxygen 2l/m at night and as needed   Intervention Provide education and demonstration as needed of appropriate use of medications, inhalers, and oxygen therapy.   Expected Outcomes Short Term: Achieves understanding of medications use. Understands that oxygen is a medication prescribed by physician. Demonstrates appropriate use of inhaler and oxygen therapy.   Hypertension Yes   Intervention Provide education on lifestyle modifcations including regular physical activity/exercise, weight management, moderate sodium restriction and increased consumption of fresh fruit, vegetables, and low fat dairy, alcohol moderation, and smoking cessation.;Monitor prescription use compliance.   Expected Outcomes Short Term: Continued assessment and intervention until BP is < 140/39m HG in hypertensive participants. < 130/893mHG in hypertensive participants with diabetes, heart failure or chronic kidney disease.;Long Term: Maintenance of blood pressure at goal levels.      Core Components/Risk Factors/Patient Goals Review:      Goals and Risk Factor Review    Row Name 07/27/16 1207 08/10/16 0849           Core Components/Risk Factors/Patient Goals Review   Personal Goals Review Develop more efficient breathing techniques such as purse lipped breathing and diaphragmatic breathing and practicing self-pacing with activity. Sedentary;Increase Strength and Stamina;Improve shortness of breath with ADL's;Increase  knowledge of respiratory medications and ability to use respiratory devices properly.;Develop more efficient breathing techniques such as purse lipped breathing and diaphragmatic breathing and practicing self-pacing with activity.;Hypertension      Review Pursed lip breathing techniques were explained and discussed with the patient. The patient demonstrated understanding of this breathing technique and when to use it during exercise and ADL's.  [place] for sleep, but she has not used it during the day. Walking on the treadmill has been difficult for Ms MaMcmeansith O2Sat's in the mid 80's, so we have added 2-3l/m of oxygen for O2Sat's in the low 90's. Shopping and some housework  make tired, so I have encouraged her to check her O2Sat's to check if they are low and if she should be wearing oxyge, pacing.Ms Bryden has a good understaning of her MDI's and has acceptable BP readings in [facility].      Expected Outcomes Patient will use PLB during exercise and ADL's to help improve oxygen saturations and control SOB.   --         Core Components/Risk Factors/Patient Goals at Discharge (Final Review):      Goals and Risk Factor Review - 08/10/16 0849      Core Components/Risk Factors/Patient Goals Review   Personal Goals Review Sedentary;Increase Strength and Stamina;Improve shortness of breath with ADL's;Increase knowledge of respiratory medications and ability to use respiratory devices properly.;Develop more efficient breathing techniques such as purse lipped breathing and diaphragmatic breathing and practicing self-pacing with activity.;Hypertension   Review Ms Slaby has increased her exercise goals in [place]. She has oxygen at home for sleep, but she has not used it during the day. Walking on the treadmill has been difficult for Ms Tobler with O2Sat's in the mid 80's, so we have added 2-3l/m of oxygen for O2Sat's in the low 90's.  Shopping and some housework make tired, so I have encouraged her to check her O2Sat's to check if they are low and if she should be wearing oxyge, pacing.Ms Fess has a good understaning of her MDI's and has acceptable BP readings in [facility].      ITP Comments:   Comments: Meeting with Dr Emily Filbert, Medical Director of [facility] face to face.

## 2016-08-12 DIAGNOSIS — J438 Other emphysema: Secondary | ICD-10-CM | POA: Diagnosis not present

## 2016-08-12 DIAGNOSIS — J439 Emphysema, unspecified: Secondary | ICD-10-CM

## 2016-08-12 NOTE — Progress Notes (Signed)
Daily Session Note  Patient Details  Name: Catherine Landry MRN: [id number] Date of Birth: Sep 01, 1936 Referring Provider:   Flowsheet Row Pulmonary Rehab from 07/14/2016 in [facility] Cardiac and Pulmonary Re[facility]  Referring Provider  Ramachandran      Encounter Date: 08/12/2016  Check In:     Session Check In - 08/12/16 1056      Check-In   Location [facility]-Cardiac & Pulmonary Rehab   Staff Present Carson Myrtle, BS, RRT, Respiratory Lennie Hummer, MA, ACSM RCEP, Exercise Physiologist;Amanda Oletta Darter, BA, ACSM CEP, Exercise Physiologist;Other   Supervising physician immediately available to respond to emergencies [facility] immediately available ER MD   Physician(s) Marcelene Butte and Alfred Levins   Medication changes reported     No   Fall or balance concerns reported    No   Warm-up and Cool-down Performed as group-led Location manager Performed Yes   VAD Patient? No     Pain Assessment   Currently in Pain? No/denies           Exercise Prescription Changes - 08/11/16 1500      Exercise Review   Progression Yes     Response to Exercise   Blood Pressure (Exercise) 144/70   Blood Pressure (Exit) 120/80   Heart Rate (Exercise) 95 bpm   Heart Rate (Exit) 90 bpm   Oxygen Saturation (Admit) 94 %   Oxygen Saturation (Exercise) 91 %   Oxygen Saturation (Exit) 89 %   Rating of Perceived Exertion (Exercise) 12   Perceived Dyspnea (Exercise) 2     Progression   Progression Continue to progress workloads to maintain intensity without signs/symptoms of physical distress.     Resistance Training   Training Prescription Yes   Weight 2   Reps 10-12     Interval Training   Interval Training No     Oxygen   Oxygen Continuous   Liters 2     Treadmill   MPH 1.1   Grade 0   Minutes 15     NuStep   Level 2   Minutes 15     REL-XR   Level 2   Minutes 15      Goals Met:  Proper associated with RPD/PD & O2 Sat Independence with exercise  equipment Exercise tolerated well Strength training completed today  Goals Unmet:  Not Applicable  Comments: Pt able to follow exercise prescription today without complaint.  Will continue to monitor for progression.    Dr. Emily Filbert is Medical Director for [place] and [facility].

## 2016-08-17 ENCOUNTER — Encounter: Payer: Medicare Other | Admitting: *Deleted

## 2016-08-17 ENCOUNTER — Encounter: Payer: Self-pay | Admitting: Respiratory Therapy

## 2016-08-17 DIAGNOSIS — J439 Emphysema, unspecified: Secondary | ICD-10-CM

## 2016-08-17 DIAGNOSIS — J438 Other emphysema: Secondary | ICD-10-CM | POA: Diagnosis not present

## 2016-08-17 NOTE — Progress Notes (Signed)
Daily Session Note  Patient Details  Name: Catherine Landry MRN: [id number] Date of Birth: Jul 08, 1936 Referring Provider:   April Manson Pulmonary Rehab from 07/14/2016 in [facility] Card[facility] and Pulmonary Rehab  Referring Provider  Ramachandran      Encounter Date: 08/17/2016  Check In:     Session Check In - 08/17/16 1018      Check-In   Location [facility]-Cardiac & Pulmonary Rehab   Staff Present Carson Myrtle, BS, RRT, Respiratory Therapist;Barbee Mamula Amedeo Plenty, BS, ACSM CEP, Exercise Physiologist;Amanda Oletta Darter, BA, ACSM CEP, Exercise Physiologist   Supervising physician immediately available to respond to emergencies [facility] immediately available ER MD   Physician(s) Drs. Joni Fears and [place]   Medication changes reported     No   Fall or balance concerns reported    No   Warm-up and Cool-down Performed on first and last piece of equipment   Resistance Training Performed Yes   VAD Patient? No     Pain Assessment   Currently in Pain? No/denies   Multiple Pain Sites No         Goals Met:  Proper associated with RPD/PD & O2 Sat Independence with exercise equipment Exercise tolerated well Strength training completed today  Goals Unmet:  Not Applicable  Comments: Pt able to follow exercise prescription today without complaint.  Will continue to monitor for progression.    Dr. Emily Filbert is Medical Director for [place] and [facility].

## 2016-08-17 NOTE — Progress Notes (Signed)
Pulmonary Individual Treatment Plan  Patient Details  Name: Catherine Landry MRN: [id number] Date of Birth: [birth year] Referring Provider:   Flowsheet Row Pulmonary Rehab from 07/14/2016 in [facility] Cardiac and Pulmonary Rehab  Referring Provider  Ramachandran      Initial Encounter Date:  Flowsheet Row Pulmonary Rehab from 07/14/2016 in [facility] Cardiac and Pulmonary Rehab  Date  07/14/16  Referring Provider  Ashby Dawes      Visit Diagnosis: Pulmonary emphysema, unspecified emphysema type ([place])  Patient's Home Medications on Admission:  Current Outpatient Prescriptions:    ADVAIR DISKUS 250-50 MCG/DOSE AEPB, Inhale 1 puff into the lungs 2 (two) times daily. *Rinse mouth after use*, Disp: , Rfl: 1   albuterol (PROVENTIL) (2.5 MG/3ML) 0.083% nebulizer solution, Take 3-6 mLs by nebulization every 6 (six) hours as needed. For shortness of breath/wheezing., Disp: , Rfl: 5   aspirin EC 81 MG tablet, Take 81 mg by mouth every morning., Disp: , Rfl:    bisacodyl (DULCOLAX) 5 MG EC tablet, Take 1 tablet (5 mg total) by mouth daily as needed for moderate constipation., Disp: 30 tablet, Rfl: 0   Calcium-Magnesium-Vitamin D (CALCIUM 1200+D3 PO), Take 1 tablet by mouth every morning., Disp: , Rfl:    COMBIVENT RESPIMAT 20-100 MCG/ACT AERS respimat, Inhale 1 puff into the lungs 4 (four) times daily as needed. For COPD., Disp: , Rfl: 5   dicyclomine (BENTYL) 10 MG capsule, Take 10 mg by mouth 2 (two) times daily as needed for muscle spasms. For up to 10 days, Disp: , Rfl:    levofloxacin (LEVAQUIN) 500 MG tablet, Take 1 tablet (500 mg total) by mouth daily., Disp: 5 tablet, Rfl: 0   losartan (COZAAR) 50 MG tablet, Take 50 mg by mouth every morning., Disp: , Rfl:    metoprolol succinate (TOPROL-XL) 25 MG 24 hr tablet, Take 25 mg by mouth every morning., Disp: , Rfl:    Multiple Vitamin (MULTIVITAMIN WITH MINERALS) TABS, Take 1 tablet by mouth every morning., Disp: , Rfl:     NEOMYCIN-POLYMYXIN-HYDROCORTISONE (CORTISPORIN) 1 % SOLN otic solution, Apply 1-2 drops to toe BID after soaking, Disp: 10 mL, Rfl: 1   omeprazole (PRILOSEC) 40 MG capsule, Take 40 mg by mouth 3 (three) times a week. , Disp: , Rfl: 5   polyethylene glycol powder (GLYCOLAX/MIRALAX) powder, Take 17 g by mouth daily as needed for constipation. Mix with 4 to 8 ounces of fluid., Disp: , Rfl:    predniSONE (STERAPRED UNI-PAK 21 TAB) 10 MG (21) TBPK tablet, Take 6 tablets on Day 1, then 5-4-3-2-1 until complete, Disp: 21 tablet, Rfl: 0   theophylline (THEO-24) 300 MG 24 hr capsule, Take 1 capsule (300 mg total) by mouth every morning., Disp: 30 capsule, Rfl: 0  Past Medical History: Past Medical History:  Diagnosis Date   Asthma    Bladder cancer ([place]) 2015   Cancer ([place]) 2001   kidney left    Hypertension    Renal insufficiency     Tobacco Use: History  Smoking Status   Former Smoker   Packs/day: 1.50   Quit date: 12/09/1999  Smokeless Tobacco   Never Used    Labs: Recent Review Flowsheet Data    There is no flowsheet data to display.       ADL UCSD:     Pulmonary Assessment Scores    Row Name 07/14/16 1253         ADL UCSD   ADL Phase Entry     SOB Score total 68  Rest 0     Walk 2     Stairs 4     Bath 3     Dress 2     Shop 5        Pulmonary Function Assessment:     Pulmonary Function Assessment - 07/14/16 1250      Initial Spirometry Results   FVC% 28 %   FEV1% 24 %   FEV1/FVC Ratio 65.06   Comments Test date 07/14/16     Post Bronchodilator Spirometry Results   FVC% 43 %   FEV1% 28 %   FEV1/FVC Ratio 58.31     Breath   Bilateral Breath Sounds Clear;Decreased   Shortness of Breath Yes;Limiting activity;Fear of Shortness of Breath      Exercise Target Goals:    Exercise Program Goal: Individual exercise prescription set with THRR, safety & activity barriers. Participant demonstrates ability to understand and report RPE using  BORG scale, to self-measure pulse accurately, and to acknowledge the importance of the exercise prescription.  Exercise Prescription Goal: Starting with aerobic activity 30 plus minutes a day, 3 days per week for initial exercise prescription. Provide home exercise prescription and guidelines that participant acknowledges understanding prior to discharge.  Activity Barriers & Risk Stratification:     Activity Barriers & Cardiac Risk Stratification - 07/14/16 1249      Activity Barriers & Cardiac Risk Stratification   Activity Barriers Arthritis;Back Problems;Shortness of Breath;Deconditioning;Right Hip Replacement   Cardiac Risk Stratification Moderate      6 Minute Walk:     6 Minute Walk    Row Name 07/14/16 1300 07/14/16 1329       6 Minute Walk   Distance 712 feet 712 feet    Walk Time 4.75 minutes 4.75 minutes    # of Rest Breaks 3 3    MPH 1.7 1.7    METS 1.43 2.3    RPE 15 15    Perceived Dyspnea  4 4    VO2 Peak 5.02 5.02    Symptoms Yes (comment) Yes (comment)    Comments Short of breath short of breath    Resting HR 78 bpm 78 bpm    Resting BP 134/72 134/72    Max Ex. HR 102 bpm 102 bpm    Max Ex. BP 134/60 134/60      Interval HR   Baseline HR 78 78    1 Minute HR 98 98    2 Minute HR 102 102    3 Minute HR 98 98    4 Minute HR 102 102    5 Minute HR 96 96    6 Minute HR 101 101    2 Minute Post HR 87 87    Interval Heart Rate? Yes Yes      Interval Oxygen   Interval Oxygen? Yes  --    Baseline Oxygen Saturation % 95 % 95 %    Baseline Liters of Oxygen 3 L 3 L    1 Minute Oxygen Saturation % 85 % 85 %    1 Minute Liters of Oxygen 3 L 3 L    2 Minute Oxygen Saturation % 86 % 86 %    2 Minute Liters of Oxygen 3 L 3 L    3 Minute Oxygen Saturation % 86 % 86 %    3 Minute Liters of Oxygen 3 L 3 L    4 Minute Oxygen Saturation % 85 % 85 %    4  Minute Liters of Oxygen 3 L 3 L    5 Minute Oxygen Saturation % 86 % 86 %    5 Minute Liters of Oxygen 3 L  3 L    6 Minute Oxygen Saturation % 83 % 83 %    6 Minute Liters of Oxygen 3 L 3 L    2 Minute Post Oxygen Saturation % 94 % 94 %    2 Minute Post Liters of Oxygen 3 L 3 L       Initial Exercise Prescription:     Initial Exercise Prescription - 07/14/16 1300      Date of Initial Exercise RX and Referring Provider   Date 07/14/16   Referring Provider Ashby Dawes     Oxygen   Oxygen Continuous   Liters 3     Treadmill   MPH 1.5   Grade 0   Minutes 15  3/3/3/3   METs 2.15     REL-XR   Level 2   Minutes 15   METs 2     T5 Nustep   Level 1   Minutes 15   METs 2     Prescription Details   Frequency (times per week) 3   Duration Progress to 45 minutes of aerobic exercise without signs/symptoms of physical distress     Intensity   THRR 40-80% of Max Heartrate 104-129   Ratings of Perceived Exertion 11-13   Perceived Dyspnea 0-4     Progression   Progression Continue to progress workloads to maintain intensity without signs/symptoms of physical distress.     Resistance Training   Training Prescription Yes   Weight 2   Reps 10-12      Perform Capillary Blood Glucose checks as needed.  Exercise Prescription Changes:     Exercise Prescription Changes    Row Name 07/27/16 1200 07/30/16 1100 08/11/16 1500         Exercise Review   Progression  --  -- Yes       Response to Exercise   Blood Pressure (Admit) 114/60 126/56  --     Blood Pressure (Exercise) 130/80 124/64 144/70     Blood Pressure (Exit) 130/68 128/70 120/80     Heart Rate (Admit) 82 bpm 82 bpm  --     Heart Rate (Exercise) 102 bpm 97 bpm 95 bpm     Heart Rate (Exit) 90 bpm 70 bpm 90 bpm     Oxygen Saturation (Admit) 95 % 95 % 94 %     Oxygen Saturation (Exercise) 86 %  Sat dropped to 86 at RA on TM. W/ 2L of O2 Sat increased 91. 91 % 91 %     Oxygen Saturation (Exit) 93 % 93 % 89 %     Rating of Perceived Exertion (Exercise) '12 12 12     '$ Perceived Dyspnea (Exercise) 2 0 2        Progression   Progression  -- Continue to progress workloads to maintain intensity without signs/symptoms of physical distress. Continue to progress workloads to maintain intensity without signs/symptoms of physical distress.       Resistance Training   Training Prescription Yes Yes Yes     Weight '2 2 2     '$ Reps 10-12 10-12 10-12       Interval Training   Interval Training  -- No No       Oxygen   Oxygen Continuous Continuous Continuous     Liters 3 2  added om  treadmill only 2       Treadmill   MPH 1.5 1 1.1     Grade 0 0 0     Minutes 15  3/3/3/'3 15 15     '$ METs 2.15  --  --       NuStep   Level  -- 1 2     Minutes  -- 15 15       REL-XR   Level '2 2 2     '$ Minutes '15 15 15     '$ METs 2 1.7  --       T5 Nustep   Level 1  --  --     Minutes 15  --  --     METs 2  --  --        Exercise Comments:     Exercise Comments    Row Name 07/14/16 1312 07/27/16 1218 07/30/16 1147 08/11/16 1519     Exercise Comments Ms Lanter began the test on room air but droppped quickly too low to continue the test.  She completed the test on 3L continuous oxygen. First full day of exercise!  Patient was oriented to gym and equipment including functions, settings, policies, and procedures.  Patient's individual exercise prescription and treatment plan were reviewed.  All starting workloads were established based on the results of the 6 minute walk test done at initial orientation visit.  The plan for exercise progression was also introduced and progression will be customized based on patient's performance and goals. Ms Masseys 02 dropped to 10 on Tm so she has added 2L continuous oxygen on TM only.  O2 sats were 91 with oxygen. Collie Siad is progressing well with exerccise.       Discharge Exercise Prescription (Final Exercise Prescription Changes):     Exercise Prescription Changes - 08/11/16 1500      Exercise Review   Progression Yes     Response to Exercise   Blood Pressure (Exercise)  144/70   Blood Pressure (Exit) 120/80   Heart Rate (Exercise) 95 bpm   Heart Rate (Exit) 90 bpm   Oxygen Saturation (Admit) 94 %   Oxygen Saturation (Exercise) 91 %   Oxygen Saturation (Exit) 89 %   Rating of Perceived Exertion (Exercise) 12   Perceived Dyspnea (Exercise) 2     Progression   Progression Continue to progress workloads to maintain intensity without signs/symptoms of physical distress.     Resistance Training   Training Prescription Yes   Weight 2   Reps 10-12     Interval Training   Interval Training No     Oxygen   Oxygen Continuous   Liters 2     Treadmill   MPH 1.1   Grade 0   Minutes 15     NuStep   Level 2   Minutes 15     REL-XR   Level 2   Minutes 15       Nutrition:  Target Goals: Understanding of nutrition guidelines, daily intake of sodium '1500mg'$ , cholesterol '200mg'$ , calories 30% from fat and 7% or less from saturated fats, daily to have 5 or more servings of fruits and vegetables.  Biometrics:     Pre Biometrics - 07/14/16 1257      Pre Biometrics   Height 5' 2.5" (1.588 m)   Weight 147 lb 12.8 oz (67 kg)   Waist Circumference 33.5 inches   Hip Circumference 42.5 inches   Waist to Hip Ratio 0.79 %  BMI (Calculated) 26.7       Nutrition Therapy Plan and Nutrition Goals:   Nutrition Discharge: Rate Your Plate Scores:   Psychosocial: Target Goals: Acknowledge presence or absence of depression, maximize coping skills, provide positive support system. Participant is able to verbalize types and ability to use techniques and skills needed for reducing stress and depression.  Initial Review & Psychosocial Screening:     Initial Psych Review & Screening - 07/14/16 [place]? Yes   Comments Ms Granlund has good support from he husband of 7yr. She has faced cancer twice and now COPD, but she has a very positive outlook on life. One concern is her husband's early stages of Alzheimers and  his care. [place]     Barriers   Psychosocial barriers to participate in program The patient should benefit from training in stress management and relaxation.     Screening Interventions   Interventions Encouraged to exercise;Program counselor consult      Quality of Life Scores:     Quality of Life - 07/14/16 1307      Quality of Life Scores   Health/Function Pre 21 %   Socioeconomic Pre 21 %   Psych/Spiritual Pre 21 %   Family Pre 21 %   GLOBAL Pre 21 %      PHQ-9: Recent Review Flowsheet Data    [facility]   Decreased Interest 0   Down, Depressed, Hopeless 0   PHQ - 2 Score 0   Altered sleeping 0   Tired, decreased energy 1   Change in appetite 1   Feeling bad or failure about yourself  0   Trouble concentrating 0   Moving slowly or fidgety/restless 0   Suicidal thoughts 0   PHQ-9 Score 2   Difficult doing work/chores Not difficult at all      Psychosocial Evaluation and Intervention:     Psychosocial Evaluation - 07/27/16 1149      Psychosocial Evaluation & Interventions   Interventions Encouraged to exercise with the program and follow exercise prescription   Comments Counselor met with Ms. MBurnett Harry(Ms. MJerilynn Mages today for initial psychosocial evaluation.  She is a 80year old who has been diagnosed with COPD.  She has a strong support system with a spouse of over 423years; (2) adult children who live closeby; and a sister and niece locally as well.  Ms. MJerilynn Mageshas a healthy history of bladder cancer and [facility]  She also lost a kidney to cancer in 2000.  She reports sleeping well and having a good appetite.  She denies a history of depression or anxiety or any current symptoms. She states she is typically positive most of the time and has minimal stress in her life currently.  She has goals to walk better without being short of breath.  Staff will  follow with Ms. M throughout the course of this program.        Psychosocial Re-Evaluation:     Psychosocial Re-Evaluation    [place] 08/12/16 1029             Psychosocial Re-Evaluation   Comments Counselor follow up with Ms. M today reporting she is feeling stronger in general and has been breathing better overall; but was struggling some today with shortness of breath.  She continues to be in  a positive mood although she has some stress with her spouse's health declining.  She is unable to go away for Thanksgiving with family but she and her spouse are joining friends at a [employer] and she seemed fine with that option.  Ms. Jerilynn Mages states she is sleeping well and enjoying this program.  Staff will continue to follow with her.           Education: Education Goals: Education classes will be provided on a weekly basis, covering required topics. Participant will state understanding/return demonstration of topics presented.  Learning Barriers/Preferences:     Learning Barriers/Preferences - 07/14/16 1250      Learning Barriers/Preferences   Learning Barriers None   Learning Preferences None      Education Topics: Initial Evaluation Education: - Verbal, written and demonstration of respiratory meds, RPE/PD scales, oximetry and breathing techniques. Instruction on use of nebulizers and MDIs: cleaning and proper use, rinsing mouth with steroid doses and importance of monitoring MDI activations. Flowsheet Row Pulmonary Rehab from 08/10/2016 in [facility] Cardiac and Pulmonary Rehab  Date  07/14/16  Educator  LB  Instruction Review Code  2- meets goals/outcomes      General Nutrition Guidelines/Fats and Fiber: -Group instruction provided by verbal, written material, models and posters to present the general guidelines for heart healthy nutrition. Gives an explanation and review of dietary fats and fiber. Flowsheet Row Pulmonary Rehab from 08/10/2016 in [facility] Cardiac and Pulmonary  Rehab  Date  08/10/16  Educator  CR  Instruction Review Code  2- meets goals/outcomes      Controlling Sodium/Reading Food Labels: -Group verbal and written material supporting the discussion of sodium use in heart healthy nutrition. Review and explanation with models, verbal and written materials for utilization of the food label.   Exercise Physiology & Risk Factors: - Group verbal and written instruction with models to review the exercise physiology of the cardiovascular system and associated critical values. Details cardiovascular disease risk factors and the goals associated with each risk factor. Flowsheet Row Pulmonary Rehab from 08/10/2016 in [facility] Cardiac and Pulmonary Rehab  Date  08/05/16  Educator  AS  Instruction Review Code  2- meets goals/outcomes      Aerobic Exercise & Resistance Training: - Gives group verbal and written discussion on the health impact of inactivity. On the components of aerobic and resistive training programs and the benefits of this training and how to safely progress through these programs.   Flexibility, Balance, General Exercise Guidelines: - Provides group verbal and written instruction on the benefits of flexibility and balance training programs. Provides general exercise guidelines with specific guidelines to those with heart or lung disease. Demonstration and skill practice provided.   Stress Management: - Provides group verbal and written instruction about the health risks of elevated stress, cause of high stress, and healthy ways to reduce stress.   Depression: - Provides group verbal and written instruction on the correlation between heart/lung disease and depressed mood, treatment options, and the stigmas associated with seeking treatment.   Exercise & Equipment Safety: - Individual verbal instruction and demonstration of equipment use and safety with use of the equipment. Flowsheet Row Pulmonary Rehab from 08/10/2016 in [facility]  Cardiac and Pulmonary Rehab  Date  07/27/16  Educator  LB  Instruction Review Code  2- meets goals/outcomes      Infection Prevention: - Provides verbal and written material to individual with discussion of infection control including proper hand washing and proper equipment cleaning during exercise  session. Flowsheet Row Pulmonary Rehab from 08/10/2016 in [facility] Cardiac and Pulmonary Rehab  Date  07/27/16  Educator  LB  Instruction Review Code  2- meets goals/outcomes      Falls Prevention: - Provides verbal and written material to individual with discussion of falls prevention and safety. Flowsheet Row Pulmonary Rehab from 08/10/2016 in [facility] Cardiac and Pulmonary Rehab  Date  07/14/16  Educator  LB  Instruction Review Code  2- meets goals/outcomes      Diabetes: - Individual verbal and written instruction to review signs/symptoms of diabetes, desired ranges of glucose level fasting, after meals and with exercise. Advice that pre and post exercise glucose checks will be done for 3 sessions at entry of program.   Chronic Lung Diseases: - Group verbal and written instruction to review new updates, new respiratory medications, new advancements in procedures and treatments. Provide informative websites and "800" numbers of self-education.   Lung Procedures: - Group verbal and written instruction to describe testing methods done to diagnose lung disease. Review the outcome of test results. Describe the treatment choices: Pulmonary Function Tests, ABGs and oximetry.   Energy Conservation: - Provide group verbal and written instruction for methods to conserve energy, plan and organize activities. Instruct on pacing techniques, use of adaptive equipment and posture/positioning to relieve shortness of breath.   Triggers: - Group verbal and written instruction to review types of environmental controls: home humidity, furnaces, filters, dust mite/pet prevention, HEPA vacuums. To discuss  weather changes, air quality and the benefits of nasal washing.   Exacerbations: - Group verbal and written instruction to provide: warning signs, infection symptoms, calling MD promptly, preventive modes, and value of vaccinations. Review: effective airway clearance, coughing and/or vibration techniques. Create an Sport and exercise psychologist.   Oxygen: - Individual and group verbal and written instruction on oxygen therapy. Includes supplement oxygen, available portable oxygen systems, continuous and intermittent flow rates, oxygen safety, concentrators, and Medicare reimbursement for oxygen. Flowsheet Row Pulmonary Rehab from 08/10/2016 in [facility] Cardiac and Pulmonary Rehab  Date  07/14/16  Educator  LB  Instruction Review Code  2- meets goals/outcomes      Respiratory Medications: - Group verbal and written instruction to review medications for lung disease. Drug class, frequency, complications, importance of spacers, rinsing mouth after steroid MDI's, and proper cleaning methods for nebulizers. Flowsheet Row Pulmonary Rehab from 08/10/2016 in [facility] Cardiac and Pulmonary Rehab  Date  07/14/16  Educator  LB  Instruction Review Code  2- meets goals/outcomes      AED/CPR: - Group verbal and written instruction with the use of models to demonstrate the basic use of the AED with the basic ABC's of resuscitation. Flowsheet Row Pulmonary Rehab from 08/10/2016 in [facility] Cardiac and Pulmonary Rehab  Date  07/31/16  Educator  C. EnterkinRN  Instruction Review Code  2- meets goals/outcomes      Breathing Retraining: - Provides individuals verbal and written instruction on purpose, frequency, and proper technique of diaphragmatic breathing and pursed-lipped breathing. Applies individual practice skills. Flowsheet Row Pulmonary Rehab from 08/10/2016 in [facility] Cardiac and Pulmonary Rehab  Date  07/27/16  Educator  LB  Instruction Review Code  2- meets goals/outcomes      Anatomy and Physiology of the  Lungs: - Group verbal and written instruction with the use of models to provide basic lung anatomy and physiology related to function, structure and complications of lung disease.   Heart Failure: - Group verbal and written instruction on the basics of heart failure:  signs/symptoms, treatments, explanation of ejection fraction, enlarged heart and cardiomyopathy.   Sleep Apnea: - Individual verbal and written instruction to review Obstructive Sleep Apnea. Review of risk factors, methods for diagnosing and types of masks and machines for OSA.   Anxiety: - Provides group, verbal and written instruction on the correlation between heart/lung disease and anxiety, treatment options, and management of anxiety.   Relaxation: - Provides group, verbal and written instruction about the benefits of relaxation for patients with heart/lung disease. Also provides patients with examples of relaxation techniques.   Knowledge Questionnaire Score:     Knowledge Questionnaire Score - 07/14/16 1250      Knowledge Questionnaire Score   Pre Score 8/10       Core Components/Risk Factors/Patient Goals at Admission:     Personal Goals and Risk Factors at Admission - 07/14/16 1257      Core Components/Risk Factors/Patient Goals on Admission   Sedentary Yes  Likes to walk and camp   Intervention Provide advice, education, support and counseling about physical activity/exercise needs.;Develop an individualized exercise prescription for aerobic and resistive training based on initial evaluation findings, risk stratification, comorbidities and participant's personal goals.   Expected Outcomes Achievement of increased cardiorespiratory fitness and enhanced flexibility, muscular endurance and strength shown through measurements of functional capacity and personal statement of participant.   Increase Strength and Stamina Yes   Intervention Provide advice, education, support and counseling about physical  activity/exercise needs.;Develop an individualized exercise prescription for aerobic and resistive training based on initial evaluation findings, risk stratification, comorbidities and participant's personal goals.   Expected Outcomes Achievement of increased cardiorespiratory fitness and enhanced flexibility, muscular endurance and strength shown through measurements of functional capacity and personal statement of participant.   Improve shortness of breath with ADL's Yes   Intervention Provide education, individualized exercise plan and daily activity instruction to help decrease symptoms of SOB with activities of daily living.   Expected Outcomes Short Term: Achieves a reduction of symptoms when performing activities of daily living.   Develop more efficient breathing techniques such as purse lipped breathing and diaphragmatic breathing; and practicing self-pacing with activity Yes   Intervention Provide education, demonstration and support about specific breathing techniuqes utilized for more efficient breathing. Include techniques such as pursed lipped breathing, diaphragmatic breathing and self-pacing activity.   Expected Outcomes Short Term: Participant will be able to demonstrate and use breathing techniques as needed throughout daily activities.   Increase knowledge of respiratory medications and ability to use respiratory devices properly  Yes  Advair, Combivent Respimat, SVN Albuterol; oxygen 2l/m at night and as needed   Intervention Provide education and demonstration as needed of appropriate use of medications, inhalers, and oxygen therapy.   Expected Outcomes Short Term: Achieves understanding of medications use. Understands that oxygen is a medication prescribed by physician. Demonstrates appropriate use of inhaler and oxygen therapy.   Hypertension Yes   Intervention Provide education on lifestyle modifcations including regular physical activity/exercise, weight management, moderate  sodium restriction and increased consumption of fresh fruit, vegetables, and low fat dairy, alcohol moderation, and smoking cessation.;Monitor prescription use compliance.   Expected Outcomes Short Term: Continued assessment and intervention until BP is < 140/88m HG in hypertensive participants. < 130/877mHG in hypertensive participants with diabetes, heart failure or chronic kidney disease.;Long Term: Maintenance of blood pressure at goal levels.      Core Components/Risk Factors/Patient Goals Review:      Goals and Risk Factor Review    Row Name 07/27/16  1207 08/10/16 0849           Core Components/Risk Factors/Patient Goals Review   Personal Goals Review Develop more efficient breathing techniques such as purse lipped breathing and diaphragmatic breathing and practicing self-pacing with activity. Sedentary;Increase Strength and Stamina;Improve shortness of breath with ADL's;Increase knowledge of respiratory medications and ability to use respiratory devices properly.;Develop more efficient breathing techniques such as purse lipped breathing and diaphragmatic breathing and practicing self-pacing with activity.;Hypertension      Review Pursed lip breathing techniques were explained and discussed with the patient. The patient demonstrated understanding of this breathing technique and when to use it during exercise and ADL's.  Ms Lubrano has increased her exercise goals in [place]. She has oxygen at home for sleep, but she has not used it during the day. Walking on the treadmill has been difficult for Ms Altidor with O2Sat's in the mid 80's, so we have added 2-3l/m of oxygen for O2Sat's in the low 90's. Shopping and some housework make tired, so I have encouraged her to check her O2Sat's to check if they are low and if she should be wearing oxyge, pacing.Ms Trager has a good understaning of her MDI's and has acceptable BP readings in [facility].      Expected Outcomes Patient will use PLB during  exercise and ADL's to help improve oxygen saturations and control SOB.   --         Core Components/Risk Factors/Patient Goals at Discharge (Final Review):      Goals and Risk Factor Review - 08/10/16 0849      Core Components/Risk Factors/Patient Goals Review   Personal Goals Review Sedentary;Increase Strength and Stamina;Improve shortness of breath with ADL's;Increase knowledge of respiratory medications and ability to use respiratory devices properly.;Develop more efficient breathing techniques such as purse lipped breathing and diaphragmatic breathing and practicing self-pacing with activity.;Hypertension   Review Ms Drotar has increased her exercise goals in [place]. She has oxygen at home for sleep, but she has not used it during the day. Walking on the treadmill has been difficult for Ms Zenz with O2Sat's in the mid 80's, so we have added 2-3l/m of oxygen for O2Sat's in the low 90's. Shopping and some housework make tired, so I have encouraged her to check her O2Sat's to check if they are low and if she should be wearing oxyge, pacing.Ms Mausolf has a good understaning of her MDI's and has acceptable BP readings in [facility].      ITP Comments:   Comments: 30 day note review

## 2016-08-19 DIAGNOSIS — J438 Other emphysema: Secondary | ICD-10-CM | POA: Diagnosis not present

## 2016-08-19 DIAGNOSIS — J439 Emphysema, unspecified: Secondary | ICD-10-CM

## 2016-08-19 NOTE — Progress Notes (Signed)
Daily Session Note  Patient Details  Name: Jobina Maita MRN: [id number] Date of Birth: [birth year] Referring Provider:   Flowsheet Row Pulmonary Rehab from 07/14/2016 in [facility] Cardiac and Pulmonary Rehab  Referring Provider  Ramachandran      Encounter Date: 08/19/2016  Check In:     Session Check In - 08/19/16 1153      Check-In   Location [facility]-Cardiac & Pulmonary Rehab   Staff Present Carson Myrtle, BS, RRT, Respiratory Therapist;Carroll Enterkin, RN, Vickki Hearing, BA, ACSM CEP, Exercise Physiologist   Supervising physician immediately available to respond to emergencies [facility] immediately available ER MD   Physician(s) Jimmye Norman and Reita Cliche   Medication changes reported     No   Fall or balance concerns reported    No   Warm-up and Cool-down Performed as group-led Location manager Performed Yes   VAD Patient? No     Pain Assessment   Currently in Pain? No/denies   Multiple Pain Sites No         Goals Met:  Proper associated with RPD/PD & O2 Sat Independence with exercise equipment Exercise tolerated well Strength training completed today  Goals Unmet:  Not Applicable  Comments: Pt able to follow exercise prescription today without complaint.  Will continue to monitor for progression.    Dr. Emily Filbert is Medical Director for [place] and [facility].

## 2016-08-21 ENCOUNTER — Encounter: Payer: Medicare Other | Attending: Internal Medicine | Admitting: *Deleted

## 2016-08-21 ENCOUNTER — Telehealth: Payer: Self-pay | Admitting: Internal Medicine

## 2016-08-21 DIAGNOSIS — J438 Other emphysema: Secondary | ICD-10-CM | POA: Diagnosis not present

## 2016-08-21 DIAGNOSIS — J449 Chronic obstructive pulmonary disease, unspecified: Secondary | ICD-10-CM | POA: Diagnosis not present

## 2016-08-21 DIAGNOSIS — J439 Emphysema, unspecified: Secondary | ICD-10-CM

## 2016-08-21 MED ORDER — AZITHROMYCIN 250 MG PO TABS: ORAL_TABLET | ORAL | 0 refills | Status: AC

## 2016-08-21 MED ORDER — PREDNISONE 10 MG (21) PO TBPK: ORAL_TABLET | ORAL | 0 refills | Status: DC

## 2016-08-21 NOTE — Telephone Encounter (Signed)
Please advise on your pt from message below if you want her brought in or meds ordered. Thanks.

## 2016-08-21 NOTE — Telephone Encounter (Signed)
Pt informed RXs sent to pharmacy. nothing further needed.

## 2016-08-21 NOTE — Progress Notes (Signed)
Daily Session Note  Patient Details  Name: Catherine Landry MRN: [id number] Date of Birth: May 24, 1936 Referring Provider:   April Manson Pulmonary Rehab from 07/14/2016 in [facility] Cardiac and Pulmonary Rehab  Referring Provider  Ramachandran      Encounter Date: 08/21/2016  Check In:     Session Check In - 08/21/16 1023      Check-In   Location [facility]-Cardiac & Pulmonary Rehab   Staff Present Gerlene Burdock, RN, Vickki Hearing, BA, ACSM CEP, Exercise Physiologist;Other  Levell July, RN BSN   Supervising physician immediately available to respond to emergencies [facility] immediately available ER MD   Physician(s) Drs. Alfred Levins and [employer]   Medication changes reported     No   Fall or balance concerns reported    No   Warm-up and Cool-down Performed as group-led Location manager Performed Yes   VAD Patient? No     Pain Assessment   Currently in Pain? No/denies   Multiple Pain Sites No         Goals Met:  Proper associated with RPD/PD & O2 Sat Independence with exercise equipment Using PLB without cueing & demonstrates good technique Exercise tolerated well No report of cardiac concerns or symptoms Strength training completed today  Goals Unmet:  Not Applicable  Comments: Pt able to follow exercise prescription today without complaint.  Will continue to monitor for progression.    Dr. Emily Filbert is Medical Director for [place] and [facility].

## 2016-08-21 NOTE — Telephone Encounter (Signed)
Pt states she woke up wheezing in the night, has been coughing up yellow, green stuff. She thinks she needs prednisone and a Zpak. Please call. States it is ok to speak to her husband. She may be at therapy

## 2016-08-21 NOTE — Telephone Encounter (Signed)
--  Zpack.   --Prednisone 10 mg tabs x 21.  Take 6 tablets on day 1 Take 5 tablets on day 2 Take 4 tablets on day 3 Take 3 tablets on day 4 Take 2 tablets on day 5 Take 1 tablet on day 6 then stop.

## 2016-08-24 ENCOUNTER — Encounter: Payer: Medicare Other | Admitting: *Deleted

## 2016-08-24 DIAGNOSIS — J438 Other emphysema: Secondary | ICD-10-CM | POA: Diagnosis not present

## 2016-08-24 DIAGNOSIS — J439 Emphysema, unspecified: Secondary | ICD-10-CM

## 2016-08-24 NOTE — Progress Notes (Signed)
Daily Session Note  Patient Details  Name: Catherine Landry MRN: [id number] Date of Birth: 25-Oct-1935 Referring Provider:   Flowsheet Row Pulmonary Rehab from 07/14/2016 in [facility] Cardiac and Pulmonary Rehab  Referring Provider  Ramachandran      Encounter Date: 08/24/2016  Check In:     Session Check In - 08/24/16 1139      Check-In   Location [facility]-Cardiac & Pulmonary Rehab   Staff Present Earlean Shawl, BS, ACSM CEP, Exercise Physiologist;Amanda Oletta Darter, BA, ACSM CEP, Exercise Physiologist;Laureen Janell Quiet, RRT, Respiratory Therapist   Supervising physician immediately available to respond to emergencies [facility] immediately available ER MD   Physician(s) Jimmye Norman and Mariea Clonts   Medication changes reported     No   Fall or balance concerns reported    No   Warm-up and Cool-down Performed on first and last piece of equipment   Resistance Training Performed Yes   VAD Patient? No     Pain Assessment   Currently in Pain? No/denies   Multiple Pain Sites No         Goals Met:  Proper associated with RPD/PD & O2 Sat Independence with exercise equipment Exercise tolerated well Strength training completed today  Goals Unmet:  Not Applicable  Comments: Pt able to follow exercise prescription today without complaint.  Will continue to monitor for progression.    Dr. Emily Filbert is Medical Director for [place] and [facility].

## 2016-08-26 DIAGNOSIS — J439 Emphysema, unspecified: Secondary | ICD-10-CM

## 2016-08-26 DIAGNOSIS — J438 Other emphysema: Secondary | ICD-10-CM | POA: Diagnosis not present

## 2016-08-26 NOTE — Progress Notes (Signed)
Daily Session Note  Patient Details  Name: Catherine Landry MRN: [id number] Date of Birth: Mar 16, 1936 Referring Provider:   Flowsheet Row Pulmonary Rehab from 07/14/2016 in [facility] Cardiac and Pulmonary Rehab  Referring Provider  Ramachandran      Encounter Date: 08/26/2016  Check In:     Session Check In - 08/26/16 1306      Check-In   Location [facility]-Cardiac & Pulmonary Rehab   Staff Present Carson Myrtle, BS, RRT, Respiratory Lennie Hummer, MA, ACSM RCEP, Exercise Physiologist;Amanda Oletta Darter, BA, ACSM CEP, Exercise Physiologist   Supervising physician immediately available to respond to emergencies [facility] immediately available ER MD   Physician(s) Burlene Arnt and Schaevitz   Medication changes reported     No   Fall or balance concerns reported    No   Warm-up and Cool-down Performed as group-led instruction   Resistance Training Performed Yes   VAD Patient? No     Pain Assessment   Currently in Pain? No/denies   Multiple Pain Sites No         Goals Met:  Proper associated with RPD/PD & O2 Sat Independence with exercise equipment Exercise tolerated well Strength training completed today  Goals Unmet:  Not Applicable  Comments: Pt able to follow exercise prescription today without complaint.  Will continue to monitor for progression.    Dr. Emily Filbert is Medical Director for [place] and [facility].

## 2016-08-31 ENCOUNTER — Encounter: Payer: Medicare Other | Admitting: *Deleted

## 2016-08-31 DIAGNOSIS — J438 Other emphysema: Secondary | ICD-10-CM | POA: Diagnosis not present

## 2016-08-31 DIAGNOSIS — J439 Emphysema, unspecified: Secondary | ICD-10-CM

## 2016-08-31 NOTE — Progress Notes (Addendum)
Daily Session Note  Patient Details  Name: Catherine Landry MRN: [id number] Date of Birth: [birth year] Referring Provider:   Flowsheet Row Pulmonary Rehab from 07/14/2016 in [facility] Cardiac and Pulmonary Rehab  Referring Provider  Ramachandran      Encounter Date: 08/31/2016  Check In:     Session Check In - 08/31/16 1017      Check-In   Location [facility]-Cardiac & Pulmonary Rehab   Staff Present Carson Myrtle, BS, RRT, Respiratory Therapist;Mitchelle Goerner Amedeo Plenty, BS, ACSM CEP, Exercise Physiologist;Amanda Oletta Darter, BA, ACSM CEP, Exercise Physiologist   Supervising physician immediately available to respond to emergencies [facility] immediately available ER MD   Physician(s) Clearnce Hasten and Joni Fears   Medication changes reported     No   Fall or balance concerns reported    No   Warm-up and Cool-down Performed on first and last piece of equipment   Resistance Training Performed Yes     Pain Assessment   Currently in Pain? No/denies   Multiple Pain Sites No         Goals Met:  Proper associated with RPD/PD & O2 Sat Independence with exercise equipment Exercise tolerated well Strength training completed today  Goals Unmet:  Not Applicable  Comments: Pt able to follow exercise prescription today without complaint.  Will continue to monitor for progression. [name] did meet with Dr Emily Filbert for her face-to-face encounter.     Dr. Emily Filbert is Medical Director for [place] and [facility].

## 2016-09-02 DIAGNOSIS — J439 Emphysema, unspecified: Secondary | ICD-10-CM

## 2016-09-02 DIAGNOSIS — J438 Other emphysema: Secondary | ICD-10-CM | POA: Diagnosis not present

## 2016-09-02 NOTE — Progress Notes (Signed)
Daily Session Note  Patient Details  Name: Catherine Landry MRN: [id number] Date of Birth: [birth year] Referring Provider:   April Manson Pulmonary Rehab from 07/14/2016 in [facility] Cardiac and Pulmonary Rehab  Referring Provider  Ramachandran      Encounter Date: 09/02/2016  Check In:     Session Check In - 09/02/16 1019      Check-In   Location [facility]-Cardiac & Pulmonary Rehab   Staff Present Alberteen Sam, MA, ACSM RCEP, Exercise Physiologist;Lashante Fryberger Oletta Darter, BA, ACSM CEP, Exercise Physiologist;Laureen Owens Shark, BS, RRT, Respiratory Therapist   Supervising physician immediately available to respond to emergencies [facility] immediately available ER MD   Physician(s) Drs. Marcelene Butte and McShane   Medication changes reported     No   Fall or balance concerns reported    No   Warm-up and Cool-down Performed on first and last piece of equipment   Resistance Training Performed Yes   VAD Patient? No     Pain Assessment   Currently in Pain? No/denies   Multiple Pain Sites No         Goals Met:  Proper associated with RPD/PD & O2 Sat Independence with exercise equipment Exercise tolerated well Strength training completed today  Goals Unmet:  Not Applicable  Comments: Reviewed home exercise with pt today.  Pt plans to walk/join [employer] for exercise.  Reviewed THR, pulse, RPE, sign and symptoms, NTG use, and when to call 911 or MD.  Also discussed weather considerations and indoor options.  Pt voiced understanding.    Dr. Emily Filbert is Medical Director for [place] and [facility].

## 2016-09-02 NOTE — Progress Notes (Signed)
[place] [facility] Consultation      Assessment and Plan:  The patient is a 80 year old female with a history of invasive bladder cancer, emphysema, lung nodule, being followed up for COPD and lung nodule.   Lung Nodule. -CT chest in April 2016 Showed 0.7 cmm lung nodule in superior segment of Rt lower lobe  -Repeat scan at [facility] continues to show nodules, on 07/16/16, stable from 07/11/15  Emphysema.  -Chronic severe emphysema, will repeat PFT and 6MWT before next visit.  -Continue advair, theophylline, combivent, and prn albuterol nebs. Continue pulmonary rehab.  --Was given a prescription for prednisone taper to use if she has a flare up.    Dyspnea. --She is already maximally treated with inhaled medications and continues to experience continued decline in her breathing.  --Will refer to pulmonary rehab, discussed the importance of it with her.   Chronic hypoxic respiratory failure.  -Nocturnal hypoxia, continue oxygen at 2L qhs.    Bladder cancer. -We will need to follow her lung nodule, given risk for metastatic disease.  Date: 09/02/2016  MRN# [id number] Catherine Landry 08/24/1936  Referring Physician: Dr. Ginette Pitman.   Catherine Landry is a 80 y.o. old female seen in consultation for chief complaint of:    Chief Complaint  Patient presents with  . Follow-up    per [facility]. pt states breathing is doing well. pt c/o occ mild wheezing. pt wears 2L qhs.     HPI:  The patient is a 80 year old female with a history of COPD. She is currently on Advair 250/50, theophylline, Combivent Respimat. She has had 3 exacerbations in the latter part of 2017 that required treatment but did not lead to hospitalization. She has been referred to pulm rehab at last visit and she notes that his has helped greatly. She otherwise she feels that her breathing is doing ok.  She has been using oxygen at night at 2L, she does not use a CPAP. She is using oxygen while in pulm rehab. She  required oxygen at 2L in therapy.   Repeat CT chest reviewed; from 07/16/16; unchanged from previous.  She tried stopping the theopylline but notes that her breathing was worse when off of it.    She has a history of bladder cancer, chronic kidney disease, GERD, invasive bladder cancer, and left  nephroureterectomy in 2002. For her last note with oncology in October 2016, her restaging CT was negative for recurrent or metastatic disease.  CT chest 01/16/16: at [facility] and compared with previous from 07/11/15 Impression: 1. Several scattered reticular and nodular opacities are new from prior, favored to be infectious/inflammatory in etiology, although at least one nodule is solitary and could conceivably represent a neoplasm in the background setting of emphysema. Recommend 3 month follow-up CT.  CT chest in April 2016 Showed 0.7 cmm lung nodule in superior segment of Rt lower lobe  CT chest images and report from 10/10/2015 and compared with previous from from 10/06/2015. There appeared to be a small right upper lobe nodule, as well as a small area of scarring/pneumonia which appeared to have resolved and the subsequent CT, although the nodule is still present. CT shows significant bilateral emphysema. There is also streaky atelectasis seen in the left base.   Medication:   Reviewed.     Allergies:  Patient has no known allergies.  Review of Systems: Gen:  Denies  fever, sweats, chills HEENT: Denies blurred vision, double vision.  Cvc:  No dizziness, chest pain. Resp:  Denies cough  Gi: Denies swallowing difficulty, stomach pain. Gu:  Denies bladder incontinence, burning urine Ext:   No Joint pain, stiffness. Skin: No skin rash,  hives Endoc:  No polyuria, polydipsia. Psych: No depression, insomnia. Other:  All other systems were reviewed with the patient and were negative other that what is mentioned in the HPI.   Physical Examination:   VS: There were no vitals taken for this  visit. on room air. General Appearance: No distress  Neuro:without focal findings,  speech normal,  HEENT: PERRLA, EOM intact.   Pulmonary: normal breath sounds,  Decreased air entry bilaterally. No wheezing.  CardiovascularNormal S1,S2.  No m/r/g.   Abdomen: Benign, Soft, non-tender. Renal:  No costovertebral tenderness  GU:  No performed at this time. Endoc: No evident thyromegaly, no signs of acromegaly. Skin:   warm, no rashes, no ecchymosis  Extremities: normal, no cyanosis, clubbing.  Other findings:    LABORATORY PANEL:   CBC No results for input(s): WBC, HGB, HCT, PLT in the last 168 hours. ------------------------------------------------------------------------------------------------------------------  Chemistries  No results for input(s): NA, K, CL, CO2, GLUCOSE, BUN, CREATININE, CALCIUM, MG, AST, ALT, ALKPHOS, BILITOT in the last 168 hours.  Invalid input(s): GFRCGP ------------------------------------------------------------------------------------------------------------------  Cardiac Enzymes No results for input(s): TROPONINI in the last 168 hours. ------------------------------------------------------------  RADIOLOGY:  No results found.     Thank  you for the consultation and for allowing [facility] to assist in the care of your patient. Our recommendations are noted above.  Please contact us if we can be of further service.   Marda Stalker, MD.  Board Certified in Internal Medicine, Pulmonary Medicine, [place], and Sleep Medicine.  [facility]  Patricia Pesa, M.D.  Vilinda Boehringer, M.D.  Merton Border, M.D

## 2016-09-03 ENCOUNTER — Ambulatory Visit (INDEPENDENT_AMBULATORY_CARE_PROVIDER_SITE_OTHER): Payer: Medicare Other | Admitting: Internal Medicine

## 2016-09-03 ENCOUNTER — Encounter: Payer: Self-pay | Admitting: Internal Medicine

## 2016-09-03 ENCOUNTER — Other Ambulatory Visit: Payer: Self-pay | Admitting: Gastroenterology

## 2016-09-03 VITALS — BP 120/60 | HR 75 | Ht 64.0 in | Wt 146.6 lb

## 2016-09-03 DIAGNOSIS — J449 Chronic obstructive pulmonary disease, unspecified: Secondary | ICD-10-CM

## 2016-09-03 DIAGNOSIS — R911 Solitary pulmonary nodule: Secondary | ICD-10-CM | POA: Diagnosis not present

## 2016-09-03 DIAGNOSIS — R1319 Other dysphagia: Secondary | ICD-10-CM

## 2016-09-03 MED ORDER — PREDNISONE 10 MG PO TABS: ORAL_TABLET | ORAL | 0 refills | Status: DC

## 2016-09-03 NOTE — Patient Instructions (Addendum)
--  Continue oxygen.   --Continue pulmonary rehab.   --No need for further repeat CT scans.   --PFT in 6 months and follow up at that time.   Prednisone 10 mg tabs x 21.  Take 6 tablets on day 1 Take 5 tablets on day 2 Take 4 tablets on day 3 Take 3 tablets on day 4 Take 2 tablets on day 5 Take 1 tablet on day 6 then stop. -- Inform us if you start this medication and advise [place] of your progress once you complete it.

## 2016-09-04 DIAGNOSIS — J438 Other emphysema: Secondary | ICD-10-CM | POA: Diagnosis not present

## 2016-09-04 DIAGNOSIS — J439 Emphysema, unspecified: Secondary | ICD-10-CM

## 2016-09-04 NOTE — Progress Notes (Signed)
Daily Session Note  Patient Details  Name: Catherine Landry MRN: [id number] Date of Birth: [birth year] Referring Provider:   April Manson Pulmonary Rehab from 07/14/2016 in [facility] Cardiac and Pulmonary Rehab  Referring Provider  Ramachandran      Encounter Date: 09/04/2016  Check In:     Session Check In - 09/04/16 1152      Check-In   Location [facility]-Cardiac & Pulmonary Rehab   Staff Present Nyoka Cowden, RN, BSN, Kela Millin, BA, ACSM CEP, Exercise Physiologist;Other  Jena Gauss RN   Supervising physician immediately available to respond to emergencies [facility] immediately available ER MD   Physician(s) Jimmye Norman and Alfred Levins   Medication changes reported     No   Fall or balance concerns reported    No   Warm-up and Cool-down Performed as group-led Location manager Performed Yes   VAD Patient? No     Pain Assessment   Currently in Pain? No/denies   Multiple Pain Sites No         Goals Met:  Proper associated with RPD/PD & O2 Sat Independence with exercise equipment Exercise tolerated well Strength training completed today  Goals Unmet:  Not Applicable  Comments: Pt able to follow exercise prescription today without complaint.  Will continue to monitor for progression.    Dr. Emily Filbert is Medical Director for [place] and [facility].

## 2016-09-07 ENCOUNTER — Encounter: Payer: Self-pay | Admitting: Respiratory Therapy

## 2016-09-07 ENCOUNTER — Encounter: Payer: Medicare Other | Admitting: *Deleted

## 2016-09-07 DIAGNOSIS — J439 Emphysema, unspecified: Secondary | ICD-10-CM

## 2016-09-07 DIAGNOSIS — J438 Other emphysema: Secondary | ICD-10-CM | POA: Diagnosis not present

## 2016-09-07 NOTE — Progress Notes (Signed)
Daily Session Note  Patient Details  Name: Catherine Landry MRN: [id number] Date of Birth: [birth year] Referring Provider:   Flowsheet Row Pulmonary Rehab from 07/14/2016 in [facility] Cardiac and Pulmonary Rehab  Referring Provider  Ramachandran      Encounter Date: 09/07/2016  Check In:     Session Check In - 09/07/16 1039      Check-In   Location [facility]-Cardiac & Pulmonary Rehab   Staff Present Carson Myrtle, BS, RRT, Respiratory Therapist;Ean Gettel Amedeo Plenty, BS, ACSM CEP, Exercise Physiologist;Amanda Oletta Darter, BA, ACSM CEP, Exercise Physiologist;Mary Kellie Shropshire, RN, BSN, MA   Supervising physician immediately available to respond to emergencies [facility] immediately available ER MD   Physician(s) Dominic Pea and Paduchowski   Medication changes reported     No   Fall or balance concerns reported    No   Warm-up and Cool-down Performed on first and last piece of equipment   Resistance Training Performed Yes   VAD Patient? No     Pain Assessment   Currently in Pain? No/denies   Multiple Pain Sites No         Goals Met:  Proper associated with RPD/PD & O2 Sat Independence with exercise equipment Exercise tolerated well Strength training completed today  Goals Unmet:  Not Applicable  Comments: Pt able to follow exercise prescription today without complaint.  Will continue to monitor for progression.    Dr. Emily Filbert is Medical Director for [place] and [facility].

## 2016-09-07 NOTE — Progress Notes (Signed)
Pulmonary Individual Treatment Plan  Patient Details  Name: Catherine Landry MRN: [id number] Date of Birth: Sep 14, 1936 Referring Provider:   Flowsheet Row Pulmonary Rehab from 07/14/2016 in [facility] Cardiac and Pulmonary Rehab  Referring Provider  Ramachandran      Initial Encounter Date:  Flowsheet Row Pulmonary Rehab from 07/14/2016 in [facility] Cardiac and Pulmonary Rehab  Date  07/14/16  Referring Provider  Ashby Dawes      Visit Diagnosis: Pulmonary emphysema, unspecified emphysema type ([place])  Patient's Home Medications on Admission:  Current Outpatient Prescriptions:    ADVAIR DISKUS 250-50 MCG/DOSE AEPB, Inhale 1 puff into the lungs 2 (two) times daily. *Rinse mouth after use*, Disp: , Rfl: 1   albuterol (PROVENTIL) (2.5 MG/3ML) 0.083% nebulizer solution, Take 3-6 mLs by nebulization every 6 (six) hours as needed. For shortness of breath/wheezing., Disp: , Rfl: 5   aspirin EC 81 MG tablet, Take 81 mg by mouth every morning., Disp: , Rfl:    bisacodyl (DULCOLAX) 5 MG EC tablet, Take 1 tablet (5 mg total) by mouth daily as needed for moderate constipation., Disp: 30 tablet, Rfl: 0   Calcium-Magnesium-Vitamin D (CALCIUM 1200+D3 PO), Take 1 tablet by mouth every morning., Disp: , Rfl:    COMBIVENT RESPIMAT 20-100 MCG/ACT AERS respimat, Inhale 1 puff into the lungs 4 (four) times daily as needed. For COPD., Disp: , Rfl: 5   dicyclomine (BENTYL) 10 MG capsule, Take 10 mg by mouth 2 (two) times daily as needed for muscle spasms. For up to 10 days, Disp: , Rfl:    losartan (COZAAR) 50 MG tablet, Take 50 mg by mouth every morning., Disp: , Rfl:    metoprolol succinate (TOPROL-XL) 25 MG 24 hr tablet, Take 25 mg by mouth every morning., Disp: , Rfl:    Multiple Vitamin (MULTIVITAMIN WITH MINERALS) TABS, Take 1 tablet by mouth every morning., Disp: , Rfl:    NEOMYCIN-POLYMYXIN-HYDROCORTISONE (CORTISPORIN) 1 % SOLN otic solution, Apply 1-2 drops to toe BID after soaking, Disp: 10 mL,  Rfl: 1   omeprazole (PRILOSEC) 40 MG capsule, Take 40 mg by mouth 3 (three) times a week. , Disp: , Rfl: 5   polyethylene glycol powder (GLYCOLAX/MIRALAX) powder, Take 17 g by mouth daily as needed for constipation. Mix with 4 to 8 ounces of fluid., Disp: , Rfl:    predniSONE (DELTASONE) 10 MG tablet, Start with 61m daily and decrease by 167mdaily, Disp: 21 tablet, Rfl: 0   predniSONE (STERAPRED UNI-PAK 21 TAB) 10 MG (21) TBPK tablet, Take 6 tablets on Day 1, then 5-4-3-2-1 until complete, Disp: 21 tablet, Rfl: 0   theophylline (THEO-24) 300 MG 24 hr capsule, Take 1 capsule (300 mg total) by mouth every morning., Disp: 30 capsule, Rfl: 0  Past Medical History: Past Medical History:  Diagnosis Date   [place]hma    [place]   [place]    Hypertension    Renal insufficiency     Tobacco Use: History  Smoking Status   Former Smoker   Packs/day: 1.50   Quit date: 12/09/1999  Smokeless Tobacco   Never Used    Labs: Recent Review Flowsheet Data    There is no flowsheet data to display.       ADL UCSD:     Pulmonary Assessment Scores    Row Name 07/14/16 1253 09/02/16 1404       ADL UCSD   ADL Phase Entry Mid    SOB Score total 68  56    Rest 0 0    Walk 2 2    Stairs 4 5    Bath 3 3    Dress 2 2    Shop 5 5       Pulmonary Function Assessment:     Pulmonary Function Assessment - 07/14/16 1250      Initial Spirometry Results   FVC% 28 %   FEV1% 24 %   FEV1/FVC Ratio 65.06   Comments Test date 07/14/16     Post Bronchodilator Spirometry Results   FVC% 43 %   FEV1% 28 %   FEV1/FVC Ratio 58.31     Breath   Bilateral Breath Sounds Clear;Decreased   Shortness of Breath Yes;Limiting activity;Fear of Shortness of Breath      Exercise Target Goals:    Exercise Program Goal: Individual exercise prescription set with THRR, safety & activity barriers. Participant demonstrates ability to understand and report  RPE using BORG scale, to self-measure pulse accurately, and to acknowledge the importance of the exercise prescription.  Exercise Prescription Goal: Starting with aerobic activity 30 plus minutes a day, 3 days per week for initial exercise prescription. Provide home exercise prescription and guidelines that participant acknowledges understanding prior to discharge.  Activity Barriers & Risk Stratification:     Activity Barriers & Cardiac Risk Stratification - 07/14/16 1249      Activity Barriers & Cardiac Risk Stratification   Activity Barriers Arthritis;Back Problems;Shortness of Breath;Deconditioning;Right Hip Replacement   Cardiac Risk Stratification Moderate      6 Minute Walk:     6 Minute Walk    Row Name 07/14/16 1300 07/14/16 1329 09/02/16 1221     6 Minute Walk   Phase  --  -- Mid Program   Distance 712 feet 712 feet 900 feet   Distance % Change  --  -- 26 %   Walk Time 4.75 minutes 4.75 minutes 5.3 minutes   # of Rest Breaks _0 MPH 1.7 1.7 1.93   METS 1.43 2.3 2.45   RPE _1 Perceived Dyspnea  _2 VO2 Peak 5.02 5.02 6.7   Symptoms Yes (comment) Yes (comment) No   Comments Short of breath short of breath  --   Resting HR 78 bpm 78 bpm 85 bpm   Resting BP 134/72 134/72 128/70   Max Ex. HR 102 bpm 102 bpm 103 bpm   Max Ex. BP 134/60 134/60 152/64     Interval HR   Baseline HR 78 78  --   1 Minute HR 98 98  --   2 Minute HR 102 102  --   3 Minute HR 98 98  --   4 Minute HR 102 102  --   5 Minute HR 96 96  --   6 Minute HR 101 101  --   2 Minute Post HR 87 87  --   Interval Heart Rate? Yes Yes  --     Interval Oxygen   Interval Oxygen? Yes  --  --   Baseline Oxygen Saturation % 95 % 95 %  --   Baseline Liters of Oxygen 3 L 3 L  --   1 Minute Oxygen Saturation % 85 % 85 %  --   1 Minute Liters of Oxygen 3 L 3 L  --   2 Minute Oxygen Saturation % 86 % 86 %  --   2 Minute Liters of Oxygen  3 L 3 L  --   3 Minute Oxygen Saturation % 86 % 86 %   --   3 Minute Liters of Oxygen 3 L 3 L  --   4 Minute Oxygen Saturation % 85 % 85 %  --   4 Minute Liters of Oxygen 3 L 3 L  --   5 Minute Oxygen Saturation % 86 % 86 %  --   5 Minute Liters of Oxygen 3 L 3 L  --   6 Minute Oxygen Saturation % 83 % 83 %  --   6 Minute Liters of Oxygen 3 L 3 L  --   2 Minute Post Oxygen Saturation % 94 % 94 %  --   2 Minute Post Liters of Oxygen 3 L 3 L  --      Initial Exercise Prescription:     Initial Exercise Prescription - 07/14/16 1300      Date of Initial Exercise RX and Referring Provider   Date 07/14/16   Referring Provider Ashby Dawes     Oxygen   Oxygen Continuous   Liters 3     Treadmill   MPH 1.5   Grade 0   Minutes 15  3/3/3/3   METs 2.15     REL-XR   Level 2   Minutes 15   METs 2     T5 Nustep   Level 1   Minutes 15   METs 2     Prescription Details   Frequency (times per week) 3   Duration Progress to 45 minutes of aerobic exercise without signs/symptoms of physical distress     Intensity   THRR 40-80% of Max Heartrate 104-129   Ratings of Perceived Exertion 11-13   Perceived Dyspnea 0-4     Progression   Progression Continue to progress workloads to maintain intensity without signs/symptoms of physical distress.     Resistance Training   Training Prescription Yes   Weight 2   Reps 10-12      Perform Capillary Blood Glucose checks as needed.  Exercise Prescription Changes:     Exercise Prescription Changes    Row Name 07/27/16 1200 07/30/16 1100 08/11/16 1500 08/26/16 1300 09/02/16 1200     Exercise Review   Progression  --  -- Yes Yes  --     Response to Exercise   Blood Pressure (Admit) 114/60 126/56  -- 142/72  --   Blood Pressure (Exercise) 130/80 124/64 144/70 134/66  --   Blood Pressure (Exit) 130/68 128/70 120/80 144/78  --   Heart Rate (Admit) 82 bpm 82 bpm  -- 88 bpm  --   Heart Rate (Exercise) 102 bpm 97 bpm 95 bpm 112 bpm  --   Heart Rate (Exit) 90 bpm 70 bpm 90 bpm 80 bpm  --    Oxygen Saturation (Admit) 95 % 95 % 94 % 93 %  --   Oxygen Saturation (Exercise) 86 %  Sat dropped to 86 at RA on TM. W/ 2L of O2 Sat increased 91. 91 % 91 % 93 %  --   Oxygen Saturation (Exit) 93 % 93 % 89 % 97 %  --   Rating of Perceived Exertion (Exercise) _0 --   Perceived Dyspnea (Exercise) 2 0 2 2  --   Duration  --  --  -- Progress to 45 minutes of aerobic exercise without signs/symptoms of physical distress  --   Intensity  --  --  --  THRR unchanged  --     Progression   Progression  -- Continue to progress workloads to maintain intensity without signs/symptoms of physical distress. Continue to progress workloads to maintain intensity without signs/symptoms of physical distress. Continue to progress workloads to maintain intensity without signs/symptoms of physical distress.  --     Horticulturist, commercial Prescription Yes Yes Yes Yes  --   Weight _0 --   Reps 10-12 10-12 10-12 10-12  --     Interval Training   Interval Training  -- No No No  --     Oxygen   Oxygen Continuous Continuous Continuous Continuous  --   Liters 3 2  added om treadmill only 2 2  --     Treadmill   MPH 1.5 1 1.1 1.5  --   Grade 0 0 0 0  --   Minutes 15  3/3/3/_1 --   METs 2.15  --  -- 2.15  --     NuStep   Level  -- _2 --   Minutes  -- _3 --   METs  --  --  -- 2.5  --     REL-XR   Level _4 --   Minutes _5 --   METs 2 1.7  --  --  --     T5 Nustep   Level 1  --  --  --  --   Minutes 15  --  --  --  --   METs 2  --  --  --  --     Home Exercise Plan   Plans to continue exercise at  --  --  --  -- Catherine Landry   Frequency  --  --  --  -- Add 2 additional days to program exercise sessions.      Exercise Comments:     Exercise Comments    Row Name 07/14/16 1312 07/27/16 1218 07/30/16 1147 08/11/16 1519 08/26/16 1311   Exercise Comments Catherine Landry began the test on room air but droppped quickly too low to continue the test.   She completed the test on 3L continuous oxygen. First full day of exercise!  Patient was oriented to gym and equipment including functions, settings, policies, and procedures.  Patient's individual exercise prescription and treatment plan were reviewed.  All starting workloads were established based on the results of the 6 minute walk test done at initial orientation visit.  The plan for exercise progression was also introduced and progression will be customized based on patient's performance and goals. Catherine Landry 02 dropped to 47 on Tm so she has added 2L continuous oxygen on TM only.  O2 sats were 91 with oxygen. Catherine Landry is progressing well with exerccise. Catherine Landry is progressing well with exercise and has maintained normal )2 levels since adding 2L continuous during exercise.   [place] Name 09/02/16 1215           Exercise Comments  Reviewed home exercise with pt today.  Pt plans to walk/join Catherine Landry for exercise.  Reviewed THR, pulse, RPE, sign and symptoms, NTG use, and when to call 911 or MD.  Also discussed weather considerations and indoor options.  Pt voiced understanding.          Discharge Exercise Prescription (Final Exercise Prescription Changes):     Exercise Prescription Changes - 09/02/16 1200  Home Exercise Plan   Plans to continue exercise at [place] 2 additional days to program exercise sessions.       Nutrition:  Target Goals: Understanding of nutrition guidelines, daily intake of sodium '1500mg'$ , cholesterol '200mg'$ , calories 30% from fat and 7% or less from saturated fats, daily to have 5 or more servings of fruits and vegetables.  Biometrics:     Pre Biometrics - 07/14/16 1257      Pre Biometrics   Height 5' 2.5" (1.588 Landry)   Weight 147 lb 12.8 oz (67 kg)   Waist Circumference 33.5 inches   Hip Circumference 42.5 inches   Waist to Hip Ratio 0.79 %   BMI (Calculated) 26.7       Nutrition Therapy Plan and Nutrition Goals:   Nutrition  Discharge: Rate Your Plate Scores:   Psychosocial: Target Goals: Acknowledge presence or absence of depression, maximize coping skills, provide positive support system. Participant is able to verbalize types and ability to use techniques and skills needed for reducing stress and depression.  Initial Review & Psychosocial Screening:     Initial Psych Review & Screening - 07/14/16 [place]? Yes   Comments Catherine Landry has good support from he husband of 33yr. She has faced cancer twice and now COPD, but she has a very positive outlook on life. One concern is her husband's early stages of Alzheimers and his care. Catherine MGallicchiois looking forward to LMound     Barriers   Psychosocial barriers to participate in program The patient should benefit from training in stress management and relaxation.     Screening Interventions   Interventions Encouraged to exercise;Program counselor consult      Quality of Life Scores:     Quality of Life - 07/14/16 1307      Quality of Life Scores   Health/Function Pre 21 %   Socioeconomic Pre 21 %   Psych/Spiritual Pre 21 %   Family Pre 21 %   GLOBAL Pre 21 %      PHQ-9: Recent Review Flowsheet Data    [facility]   Decreased Interest 0   Down, Depressed, Hopeless 0   PHQ - 2 Score 0   Altered sleeping 0   Tired, decreased energy 1   Change in appetite 1   Feeling bad or failure about yourself  0   Trouble concentrating 0   Moving slowly or fidgety/restless 0   Suicidal thoughts 0   PHQ-9 Score 2   Difficult doing work/chores Not difficult at all      Psychosocial Evaluation and Intervention:     Psychosocial Evaluation - 07/27/16 1149      Psychosocial Evaluation & Interventions   Interventions Encouraged to exercise with the program and follow exercise prescription   Comments Counselor met with Catherine. MBurnett Landry(Catherine Landry today for initial psychosocial evaluation.  She is a  80year old who has been diagnosed with COPD.  She has a strong support system with a spouse of over 461years; (2) adult children who live closeby; and a sister and niece locally as well.  Catherine. MJerilynn Mageshas a healthy history of bladder cancer and [facility]  She also lost a kidney to cancer in 2000.  She reports sleeping well and having a good appetite.  She denies a history of depression or  anxiety or any current symptoms. She states she is typically positive most of the time and has minimal stress in her life currently.  She has goals to walk better without being short of breath.  Staff will follow with Catherine Landry throughout the course of this program.        Psychosocial Re-Evaluation:     Psychosocial Re-Evaluation    Row Name 08/12/16 1029 08/19/16 1135           Psychosocial Re-Evaluation   Comments Counselor follow up with Catherine Landry today reporting she is feeling stronger in general and has been breathing better overall; but was struggling some today with shortness of breath.  She continues to be in a positive mood although she has some stress with her spouse's health declining.  She is unable to go away for Thanksgiving with family but she and her spouse are joining friends at a [employer] and she seemed fine with that option.  Catherine Landry states she is sleeping well and enjoying this program.  Staff will continue to follow with her.   Catherine Landry reports having a good Thanksgiving with friends at a [employer] and her spouse was able to accompany her.  She is reporting more energy and ability to work in her yard now than before coming into this program.  Catherine Landry also was happy to announce that she has not had to use her Nebulizer for the past two weeks and she was actively using it daily for the last few years.  She is proud of her progress and is handling the stress of her spouses's declining health better as a result of her positive self-care.   Counselor commended Catherine Landry for her progress made and her commitment to exercise and this program.          Education: Education Goals: Education classes will be provided on a weekly basis, covering required topics. Participant will state understanding/return demonstration of topics presented.  Learning Barriers/Preferences:     Learning Barriers/Preferences - 07/14/16 1250      Learning Barriers/Preferences   Learning Barriers None   Learning Preferences None      Education Topics: Initial Evaluation Education: - Verbal, written and demonstration of respiratory meds, RPE/PD scales, oximetry and breathing techniques. Instruction on use of nebulizers and MDIs: cleaning and proper use, rinsing mouth with steroid doses and importance of monitoring MDI activations. Flowsheet Row Pulmonary Rehab from 09/04/2016 in [facility] Cardiac and Pulmonary Rehab  Date  07/14/16  Educator  LB  Instruction Review Code  2- meets goals/outcomes      General Nutrition Guidelines/Fats and Fiber: -Group instruction provided by verbal, written material, models and posters to present the general guidelines for heart healthy nutrition. Gives an explanation and review of dietary fats and fiber. Flowsheet Row Pulmonary Rehab from 09/04/2016 in [facility] Cardiac and Pulmonary Rehab  Date  08/10/16  Educator  CR  Instruction Review Code  2- meets goals/outcomes      Controlling Sodium/Reading Food Labels: -Group verbal and written material supporting the discussion of sodium use in heart healthy nutrition. Review and explanation with models, verbal and written materials for utilization of the food label. Flowsheet Row Pulmonary Rehab from 09/04/2016 in [facility] Cardiac and Pulmonary Rehab  Date  08/17/16  Educator  CR  Instruction Review Code  2- meets goals/outcomes      Exercise Physiology & Risk Factors: - Group verbal and written instruction with models to review the exercise physiology of the cardiovascular  system and associated critical values. Details cardiovascular disease risk factors and the goals associated with each risk factor. Flowsheet Row Pulmonary Rehab from 09/04/2016 in [facility] Cardiac and Pulmonary Rehab  Date  08/05/16  Educator  AS  Instruction Review Code  2- meets goals/outcomes      Aerobic Exercise & Resistance Training: - Gives group verbal and written discussion on the health impact of inactivity. On the components of aerobic and resistive training programs and the benefits of this training and how to safely progress through these programs.   Flexibility, Balance, General Exercise Guidelines: - Provides group verbal and written instruction on the benefits of flexibility and balance training programs. Provides general exercise guidelines with specific guidelines to those with heart or lung disease. Demonstration and skill practice provided.   Stress Management: - Provides group verbal and written instruction about the health risks of elevated stress, cause of high stress, and healthy ways to reduce stress.   Depression: - Provides group verbal and written instruction on the correlation between heart/lung disease and depressed mood, treatment options, and the stigmas associated with seeking treatment. Flowsheet Row Pulmonary Rehab from 09/04/2016 in [facility] Cardiac and Pulmonary Rehab  Date  08/24/16  Educator  [facility]  Instruction Review Code  2- meets goals/outcomes      Exercise & Equipment Safety: - Individual verbal instruction and demonstration of equipment use and safety with use of the equipment. Flowsheet Row Pulmonary Rehab from 09/04/2016 in [facility] Cardiac and Pulmonary Rehab  Date  07/27/16  Educator  LB  Instruction Review Code  2- meets goals/outcomes      Infection Prevention: - Provides verbal and written material to individual with discussion of infection control including proper hand washing and proper equipment cleaning during exercise  session. Flowsheet Row Pulmonary Rehab from 09/04/2016 in [facility] Cardiac and Pulmonary Rehab  Date  07/27/16  Educator  LB  Instruction Review Code  2- meets goals/outcomes      Falls Prevention: - Provides verbal and written material to individual with discussion of falls prevention and safety. Flowsheet Row Pulmonary Rehab from 09/04/2016 in [facility] Cardiac and Pulmonary Rehab  Date  07/14/16  Educator  LB  Instruction Review Code  2- meets goals/outcomes      Diabetes: - Individual verbal and written instruction to review signs/symptoms of diabetes, desired ranges of glucose level fasting, after meals and with exercise. Advice that pre and post exercise glucose checks will be done for 3 sessions at entry of program.   Chronic Lung Diseases: - Group verbal and written instruction to review new updates, new respiratory medications, new advancements in procedures and treatments. Provide informative websites and "800" numbers of self-education.   Lung Procedures: - Group verbal and written instruction to describe testing methods done to diagnose lung disease. Review the outcome of test results. Describe the treatment choices: Pulmonary Function Tests, ABGs and oximetry.   Energy Conservation: - Provide group verbal and written instruction for methods to conserve energy, plan and organize activities. Instruct on pacing techniques, use of adaptive equipment and posture/positioning to relieve shortness of breath.   Triggers: - Group verbal and written instruction to review types of environmental controls: home humidity, furnaces, filters, dust mite/pet prevention, HEPA vacuums. To discuss weather changes, air quality and the benefits of nasal washing.   Exacerbations: - Group verbal and written instruction to provide: warning signs, infection symptoms, calling MD promptly, preventive modes, and value of vaccinations. Review: effective airway clearance, coughing and/or vibration  techniques. Create an  Action Plan. Flowsheet Row Pulmonary Rehab from 09/04/2016 in [facility] Cardiac and Pulmonary Rehab  Date  08/26/16  Educator  LB  Instruction Review Code  2- meets goals/outcomes      Oxygen: - Individual and group verbal and written instruction on oxygen therapy. Includes supplement oxygen, available portable oxygen systems, continuous and intermittent flow rates, oxygen safety, concentrators, and Medicare reimbursement for oxygen. Flowsheet Row Pulmonary Rehab from 09/04/2016 in [facility] Cardiac and Pulmonary Rehab  Date  07/14/16  Educator  LB  Instruction Review Code  2- meets goals/outcomes      Respiratory Medications: - Group verbal and written instruction to review medications for lung disease. Drug class, frequency, complications, importance of spacers, rinsing mouth after steroid MDI's, and proper cleaning methods for nebulizers. Flowsheet Row Pulmonary Rehab from 09/04/2016 in [facility] Cardiac and Pulmonary Rehab  Date  07/14/16  Educator  LB  Instruction Review Code  2- meets goals/outcomes      AED/CPR: - Group verbal and written instruction with the use of models to demonstrate the basic use of the AED with the basic ABC's of resuscitation. Flowsheet Row Pulmonary Rehab from 09/04/2016 in [facility] Cardiac and Pulmonary Rehab  Date  07/31/16  Educator  C. EnterkinRN  Instruction Review Code  2- meets goals/outcomes      Breathing Retraining: - Provides individuals verbal and written instruction on purpose, frequency, and proper technique of diaphragmatic breathing and pursed-lipped breathing. Applies individual practice skills. Flowsheet Row Pulmonary Rehab from 09/04/2016 in [facility] Cardiac and Pulmonary Rehab  Date  07/27/16  Educator  LB  Instruction Review Code  2- meets goals/outcomes      Anatomy and Physiology of the Lungs: - Group verbal and written instruction with the use of models to provide basic lung anatomy and physiology related to function,  structure and complications of lung disease. Flowsheet Row Pulmonary Rehab from 09/04/2016 in [facility] Cardiac and Pulmonary Rehab  Date  08/19/16  Educator  LB  Instruction Review Code  2- meets goals/outcomes      Heart Failure: - Group verbal and written instruction on the basics of heart failure: signs/symptoms, treatments, explanation of ejection fraction, enlarged heart and cardiomyopathy. Flowsheet Row Pulmonary Rehab from 09/04/2016 in [facility] Cardiac and Pulmonary Rehab  Date  09/04/16  Educator  SB  Instruction Review Code  2- meets goals/outcomes      Sleep Apnea: - Individual verbal and written instruction to review Obstructive Sleep Apnea. Review of risk factors, methods for diagnosing and types of masks and machines for OSA.   Anxiety: - Provides group, verbal and written instruction on the correlation between heart/lung disease and anxiety, treatment options, and management of anxiety.   Relaxation: - Provides group, verbal and written instruction about the benefits of relaxation for patients with heart/lung disease. Also provides patients with examples of relaxation techniques.   Knowledge Questionnaire Score:     Knowledge Questionnaire Score - 07/14/16 1250      Knowledge Questionnaire Score   Pre Score 8/10       Core Components/Risk Factors/Patient Goals at Admission:     Personal Goals and Risk Factors at Admission - 07/14/16 1257      Core Components/Risk Factors/Patient Goals on Admission   Sedentary Yes  Likes to walk and camp   Intervention Provide advice, education, support and counseling about physical activity/exercise needs.;Develop an individualized exercise prescription for aerobic and resistive training based on initial evaluation findings, risk stratification, comorbidities and participant's personal goals.   Expected Outcomes  Achievement of increased cardiorespiratory fitness and enhanced flexibility, muscular endurance and strength shown  through measurements of functional capacity and personal statement of participant.   Increase Strength and Stamina Yes   Intervention Provide advice, education, support and counseling about physical activity/exercise needs.;Develop an individualized exercise prescription for aerobic and resistive training based on initial evaluation findings, risk stratification, comorbidities and participant's personal goals.   Expected Outcomes Achievement of increased cardiorespiratory fitness and enhanced flexibility, muscular endurance and strength shown through measurements of functional capacity and personal statement of participant.   Improve shortness of breath with ADL's Yes   Intervention Provide education, individualized exercise plan and daily activity instruction to help decrease symptoms of SOB with activities of daily living.   Expected Outcomes Short Term: Achieves a reduction of symptoms when performing activities of daily living.   Develop more efficient breathing techniques such as purse lipped breathing and diaphragmatic breathing; and practicing self-pacing with activity Yes   Intervention Provide education, demonstration and support about specific breathing techniuqes utilized for more efficient breathing. Include techniques such as pursed lipped breathing, diaphragmatic breathing and self-pacing activity.   Expected Outcomes Short Term: Participant will be able to demonstrate and use breathing techniques as needed throughout daily activities.   Increase knowledge of respiratory medications and ability to use respiratory devices properly  Yes  Advair, Combivent Respimat, SVN Albuterol; oxygen 2l/Landry at night and as needed   Intervention Provide education and demonstration as needed of appropriate use of medications, inhalers, and oxygen therapy.   Expected Outcomes Short Term: Achieves understanding of medications use. Understands that oxygen is a medication prescribed by physician. Demonstrates  appropriate use of inhaler and oxygen therapy.   Hypertension Yes   Intervention Provide education on lifestyle modifcations including regular physical activity/exercise, weight management, moderate sodium restriction and increased consumption of fresh fruit, vegetables, and low fat dairy, alcohol moderation, and smoking cessation.;Monitor prescription use compliance.   Expected Outcomes Short Term: Continued assessment and intervention until BP is < 140/2m HG in hypertensive participants. < 130/852mHG in hypertensive participants with diabetes, heart failure or chronic kidney disease.;Long Term: Maintenance of blood pressure at goal levels.      Core Components/Risk Factors/Patient Goals Review:      Goals and Risk Factor Review    Row Name 07/27/16 1207 08/10/16 0849 08/24/16 1532         Core Components/Risk Factors/Patient Goals Review   Personal Goals Review Develop more efficient breathing techniques such as purse lipped breathing and diaphragmatic breathing and practicing self-pacing with activity. Sedentary;Increase Strength and Stamina;Improve shortness of breath with ADL's;Increase knowledge of respiratory medications and ability to use respiratory devices properly.;Develop more efficient breathing techniques such as purse lipped breathing and diaphragmatic breathing and practicing self-pacing with activity.;Hypertension Sedentary;Increase Strength and Stamina;Improve shortness of breath with ADL's;Increase knowledge of respiratory medications and ability to use respiratory devices properly.;Develop more efficient breathing techniques such as purse lipped breathing and diaphragmatic breathing and practicing self-pacing with activity.;Hypertension     Review Pursed lip breathing techniques were explained and discussed with the patient. The patient demonstrated understanding of this breathing technique and when to use it during exercise and ADL's.  Catherine Ma[place]as increased [place] exercise  [place]als [place] at home for sleep, but she has not used it during the day. Walking on the treadmill has been difficult for Catherine MaLawheadith O2Sat's in the mid 80's, so we have added 2-3l/Landry of oxygen for O2Sat's in the low 90's. Shopping  and some housework make tired, so I have encouraged her to check her O2Sat's to check if they are low and if she should be wearing oxyge, pacing.Catherine Landry has a good understaning of her MDI's and has acceptable BP readings in [facility]. Catherine Landry continues to maintain acceptable BP during [facility]. She is compliant with her MDI's. Recently she felt an exacerbation coming on and immediately called Dr Ashby Dawes who called in a prescription for an antibiotic and prednisone taper. During the treadmill, Catherine Landry is using oxygen to maintain acceptable O2Sat's in the 90's and will also use it with her other exercise goals if needed. Her PLB technique is helpful for activity in [facility] and at home.      Expected Outcomes Patient will use PLB during exercise and ADL's to help improve oxygen saturations and control SOB.   -- Continue managing her COPD and using oxygen to maintain acceptable O2Sat's.        Core Components/Risk Factors/Patient Goals at Discharge (Final Review):      Goals and Risk Factor Review - 08/24/16 1532      Core Components/Risk Factors/Patient Goals Review   Personal Goals Review Sedentary;Increase Strength and Stamina;Improve shortness of breath with ADL's;Increase knowledge of respiratory medications and ability to use respiratory devices properly.;Develop more efficient breathing techniques such as purse lipped breathing and diaphragmatic breathing and practicing self-pacing with activity.;Hypertension   Review Catherine Landry continues to maintain acceptable BP during [facility]. She is compliant with her MDI's. Recently she felt an exacerbation coming on and immediately called Dr Ashby Dawes who called in a prescription for an  antibiotic and prednisone taper. During the treadmill, Catherine Landry is using oxygen to maintain acceptable O2Sat's in the 90's and will also use it with her other exercise goals if needed. Her PLB technique is helpful for activity in [facility] and at home.    Expected Outcomes Continue managing her COPD and using oxygen to maintain acceptable O2Sat's.      ITP Comments:     ITP Comments    Row Name 08/21/16 1100 08/31/16 1525         ITP Comments "Know Your Numbers" education was completed today with learning goals met.  Pt able to follow exercise prescription today without complaint.  Will continue to monitor for progression. Catherine Landry did meet with Dr Emily Filbert for her face-to-face encounter.          Comments: 30 day note review

## 2016-09-08 ENCOUNTER — Telehealth: Payer: Self-pay

## 2016-09-08 DIAGNOSIS — J441 Chronic obstructive pulmonary disease with (acute) exacerbation: Secondary | ICD-10-CM

## 2016-09-08 NOTE — Telephone Encounter (Signed)
lmtcb in regards to ONO.  Will await call back.

## 2016-09-09 ENCOUNTER — Telehealth: Payer: Self-pay | Admitting: Respiratory Therapy

## 2016-09-09 ENCOUNTER — Encounter: Payer: Self-pay | Admitting: Respiratory Therapy

## 2016-09-10 NOTE — Telephone Encounter (Signed)
Placed ONO in DR folder for further questions. Pt is aware of this.

## 2016-09-11 ENCOUNTER — Encounter: Payer: Medicare Other | Admitting: *Deleted

## 2016-09-11 DIAGNOSIS — J438 Other emphysema: Secondary | ICD-10-CM | POA: Diagnosis not present

## 2016-09-11 DIAGNOSIS — J439 Emphysema, unspecified: Secondary | ICD-10-CM

## 2016-09-11 NOTE — Progress Notes (Signed)
Daily Session Note  Patient Details  Name: Catherine Landry MRN: [id number] Date of Birth: [birth year] Referring Provider:   April Manson Pulmonary Rehab from 07/14/2016 in [facility] Cardiac and Pulmonary Rehab  Referring Provider  Ramachandran      Encounter Date: 09/11/2016  Check In:     Session Check In - 09/11/16 1047      Check-In   Location [facility]-Cardiac & Pulmonary Rehab   Staff Present Gerlene Burdock, RN, Vickki Hearing, BA, ACSM CEP, Exercise Physiologist;Patricia Surles RN BSN   Supervising physician immediately available to respond to emergencies [facility] immediately available ER MD   Physician(s) Dr. Joni Fears and Dr. Reita Cliche   Medication changes reported     No   Fall or balance concerns reported    No   Warm-up and Cool-down Performed as group-led instruction   Resistance Training Performed Yes   VAD Patient? No     Pain Assessment   Currently in Pain? No/denies         Goals Met:  Proper associated with RPD/PD & O2 Sat Exercise tolerated well  Goals Unmet:  Not Applicable  Comments:     Dr. Emily Filbert is Medical Director for [place] and [facility].

## 2016-09-17 ENCOUNTER — Encounter: Payer: Self-pay | Admitting: Podiatry

## 2016-09-17 ENCOUNTER — Ambulatory Visit (INDEPENDENT_AMBULATORY_CARE_PROVIDER_SITE_OTHER): Payer: Medicare Other | Admitting: Podiatry

## 2016-09-17 DIAGNOSIS — B351 Tinea unguium: Secondary | ICD-10-CM | POA: Diagnosis not present

## 2016-09-17 DIAGNOSIS — L601 Onycholysis: Secondary | ICD-10-CM

## 2016-09-19 NOTE — Progress Notes (Signed)
Subjective:  Patient presents today for a new complaint and concern for her right great toenail. Patient states that she is having problems with the great toenail to the right foot. Patient is not sure whether it is ingrown or fungus is present. Patient presents today for further treatment and evaluation    Objective/Physical Exam General: The patient is alert and oriented x3 in no acute distress.  Dermatology: Skin is warm, dry and supple bilateral lower extremities. Negative for open lesions or macerations. Right great toenail appears to be partially detached onycholysis with a new toenail growing underneath.  Vascular: Palpable pedal pulses bilaterally. No edema or erythema noted. Capillary refill within normal limits.  Neurological: Epicritic and protective threshold grossly intact bilaterally.   Musculoskeletal Exam: Range of motion within normal limits to all pedal and ankle joints bilateral. Muscle strength 5/5 in all groups bilateral.   Assessment: #1 onycholysis of the right great toenail #2 underlying new nail growth right great toe   Plan of Care:  #1 Patient was evaluated. #2 mechanical debridement of the old partially detached nail from the right great toenail was performed today using a nail nipper without incident or bleeding. #3 today we discussed possible etiologies and modalities that would cause the toe nail to fall off, including possible trauma.  #4 return to clinic when necessary   Edrick Kins, DPM [facility]  Dr. Edrick Kins, [address]                                        [place], [place] [address]                Office [phone]  Fax [phone]

## 2016-09-22 ENCOUNTER — Inpatient Hospital Stay: Admission: RE | Admit: 2016-09-22 | Payer: Medicare Other | Source: Ambulatory Visit

## 2016-09-23 ENCOUNTER — Encounter: Payer: Medicare Other | Attending: Internal Medicine

## 2016-09-23 DIAGNOSIS — J449 Chronic obstructive pulmonary disease, unspecified: Secondary | ICD-10-CM | POA: Diagnosis not present

## 2016-09-23 DIAGNOSIS — J439 Emphysema, unspecified: Secondary | ICD-10-CM

## 2016-09-23 DIAGNOSIS — J438 Other emphysema: Secondary | ICD-10-CM | POA: Diagnosis present

## 2016-09-23 NOTE — Telephone Encounter (Signed)
Pt aware to continue 2L O2 qhs. Pt aware and voiced her understanding. Nothing further needed.

## 2016-09-23 NOTE — Progress Notes (Signed)
Daily Session Note  Patient Details  Name: Catherine Landry MRN: [id number] Date of Birth: [birth year] Referring Provider:   April Manson Pulmonary Rehab from 07/14/2016 in [facility] Cardiac and Pulmonary Rehab  Referring Provider  Ramachandran      Encounter Date: 09/23/2016  Check In:     Session Check In - 09/23/16 1109      Check-In   Location [facility]-Cardiac & Pulmonary Rehab   Staff Present Carson Myrtle, BS, RRT, Respiratory Lennie Hummer, MA, ACSM RCEP, Exercise Physiologist;Abbott Jasinski Oletta Darter, BA, ACSM CEP, Exercise Physiologist   Supervising physician immediately available to respond to emergencies [facility] immediately available ER MD   Physician(s) Clearnce Hasten and Joni Fears   Medication changes reported     No   Warm-up and Cool-down Performed as group-led Location manager Performed Yes   VAD Patient? No     Pain Assessment   Currently in Pain? No/denies   Multiple Pain Sites No         Goals Met:  Proper associated with RPD/PD & O2 Sat Independence with exercise equipment Exercise tolerated well Strength training completed today  Goals Unmet:  Not Applicable  Comments: Pt able to follow exercise prescription today without complaint.  Will continue to monitor for progression.    Dr. Emily Filbert is Medical Director for [place] and [facility].

## 2016-09-23 NOTE — Addendum Note (Signed)
Addended by: Maryanna Shape A on: 09/23/2016 03:44 PM   Modules accepted: Orders

## 2016-09-25 ENCOUNTER — Encounter: Payer: Medicare Other | Admitting: *Deleted

## 2016-09-25 DIAGNOSIS — J438 Other emphysema: Secondary | ICD-10-CM | POA: Diagnosis not present

## 2016-09-25 DIAGNOSIS — J439 Emphysema, unspecified: Secondary | ICD-10-CM

## 2016-09-25 NOTE — Progress Notes (Signed)
Daily Session Note  Patient Details  Name: Catherine Landry MRN: [id number] Date of Birth: 08/15/1936 Referring Provider:   April Manson Pulmonary Rehab from 07/14/2016 in [facility] Cardiac and Pulmonary Rehab  Referring Provider  Ramachandran      Encounter Date: 09/25/2016  Check In:     Session Check In - 09/25/16 1029      Check-In   Location [facility]-Cardiac & Pulmonary Rehab   Staff Present Gerlene Burdock, RN, Vickki Hearing, BA, ACSM CEP, Exercise Physiologist;Koden Hunzeker RN BSN   Supervising physician immediately available to respond to emergencies [facility] immediately available ER MD   Physician(s) Drs. Paduchowski & Williams   Medication changes reported     No   Fall or balance concerns reported    No   Warm-up and Cool-down Performed as group-led Location manager Performed Yes   VAD Patient? No     Pain Assessment   Currently in Pain? No/denies   Multiple Pain Sites No           Exercise Prescription Changes - 09/24/16 1200      Exercise Review   Progression Yes     Response to Exercise   Blood Pressure (Admit) 124/70   Blood Pressure (Exercise) 124/64   Blood Pressure (Exit) 140/68   Heart Rate (Exercise) 93 bpm   Heart Rate (Exit) 83 bpm   Oxygen Saturation (Exercise) 92 %   Oxygen Saturation (Exit) 93 %   Rating of Perceived Exertion (Exercise) 12   Perceived Dyspnea (Exercise) 2   Duration Progress to 45 minutes of aerobic exercise without signs/symptoms of physical distress   Intensity THRR unchanged     Progression   Progression Continue to progress workloads to maintain intensity without signs/symptoms of physical distress.     Resistance Training   Training Prescription Yes   Weight 2   Reps 10-12     Interval Training   Interval Training No     Oxygen   Oxygen Continuous   Liters 2     Treadmill   MPH 1.4   Grade 0   Minutes 15   METs 2.07     NuStep   Level 4   Minutes 15   METs 2.2      Goals  Met:  Proper associated with RPD/PD & O2 Sat Independence with exercise equipment Using PLB without cueing & demonstrates good technique Exercise tolerated well No report of cardiac concerns or symptoms Strength training completed today  Goals Unmet:  Not Applicable  Comments: Pt able to follow exercise prescription today without complaint.  Will continue to monitor for progression.    Dr. Emily Filbert is Medical Director for [place] and [facility].

## 2016-09-30 DIAGNOSIS — J439 Emphysema, unspecified: Secondary | ICD-10-CM

## 2016-09-30 DIAGNOSIS — J438 Other emphysema: Secondary | ICD-10-CM | POA: Diagnosis not present

## 2016-09-30 NOTE — Progress Notes (Signed)
Daily Session Note  Patient Details  Name: Catherine Landry MRN: [id number] Date of Birth: 12-30-1935 Referring Provider:   Flowsheet Row Pulmonary Rehab from 07/14/2016 in [facility] Card[facility] and Pulmonary Rehab  Referring Provider  Ramachandran      Encounter Date: 09/30/2016  Check In:     Session Check In - 09/30/16 1155      Check-In   Location [facility]-Cardiac & Pulmonary Rehab   Staff Present Carson Myrtle, BS, RRT, Respiratory Lennie Hummer, MA, ACSM RCEP, Exercise Physiologist;Aithan Farrelly Oletta Darter, BA, ACSM CEP, Exercise Physiologist   Supervising physician immediately available to respond to emergencies [facility] immediately available ER MD   Physician(s) Darl Householder and Alfred Levins   Medication changes reported     No   Fall or balance concerns reported    No   Warm-up and Cool-down Performed as group-led Location manager Performed Yes   VAD Patient? No     Pain Assessment   Currently in Pain? No/denies   Multiple Pain Sites No         Goals Met:  Proper associated with RPD/PD & O2 Sat Independence with exercise equipment Exercise tolerated well Strength training completed today  Goals Unmet:  Not Applicable  Comments: Face to face meeting with Dr. Sabra Heck today.   Dr. Emily Filbert is Medical Director for [place] and [facility].

## 2016-10-05 ENCOUNTER — Encounter: Payer: Medicare Other | Admitting: *Deleted

## 2016-10-05 ENCOUNTER — Encounter: Payer: Self-pay | Admitting: Respiratory Therapy

## 2016-10-05 DIAGNOSIS — J439 Emphysema, unspecified: Secondary | ICD-10-CM

## 2016-10-05 DIAGNOSIS — J438 Other emphysema: Secondary | ICD-10-CM | POA: Diagnosis not present

## 2016-10-05 NOTE — Progress Notes (Signed)
Daily Session Note  Patient Details  Name: Catherine Landry MRN: [id number] Date of Birth: [birth year] Referring Provider:   April Manson Pulmonary Rehab from 07/14/2016 in [facility] Cardiac and Pulmonary Rehab  Referring Provider  Ramachandran      Encounter Date: 10/05/2016  Check In:     Session Check In - 10/05/16 1131      Check-In   Location [facility]-Cardiac & Pulmonary Rehab   Staff Present Carson Myrtle, BS, RRT, Respiratory Dareen Piano, BA, ACSM CEP, Exercise Physiologist;Noorah Giammona Amedeo Plenty, BS, ACSM CEP, Exercise Physiologist   Supervising physician immediately available to respond to emergencies [facility] immediately available ER MD   Physician(s) Drs. McShane and Kinner   Medication changes reported     No   Fall or balance concerns reported    No   Warm-up and Cool-down Performed on first and last piece of equipment   Resistance Training Performed Yes   VAD Patient? No     Pain Assessment   Currently in Pain? No/denies   Multiple Pain Sites No         Goals Met:  Proper associated with RPD/PD & O2 Sat Independence with exercise equipment Exercise tolerated well Strength training completed today  Goals Unmet:  Not Applicable  Comments: Pt able to follow exercise prescription today without complaint.  Will continue to monitor for progression.    Dr. Emily Filbert is Medical Director for [place] and [facility].

## 2016-10-05 NOTE — Progress Notes (Signed)
Pulmonary Individual Treatment Plan  Patient Details  Name: Catherine Landry MRN: [id number] Date of Birth: Sep 14, 1936 Referring Provider:   Flowsheet Row Pulmonary Rehab from 07/14/2016 in [facility] Card[facility] and Pulmonary Rehab  Referring Provider  Ramachandran      Initial Encounter Date:  Flowsheet Row Pulmonary Rehab from 07/14/2016 in [facility] Cardiac and Pulmonary Rehab  Date  07/14/16  Referring Provider  Ashby Dawes      Visit Diagnosis: Pulmonary emphysema, unspecified emphysema type ([place])  Patient's Home Medications on Admission:  Current Outpatient Prescriptions:    ADVAIR DISKUS 250-50 MCG/DOSE AEPB, Inhale 1 puff into the lungs 2 (two) times daily. *Rinse mouth after use*, Disp: , Rfl: 1   albuterol (PROVENTIL) (2.5 MG/3ML) 0.083% nebulizer solution, Take 3-6 mLs by nebulization every 6 (six) hours as needed. For shortness of breath/wheezing., Disp: , Rfl: 5   aspirin EC 81 MG tablet, Take 81 mg by mouth every morning., Disp: , Rfl:    bisacodyl (DULCOLAX) 5 MG EC tablet, Take 1 tablet (5 mg total) by mouth daily as needed for moderate constipation., Disp: 30 tablet, Rfl: 0   Calcium-Magnesium-Vitamin D (CALCIUM 1200+D3 PO), Take 1 tablet by mouth every morning., Disp: , Rfl:    COMBIVENT RESPIMAT 20-100 MCG/ACT AERS respimat, Inhale 1 puff into the lungs 4 (four) times daily as needed. For COPD., Disp: , Rfl: 5   dicyclomine (BENTYL) 10 MG capsule, Take 10 mg by mouth 2 (two) times daily as needed for muscle spasms. For up to 10 days, Disp: , Rfl:    losartan (COZAAR) 50 MG tablet, Take 50 mg by mouth every morning., Disp: , Rfl:    metoprolol succinate (TOPROL-XL) 25 MG 24 hr tablet, Take 25 mg by mouth every morning., Disp: , Rfl:    Multiple Vitamin (MULTIVITAMIN WITH MINERALS) TABS, Take 1 tablet by mouth every morning., Disp: , Rfl:    NEOMYCIN-POLYMYXIN-HYDROCORTISONE (CORTISPORIN) 1 % SOLN otic solution, Apply 1-2 drops to toe BID after soaking, Disp: 10 mL,  Rfl: 1   omeprazole (PRILOSEC) 40 MG capsule, Take 40 mg by mouth 3 (three) times a week. , Disp: , Rfl: 5   polyethylene glycol powder (GLYCOLAX/MIRALAX) powder, Take 17 g by mouth daily as needed for constipation. Mix with 4 to 8 ounces of fluid., Disp: , Rfl:    predniSONE (DELTASONE) 10 MG tablet, Start with 61m daily and decrease by 167mdaily, Disp: 21 tablet, Rfl: 0   predniSONE (STERAPRED UNI-PAK 21 TAB) 10 MG (21) TBPK tablet, Take 6 tablets on Day 1, then 5-4-3-2-1 until complete, Disp: 21 tablet, Rfl: 0   theophylline (THEO-24) 300 MG 24 hr capsule, Take 1 capsule (300 mg total) by mouth every morning., Disp: 30 capsule, Rfl: 0  Past Medical History: Past Medical History:  Diagnosis Date   Asthma    [place]   [place]    Hypertension    Renal insufficiency     Tobacco Use: History  Smoking Status   Former Smoker   Packs/day: 1.50   Quit date: 12/09/1999  Smokeless Tobacco   Never Used    Labs: Recent Review Flowsheet Data    There is no flowsheet data to display.       ADL UCSD:     Pulmonary Assessment Scores    Row Name 07/14/16 1253 09/02/16 1404       ADL UCSD   ADL Phase Entry Mid    SOB Score total 68  56    Rest 0 0    Walk 2 2    Stairs 4 5    Bath 3 3    Dress 2 2    Shop 5 5       Pulmonary Function Assessment:     Pulmonary Function Assessment - 07/14/16 1250      Initial Spirometry Results   FVC% 28 %   FEV1% 24 %   FEV1/FVC Ratio 65.06   Comments Test date 07/14/16     Post Bronchodilator Spirometry Results   FVC% 43 %   FEV1% 28 %   FEV1/FVC Ratio 58.31     Breath   Bilateral Breath Sounds Clear;Decreased   Shortness of Breath Yes;Limiting activity;Fear of Shortness of Breath      Exercise Target Goals:    Exercise Program Goal: Individual exercise prescription set with THRR, safety & activity barriers. Participant demonstrates ability to understand and report  RPE using BORG scale, to self-measure pulse accurately, and to acknowledge the importance of the exercise prescription.  Exercise Prescription Goal: Starting with aerobic activity 30 plus minutes a day, 3 days per week for initial exercise prescription. Provide home exercise prescription and guidelines that participant acknowledges understanding prior to discharge.  Activity Barriers & Risk Stratification:     Activity Barriers & Cardiac Risk Stratification - 07/14/16 1249      Activity Barriers & Cardiac Risk Stratification   Activity Barriers Arthritis;Back Problems;Shortness of Breath;Deconditioning;Right Hip Replacement   Cardiac Risk Stratification Moderate      6 Minute Walk:     6 Minute Walk    Row Name 07/14/16 1300 07/14/16 1329 09/02/16 1221     6 Minute Walk   Phase  --  -- Mid Program   Distance 712 feet 712 feet 900 feet   Distance % Change  --  -- 26 %   Walk Time 4.75 minutes 4.75 minutes 5.3 minutes   # of Rest Breaks _0 MPH 1.7 1.7 1.93   METS 1.43 2.3 2.45   RPE _1 Perceived Dyspnea  _2 VO2 Peak 5.02 5.02 6.7   Symptoms Yes (comment) Yes (comment) No   Comments Short of breath short of breath  --   Resting HR 78 bpm 78 bpm 85 bpm   Resting BP 134/72 134/72 128/70   Max Ex. HR 102 bpm 102 bpm 103 bpm   Max Ex. BP 134/60 134/60 152/64     Interval HR   Baseline HR 78 78  --   1 Minute HR 98 98  --   2 Minute HR 102 102  --   3 Minute HR 98 98  --   4 Minute HR 102 102  --   5 Minute HR 96 96  --   6 Minute HR 101 101  --   2 Minute Post HR 87 87  --   Interval Heart Rate? Yes Yes  --     Interval Oxygen   Interval Oxygen? Yes  --  --   Baseline Oxygen Saturation % 95 % 95 %  --   Baseline Liters of Oxygen 3 L 3 L  --   1 Minute Oxygen Saturation % 85 % 85 %  --   1 Minute Liters of Oxygen 3 L 3 L  --   2 Minute Oxygen Saturation % 86 % 86 %  --   2 Minute Liters of Oxygen  3 L 3 L  --   3 Minute Oxygen Saturation % 86 % 86 %   --   3 Minute Liters of Oxygen 3 L 3 L  --   4 Minute Oxygen Saturation % 85 % 85 %  --   4 Minute Liters of Oxygen 3 L 3 L  --   5 Minute Oxygen Saturation % 86 % 86 %  --   5 Minute Liters of Oxygen 3 L 3 L  --   6 Minute Oxygen Saturation % 83 % 83 %  --   6 Minute Liters of Oxygen 3 L 3 L  --   2 Minute Post Oxygen Saturation % 94 % 94 %  --   2 Minute Post Liters of Oxygen 3 L 3 L  --      Initial Exercise Prescription:     Initial Exercise Prescription - 07/14/16 1300      Date of Initial Exercise RX and Referring Provider   Date 07/14/16   Referring Provider Ashby Dawes     Oxygen   Oxygen Continuous   Liters 3     Treadmill   MPH 1.5   Grade 0   Minutes 15  3/3/3/3   METs 2.15     REL-XR   Level 2   Minutes 15   METs 2     T5 Nustep   Level 1   Minutes 15   METs 2     Prescription Details   Frequency (times per week) 3   Duration Progress to 45 minutes of aerobic exercise without signs/symptoms of physical distress     Intensity   THRR 40-80% of Max Heartrate 104-129   Ratings of Perceived Exertion 11-13   Perceived Dyspnea 0-4     Progression   Progression Continue to progress workloads to maintain intensity without signs/symptoms of physical distress.     Resistance Training   Training Prescription Yes   Weight 2   Reps 10-12      Perform Capillary Blood Glucose checks as needed.  Exercise Prescription Changes:     Exercise Prescription Changes    Row Name 07/27/16 1200 07/30/16 1100 08/11/16 1500 08/26/16 1300 09/02/16 1200     Exercise Review   Progression  --  -- Yes Yes  --     Response to Exercise   Blood Pressure (Admit) 114/60 126/56  -- 142/72  --   Blood Pressure (Exercise) 130/80 124/64 144/70 134/66  --   Blood Pressure (Exit) 130/68 128/70 120/80 144/78  --   Heart Rate (Admit) 82 bpm 82 bpm  -- 88 bpm  --   Heart Rate (Exercise) 102 bpm 97 bpm 95 bpm 112 bpm  --   Heart Rate (Exit) 90 bpm 70 bpm 90 bpm 80 bpm  --    Oxygen Saturation (Admit) 95 % 95 % 94 % 93 %  --   Oxygen Saturation (Exercise) 86 %  Sat dropped to 86 at RA on TM. W/ 2L of O2 Sat increased 91. 91 % 91 % 93 %  --   Oxygen Saturation (Exit) 93 % 93 % 89 % 97 %  --   Rating of Perceived Exertion (Exercise) _0 --   Perceived Dyspnea (Exercise) 2 0 2 2  --   Duration  --  --  -- Progress to 45 minutes of aerobic exercise without signs/symptoms of physical distress  --   Intensity  --  --  --  THRR unchanged  --     Progression   Progression  -- Continue to progress workloads to maintain intensity without signs/symptoms of physical distress. Continue to progress workloads to maintain intensity without signs/symptoms of physical distress. Continue to progress workloads to maintain intensity without signs/symptoms of physical distress.  --     Horticulturist, commercial Prescription Yes Yes Yes Yes  --   Weight '2 2 2 2  '$ --   Reps 10-12 10-12 10-12 10-12  --     Interval Training   Interval Training  -- No No No  --     Oxygen   Oxygen Continuous Continuous Continuous Continuous  --   Liters 3 2  added om treadmill only 2 2  --     Treadmill   MPH 1.5 1 1.1 1.5  --   Grade 0 0 0 0  --   Minutes 15  3/3/3/'3 15 15 15  '$ --   METs 2.15  --  -- 2.15  --     NuStep   Level  -- '1 2 4  '$ --   Minutes  -- '15 15 15  '$ --   METs  --  --  -- 2.5  --     REL-XR   Level '2 2 2 2  '$ --   Minutes '15 15 15 15  '$ --   METs 2 1.7  --  --  --     T5 Nustep   Level 1  --  --  --  --   Minutes 15  --  --  --  --   METs 2  --  --  --  --     Home Exercise Plan   Plans to continue exercise at  --  --  --  -- [employer]   Frequency  --  --  --  -- Add 2 additional days to program exercise sessions.   [place] Name 09/24/16 1200             Exercise Review   Progression Yes         Response to Exercise   Blood Pressure (Admit) 124/70       Blood Pressure (Exercise) 124/64       Blood Pressure (Exit) 140/68       Heart Rate  (Exercise) 93 bpm       Heart Rate (Exit) 83 bpm       Oxygen Saturation (Exercise) 92 %       Oxygen Saturation (Exit) 93 %       Rating of Perceived Exertion (Exercise) 12       Perceived Dyspnea (Exercise) 2       Duration Progress to 45 minutes of aerobic exercise without signs/symptoms of physical distress       Intensity THRR unchanged         Progression   Progression Continue to progress workloads to maintain intensity without signs/symptoms of physical distress.         Resistance Training   Training Prescription Yes       Weight 2       Reps 10-12         Interval Training   Interval Training No         Oxygen   Oxygen Continuous       Liters 2         Treadmill   MPH 1.4       Grade 0  Minutes 15       METs 2.07         NuStep   Level 4       Minutes 15       METs 2.2          Exercise Comments:     Exercise Comments    Row Name 07/14/16 1312 07/27/16 1218 07/30/16 1147 08/11/16 1519 08/26/16 1311   Exercise Comments Catherine Landry began the test on room air but droppped quickly too low to continue the test.  She completed the test on 3L continuous oxygen. First full day of exercise!  Patient was oriented to gym and equipment including functions, settings, policies, and procedures.  Patient's individual exercise prescription and treatment plan were reviewed.  All starting workloads were established based on the results of the 6 minute walk test done at initial orientation visit.  The plan for exercise progression was also introduced and progression will be customized based on patient's performance and goals. Catherine Landry 02 dropped to 21 on Tm so she has added 2L continuous oxygen on TM only.  O2 sats were 91 with oxygen. Catherine Landry is progressing well with exerccise. Catherine Landry is progressing well with exercise and has maintained normal )2 levels since adding 2L continuous during exercise.   [place] Name 09/02/16 1215 09/24/16 1244         Exercise Comments  Reviewed home  exercise with pt today.  Pt plans to walk/join [employer] for exercise.  Reviewed THR, pulse, RPE, sign and symptoms, NTG use, and when to call 911 or MD.  Also discussed weather considerations and indoor options.  Pt voiced understanding. Catherine Landry is doing well for nearing the midpoint of the program.  Staff will continue to monitor her progress.         Discharge Exercise Prescription (Final Exercise Prescription Changes):     Exercise Prescription Changes - 09/24/16 1200      Exercise Review   Progression Yes     Response to Exercise   Blood Pressure (Admit) 124/70   Blood Pressure (Exercise) 124/64   Blood Pressure (Exit) 140/68   Heart Rate (Exercise) 93 bpm   Heart Rate (Exit) 83 bpm   Oxygen Saturation (Exercise) 92 %   Oxygen Saturation (Exit) 93 %   Rating of Perceived Exertion (Exercise) 12   Perceived Dyspnea (Exercise) 2   Duration Progress to 45 minutes of aerobic exercise without signs/symptoms of physical distress   Intensity THRR unchanged     Progression   Progression Continue to progress workloads to maintain intensity without signs/symptoms of physical distress.     Resistance Training   Training Prescription Yes   Weight 2   Reps 10-12     Interval Training   Interval Training No     Oxygen   Oxygen Continuous   Liters 2     Treadmill   MPH 1.4   Grade 0   Minutes 15   METs 2.07     NuStep   Level 4   Minutes 15   METs 2.2       Nutrition:  Target Goals: Understanding of nutrition guidelines, daily intake of sodium '1500mg'$ , cholesterol '200mg'$ , calories 30% from fat and 7% or less from saturated fats, daily to have 5 or more servings of fruits and vegetables.  Biometrics:     Pre Biometrics - 07/14/16 1257      Pre Biometrics   Height 5' 2.5" (1.588 m)   Weight 147 lb  12.8 oz (67 kg)   Waist Circumference 33.5 inches   Hip Circumference 42.5 inches   Waist to Hip Ratio 0.79 %   BMI (Calculated) 26.7       Nutrition Therapy Plan  and Nutrition Goals:   Nutrition Discharge: Rate Your Plate Scores:   Psychosocial: Target Goals: Acknowledge presence or absence of depression, maximize coping skills, provide positive support system. Participant is able to verbalize types and ability to use techniques and skills needed for reducing stress and depression.  Initial Review & Psychosocial Screening:     Initial Psych Review & Screening - 07/14/16 [place]? Yes   Comments Catherine Landry has good support from he husband of 59yr. She has faced cancer twice and now COPD, but she has a very positive outlook on life. One concern is her husband's early stages of Alzheimers and his care. [place]     Barriers   Psychosocial barriers to participate in program The patient should benefit from training in stress management and relaxation.     Screening Interventions   Interventions Encouraged to exercise;Program counselor consult      Quality of Life Scores:     Quality of Life - 07/14/16 1307      Quality of Life Scores   Health/Function Pre 21 %   Socioeconomic Pre 21 %   Psych/Spiritual Pre 21 %   Family Pre 21 %   GLOBAL Pre 21 %      PHQ-9: Recent Review Flowsheet Data    [facility]   Decreased Interest 0   Down, Depressed, Hopeless 0   PHQ - 2 Score 0   Altered sleeping 0   Tired, decreased energy 1   Change in appetite 1   Feeling bad or failure about yourself  0   Trouble concentrating 0   Moving slowly or fidgety/restless 0   Suicidal thoughts 0   PHQ-9 Score 2   Difficult doing work/chores Not difficult at all      Psychosocial Evaluation and Intervention:     Psychosocial Evaluation - 07/27/16 1149      Psychosocial Evaluation & Interventions   Interventions Encouraged to exercise with the program and follow exercise prescription   Comments Counselor met with Catherine. MBurnett Landry(Catherine. MJerilynn Landry today for initial  psychosocial evaluation.  She is a 81year old who has been diagnosed with COPD.  She has a strong support system with a spouse of over 457years; (2) adult children who live closeby; and a sister and niece locally as well.  Catherine. MJerilynn Mageshas a healthy history of bladder cancer and [facility]  She also lost a kidney to cancer in 2000.  She reports sleeping well and having a good appetite.  She denies a history of depression or anxiety or any current symptoms. She states she is typically positive most of the time and has minimal stress in her life currently.  She has goals to walk better without being short of breath.  Staff will follow with Catherine. M throughout the course of this program.        Psychosocial Re-Evaluation:     Psychosocial Re-Evaluation    Row Name 08/12/16 1029 08/19/16 1135 09/11/16 1048 09/25/16 1102       Psychosocial Re-Evaluation   Interventions  --  --  -- Encouraged to attend  Pulmonary Rehabilitation for the exercise    Comments Counselor follow up with Catherine. M today reporting she is feeling stronger in general and has been breathing better overall; but was struggling some today with shortness of breath.  She continues to be in a positive mood although she has some stress with her spouse's health declining.  She is unable to go away for Thanksgiving with family but she and her spouse are joining friends at a [employer] and she seemed fine with that option.  Catherine. Jerilynn Landry states she is sleeping well and enjoying this program.  Staff will continue to follow with her.   Catherine. Jerilynn Landry reports having a good Thanksgiving with friends at a [employer] and her spouse was able to accompany her.  She is reporting more energy and ability to work in her yard now than before coming into this program.  Catherine. Jerilynn Landry also was happy to announce that she has not had to use her Nebulizer for the past two weeks and she was actively using it daily for the last few  years.  She is proud of her progress and is handling the stress of her spouses's declining health better as a result of her positive self-care.  Counselor commended Catherine. M for her progress made and her commitment to exercise and this program.   Catherine Landry reported her battery died so her husband took out her car battery to check it in his car. He passed out doing that and was admitted to the hospital. They still do not know why he passed out. Catherine Landry of course reported it was stressfull to find him passed out.  Catherine Landry reports doing well with her stress at this time.  She states her husband seems to be handling his mile alzheimer's better, which helps her handle it better as well.  "We just take care of each other", she says.  She is thankful he can still be left home by himself, which is helpful for her.  She continues to have family support, stating she can call her grandchildren anytime.  Catherine Landry is in good spirits and has a good outlook on life and on taking care of herself.     Continued Psychosocial Services Needed  --  --  -- Yes      Education: Education Goals: Education classes will be provided on a weekly basis, covering required topics. Participant will state understanding/return demonstration of topics presented.  Learning Barriers/Preferences:     Learning Barriers/Preferences - 07/14/16 1250      Learning Barriers/Preferences   Learning Barriers None   Learning Preferences None      Education Topics: Initial Evaluation Education: - Verbal, written and demonstration of respiratory meds, RPE/PD scales, oximetry and breathing techniques. Instruction on use of nebulizers and MDIs: cleaning and proper use, rinsing mouth with steroid doses and importance of monitoring MDI activations. Flowsheet Row Pulmonary Rehab from 09/23/2016 in [facility] Cardiac and Pulmonary Rehab  Date  07/14/16  Educator  LB  Instruction Review Code  2- meets goals/outcomes      General Nutrition Guidelines/Fats and Fiber: -Group  instruction provided by verbal, written material, models and posters to present the general guidelines for heart healthy nutrition. Gives an explanation and review of dietary fats and fiber. Flowsheet Row Pulmonary Rehab from 09/23/2016 in [facility] Cardiac and Pulmonary Rehab  Date  08/10/16  Educator  CR  Instruction Review Code  2- meets goals/outcomes      Controlling Sodium/Reading Food Labels: -Group verbal and written material  supporting the discussion of sodium use in heart healthy nutrition. Review and explanation with models, verbal and written materials for utilization of the food label. Flowsheet Row Pulmonary Rehab from 09/23/2016 in [facility] Cardiac and Pulmonary Rehab  Date  08/17/16  Educator  CR  Instruction Review Code  2- meets goals/outcomes      Exercise Physiology & Risk Factors: - Group verbal and written instruction with models to review the exercise physiology of the cardiovascular system and associated critical values. Details cardiovascular disease risk factors and the goals associated with each risk factor. Flowsheet Row Pulmonary Rehab from 09/23/2016 in [facility] Cardiac and Pulmonary Rehab  Date  08/05/16  Educator  AS [JH]  Instruction Review Code  2- meets goals/outcomes      Aerobic Exercise & Resistance Training: - Gives group verbal and written discussion on the health impact of inactivity. On the components of aerobic and resistive training programs and the benefits of this training and how to safely progress through these programs.   Flexibility, Balance, General Exercise Guidelines: - Provides group verbal and written instruction on the benefits of flexibility and balance training programs. Provides general exercise guidelines with specific guidelines to those with heart or lung disease. Demonstration and skill practice provided.   Stress Management: - Provides group verbal and written instruction about the health risks of elevated stress, cause of high stress,  and healthy ways to reduce stress.   Depression: - Provides group verbal and written instruction on the correlation between heart/lung disease and depressed mood, treatment options, and the stigmas associated with seeking treatment. Flowsheet Row Pulmonary Rehab from 09/23/2016 in [facility] Cardiac and Pulmonary Rehab  Date  08/24/16  Educator  [facility]  Instruction Review Code  2- meets goals/outcomes      Exercise & Equipment Safety: - Individual verbal instruction and demonstration of equipment use and safety with use of the equipment. Flowsheet Row Pulmonary Rehab from 09/23/2016 in [facility] Cardiac and Pulmonary Rehab  Date  07/27/16  Educator  LB  Instruction Review Code  2- meets goals/outcomes      Infection Prevention: - Provides verbal and written material to individual with discussion of infection control including proper hand washing and proper equipment cleaning during exercise session. Flowsheet Row Pulmonary Rehab from 09/23/2016 in [facility] Cardiac and Pulmonary Rehab  Date  07/27/16  Educator  LB  Instruction Review Code  2- meets goals/outcomes      Falls Prevention: - Provides verbal and written material to individual with discussion of falls prevention and safety. Flowsheet Row Pulmonary Rehab from 09/23/2016 in [facility] Cardiac and Pulmonary Rehab  Date  07/14/16  Educator  LB  Instruction Review Code  2- meets goals/outcomes      Diabetes: - Individual verbal and written instruction to review signs/symptoms of diabetes, desired ranges of glucose level fasting, after meals and with exercise. Advice that pre and post exercise glucose checks will be done for 3 sessions at entry of program.   Chronic Lung Diseases: - Group verbal and written instruction to review new updates, new respiratory medications, new advancements in procedures and treatments. Provide informative websites and "800" numbers of self-education.   Lung Procedures: - Group verbal and written instruction to  describe testing methods done to diagnose lung disease. Review the outcome of test results. Describe the treatment choices: Pulmonary Function Tests, ABGs and oximetry.   Energy Conservation: - Provide group verbal and written instruction for methods to conserve energy, plan and organize activities. Instruct on pacing techniques, use  of adaptive equipment and posture/positioning to relieve shortness of breath.   Triggers: - Group verbal and written instruction to review types of environmental controls: home humidity, furnaces, filters, dust mite/pet prevention, HEPA vacuums. To discuss weather changes, air quality and the benefits of nasal washing.   Exacerbations: - Group verbal and written instruction to provide: warning signs, infection symptoms, calling MD promptly, preventive modes, and value of vaccinations. Review: effective airway clearance, coughing and/or vibration techniques. Create an Sports administrator. Flowsheet Row Pulmonary Rehab from 09/23/2016 in [facility] Cardiac and Pulmonary Rehab  Date  08/26/16  Educator  LB  Instruction Review Code  2- meets goals/outcomes      Oxygen: - Individual and group verbal and written instruction on oxygen therapy. Includes supplement oxygen, available portable oxygen systems, continuous and intermittent flow rates, oxygen safety, concentrators, and Medicare reimbursement for oxygen. Flowsheet Row Pulmonary Rehab from 09/23/2016 in [facility] Cardiac and Pulmonary Rehab  Date  07/14/16  Educator  LB  Instruction Review Code  2- meets goals/outcomes      Respiratory Medications: - Group verbal and written instruction to review medications for lung disease. Drug class, frequency, complications, importance of spacers, rinsing mouth after steroid MDI's, and proper cleaning methods for nebulizers. Flowsheet Row Pulmonary Rehab from 09/23/2016 in [facility] Cardiac and Pulmonary Rehab  Date  07/14/16  Educator  LB  Instruction Review Code  2- meets goals/outcomes       AED/CPR: - Group verbal and written instruction with the use of models to demonstrate the basic use of the AED with the basic ABC's of resuscitation. Flowsheet Row Pulmonary Rehab from 09/23/2016 in [facility] Cardiac and Pulmonary Rehab  Date  07/31/16  Educator  C. EnterkinRN  Instruction Review Code  2- meets goals/outcomes      Breathing Retraining: - Provides individuals verbal and written instruction on purpose, frequency, and proper technique of diaphragmatic breathing and pursed-lipped breathing. Applies individual practice skills. Flowsheet Row Pulmonary Rehab from 09/23/2016 in [facility] Cardiac and Pulmonary Rehab  Date  07/27/16  Educator  LB  Instruction Review Code  2- meets goals/outcomes      Anatomy and Physiology of the Lungs: - Group verbal and written instruction with the use of models to provide basic lung anatomy and physiology related to function, structure and complications of lung disease. Flowsheet Row Pulmonary Rehab from 09/23/2016 in [facility] Cardiac and Pulmonary Rehab  Date  08/19/16  Educator  LB  Instruction Review Code  2- meets goals/outcomes      Heart Failure: - Group verbal and written instruction on the basics of heart failure: signs/symptoms, treatments, explanation of ejection fraction, enlarged heart and cardiomyopathy. Flowsheet Row Pulmonary Rehab from 09/23/2016 in [facility] Cardiac and Pulmonary Rehab  Date  09/04/16  Educator  SB  Instruction Review Code  2- meets goals/outcomes      Sleep Apnea: - Individual verbal and written instruction to review Obstructive Sleep Apnea. Review of risk factors, methods for diagnosing and types of masks and machines for OSA.   Anxiety: - Provides group, verbal and written instruction on the correlation between heart/lung disease and anxiety, treatment options, and management of anxiety.   Relaxation: - Provides group, verbal and written instruction about the benefits of relaxation for patients with heart/lung  disease. Also provides patients with examples of relaxation techniques.   Knowledge Questionnaire Score:     Knowledge Questionnaire Score - 07/14/16 1250      Knowledge Questionnaire Score   Pre Score 8/10  Core Components/Risk Factors/Patient Goals at Admission:     Personal Goals and Risk Factors at Admission - 07/14/16 1257      Core Components/Risk Factors/Patient Goals on Admission   Sedentary Yes  Likes to walk and camp   Intervention Provide advice, education, support and counseling about physical activity/exercise needs.;Develop an individualized exercise prescription for aerobic and resistive training based on initial evaluation findings, risk stratification, comorbidities and participant's personal goals.   Expected Outcomes Achievement of increased cardiorespiratory fitness and enhanced flexibility, muscular endurance and strength shown through measurements of functional capacity and personal statement of participant.   Increase Strength and Stamina Yes   Intervention Provide advice, education, support and counseling about physical activity/exercise needs.;Develop an individualized exercise prescription for aerobic and resistive training based on initial evaluation findings, risk stratification, comorbidities and participant's personal goals.   Expected Outcomes Achievement of increased cardiorespiratory fitness and enhanced flexibility, muscular endurance and strength shown through measurements of functional capacity and personal statement of participant.   Improve shortness of breath with ADL's Yes   Intervention Provide education, individualized exercise plan and daily activity instruction to help decrease symptoms of SOB with activities of daily living.   Expected Outcomes Short Term: Achieves a reduction of symptoms when performing activities of daily living.   Develop more efficient breathing techniques such as purse lipped breathing and diaphragmatic breathing;  and practicing self-pacing with activity Yes   Intervention Provide education, demonstration and support about specific breathing techniuqes utilized for more efficient breathing. Include techniques such as pursed lipped breathing, diaphragmatic breathing and self-pacing activity.   Expected Outcomes Short Term: Participant will be able to demonstrate and use breathing techniques as needed throughout daily activities.   Increase knowledge of respiratory medications and ability to use respiratory devices properly  Yes  Advair, Combivent Respimat, SVN Albuterol; oxygen 2l/m at night and as needed   Intervention Provide education and demonstration as needed of appropriate use of medications, inhalers, and oxygen therapy.   Expected Outcomes Short Term: Achieves understanding of medications use. Understands that oxygen is a medication prescribed by physician. Demonstrates appropriate use of inhaler and oxygen therapy.   Hypertension Yes   Intervention Provide education on lifestyle modifcations including regular physical activity/exercise, weight management, moderate sodium restriction and increased consumption of fresh fruit, vegetables, and low fat dairy, alcohol moderation, and smoking cessation.;Monitor prescription use compliance.   Expected Outcomes Short Term: Continued assessment and intervention until BP is < 140/54m HG in hypertensive participants. < 130/861mHG in hypertensive participants with diabetes, heart failure or chronic kidney disease.;Long Term: Maintenance of blood pressure at goal levels.      Core Components/Risk Factors/Patient Goals Review:      Goals and Risk Factor Review    Row Name 07/27/16 1207 08/10/16 0849 08/24/16 1532 09/25/16 1107       Core Components/Risk Factors/Patient Goals Review   Personal Goals Review Develop more efficient breathing techniques such as purse lipped breathing and diaphragmatic breathing and practicing self-pacing with activity.  Sedentary;Increase Strength and Stamina;Improve shortness of breath with ADL's;Increase knowledge of respiratory medications and ability to use respiratory devices properly.;Develop more efficient breathing techniques such as purse lipped breathing and diaphragmatic breathing and practicing self-pacing with activity.;Hypertension Sedentary;Increase Strength and Stamina;Improve shortness of breath with ADL's;Increase knowledge of respiratory medications and ability to use respiratory devices properly.;Develop more efficient breathing techniques such as purse lipped breathing and diaphragmatic breathing and practicing self-pacing with activity.;Hypertension Sedentary;Increase Strength and Stamina;Develop more efficient breathing techniques such as purse  lipped breathing and diaphragmatic breathing and practicing self-pacing with activity.;Improve shortness of breath with ADL's;Increase knowledge of respiratory medications and ability to use respiratory devices properly.;Hypertension    Review Pursed lip breathing techniques were explained and discussed with the patient. The patient demonstrated understanding of this breathing technique and when to use it during exercise and ADL's.  Catherine Landry has increased her exercise goals in [place]. She has oxygen at home for sleep, but she has not used it during the day. Walking on the treadmill has been difficult for Catherine Landry with O2Sat's in the mid 80's, so we have added 2-3l/m of oxygen for O2Sat's in the low 90's. Shopping and some housework make tired, so I have encouraged her to check her O2Sat's to check if they are low and if she should be wearing oxyge, pacing.Catherine Landry has a good understaning of her MDI's and has acceptable BP readings in [facility]. Catherine Landry continues to maintain acceptable BP during [facility]. She is compliant with her MDI's. Recently she felt an exacerbation coming on and immediately called Dr Ashby Dawes who called in a prescription for an  antibiotic and prednisone taper. During the treadmill, Catherine Landry is using oxygen to maintain acceptable O2Sat's in the 90's and will also use it with her other exercise goals if needed. Her PLB technique is helpful for activity in [facility] and at home.  Catherine Landry continues to maintain BP WNL here at [employer] and continues to be compliant with her MDI's.  She states she has seen an improvement in her ability to complete ADL's and her ability to do more activity with less O2 use.  She also finds herself using her rescue inhaler less now after starting [facility].  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  Continuing to practice pursed-lip-breathing in class and at home.      Expected Outcomes Patient will use PLB during exercise and ADL's to help improve oxygen saturations and control SOB.   -- Continue managing her COPD and using oxygen to maintain acceptable O2Sat's. Continue to manage her COPD and using O2 to maintain acceptable O2 saturations.         Core Components/Risk Factors/Patient Goals at Discharge (Final Review):      Goals and Risk Factor Review - 09/25/16 1107      Core Components/Risk Factors/Patient Goals Review   Personal Goals Review Sedentary;Increase Strength and Stamina;Develop more efficient breathing techniques such as purse lipped breathing and diaphragmatic breathing and practicing self-pacing with activity.;Improve shortness of breath with ADL's;Increase knowledge of respiratory medications and ability to use respiratory devices properly.;Hypertension   Review Catherine Landry continues to maintain BP WNL here at [facility] and continues to be compliant with her MDI's.  She states she has seen an improvement in her ability to complete ADL's and her ability to do more activity with less O2 use.  She also finds herself using her rescue inhaler less now after starting  [facility].  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  Continuing to practice pursed-lip-breathing in class and at home.     Expected Outcomes Continue to manage her COPD and using O2 to maintain acceptable O2 saturations.        ITP  Comments:     ITP Comments    Row Name 08/21/16 1100 08/31/16 1525 09/09/16 0805 09/11/16 1049 09/30/16 1157   ITP Comments "Know Your Numbers" education was completed today with learning goals met.  Pt able to follow exercise prescription today without complaint.  Will continue to monitor for progression. Catherine Landry did meet with Dr Emily Filbert for her face-to-face encounter.  Catherine Landry called and will be out for the week. Her husband had a fall and is in the hospital. Catherine Landry reported her battery died so her husband took out her car battery to check it in his car. He passed out doing that and was admitted to the hospital. They still do not know why he passed out to go see his Cardiologist. Catherine Landry of course reported it was stressfull to find him passed out. Face to face meeting with Dr. Sabra Heck today.      Comments: 30 day note review

## 2016-10-12 ENCOUNTER — Encounter: Payer: Medicare Other | Admitting: Respiratory Therapy

## 2016-10-12 DIAGNOSIS — J439 Emphysema, unspecified: Secondary | ICD-10-CM

## 2016-10-12 DIAGNOSIS — J438 Other emphysema: Secondary | ICD-10-CM | POA: Diagnosis not present

## 2016-10-13 NOTE — Progress Notes (Signed)
Daily Session Note  Patient Details  Name: Catherine Landry MRN: [id number] Date of Birth: 09-18-1936 Referring Provider:   Flowsheet Row Pulmonary Rehab from 07/14/2016 in [facility] Cardiac and Pulmonary Rehab  Referring Provider  Ramachandran      Encounter Date: 10/12/2016  Check In:     Session Check In - 10/13/16 1049      Check-In   Location [facility]-Cardiac & Pulmonary Rehab   Staff Present Carson Myrtle, BS, RRT, Respiratory Therapist;Kelly Amedeo Plenty, BS, ACSM CEP, Exercise Physiologist;Amanda Oletta Darter, BA, ACSM CEP, Exercise Physiologist   Supervising physician immediately available to respond to emergencies [facility] immediately available ER MD   Physician(s) Mariea Clonts and Marcelene Butte   Medication changes reported     No   Fall or balance concerns reported    No   Warm-up and Cool-down Performed on first and last piece of equipment   Resistance Training Performed Yes   VAD Patient? No     Pain Assessment   Currently in Pain? No/denies   Multiple Pain Sites No         Goals Met:  Proper associated with RPD/PD & O2 Sat Independence with exercise equipment Using PLB without cueing & demonstrates good technique Exercise tolerated well No report of cardiac concerns or symptoms Strength training completed today  Goals Unmet:  Not Applicable  Comments: Pt able to follow exercise prescription today without complaint.  Will continue to monitor for progression.   Dr. Emily Filbert is Medical Director for [facility] and [facility] Pulmonary Rehabilitation.

## 2016-10-15 ENCOUNTER — Ambulatory Visit
Admission: RE | Admit: 2016-10-15 | Discharge: 2016-10-15 | Disposition: A | Discharge status: Home / Self Care | Payer: Medicare Other | Source: Ambulatory Visit | Attending: Gastroenterology | Admitting: Gastroenterology

## 2016-10-15 ENCOUNTER — Ambulatory Visit: Payer: Medicare Other | Admitting: Anesthesiology

## 2016-10-15 ENCOUNTER — Encounter
Admission: RE | Disposition: A | Discharge status: Home / Self Care | Payer: Self-pay | Source: Ambulatory Visit | Attending: Gastroenterology

## 2016-10-15 DIAGNOSIS — Z79899 Other long term (current) drug therapy: Secondary | ICD-10-CM | POA: Diagnosis not present

## 2016-10-15 DIAGNOSIS — K224 Dyskinesia of esophagus: Secondary | ICD-10-CM | POA: Insufficient documentation

## 2016-10-15 DIAGNOSIS — K640 First degree hemorrhoids: Secondary | ICD-10-CM | POA: Insufficient documentation

## 2016-10-15 DIAGNOSIS — K210 Gastro-esophageal reflux disease with esophagitis: Secondary | ICD-10-CM | POA: Insufficient documentation

## 2016-10-15 DIAGNOSIS — R1310 Dysphagia, unspecified: Secondary | ICD-10-CM | POA: Diagnosis not present

## 2016-10-15 DIAGNOSIS — J449 Chronic obstructive pulmonary disease, unspecified: Secondary | ICD-10-CM | POA: Diagnosis not present

## 2016-10-15 DIAGNOSIS — K228 Other specified diseases of esophagus: Secondary | ICD-10-CM | POA: Diagnosis not present

## 2016-10-15 DIAGNOSIS — Z87891 Personal history of nicotine dependence: Secondary | ICD-10-CM | POA: Insufficient documentation

## 2016-10-15 DIAGNOSIS — Z7982 Long term (current) use of aspirin: Secondary | ICD-10-CM | POA: Insufficient documentation

## 2016-10-15 DIAGNOSIS — Z8551 Personal history of malignant neoplasm of bladder: Secondary | ICD-10-CM | POA: Diagnosis not present

## 2016-10-15 DIAGNOSIS — Z85528 Personal history of other malignant neoplasm of kidney: Secondary | ICD-10-CM | POA: Insufficient documentation

## 2016-10-15 DIAGNOSIS — I10 Essential (primary) hypertension: Secondary | ICD-10-CM | POA: Diagnosis not present

## 2016-10-15 DIAGNOSIS — Z1211 Encounter for screening for malignant neoplasm of colon: Secondary | ICD-10-CM | POA: Insufficient documentation

## 2016-10-15 DIAGNOSIS — K2980 Duodenitis without bleeding: Secondary | ICD-10-CM | POA: Insufficient documentation

## 2016-10-15 DIAGNOSIS — K5730 Diverticulosis of large intestine without perforation or abscess without bleeding: Secondary | ICD-10-CM | POA: Diagnosis not present

## 2016-10-15 DIAGNOSIS — K2950 Unspecified chronic gastritis without bleeding: Secondary | ICD-10-CM | POA: Insufficient documentation

## 2016-10-15 DIAGNOSIS — K2210 Ulcer of esophagus without bleeding: Secondary | ICD-10-CM | POA: Insufficient documentation

## 2016-10-15 DIAGNOSIS — K449 Diaphragmatic hernia without obstruction or gangrene: Secondary | ICD-10-CM | POA: Diagnosis not present

## 2016-10-15 HISTORY — DX: Raynaud's syndrome without gangrene: I73.00

## 2016-10-15 HISTORY — PX: ESOPHAGOGASTRODUODENOSCOPY (EGD) WITH PROPOFOL: SHX5813

## 2016-10-15 HISTORY — DX: Chronic obstructive pulmonary disease, unspecified: J44.9

## 2016-10-15 HISTORY — PX: COLONOSCOPY WITH PROPOFOL: SHX5780

## 2016-10-15 SURGERY — COLONOSCOPY WITH PROPOFOL
Anesthesia: General

## 2016-10-15 MED ORDER — FENTANYL CITRATE (PF) 100 MCG/2ML IJ SOLN
INTRAMUSCULAR | Status: AC
  Filled 2016-10-15: qty 2

## 2016-10-15 MED ORDER — SODIUM CHLORIDE 0.9 % IV SOLN
INTRAVENOUS | Status: DC
  Administered 2016-10-15: 08:00:00 via INTRAVENOUS

## 2016-10-15 MED ORDER — DEXAMETHASONE SODIUM PHOSPHATE 4 MG/ML IJ SOLN
INTRAMUSCULAR | Status: DC | PRN
  Administered 2016-10-15: 10 mg via INTRAVENOUS

## 2016-10-15 MED ORDER — IPRATROPIUM-ALBUTEROL 0.5-2.5 (3) MG/3ML IN SOLN: 3.0000 mL | Freq: Four times a day (QID) | RESPIRATORY_TRACT | Status: DC

## 2016-10-15 MED ORDER — PHENYLEPHRINE HCL 10 MG/ML IJ SOLN
INTRAMUSCULAR | Status: DC | PRN
  Administered 2016-10-15 (×7): 100 ug via INTRAVENOUS

## 2016-10-15 MED ORDER — PROPOFOL 500 MG/50ML IV EMUL
INTRAVENOUS | Status: AC
  Filled 2016-10-15: qty 50

## 2016-10-15 MED ORDER — PROPOFOL 10 MG/ML IV BOLUS
INTRAVENOUS | Status: DC | PRN
  Administered 2016-10-15: 100 mg via INTRAVENOUS

## 2016-10-15 MED ORDER — MIDAZOLAM HCL 2 MG/2ML IJ SOLN
INTRAMUSCULAR | Status: AC
  Filled 2016-10-15: qty 2

## 2016-10-15 MED ORDER — PROPOFOL 10 MG/ML IV BOLUS
INTRAVENOUS | Status: AC
  Filled 2016-10-15: qty 20

## 2016-10-15 MED ORDER — PROPOFOL 500 MG/50ML IV EMUL
INTRAVENOUS | Status: DC | PRN
  Administered 2016-10-15: 140 ug/kg/min via INTRAVENOUS

## 2016-10-15 MED ORDER — SODIUM CHLORIDE 0.9 % IV SOLN: INTRAVENOUS | Status: DC

## 2016-10-15 MED ORDER — IPRATROPIUM-ALBUTEROL 0.5-2.5 (3) MG/3ML IN SOLN
RESPIRATORY_TRACT | Status: AC
  Administered 2016-10-15: 3 mL
  Filled 2016-10-15: qty 3

## 2016-10-15 MED ORDER — FENTANYL CITRATE (PF) 100 MCG/2ML IJ SOLN
INTRAMUSCULAR | Status: DC | PRN
  Administered 2016-10-15: 50 ug via INTRAVENOUS

## 2016-10-15 MED ORDER — LIDOCAINE HCL (PF) 2 % IJ SOLN
INTRAMUSCULAR | Status: AC
  Filled 2016-10-15: qty 2

## 2016-10-15 MED ORDER — MIDAZOLAM HCL 5 MG/5ML IJ SOLN
INTRAMUSCULAR | Status: DC | PRN
  Administered 2016-10-15: 1 mg via INTRAVENOUS

## 2016-10-15 MED ORDER — DEXAMETHASONE SODIUM PHOSPHATE 10 MG/ML IJ SOLN
INTRAMUSCULAR | Status: AC
  Filled 2016-10-15: qty 1

## 2016-10-15 MED ORDER — LIDOCAINE 2% (20 MG/ML) 5 ML SYRINGE
INTRAMUSCULAR | Status: DC | PRN
  Administered 2016-10-15: 40 mg via INTRAVENOUS

## 2016-10-15 NOTE — Op Note (Signed)
[facility] Gastroenterology Patient Name: Catherine Landry Procedure Date: 10/15/2016 8:25 AM MRN: [id number] Account #: [id number] Date of Birth: 1936/06/16 Admit Type: Outpatient Age: 81 Room: [facility] ENDO ROOM 1 Gender: Female Note Status: Finalized Procedure:            Upper GI endoscopy Indications:          Dysphagia Providers:            Lollie Sails, MD Referring MD:         Tracie Harrier, MD (Referring MD) Medicines:            Monitored Anesthesia Care Complications:        No immediate complications. Procedure:            Pre-Anesthesia Assessment:                       - ASA Grade Assessment: III - A patient with severe                        systemic disease.                       After obtaining informed consent, the endoscope was                        passed under direct vision. Throughout the procedure,                        the patient's blood pressure, pulse, and oxygen                        saturations were monitored continuously. The Endoscope                        was introduced through the mouth, and advanced to the                        third part of duodenum. The upper GI endoscopy was                        accomplished without difficulty. The patient tolerated                        the procedure well. Findings:      Abnormal motility was noted in the middle third of the esophagus and in       the lower third of the esophagus. The cricopharyngeus was normal. There       are extra peristaltic waves in the esophageal body. Tertiary peristaltic       waves are noted.      A medium-sized sliding hiatal hernia was present.      Patchy moderate inflammation characterized by adherent blood, congestion       (edema), erosions and erythema was found in the gastric body and in the       gastric antrum. Biopsies were taken with a cold forceps for histology.       Biopsies were taken with a cold forceps for Helicobacter pylori testing.      The examined duodenum was normal.      The cardia and gastric fundus were normal on retroflexion otherwise.      Localized moderate  mucosal variance characterized by altered texture was       found at the gastroesophageal junction. Biopsies were taken with a cold       forceps for histology.      LA Grade B (one or more mucosal breaks greater than 5 mm, not extending       between the tops of two mucosal folds) esophagitis with no bleeding was       found. Biopsies were taken with a cold forceps for histology. Impression:           - Abnormal esophageal motility, consistent with                        presbyesophagus.                       - Medium-sized hiatal hernia.                       - Erosive gastritis. Biopsied.                       - Normal examined duodenum.                       - Esophageal mucosal variant. Biopsied.                       - LA Grade B erosive esophagitis. Biopsied. Recommendation:       - Await pathology results.                       - Use Aciphex (rabeprazole) 20 mg PO daily daily.                       - Await pathology results.                       - Perform a modified barium swallow at appointment to                        be scheduled.                       - Check scl-70/anticentromere antibody today.                       - Return to GI clinic in 4 weeks. Procedure Code(s):    --- Professional ---                       [phone], Esophagogastroduodenoscopy, flexible, transoral;                        with biopsy, single or multiple Diagnosis Code(s):    --- Professional ---                       K22.4, Dyskinesia of esophagus                       K44.9, Diaphragmatic hernia without obstruction or                        gangrene  K29.60, Other gastritis without bleeding                       K22.8, Other specified diseases of esophagus                       K20.8, Other esophagitis                       R13.10, Dysphagia,  unspecified CPT copyright 2016 American Medical Association. All rights reserved. The codes documented in this report are preliminary and upon coder review may  be revised to meet current compliance requirements. Lollie Sails, MD 10/15/2016 8:53:25 AM This report has been signed electronically. Number of Addenda: 0 Note Initiated On: 10/15/2016 8:25 AM      [facility]

## 2016-10-15 NOTE — Anesthesia Postprocedure Evaluation (Signed)
Anesthesia Post Note  [name]  Procedure(s) Performed: Procedure(s) (LRB): COLONOSCOPY WITH PROPOFOL (N/A) ESOPHAGOGASTRODUODENOSCOPY (EGD) WITH PROPOFOL (N/A)  Patient location during evaluation: Endoscopy Anesthesia Type: General Level of consciousness: awake and alert Pain management: pain level controlled Vital Signs Assessment: post-procedure vital signs reviewed and stable Respiratory status: spontaneous breathing, nonlabored ventilation, respiratory function stable and patient connected to nasal cannula oxygen Cardiovascular status: blood pressure returned to baseline and stable Postop Assessment: no signs of nausea or vomiting Anesthetic complications: no     Last Vitals:  Vitals:   10/15/16 0930 10/15/16 0940  BP: 110/66 137/69  Pulse:  83  Resp:  18  Temp:      Last Pain: There were no vitals filed for this visit.               Precious Haws Piscitello

## 2016-10-15 NOTE — Op Note (Signed)
[facility] Gastroenterology Patient Name: Catherine Landry Procedure Date: 10/15/2016 8:24 AM MRN: [id number] Account #: [id number] Date of Birth: 10-29-1935 Admit Type: Outpatient Age: 81 Room: [facility] ENDO ROOM 1 Gender: Female Note Status: Finalized Procedure:            Colonoscopy Indications:          Screening for colorectal malignant neoplasm Providers:            Lollie Sails, MD Referring MD:         Tracie Harrier, MD (Referring MD) Medicines:            Monitored Anesthesia Care Complications:        No immediate complications. Procedure:            Pre-Anesthesia Assessment:                       - ASA Grade Assessment: III - A patient with severe                        systemic disease.                       After obtaining informed consent, the colonoscope was                        passed under direct vision. Throughout the procedure,                        the patient's blood pressure, pulse, and oxygen                        saturations were monitored continuously. The                        Colonoscope was introduced through the anus with the                        intention of advancing to the cecum. The scope was                        advanced to the sigmoid colon before the procedure was                        aborted. Medications were given. The colonoscopy was                        extremely difficult due to restricted mobility of the                        colon. Successful completion of the procedure was aided                        by {skip}. The patient tolerated the procedure well.                        The quality of the bowel preparation was good. Findings:      Multiple small and large-mouthed diverticula were found in the mid       sigmoid colon and distal sigmoid colon. At about 35 cm from the anal  verge the colon was very tortuous and felt "tethered", and I could not       pass the scope further despite position  change and abdominal support.      The digital rectal exam was normal.      Non-bleeding internal hemorrhoids were found during anoscopy. The       hemorrhoids were small and Grade I (internal hemorrhoids that do not       prolapse). Impression:           - Diverticulosis in the mid sigmoid colon and in the                        distal sigmoid colon.                       - Non-bleeding internal hemorrhoids.                       - No specimens collected. Recommendation:       - consider double contrast ABCE versus CT colonography                        for further evaluation Procedure Code(s):    --- Professional ---                       [phone], 41, Colonoscopy, flexible; diagnostic, including                        collection of specimen(s) by brushing or washing, when                        performed (separate procedure) Diagnosis Code(s):    --- Professional ---                       Z12.11, Encounter for screening for malignant neoplasm                        of colon                       K64.0, First degree hemorrhoids                       K57.30, Diverticulosis of large intestine without                        perforation or abscess without bleeding CPT copyright 2016 American Medical Association. All rights reserved. The codes documented in this report are preliminary and upon coder review may  be revised to meet current compliance requirements. Lollie Sails, MD 10/15/2016 9:22:44 AM This report has been signed electronically. Number of Addenda: 0 Note Initiated On: 10/15/2016 8:24 AM Total Procedure Duration: 0 hours 20 minutes 47 seconds       [facility]

## 2016-10-15 NOTE — Transfer of Care (Signed)
Immediate Anesthesia Transfer of Care Note  [name]  Procedure(s) Performed: Procedure(s): COLONOSCOPY WITH PROPOFOL (N/A) ESOPHAGOGASTRODUODENOSCOPY (EGD) WITH PROPOFOL (N/A)  Patient Location: PACU and Endoscopy Unit  Anesthesia Type:General  Level of Consciousness: awake, oriented and patient cooperative  Airway & Oxygen Therapy: Patient Spontanous Breathing and Patient connected to nasal cannula oxygen  Post-op Assessment: Report given to RN and Post -op Vital signs reviewed and stable  Post vital signs: Reviewed and stable  Last Vitals:  Vitals:   10/15/16 0751  BP: 128/68  Pulse: (!) 101  Resp: (!) 22  Temp: 36.1 C    Last Pain: There were no vitals filed for this visit.       Complications: No apparent anesthesia complications

## 2016-10-15 NOTE — H&P (Signed)
Outpatient short stay form Pre-procedure 10/15/2016 8:19 AM Lollie Sails MD  Primary Physician: Dr Tracie Harrier  Reason for visit:  EGD and colonoscopy  History of present illness:  Patient is a 81 year old female presenting today as above. She has a history of dysphagia to both liquids and solids. He will occasionally regurgitate foods. She does take result however usually only once a week. She does have a history of more pronounced raynaud syndrome. She tolerated her prep well. She takes 81 mg aspirin last time yesterday. She takes no other aspirin or blood thinning agents.    Current Facility-Administered Medications:  .  0.9 %  sodium chloride infusion, , Intravenous, Continuous, Lollie Sails, MD .  0.9 %  sodium chloride infusion, , Intravenous, Continuous, Lollie Sails, MD, Last Rate: 20 mL/hr at 10/15/16 0803 .  0.9 %  sodium chloride infusion, , Intravenous, Continuous, Lollie Sails, MD .  ipratropium-albuterol (DUONEB) 0.5-2.5 (3) MG/3ML nebulizer solution 3 mL, 3 mL, Nebulization, Q6H, Andria Frames, MD  Prescriptions Prior to Admission  Medication Sig Dispense Refill Last Dose  . ADVAIR DISKUS 250-50 MCG/DOSE AEPB Inhale 1 puff into the lungs 2 (two) times daily. *Rinse mouth after use*  1 10/15/2016 at Unknown time  . albuterol (PROVENTIL) (2.5 MG/3ML) 0.083% nebulizer solution Take 3-6 mLs by nebulization every 6 (six) hours as needed. For shortness of breath/wheezing.  5 Past Week at Unknown time  . aspirin EC 81 MG tablet Take 81 mg by mouth every morning.   10/14/2016 at Unknown time  . Calcium-Magnesium-Vitamin D (CALCIUM 1200+D3 PO) Take 1 tablet by mouth every morning.   Past Week at Unknown time  . metoprolol succinate (TOPROL-XL) 25 MG 24 hr tablet Take 25 mg by mouth every morning.   10/15/2016 at Unknown time  . Multiple Vitamin (MULTIVITAMIN WITH MINERALS) TABS Take 1 tablet by mouth every morning.   10/15/2016 at Unknown time  .  NEOMYCIN-POLYMYXIN-HYDROCORTISONE (CORTISPORIN) 1 % SOLN otic solution Apply 1-2 drops to toe BID after soaking 10 mL 1 Past Week at Unknown time  . omeprazole (PRILOSEC) 40 MG capsule Take 40 mg by mouth 3 (three) times a week.   5 Past Month at Unknown time  . polyethylene glycol powder (GLYCOLAX/MIRALAX) powder Take 17 g by mouth daily as needed for constipation. Mix with 4 to 8 ounces of fluid.   Past Week at Unknown time  . theophylline (THEO-24) 300 MG 24 hr capsule Take 1 capsule (300 mg total) by mouth every morning. 30 capsule 0 10/14/2016 at Unknown time  . bisacodyl (DULCOLAX) 5 MG EC tablet Take 1 tablet (5 mg total) by mouth daily as needed for moderate constipation. (Patient not taking: Reported on 10/15/2016) 30 tablet 0 Not Taking at Unknown time  . COMBIVENT RESPIMAT 20-100 MCG/ACT AERS respimat Inhale 1 puff into the lungs 4 (four) times daily as needed. For COPD.  5 Taking  . dicyclomine (BENTYL) 10 MG capsule Take 10 mg by mouth 2 (two) times daily as needed for muscle spasms. For up to 10 days   04/24/2016 at Unknown time  . losartan (COZAAR) 50 MG tablet Take 50 mg by mouth every morning.   Taking  . predniSONE (DELTASONE) 10 MG tablet Start with 60mg  daily and decrease by 10mg  daily (Patient not taking: Reported on 10/15/2016) 21 tablet 0 Completed Course at Unknown time  . predniSONE (STERAPRED UNI-PAK 21 TAB) 10 MG (21) TBPK tablet Take 6 tablets on Day 1, then  5-4-3-2-1 until complete (Patient not taking: Reported on 10/15/2016) 21 tablet 0 Completed Course at Unknown time     No Known Allergies   Past Medical History:  Diagnosis Date  . Asthma   . Bladder cancer ([place]) 2015  . Cancer [facility] [place]) 2001   kidney left   . COPD (chronic obstructive pulmonary disease) ([place])   . Hypertension   . Raynaud's disease   . Renal insufficiency     Review of systems:      Physical Exam    Heart and lungs: Regular rate and rhythm without rub or gallop, lungs are bilaterally clear.     HEENT: Normocephalic atraumatic eyes are anicteric    Other:     Pertinant exam for procedure: Soft nontender nondistended bowel sounds positive normoactive. There is a bit of a fullness in the right upper quadrant.    Planned proceedures: EGD, colonoscopy and indicated procedures. I have discussed the risks benefits and complications of procedures to include not limited to bleeding, infection, perforation and the risk of sedation and the patient wishes to proceed.    Lollie Sails, MD Gastroenterology 10/15/2016  8:19 AM

## 2016-10-15 NOTE — Anesthesia Post-op Follow-up Note (Cosign Needed)
Anesthesia QCDR form completed.        

## 2016-10-15 NOTE — Anesthesia Preprocedure Evaluation (Signed)
Anesthesia Evaluation  Patient identified by MRN, date of birth, ID band Patient awake    Reviewed: Allergy & Precautions, H&P , NPO status , Patient's Chart, lab work & pertinent test results  History of Anesthesia Complications Negative for: history of anesthetic complications  Airway Mallampati: III  TM Distance: <3 FB Neck ROM: limited    Dental  (+) Poor Dentition, Chipped, Missing, Upper Dentures   Pulmonary shortness of breath and with exertion, asthma , COPD, former smoker,    Pulmonary exam normal breath sounds clear to auscultation       Cardiovascular Exercise Tolerance: Poor hypertension, (-) angina+ Peripheral Vascular Disease and + DOE  (-) Past MI Normal cardiovascular exam Rhythm:regular Rate:Normal     Neuro/Psych negative neurological ROS  negative psych ROS   GI/Hepatic negative GI ROS, Neg liver ROS,   Endo/Other  negative endocrine ROS  Renal/GU Renal disease  negative genitourinary   Musculoskeletal   Abdominal   Peds  Hematology negative hematology ROS (+)   Anesthesia Other Findings Past Medical History: No date: Asthma 2015: Bladder cancer ([place]) 2001: Cancer ([place])     Comment: kidney left  No date: COPD (chronic obstructive pulmonary disease) (* No date: Hypertension No date: Raynaud's disease No date: Renal insufficiency  Past Surgical History: No date: APPENDECTOMY No date: BACK SURGERY No date: CATARACT EXTRACTION No date: NEPHRECTOMY     Reproductive/Obstetrics negative OB ROS                             Anesthesia Physical Anesthesia Plan  ASA: III  Anesthesia Plan: General   Post-op Pain Management:    Induction:   Airway Management Planned:   Additional Equipment:   Intra-op Plan:   Post-operative Plan:   Informed Consent: I have reviewed the patients History and Physical, chart, labs and discussed the procedure including the  risks, benefits and alternatives for the proposed anesthesia with the patient or authorized representative who has indicated his/her understanding and acceptance.   Dental Advisory Given  Plan Discussed with: Anesthesiologist, CRNA and Surgeon  Anesthesia Plan Comments:         Anesthesia Quick Evaluation

## 2016-10-16 ENCOUNTER — Encounter: Payer: Self-pay | Admitting: Gastroenterology

## 2016-10-16 LAB — MISC LABCORP TEST (SEND OUT): LABCORP TEST CODE: 164814

## 2016-10-16 LAB — ANTI-SCLERODERMA ANTIBODY: Scleroderma (Scl-70) (ENA) Antibody, IgG: <0.2 AI (ref 0.0–0.9)

## 2016-10-16 LAB — SURGICAL PATHOLOGY

## 2016-10-21 ENCOUNTER — Encounter: Payer: Medicare Other | Admitting: *Deleted

## 2016-10-21 DIAGNOSIS — J439 Emphysema, unspecified: Secondary | ICD-10-CM

## 2016-10-21 DIAGNOSIS — J438 Other emphysema: Secondary | ICD-10-CM | POA: Diagnosis not present

## 2016-10-21 NOTE — Progress Notes (Signed)
Daily Session Note  Patient Details  Name: Catherine Landry MRN: [id number] Date of Birth: 18-May-1936 Referring Provider:   April Manson Pulmonary Rehab from 07/14/2016 in [facility] Cardiac and Pulmonary Rehab  Referring Provider  Ramachandran      Encounter Date: 10/21/2016  Check In:     Session Check In - 10/21/16 1114      Check-In   Location [facility]-Cardiac & Pulmonary Rehab   Staff Present Carson Myrtle, BS, RRT, Respiratory Lennie Hummer, MA, ACSM RCEP, Exercise Physiologist;Amanda Oletta Darter, BA, ACSM CEP, Exercise Physiologist   Supervising physician immediately available to respond to emergencies [facility] immediately available ER MD   Physician(s) Drs. Malinda and Williams   Medication changes reported     No   Fall or balance concerns reported    No   Warm-up and Cool-down Performed as group-led Location manager Performed Yes   VAD Patient? No     Pain Assessment   Currently in Pain? No/denies   Multiple Pain Sites No         Goals Met:  Proper associated with RPD/PD & O2 Sat Independence with exercise equipment Using PLB without cueing & demonstrates good technique Exercise tolerated well Strength training completed today  Goals Unmet:  Not Applicable  Comments: Pt able to follow exercise prescription today without complaint.  Will continue to monitor for progression.    Dr. Emily Filbert is Medical Director for [place] and [facility].

## 2016-10-23 ENCOUNTER — Encounter: Payer: Medicare Other | Attending: Internal Medicine | Admitting: *Deleted

## 2016-10-23 DIAGNOSIS — J439 Emphysema, unspecified: Secondary | ICD-10-CM

## 2016-10-23 DIAGNOSIS — J438 Other emphysema: Secondary | ICD-10-CM | POA: Diagnosis not present

## 2016-10-23 DIAGNOSIS — J449 Chronic obstructive pulmonary disease, unspecified: Secondary | ICD-10-CM | POA: Insufficient documentation

## 2016-10-23 NOTE — Progress Notes (Signed)
Daily Session Note  Patient Details  Name: Catherine Landry MRN: [id number] Date of Birth: 1936-05-01 Referring Provider:   Flowsheet Row Pulmonary Rehab from 07/14/2016 in [facility] Cardiac and Pulmonary Rehab  Referring Provider  Ramachandran      Encounter Date: 10/23/2016  Check In:     Session Check In - 10/23/16 1013      Check-In   Location [facility]-Cardiac & Pulmonary Rehab   Staff Present Gerlene Burdock, RN, Vickki Hearing, BA, ACSM CEP, Exercise Physiologist;Patricia Surles RN BSN   Supervising physician immediately available to respond to emergencies [facility] immediately available ER MD   Physician(s) Dr. Jimmye Norman and Dr. Marcelene Butte   Medication changes reported     No   Fall or balance concerns reported    No   Warm-up and Cool-down Performed on first and last piece of equipment   Resistance Training Performed Yes   VAD Patient? No     Pain Assessment   Currently in Pain? No/denies           Exercise Prescription Changes - 10/22/16 1300      Response to Exercise   Blood Pressure (Admit) 120/70   Blood Pressure (Exercise) 144/76   Blood Pressure (Exit) 122/62   Heart Rate (Admit) 84 bpm   Heart Rate (Exercise) 105 bpm   Heart Rate (Exit) 92 bpm   Oxygen Saturation (Exercise) 91 %   Oxygen Saturation (Exit) 89 %   Rating of Perceived Exertion (Exercise) 11   Perceived Dyspnea (Exercise) 2   Duration Progress to 45 minutes of aerobic exercise without signs/symptoms of physical distress   Intensity THRR unchanged     Progression   Progression Continue to progress workloads to maintain intensity without signs/symptoms of physical distress.     Resistance Training   Training Prescription Yes   Weight 2   Reps 10-12     Interval Training   Interval Training No     Oxygen   Oxygen Continuous   Liters 2     Treadmill   MPH 1.7   Grade 0   Minutes 15   METs 2.3     REL-XR   Level 2   Minutes 15      Goals Met:  Proper associated with RPD/PD  & O2 Sat Exercise tolerated well  Goals Unmet:  Not Applicable  Comments:     Dr. Emily Filbert is Medical Director for [place] and [facility].

## 2016-10-26 DIAGNOSIS — J438 Other emphysema: Secondary | ICD-10-CM | POA: Diagnosis not present

## 2016-10-26 DIAGNOSIS — J439 Emphysema, unspecified: Secondary | ICD-10-CM

## 2016-10-26 NOTE — Progress Notes (Signed)
Daily Session Note  Patient Details  Name: Catherine Landry MRN: [id number] Date of Birth: 1936-08-02 Referring Provider:   Flowsheet Row Pulmonary Rehab from 07/14/2016 in [facility] Cardiac and Pulmonary Rehab  Referring Provider  Ramachandran      Encounter Date: 10/26/2016  Check In:     Session Check In - 10/26/16 1215      Check-In   Location [facility]-Cardiac & Pulmonary Rehab   Staff Present Carson Myrtle, BS, RRT, Respiratory Therapist;Kelly Amedeo Plenty, BS, ACSM CEP, Exercise Physiologist;Dub Maclellan Oletta Darter, BA, ACSM CEP, Exercise Physiologist   Supervising physician immediately available to respond to emergencies [facility] immediately available ER MD   Physician(s) Jimmye Norman and Corky Downs   Medication changes reported     No   Fall or balance concerns reported    No   Warm-up and Cool-down Performed as group-led Location manager Performed Yes   VAD Patient? No     Pain Assessment   Currently in Pain? No/denies         Goals Met:   Proper associated with RPD/PD & O2 Sat Independence with exercise equipment Exercise tolerated well Strength training completed today  Goals Unmet:  Not Applicable  Comments: Pt able to follow exercise prescription today without complaint.  Will continue to monitor for progression.    Dr. Emily Filbert is Medical Director for [place] and [facility].

## 2016-10-28 ENCOUNTER — Encounter: Payer: Medicare Other | Admitting: *Deleted

## 2016-10-28 DIAGNOSIS — J439 Emphysema, unspecified: Secondary | ICD-10-CM

## 2016-10-28 DIAGNOSIS — J438 Other emphysema: Secondary | ICD-10-CM | POA: Diagnosis not present

## 2016-10-28 NOTE — Progress Notes (Signed)
Daily Session Note  Patient Details  Name: Catherine Landry MRN: [id number] Date of Birth: Aug 19, 1936 Referring Provider:   April Manson Pulmonary Rehab from 07/14/2016 in [facility] Cardiac and Pulmonary Rehab  Referring Provider  Ramachandran      Encounter Date: 10/28/2016  Check In:     Session Check In - 10/28/16 1005      Check-In   Location [facility]-Cardiac & Pulmonary Rehab   Staff Present Alberteen Sam, MA, ACSM RCEP, Exercise Physiologist;Laureen Owens Shark, BS, RRT, Respiratory Dareen Piano, BA, ACSM CEP, Exercise Physiologist   Supervising physician immediately available to respond to emergencies [facility] immediately available ER MD   Physician(s) Drs. Malinda and Robinson   Medication changes reported     No   Fall or balance concerns reported    No   Warm-up and Cool-down Performed as group-led Location manager Performed Yes   VAD Patient? No     Pain Assessment   Currently in Pain? No/denies   Multiple Pain Sites No         Goals Met:  Proper associated with RPD/PD & O2 Sat Independence with exercise equipment Using PLB without cueing & demonstrates good technique Exercise tolerated well Strength training completed today  Goals Unmet:  Not Applicable  Comments: Pt able to follow exercise prescription today without complaint.  Will continue to monitor for progression.    Dr. Emily Filbert is Medical Director for [place] and [facility].

## 2016-10-30 DIAGNOSIS — J438 Other emphysema: Secondary | ICD-10-CM | POA: Diagnosis not present

## 2016-10-30 DIAGNOSIS — J439 Emphysema, unspecified: Secondary | ICD-10-CM

## 2016-10-30 NOTE — Progress Notes (Signed)
Daily Session Note  Patient Details  Name: Catherine Landry MRN: [id number] Date of Birth: [birth year] Referring Provider:   April Manson Pulmonary Rehab from 07/14/2016 in [facility] Cardiac and Pulmonary Rehab  Referring Provider  Ramachandran      Encounter Date: 10/30/2016  Check In:     Session Check In - 10/30/16 1030      Check-In   Location [facility]-Cardiac & Pulmonary Rehab   Staff Present Nada Maclachlan, BA, ACSM CEP, Exercise Physiologist;Carroll Enterkin, RN, BSN;Terralyn Matsumura RN BSN   Supervising physician immediately available to respond to emergencies [facility] immediately available ER MD   Physician(s) Drs. Schaevitz and Williams   Medication changes reported     No   Fall or balance concerns reported    No   Warm-up and Cool-down Performed as group-led Location manager Performed Yes   VAD Patient? No     Pain Assessment   Currently in Pain? No/denies   Multiple Pain Sites No         Goals Met:  Proper associated with RPD/PD & O2 Sat Independence with exercise equipment Using PLB without cueing & demonstrates good technique Exercise tolerated well No report of cardiac concerns or symptoms Strength training completed today  Goals Unmet:  Not Applicable  Comments: Pt able to follow exercise prescription today without complaint.  Will continue to monitor for progression.    Dr. Emily Filbert is Medical Director for [place] and [facility].

## 2016-11-02 ENCOUNTER — Encounter: Payer: Self-pay | Admitting: Respiratory Therapy

## 2016-11-02 ENCOUNTER — Telehealth: Payer: Self-pay | Admitting: Internal Medicine

## 2016-11-02 DIAGNOSIS — J438 Other emphysema: Secondary | ICD-10-CM | POA: Diagnosis not present

## 2016-11-02 DIAGNOSIS — J439 Emphysema, unspecified: Secondary | ICD-10-CM

## 2016-11-02 NOTE — Progress Notes (Signed)
Daily Session Note  Patient Details  Name: Catherine Landry MRN: [id number] Date of Birth: January 10, 1936 Referring Provider:   Flowsheet Row Pulmonary Rehab from 07/14/2016 in [facility] Cardiac and Pulmonary Rehab  Referring Provider  Ramachandran      Encounter Date: 11/02/2016  Check In:     Session Check In - 11/02/16 1114      Check-In   Location [facility]-Cardiac & Pulmonary Rehab   Staff Present Nyoka Cowden, RN, BSN, [place], BS, RRT, Respiratory Dareen Piano, BA, ACSM CEP, Exercise Physiologist   Supervising physician immediately available to respond to emergencies [facility] immediately available ER MD   Physician(s) Legrand Rams and Mariea Clonts   Medication changes reported     No   Fall or balance concerns reported    No   Warm-up and Cool-down Performed as group-led instruction   Resistance Training Performed Yes   VAD Patient? No     Pain Assessment   Currently in Pain? No/denies         Goals Met:  Proper associated with RPD/PD & O2 Sat Independence with exercise equipment Exercise tolerated well Strength training completed today  Goals Unmet:  Not Applicable  Comments: Pt able to follow exercise prescription today without complaint.  Will continue to monitor for progression.    Dr. Emily Filbert is Medical Director for [place] and [facility].

## 2016-11-02 NOTE — Progress Notes (Signed)
Pulmonary Individual Treatment Plan  Patient Details  Name: Catherine Landry MRN: [id number] Date of Birth: 1936/02/07 Referring Provider:   Flowsheet Row Pulmonary Rehab from 07/14/2016 in [facility] and Pulmonary Rehab  Referring Provider  Ramachandran      Initial Encounter Date:  Flowsheet Row Pulmonary Rehab from 07/14/2016 in [facility] Cardiac and Pulmonary Rehab  Date  07/14/16  Referring Provider  Ashby Dawes      Visit Diagnosis: Pulmonary emphysema, unspecified emphysema type ([place])  Patient's Home Medications on Admission:  Current Outpatient Prescriptions:    ADVAIR DISKUS 250-50 MCG/DOSE AEPB, Inhale 1 puff into the lungs 2 (two) times daily. *Rinse mouth after use*, Disp: , Rfl: 1   albuterol (PROVENTIL) (2.5 MG/3ML) 0.083% nebulizer solution, Take 3-6 mLs by nebulization every 6 (six) hours as needed. For shortness of breath/wheezing., Disp: , Rfl: 5   aspirin EC 81 MG tablet, Take 81 mg by mouth every morning., Disp: , Rfl:    bisacodyl (DULCOLAX) 5 MG EC tablet, Take 1 tablet (5 mg total) by mouth daily as needed for moderate constipation. (Patient not taking: Reported on 10/15/2016), Disp: 30 tablet, Rfl: 0   Calcium-Magnesium-Vitamin D (CALCIUM 1200+D3 PO), Take 1 tablet by mouth every morning., Disp: , Rfl:    COMBIVENT RESPIMAT 20-100 MCG/ACT AERS respimat, Inhale 1 puff into the lungs 4 (four) times daily as needed. For COPD., Disp: , Rfl: 5   dicyclomine (BENTYL) 10 MG capsule, Take 10 mg by mouth 2 (two) times daily as needed for muscle spasms. For up to 10 days, Disp: , Rfl:    losartan (COZAAR) 50 MG tablet, Take 50 mg by mouth every morning., Disp: , Rfl:    metoprolol succinate (TOPROL-XL) 25 MG 24 hr tablet, Take 25 mg by mouth every morning., Disp: , Rfl:    Multiple Vitamin (MULTIVITAMIN WITH MINERALS) TABS, Take 1 tablet by mouth every morning., Disp: , Rfl:    NEOMYCIN-POLYMYXIN-HYDROCORTISONE (CORTISPORIN) 1 % SOLN otic solution, Apply 1-2  drops to toe BID after soaking, Disp: 10 mL, Rfl: 1   omeprazole (PRILOSEC) 40 MG capsule, Take 40 mg by mouth 3 (three) times a week. , Disp: , Rfl: 5   polyethylene glycol powder (GLYCOLAX/MIRALAX) powder, Take 17 g by mouth daily as needed for constipation. Mix with 4 to 8 ounces of fluid., Disp: , Rfl:    predniSONE (DELTASONE) 10 MG tablet, Start with 80m daily and decrease by 131mdaily (Patient not taking: Reported on 10/15/2016), Disp: 21 tablet, Rfl: 0   predniSONE (STERAPRED UNI-PAK 21 TAB) 10 MG (21) TBPK tablet, Take 6 tablets on Day 1, then 5-4-3-2-1 until complete (Patient not taking: Reported on 10/15/2016), Disp: 21 tablet, Rfl: 0   theophylline (THEO-24) 300 MG 24 hr capsule, Take 1 capsule (300 mg total) by mouth every morning., Disp: 30 capsule, Rfl: 0  Past Medical History: Past Medical History:  Diagnosis Date   Asthma    [place]   [place]   kid[place]    COPD (chronic obstructive pulmonary disease) ([place]   Hypertension    Raynaud's disease    Renal insufficiency     Tobacco Use: History  Smoking Status   Former Smoker   Packs/day: 1.50   Quit date: 12/09/1999  Smokeless Tobacco   Never Used    Labs: Recent Review Flowsheet Data    There is no flowsheet data to display.       ADL UCSD:     Pulmonary  Assessment Scores    Row Name 07/14/16 1253 09/02/16 1404 10/28/16 1441     ADL UCSD   ADL Phase Entry Mid Exit   SOB Score total 68 56 61   Rest 0 0 1   Walk 2 2 3    Stairs 4 5 5    Bath 3 3 2    Dress 2 2 0   Shop 5 5 5       Pulmonary Function Assessment:     Pulmonary Function Assessment - 07/14/16 1250      Initial Spirometry Results   FVC% 28 %   FEV1% 24 %   FEV1/FVC Ratio 65.06   Comments Test date 07/14/16     Post Bronchodilator Spirometry Results   FVC% 43 %   FEV1% 28 %   FEV1/FVC Ratio 58.31     Breath   Bilateral Breath Sounds Clear;Decreased   Shortness of Breath  Yes;Limiting activity;Fear of Shortness of Breath      Exercise Target Goals:    Exercise Program Goal: Individual exercise prescription set with THRR, safety & activity barriers. Participant demonstrates ability to understand and report RPE using BORG scale, to self-measure pulse accurately, and to acknowledge the importance of the exercise prescription.  Exercise Prescription Goal: Starting with aerobic activity 30 plus minutes a day, 3 days per week for initial exercise prescription. Provide home exercise prescription and guidelines that participant acknowledges understanding prior to discharge.  Activity Barriers & Risk Stratification:     Activity Barriers & Cardiac Risk Stratification - 07/14/16 1249      Activity Barriers & Cardiac Risk Stratification   Activity Barriers Arthritis;Back Problems;Shortness of Breath;Deconditioning;Right Hip Replacement   Cardiac Risk Stratification Moderate      6 Minute Walk:     6 Minute Walk    Row Name 07/14/16 1300 07/14/16 1329 09/02/16 1221     6 Minute Walk   Phase  --  -- Mid Program   Distance 712 feet 712 feet 900 feet   Distance % Change  --  -- 26 %   Walk Time 4.75 minutes 4.75 minutes 5.3 minutes   # of Rest Breaks 3 3 1    MPH 1.7 1.7 1.93   METS 1.43 2.3 2.45   RPE 15 15 12    Perceived Dyspnea  4 4 2    VO2 Peak 5.02 5.02 6.7   Symptoms Yes (comment) Yes (comment) No   Comments Short of breath short of breath  --   Resting HR 78 bpm 78 bpm 85 bpm   Resting BP 134/72 134/72 128/70   Max Ex. HR 102 bpm 102 bpm 103 bpm   Max Ex. BP 134/60 134/60 152/64     Interval HR   Baseline HR 78 78  --   1 Minute HR 98 98  --   2 Minute HR 102 102  --   3 Minute HR 98 98  --   4 Minute HR 102 102  --   5 Minute HR 96 96  --   6 Minute HR 101 101  --   2 Minute Post HR 87 87  --   Interval Heart Rate? Yes Yes  --     Interval Oxygen   Interval Oxygen? Yes  --  --   Baseline Oxygen Saturation % 95 % 95 %  --    Baseline Liters of Oxygen 3 L 3 L  --   1 Minute Oxygen Saturation % 85 % 85 %  --  1 Minute Liters of Oxygen 3 L 3 L  --   2 Minute Oxygen Saturation % 86 % 86 %  --   2 Minute Liters of Oxygen 3 L 3 L  --   3 Minute Oxygen Saturation % 86 % 86 %  --   3 Minute Liters of Oxygen 3 L 3 L  --   4 Minute Oxygen Saturation % 85 % 85 %  --   4 Minute Liters of Oxygen 3 L 3 L  --   5 Minute Oxygen Saturation % 86 % 86 %  --   5 Minute Liters of Oxygen 3 L 3 L  --   6 Minute Oxygen Saturation % 83 % 83 %  --   6 Minute Liters of Oxygen 3 L 3 L  --   2 Minute Post Oxygen Saturation % 94 % 94 %  --   2 Minute Post Liters of Oxygen 3 L 3 L  --      Initial Exercise Prescription:     Initial Exercise Prescription - 07/14/16 1300      Date of Initial Exercise RX and Referring Provider   Date 07/14/16   Referring Provider Ashby Dawes     Oxygen   Oxygen Continuous   Liters 3     Treadmill   MPH 1.5   Grade 0   Minutes 15  3/3/3/3   METs 2.15     REL-XR   Level 2   Minutes 15   METs 2     T5 Nustep   Level 1   Minutes 15   METs 2     Prescription Details   Frequency (times per week) 3   Duration Progress to 45 minutes of aerobic exercise without signs/symptoms of physical distress     Intensity   THRR 40-80% of Max Heartrate 104-129   Ratings of Perceived Exertion 11-13   Perceived Dyspnea 0-4     Progression   Progression Continue to progress workloads to maintain intensity without signs/symptoms of physical distress.     Resistance Training   Training Prescription Yes   Weight 2   Reps 10-12      Perform Capillary Blood Glucose checks as needed.  Exercise Prescription Changes:     Exercise Prescription Changes    Row Name 07/27/16 1200 07/30/16 1100 08/11/16 1500 08/26/16 1300 09/02/16 1200     Exercise Review   Progression  --  -- Yes Yes  --     Response to Exercise   Blood Pressure (Admit) 114/60 126/56  -- 142/72  --   Blood Pressure  (Exercise) 130/80 124/64 144/70 134/66  --   Blood Pressure (Exit) 130/68 128/70 120/80 144/78  --   Heart Rate (Admit) 82 bpm 82 bpm  -- 88 bpm  --   Heart Rate (Exercise) 102 bpm 97 bpm 95 bpm 112 bpm  --   Heart Rate (Exit) 90 bpm 70 bpm 90 bpm 80 bpm  --   Oxygen Saturation (Admit) 95 % 95 % 94 % 93 %  --   Oxygen Saturation (Exercise) 86 %  Sat dropped to 86 at RA on TM. W/ 2L of O2 Sat increased 91. 91 % 91 % 93 %  --   Oxygen Saturation (Exit) 93 % 93 % 89 % 97 %  --   Rating of Perceived Exertion (Exercise) 12 12 12 12   --   Perceived Dyspnea (Exercise) 2 0 2 2  --  Duration  --  --  -- Progress to 45 minutes of aerobic exercise without signs/symptoms of physical distress  --   Intensity  --  --  -- THRR unchanged  --     Progression   Progression  -- Continue to progress workloads to maintain intensity without signs/symptoms of physical distress. Continue to progress workloads to maintain intensity without signs/symptoms of physical distress. Continue to progress workloads to maintain intensity without signs/symptoms of physical distress.  --     Horticulturist, commercial Prescription Yes Yes Yes Yes  --   Weight 2 2 2 2   --   Reps 10-12 10-12 10-12 10-12  --     Interval Training   Interval Training  -- No No No  --     Oxygen   Oxygen Continuous Continuous Continuous Continuous  --   Liters 3 2  added om treadmill only 2 2  --     Treadmill   MPH 1.5 1 1.1 1.5  --   Grade 0 0 0 0  --   Minutes 15  3/3/3/3 15 15 15   --   METs 2.15  --  -- 2.15  --     NuStep   Level  -- 1 2 4   --   Minutes  -- 15 15 15   --   METs  --  --  -- 2.5  --     REL-XR   Level 2 2 2 2   --   Minutes 15 15 15 15   --   METs 2 1.7  --  --  --     T5 Nustep   Level 1  --  --  --  --   Minutes 15  --  --  --  --   METs 2  --  --  --  --     Home Exercise Plan   Plans to continue exercise at  --  --  --  -- [employer]   Frequency  --  --  --  -- Add 2 additional days to program  exercise sessions.   [place] Name 09/24/16 1200 10/09/16 0900 10/22/16 1300         Exercise Review   Progression Yes Yes  --       Response to Exercise   Blood Pressure (Admit) 124/70 138/70 120/70     Blood Pressure (Exercise) 124/64 130/68 144/76     Blood Pressure (Exit) 140/68  -- 122/62     Heart Rate (Admit)  -- 92 bpm 84 bpm     Heart Rate (Exercise) 93 bpm 95 bpm 105 bpm     Heart Rate (Exit) 83 bpm 88 bpm 92 bpm     Oxygen Saturation (Exercise) 92 % 95 % 91 %     Oxygen Saturation (Exit) 93 % 92 % 89 %     Rating of Perceived Exertion (Exercise) 12 12 11      Perceived Dyspnea (Exercise) 2 2 2      Duration Progress to 45 minutes of aerobic exercise without signs/symptoms of physical distress Progress to 45 minutes of aerobic exercise without signs/symptoms of physical distress Progress to 45 minutes of aerobic exercise without signs/symptoms of physical distress     Intensity THRR unchanged THRR unchanged THRR unchanged       Progression   Progression Continue to progress workloads to maintain intensity without signs/symptoms of physical distress. Continue to progress workloads to maintain intensity without signs/symptoms of physical  distress. Continue to progress workloads to maintain intensity without signs/symptoms of physical distress.       Resistance Training   Training Prescription Yes Yes Yes     Weight 2 2 2      Reps 10-12 10-12 10-12       Interval Training   Interval Training No No No       Oxygen   Oxygen Continuous Continuous Continuous     Liters 2 2 2        Treadmill   MPH 1.4 1.5 1.7     Grade 0 0 0     Minutes 15 15 15      METs 2.07 2.15 2.3       NuStep   Level 4 --  --     Minutes 15 --  --     METs 2.2 --  --       REL-XR   Level  -- 2 2     Minutes  -- 15 15     METs  -- 3  --        Exercise Comments:     Exercise Comments    Row Name 07/14/16 1312 07/27/16 1218 07/30/16 1147 08/11/16 1519 08/26/16 1311   Exercise Comments Catherine  Ivey began the test on room air but droppped quickly too low to continue the test.  She completed the test on 3L continuous oxygen. First full day of exercise!  Patient was oriented to gym and equipment including functions, settings, policies, and procedures.  Patient's individual exercise prescription and treatment plan were reviewed.  All starting workloads were established based on the results of the 6 minute walk test done at initial orientation visit.  The plan for exercise progression was also introduced and progression will be customized based on patient's performance and goals. Catherine Masseys 02 dropped to 29 on Tm so she has added 2L continuous oxygen on TM only.  O2 sats were 91 with oxygen. Catherine Landry is progressing well with exerccise. Catherine Landry is progressing well with exercise and has maintained normal )2 levels since adding 2L continuous during exercise.   [place] Name 09/02/16 1215 09/24/16 1244 10/09/16 4132 10/22/16 1309     Exercise Comments  Reviewed home exercise with pt today.  Pt plans to walk/join [employer] for exercise.  Reviewed THR, pulse, RPE, sign and symptoms, NTG use, and when to call 911 or MD.  Also discussed weather considerations and indoor options.  Pt voiced understanding. Catherine Landry is doing well for nearing the midpoint of the program.  Staff will continue to monitor her progress. Catherine Landry is progressing well with exercise. Catherine Landry is progressing well with exercise and has added speed to the TM.       Discharge Exercise Prescription (Final Exercise Prescription Changes):     Exercise Prescription Changes - 10/22/16 1300      Response to Exercise   Blood Pressure (Admit) 120/70   Blood Pressure (Exercise) 144/76   Blood Pressure (Exit) 122/62   Heart Rate (Admit) 84 bpm   Heart Rate (Exercise) 105 bpm   Heart Rate (Exit) 92 bpm   Oxygen Saturation (Exercise) 91 %   Oxygen Saturation (Exit) 89 %   Rating of Perceived Exertion (Exercise) 11   Perceived Dyspnea (Exercise) 2   Duration  Progress to 45 minutes of aerobic exercise without signs/symptoms of physical distress   Intensity THRR unchanged     Progression   Progression Continue to progress workloads to maintain intensity without signs/symptoms of  physical distress.     Resistance Training   Training Prescription Yes   Weight 2   Reps 10-12     Interval Training   Interval Training No     Oxygen   Oxygen Continuous   Liters 2     Treadmill   MPH 1.7   Grade 0   Minutes 15   METs 2.3     REL-XR   Level 2   Minutes 15       Nutrition:  Target Goals: Understanding of nutrition guidelines, daily intake of sodium <1530m, cholesterol <2067m calories 30% from fat and 7% or less from saturated fats, daily to have 5 or more servings of fruits and vegetables.  Biometrics:     Pre Biometrics - 07/14/16 1257      Pre Biometrics   Height 5' 2.5" (1.588 m)   Weight 147 lb 12.8 oz (67 kg)   Waist Circumference 33.5 inches   Hip Circumference 42.5 inches   Waist to Hip Ratio 0.79 %   BMI (Calculated) 26.7       Nutrition Therapy Plan and Nutrition Goals:   Nutrition Discharge: Rate Your Plate Scores:   Psychosocial: Target Goals: Acknowledge presence or absence of depression, maximize coping skills, provide positive Landry system. Participant is able to verbalize types and ability to use techniques and skills needed for reducing Landry and depression.  Initial Review & Psychosocial Screening:     Initial Psych Review & Screening - 07/14/16 [place]   Comments Catherine MaFettingas good Landry from he husband of [id number] has faced cancer twice and now COPD, but she has a very positive outlook on life. One concern is her husband's early stages of Alzheimers and his care. [place]   Barriers   Psychosocial barriers to participate in program The patient should benefit from training in Landry management and  relaxation.     Screening Interventions   Interventions Encouraged to exercise;Program counselor consult      Quality of Life Scores:     Quality of Life - 10/28/16 1443      Quality of Life Scores   Health/Function Pre 21 %   Health/Function Post 20.75 %   Health/Function % Change -1.19 %   Socioeconomic Pre 21 %   Socioeconomic Post 20.71 %   Socioeconomic % Change  -1.38 %   Psych/Spiritual Pre 21 %   Psych/Spiritual Post 21 %   Psych/Spiritual % Change 0 %   Family Pre 21 %   Family Post 21 %   Family % Change 0 %   GLOBAL Pre 21 %   GLOBAL Post 20.83 %   GLOBAL % Change -0.81 %      PHQ-9: Recent Review Flowsheet Data    [facility] 07/14/2016   Decreased Interest 0 0   Down, Depressed, Hopeless 0 0   PHQ - 2 Score 0 0   Altered sleeping 0 0   Tired, decreased energy 0 1   Change in appetite 0 1   Feeling bad or failure about yourself  0 0   Trouble concentrating 0 0   Moving slowly or fidgety/restless 0 0   Suicidal thoughts 0 0   PHQ-9 Score 0 2   Difficult doing work/chores Not difficult at all Not difficult at all      Psychosocial Evaluation and Intervention:  Psychosocial Evaluation - 07/27/16 1149      Psychosocial Evaluation & Interventions   Interventions Encouraged to exercise with the program and follow exercise prescription   Comments Counselor met with Catherine. Burnett Harry (Catherine. Jerilynn Mages) today for initial psychosocial evaluation.  She is a 81 year old who has been diagnosed with COPD.  She has a strong Landry system with a spouse of over 67 years; (2) adult children who live closeby; and a sister and niece locally as well.  Catherine. Jerilynn Mages has a healthy history of bladder cancer and is being followed every (4) months at [facility] subsequent to chemotherapy.  She also lost a kidney to cancer in 2000.  She reports sleeping well and having a good appetite.  She denies a history of depression or anxiety or any current symptoms. She states she is  typically positive most of the time and has minimal Landry in her life currently.  She has goals to walk better without being short of breath.  Staff will follow with Catherine. M throughout the course of this program.        Psychosocial Re-Evaluation:     Psychosocial Re-Evaluation    Row Name 08/12/16 1029 08/19/16 1135 09/11/16 1048 09/25/16 1102 10/23/16 1024     Psychosocial Re-Evaluation   Interventions  --  --  -- Encouraged to attend Pulmonary Rehabilitation for the exercise Encouraged to attend Pulmonary Rehabilitation for the exercise   Comments Counselor follow up with Catherine. M today reporting she is feeling stronger in general and has been breathing better overall; but was struggling some today with shortness of breath.  She Landry to be in a positive mood although she has some Landry with her spouse's health declining.  She is unable to go away for Thanksgiving with family but she and her spouse are joining friends at a [employer] and she seemed fine with that option.  Catherine. Jerilynn Mages states she is sleeping well and enjoying this program.  Staff will continue to follow with her.   Catherine. Jerilynn Mages reports having a good Thanksgiving with friends at a [employer] and her spouse was able to accompany her.  She is reporting more energy and ability to work in her yard now than before coming into this program.  Catherine. Jerilynn Mages also was happy to announce that she has not had to use her Nebulizer for the past two weeks and she was actively using it daily for the last few years.  She is proud of her progress and is handling the Landry of her spouses's declining health better as a result of her positive self-care.  Counselor commended Catherine. M for her progress made and her commitment to exercise and this program.   Catherine Landry reported her battery died so her husband took out her car battery to check it in his car. He passed out doing that and was admitted to the hospital. They still do not know why he passed out. Catherine Landry of course reported  it was stressfull to find him passed out.  Catherine Landry reports doing well with her Landry at this time.  She states her husband seems to be handling his mile alzheimer's better, which helps her handle it better as well.  "We just take care of each other", she says.  She is thankful he can still be left home by himself, which is helpful for her.  She Landry to have family Landry, stating she can call her grandchildren anytime.  Catherine Landry is in good spirits and has a good  outlook on life and on taking care of herself.  Catherine. Landry Landry to do deny any extra Landry and states she is sleeping well and has a normal appetite.  She Landry to feel good Landry from her nieces and nephews and grandchildren, who are very supportive and nearby.  She does state that she and her husband have multiple out of town doctor appointments to attend, but finds comfort and strength in prayer and her faith.     Landry Psychosocial Services Needed  --  --  -- Yes Yes     Education: Education Goals: Education classes will be provided on a weekly basis, covering required topics. Participant will state understanding/return demonstration of topics presented.  Learning Barriers/Preferences:     Learning Barriers/Preferences - 07/14/16 1250      Learning Barriers/Preferences   Learning Barriers None   Learning Preferences None      Education Topics: Initial Evaluation Education: - Verbal, written and demonstration of respiratory meds, RPE/PD scales, oximetry and breathing techniques. Instruction on use of nebulizers and MDIs: cleaning and proper use, rinsing mouth with steroid doses and importance of monitoring MDI activations. Flowsheet Row Pulmonary Rehab from 10/28/2016 in [facility] and Pulmonary Rehab  Date  07/14/16  Educator  LB  Instruction Review Code  2- meets goals/outcomes      General Nutrition Guidelines/Fats and Fiber: -Group instruction provided by verbal, written material, models and posters to  present the general guidelines for heart healthy nutrition. Gives an explanation and review of dietary fats and fiber. Flowsheet Row Pulmonary Rehab from 10/28/2016 in [facility] Cardiac and Pulmonary Rehab  Date  08/10/16  Educator  CR  Instruction Review Code  2- meets goals/outcomes      Controlling Sodium/Reading Food Labels: -Group verbal and written material supporting the discussion of sodium use in heart healthy nutrition. Review and explanation with models, verbal and written materials for utilization of the food label. Flowsheet Row Pulmonary Rehab from 10/28/2016 in [facility] Cardiac and Pulmonary Rehab  Date  08/17/16  Educator  CR  Instruction Review Code  2- meets goals/outcomes      Exercise Physiology & Risk Factors: - Group verbal and written instruction with models to review the exercise physiology of the cardiovascular system and associated critical values. Details cardiovascular disease risk factors and the goals associated with each risk factor. Flowsheet Row Pulmonary Rehab from 10/28/2016 in [facility] Cardiac and Pulmonary Rehab  Date  08/05/16  Educator  AS [[facility]]  Instruction Review Code  2- meets goals/outcomes      Aerobic Exercise & Resistance Training: - Gives group verbal and written discussion on the health impact of inactivity. On the components of aerobic and resistive training programs and the benefits of this training and how to safely progress through these programs. Flowsheet Row Pulmonary Rehab from 10/28/2016 in [facility] Cardiac and Pulmonary Rehab  Date  10/21/16  Educator  [facility]  Instruction Review Code  2- meets goals/outcomes      Flexibility, Balance, General Exercise Guidelines: - Provides group verbal and written instruction on the benefits of flexibility and balance training programs. Provides general exercise guidelines with specific guidelines to those with heart or lung disease. Demonstration and skill practice provided.   Landry Management: - Provides group  verbal and written instruction about the health risks of elevated Landry, cause of high Landry, and healthy ways to reduce Landry.   Depression: - Provides group verbal and written instruction on the correlation between heart/lung disease and depressed mood, treatment  options, and the stigmas associated with seeking treatment. Flowsheet Row Pulmonary Rehab from 10/28/2016 in [facility] Cardiac and Pulmonary Rehab  Date  08/24/16  Educator  [facility]  Instruction Review Code  2- meets goals/outcomes      Exercise & Equipment Safety: - Individual verbal instruction and demonstration of equipment use and safety with use of the equipment. Flowsheet Row Pulmonary Rehab from 10/28/2016 in [facility] Cardiac and Pulmonary Rehab  Date  07/27/16  Educator  LB  Instruction Review Code  2- meets goals/outcomes      Infection Prevention: - Provides verbal and written material to individual with discussion of infection control including proper hand washing and proper equipment cleaning during exercise session. Flowsheet Row Pulmonary Rehab from 10/28/2016 in [facility] Cardiac and Pulmonary Rehab  Date  07/27/16  Educator  LB  Instruction Review Code  2- meets goals/outcomes      Falls Prevention: - Provides verbal and written material to individual with discussion of falls prevention and safety. Flowsheet Row Pulmonary Rehab from 10/28/2016 in [facility] Cardiac and Pulmonary Rehab  Date  07/14/16  Educator  LB  Instruction Review Code  2- meets goals/outcomes      Diabetes: - Individual verbal and written instruction to review signs/symptoms of diabetes, desired ranges of glucose level fasting, after meals and with exercise. Advice that pre and post exercise glucose checks will be done for 3 sessions at entry of program.   Chronic Lung Diseases: - Group verbal and written instruction to review new updates, new respiratory medications, new advancements in procedures and treatments. Provide informative websites and "800"  numbers of self-education.   Lung Procedures: - Group verbal and written instruction to describe testing methods done to diagnose lung disease. Review the outcome of test results. Describe the treatment choices: Pulmonary Function Tests, ABGs and oximetry.   Energy Conservation: - Provide group verbal and written instruction for methods to conserve energy, plan and organize activities. Instruct on pacing techniques, use of adaptive equipment and posture/positioning to relieve shortness of breath.   Triggers: - Group verbal and written instruction to review types of environmental controls: home humidity, furnaces, filters, dust mite/pet prevention, HEPA vacuums. To discuss weather changes, air quality and the benefits of nasal washing.   Exacerbations: - Group verbal and written instruction to provide: warning signs, infection symptoms, calling MD promptly, preventive modes, and value of vaccinations. Review: effective airway clearance, coughing and/or vibration techniques. Create an Sports administrator. Flowsheet Row Pulmonary Rehab from 10/28/2016 in [facility] Cardiac and Pulmonary Rehab  Date  08/26/16  Educator  LB  Instruction Review Code  2- meets goals/outcomes      Oxygen: - Individual and group verbal and written instruction on oxygen therapy. Includes supplement oxygen, available portable oxygen systems, continuous and intermittent flow rates, oxygen safety, concentrators, and Medicare reimbursement for oxygen. Flowsheet Row Pulmonary Rehab from 10/28/2016 in [facility] Cardiac and Pulmonary Rehab  Date  07/14/16  Educator  LB  Instruction Review Code  2- meets goals/outcomes      Respiratory Medications: - Group verbal and written instruction to review medications for lung disease. Drug class, frequency, complications, importance of spacers, rinsing mouth after steroid MDI's, and proper cleaning methods for nebulizers. Flowsheet Row Pulmonary Rehab from 10/28/2016 in [facility] Cardiac and Pulmonary  Rehab  Date  07/14/16  Educator  LB  Instruction Review Code  2- meets goals/outcomes      AED/CPR: - Group verbal and written instruction with the use of models to demonstrate the basic use  of the AED with the basic ABC's of resuscitation. Flowsheet Row Pulmonary Rehab from 10/28/2016 in [facility] Cardiac and Pulmonary Rehab  Date  07/31/16  Educator  C. EnterkinRN  Instruction Review Code  2- meets goals/outcomes      Breathing Retraining: - Provides individuals verbal and written instruction on purpose, frequency, and proper technique of diaphragmatic breathing and pursed-lipped breathing. Applies individual practice skills. Flowsheet Row Pulmonary Rehab from 10/28/2016 in [facility] Cardiac and Pulmonary Rehab  Date  07/27/16  Educator  LB  Instruction Review Code  2- meets goals/outcomes      Anatomy and Physiology of the Lungs: - Group verbal and written instruction with the use of models to provide basic lung anatomy and physiology related to function, structure and complications of lung disease. Flowsheet Row Pulmonary Rehab from 10/28/2016 in [facility] Cardiac and Pulmonary Rehab  Date  08/19/16  Educator  LB  Instruction Review Code  2- meets goals/outcomes      Heart Failure: - Group verbal and written instruction on the basics of heart failure: signs/symptoms, treatments, explanation of ejection fraction, enlarged heart and cardiomyopathy. Flowsheet Row Pulmonary Rehab from 10/28/2016 in [facility] Cardiac and Pulmonary Rehab  Date  09/04/16  Educator  SB  Instruction Review Code  2- meets goals/outcomes      Sleep Apnea: - Individual verbal and written instruction to review Obstructive Sleep Apnea. Review of risk factors, methods for diagnosing and types of masks and machines for OSA.   Anxiety: - Provides group, verbal and written instruction on the correlation between heart/lung disease and anxiety, treatment options, and management of anxiety.   Relaxation: - Provides group,  verbal and written instruction about the benefits of relaxation for patients with heart/lung disease. Also provides patients with examples of relaxation techniques. Flowsheet Row Pulmonary Rehab from 10/28/2016 in [facility] Cardiac and Pulmonary Rehab  Date  10/28/16  Educator  [facility]  Instruction Review Code  2- Meets goals/outcomes      Knowledge Questionnaire Score:     Knowledge Questionnaire Score - 10/28/16 1443      Knowledge Questionnaire Score   Post Score 6/10       Core Components/Risk Factors/Patient Goals at Admission:     Personal Goals and Risk Factors at Admission - 07/14/16 1257      Core Components/Risk Factors/Patient Goals on Admission   Sedentary Yes  Likes to walk and camp   Intervention Provide advice, education, Landry and counseling about physical activity/exercise needs.;Develop an individualized exercise prescription for aerobic and resistive training based on initial evaluation findings, risk stratification, comorbidities and participant's personal goals.   Expected Outcomes Achievement of increased cardiorespiratory fitness and enhanced flexibility, muscular endurance and strength shown through measurements of functional capacity and personal statement of participant.   Increase Strength and Stamina Yes   Intervention Provide advice, education, Landry and counseling about physical activity/exercise needs.;Develop an individualized exercise prescription for aerobic and resistive training based on initial evaluation findings, risk stratification, comorbidities and participant's personal goals.   Expected Outcomes Achievement of increased cardiorespiratory fitness and enhanced flexibility, muscular endurance and strength shown through measurements of functional capacity and personal statement of participant.   Improve shortness of breath with ADL's Yes   Intervention Provide education, individualized exercise plan and daily activity instruction to help decrease  symptoms of SOB with activities of daily living.   Expected Outcomes Short Term: Achieves a reduction of symptoms when performing activities of daily living.   Develop more efficient breathing techniques such as purse  lipped breathing and diaphragmatic breathing; and practicing self-pacing with activity Yes   Intervention Provide education, demonstration and Landry about specific breathing techniuqes utilized for more efficient breathing. Include techniques such as pursed lipped breathing, diaphragmatic breathing and self-pacing activity.   Expected Outcomes Short Term: Participant will be able to demonstrate and use breathing techniques as needed throughout daily activities.   Increase knowledge of respiratory medications and ability to use respiratory devices properly  Yes  Advair, Combivent Respimat, SVN Albuterol; oxygen 2l/m at night and as needed   Intervention Provide education and demonstration as needed of appropriate use of medications, inhalers, and oxygen therapy.   Expected Outcomes Short Term: Achieves understanding of medications use. Understands that oxygen is a medication prescribed by physician. Demonstrates appropriate use of inhaler and oxygen therapy.   Hypertension Yes   Intervention Provide education on lifestyle modifcations including regular physical activity/exercise, weight management, moderate sodium restriction and increased consumption of fresh fruit, vegetables, and low fat dairy, alcohol moderation, and smoking cessation.;Monitor prescription use compliance.   Expected Outcomes Short Term: Landry assessment and intervention until BP is < 140/73m HG in hypertensive participants. < 130/853mHG in hypertensive participants with diabetes, heart failure or chronic kidney disease.;Long Term: Maintenance of blood pressure at goal levels.      Core Components/Risk Factors/Patient Goals Review:      Goals and Risk Factor Review    Row Name 07/27/16 1207 08/10/16 0849  08/24/16 1532 09/25/16 1107 10/23/16 1045     Core Components/Risk Factors/Patient Goals Review   Personal Goals Review Develop more efficient breathing techniques such as purse lipped breathing and diaphragmatic breathing and practicing self-pacing with activity. Sedentary;Increase Strength and Stamina;Improve shortness of breath with ADL's;Increase knowledge of respiratory medications and ability to use respiratory devices properly.;Develop more efficient breathing techniques such as purse lipped breathing and diaphragmatic breathing and practicing self-pacing with activity.;Hypertension Sedentary;Increase Strength and Stamina;Improve shortness of breath with ADL's;Increase knowledge of respiratory medications and ability to use respiratory devices properly.;Develop more efficient breathing techniques such as purse lipped breathing and diaphragmatic breathing and practicing self-pacing with activity.;Hypertension Sedentary;Increase Strength and Stamina;Develop more efficient breathing techniques such as purse lipped breathing and diaphragmatic breathing and practicing self-pacing with activity.;Improve shortness of breath with ADL's;Increase knowledge of respiratory medications and ability to use respiratory devices properly.;Hypertension Increase Strength and Stamina;Sedentary;Improve shortness of breath with ADL's;Hypertension;Increase knowledge of respiratory medications and ability to use respiratory devices properly.;Develop more efficient breathing techniques such as purse lipped breathing and diaphragmatic breathing and practicing self-pacing with activity.   Review Pursed lip breathing techniques were explained and discussed with the patient. The patient demonstrated understanding of this breathing technique and when to use it during exercise and ADL's.  Catherine Landry [place] at [place] for sleep, but she has not used it during the day. Walking on the  treadmill has been difficult for Catherine Landry O2Sat's in the mid 80's, so we have added 2-3l/m of oxygen for O2Sat's in the low 90's. Shopping and some housework make tired, so I have encouraged her to check her O2Sat's to check if they are low and if she should be wearing oxyge, pacing.Catherine Landry a good understaning of her MDI's and has acceptable BP readings in [facility]. Catherine Landry to maintain acceptable BP during [facility]. She is compliant with her MDI's. Recently she felt an exacerbation coming on and immediately called Dr RaAshby Dawesho called in a prescription for an antibiotic and  prednisone taper. During the treadmill, Catherine Landry is using oxygen to maintain acceptable O2Sat's in the 90's and will also use it with her other exercise goals if needed. Her PLB technique is helpful for activity in [facility] and at home.  Catherine Landry Landry to maintain BP WNL here at [employer] and Landry to be compliant with her MDI's.  She states she has seen an improvement in her ability to complete ADL's and her ability to do more activity with less O2 use.  She also finds herself using her rescue inhaler less now after starting [facility].  She was recently caught off guard at [facility] as a visitor who had to walk long distances without her O2 and was impressed with how well she did with her breathing.  Continuing to practice pursed-lip-breathing in class and at home.   Catherine Landry is happy with her new exercise routine and is now implementing home exercise along with her [facility] routine.  She states she always stays active and has seen an improvement in her strenght and stamina.  Catherine Landry is now able to walk longer distances without using her O2 tank.  Catherine Landry has a good knowledge of her respiratory meds, Advair and rescue inhaler, Combivent, and theophylline.  She is not having to use her rescue inhaler often.  Her Blood Pressure has been within normal limits at [employer].  Catherine Landry demonstrates pursed-lip breathing in  [facility] class.     Expected Outcomes Patient will use PLB during exercise and ADL's to help improve oxygen saturations and control SOB.   -- Continue managing her COPD and using oxygen to maintain acceptable O2Sat's. Continue to manage her COPD and using O2 to maintain acceptable O2 saturations.   Continue to manage her COPD and using O2 at night to maintain acceptable O2 saturations while she sleeps.  Continue to attend [facility] and improve strength and stamina.       Core Components/Risk Factors/Patient Goals at Discharge (Final Review):      Goals and Risk Factor Review - 10/23/16 1045      Core Components/Risk Factors/Patient Goals Review   Personal Goals Review Increase Strength and Stamina;Sedentary;Improve shortness of breath with ADL's;Hypertension;Increase knowledge of respiratory medications and ability to use respiratory devices properly.;Develop more efficient breathing techniques such as purse lipped breathing and diaphragmatic breathing and practicing self-pacing with activity.   Review Catherine Landry is happy with her new exercise routine and is now implementing home exercise along with her [facility] routine.  She states she always stays active and has seen an improvement in her strenght and stamina.  Catherine Landry is now able to walk longer distances without using her O2 tank.  Catherine Landry has a good knowledge of her respiratory meds, Advair and rescue inhaler, Combivent, and theophylline.  She is not having to use her rescue inhaler often.  Her Blood Pressure has been within normal limits at [employer].  Catherine Landry demonstrates pursed-lip breathing in [facility] class.     Expected Outcomes Continue to manage her COPD and using O2 at night to maintain acceptable O2 saturations while she sleeps.  Continue to attend [facility] and improve strength and stamina.       ITP Comments:     ITP Comments    Row Name 08/21/16 1100 08/31/16 1525 09/09/16 0805 09/11/16 1049 09/30/16 1157   ITP Comments "Know Your Numbers"  education was completed today with learning goals met.  Pt able to follow exercise prescription today without complaint.  Will continue to monitor for progression. Catherine Landry did  meet with Dr Emily Filbert for her face-to-face encounter.  Catherine Landry called and will be out for the week. Her husband had a fall and is in the hospital. Catherine Landry reported her battery died so her husband took out her car battery to check it in his car. He passed out doing that and was admitted to the hospital. They still do not know why he passed out to go see his Cardiologist. Catherine Landry of course reported it was stressfull to find him passed out. Face to face meeting with Dr. Sabra Heck today.   Row Name 10/13/16 1051 10/21/16 1138         ITP Comments Catherine Callejo will be out until next Wednesday 10/21/16 due to a medical test and her husband's physician appointment. Catherine. Janeway had her 30 day face to face with Dr. Sabra Heck.          Comments:  30 Day Note Review

## 2016-11-02 NOTE — Telephone Encounter (Signed)
FYI for you from pt on message below. Pt states all weekend she has been SOB worse. Went to pulm rehab to do SMW and couldn't do SMW and states level went low to 60%. She is using breathing tx and has chest tightness. Pt also states she has UTI.

## 2016-11-02 NOTE — Telephone Encounter (Signed)
Pt is calling to let Dr. Ashby Dawes know she started her Prednisone this morning.

## 2016-11-03 NOTE — Telephone Encounter (Signed)
Continue the prednisone and keep [place] appraised of progress.

## 2016-11-04 VITALS — Ht 62.5 in | Wt 144.0 lb

## 2016-11-04 DIAGNOSIS — J438 Other emphysema: Secondary | ICD-10-CM | POA: Diagnosis not present

## 2016-11-04 DIAGNOSIS — J439 Emphysema, unspecified: Secondary | ICD-10-CM

## 2016-11-04 NOTE — Telephone Encounter (Signed)
LMOVM for pt to call back to keep [place] informed. Nothing further needed.

## 2016-11-04 NOTE — Progress Notes (Signed)
Daily Session Note  Patient Details  Name: Catherine Landry MRN: [id number] Date of Birth: 1936-06-16 Referring Provider:   Flowsheet Row Pulmonary Rehab from 07/14/2016 in [facility] Cardiac and Pulmonary Rehab  Referring Provider  Ramachandran      Encounter Date: 11/04/2016  Check In:     Session Check In - 11/04/16 1148      Check-In   Location [facility]-Cardiac & Pulmonary Rehab   Staff Present Carson Myrtle, BS, RRT, Respiratory Lennie Hummer, MA, ACSM RCEP, Exercise Physiologist;Amanda Oletta Darter, BA, ACSM CEP, Exercise Physiologist   Supervising physician immediately available to respond to emergencies [facility] immediately available ER MD   Physician(s) Cinda Quest and Mc[place]   Medication changes reported     No   Fall or balance concerns reported    No   Warm-up and Cool-down Performed as group-led instruction   Resistance Training Performed Yes   VAD Patient? No         Goals Met:  Proper associated with RPD/PD & O2 Sat Independence with exercise equipment Exercise tolerated well Strength training completed today  Goals Unmet:  Not Applicable  Comments:      [place] Name 07/14/16 1300 07/14/16 1329 09/02/16 1221     6 Minute Walk   Phase  -  - Mid Program   Distance 712 feet 712 feet 900 feet   Distance % Change  -  - 26 %   Walk Time 4.75 minutes 4.75 minutes 5.3 minutes   # of Rest Breaks 3 3 1    MPH 1.7 1.7 1.93   METS 1.43 2.3 2.45   RPE 15 15 12    Perceived Dyspnea  4 4 2    VO2 Peak 5.02 5.02 6.7   Symptoms Yes (comment) Yes (comment) No   Comments Short of breath short of breath  -   Resting HR 78 bpm 78 bpm 85 bpm   Resting BP 134/72 134/72 128/70   Max Ex. HR 102 bpm 102 bpm 103 bpm   Max Ex. BP 134/60 134/60 152/64     Interval HR   Baseline HR 78 78  -   1 Minute HR 98 98  -   2 Minute HR 102 102  -   3 Minute HR 98 98  -   4 Minute HR 102 102  -   5 Minute HR 96 96  -   6 Minute HR 101 101  -   2 Minute Post HR 87  87  -   Interval Heart Rate? Yes Yes  -     Interval Oxygen   Interval Oxygen? Yes  -  -   Baseline Oxygen Saturation % 95 % 95 %  -   Baseline Liters of Oxygen 3 L 3 L  -   1 Minute Oxygen Saturation % 85 % 85 %  -   1 Minute Liters of Oxygen 3 L 3 L  -   2 Minute Oxygen Saturation % 86 % 86 %  -   2 Minute Liters of Oxygen 3 L 3 L  -   3 Minute Oxygen Saturation % 86 % 86 %  -   3 Minute Liters of Oxygen 3 L 3 L  -   4 Minute Oxygen Saturation % 85 % 85 %  -   4 Minute Liters of Oxygen 3 L 3 L  -   5 Minute Oxygen Saturation % 86 % 86 %  -  5 Minute Liters of Oxygen 3 L 3 L  -   6 Minute Oxygen Saturation % 83 % 83 %  -   6 Minute Liters of Oxygen 3 L 3 L  -   2 Minute Post Oxygen Saturation % 94 % 94 %  -   2 Minute Post Liters of Oxygen 3 L 3 L  -   Row Name 11/04/16 1149         6 Minute Walk   Phase Discharge     Distance 925 feet     Distance % Change 30 %     Walk Time 5 minutes     # of Rest Breaks 0     MPH 2.1     METS 2.12     RPE 12     Perceived Dyspnea  1     VO2 Peak 7.4     Symptoms No     Resting HR 86 bpm     Resting BP 132/78     Max Ex. HR 116 bpm     Max Ex. BP 150/66       Interval HR   Baseline HR 86     1 Minute HR 78     2 Minute HR 101     3 Minute HR 102     6 Minute HR 116       Interval Oxygen   Baseline Oxygen Saturation % 91 %     Baseline Liters of Oxygen 3 L     1 Minute Oxygen Saturation % 93 %     1 Minute Liters of Oxygen 3 L     2 Minute Oxygen Saturation % 93 %     2 Minute Liters of Oxygen 3 L     3 Minute Oxygen Saturation % 91 %     3 Minute Liters of Oxygen 3 L     4 Minute Oxygen Saturation % 85 %     4 Minute Liters of Oxygen 3 L     5 Minute Oxygen Saturation % 91 %     5 Minute Liters of Oxygen 3 L     6 Minute Oxygen Saturation % 93 %     6 Minute Liters of Oxygen 3 L     2 Minute Post Liters of Oxygen 3 L          Dr. Emily Filbert is Medical Director for [place] and  [facility] Pulmonary Rehabilitation.

## 2016-11-06 DIAGNOSIS — J438 Other emphysema: Secondary | ICD-10-CM | POA: Diagnosis not present

## 2016-11-06 DIAGNOSIS — J439 Emphysema, unspecified: Secondary | ICD-10-CM

## 2016-11-06 NOTE — Progress Notes (Signed)
Daily Session Note  Patient Details  Name: Catherine Landry MRN: [id number] Date of Birth: 02-Sep-1936 Referring Provider:   Flowsheet Row Pulmonary Rehab from 07/14/2016 in [facility] Cardiac and Pulmonary Rehab  Referring Provider  Ramachandran      Encounter Date: 11/06/2016  Check In:     Session Check In - 11/06/16 1145      Check-In   Location [facility]-Cardiac & Pulmonary Rehab   Staff Present Gerlene Burdock, RN, Vickki Hearing, BA, ACSM CEP, Exercise Physiologist;Other  Darel Hong RN   Supervising physician immediately available to respond to emergencies [facility] immediately available ER MD   Physician(s) Darl Householder and [place]   Medication changes reported     No   Fall or balance concerns reported    No   Warm-up and Cool-down Performed as group-led instruction   Resistance Training Performed Yes   VAD Patient? No     Pain Assessment   Currently in Pain? No/denies         Goals Met:  Proper associated with RPD/PD & O2 Sat Independence with exercise equipment Exercise tolerated well Strength training completed today  Goals Unmet:  Not Applicable  Comments: Pt able to follow exercise prescription today without complaint.  Will continue to monitor for progression.    Dr. Emily Filbert is Medical Director for [place] and [facility] Pulmonary Rehabilitation.

## 2016-11-09 ENCOUNTER — Telehealth: Payer: Self-pay | Admitting: Internal Medicine

## 2016-11-09 ENCOUNTER — Encounter: Payer: Medicare Other | Admitting: *Deleted

## 2016-11-09 DIAGNOSIS — J438 Other emphysema: Secondary | ICD-10-CM | POA: Diagnosis not present

## 2016-11-09 DIAGNOSIS — J439 Emphysema, unspecified: Secondary | ICD-10-CM

## 2016-11-09 NOTE — Telephone Encounter (Signed)
Pt states she finished her Prednisone on Saturday. States she is still not any better. States she needs a Zpak and Prednisone. States she is also out of her nebulizer medication, but would like to change to a different medication that is less expensive with her insurance. Please call.

## 2016-11-09 NOTE — Progress Notes (Signed)
Daily Session Note  Patient Details  Name: Catherine Landry MRN: [id number] Date of Birth: 1936-08-01 Referring Provider:   Flowsheet Row Pulmonary Rehab from 07/14/2016 in [facility] Cardiac and Pulmonary Rehab  Referring Provider  Ramachandran      Encounter Date: 11/09/2016  Check In:     Session Check In - 11/09/16 1012      Check-In   Location [facility]-Cardiac & Pulmonary Rehab   Staff Present Carson Myrtle, BS, RRT, Respiratory Therapist;Kelly Amedeo Plenty, BS, ACSM CEP, Exercise Physiologist;Amanda Oletta Darter, BA, ACSM CEP, Exercise Physiologist   Supervising physician immediately available to respond to emergencies [facility] immediately available ER MD   Physician(s) Alfred Levins and Corky Downs   Medication changes reported     No   Fall or balance concerns reported    No   Warm-up and Cool-down Performed on first and last piece of equipment   Resistance Training Performed Yes   VAD Patient? No     Pain Assessment   Currently in Pain? No/denies   Multiple Pain Sites No         Goals Met:  Proper associated with RPD/PD & O2 Sat Independence with exercise equipment Exercise tolerated well Strength training completed today  Goals Unmet:  Not Applicable  Comments: Pt able to follow exercise prescription today without complaint.  Will continue to monitor for progression.    Dr. Emily Filbert is Medical Director for [place] and [facility] Rehabilitation.

## 2016-11-09 NOTE — Telephone Encounter (Signed)
Pt states she was better while on Prednisone but now she not feeling well again. Per DS, pt must come in to be seen appt made. Nothing further needed.

## 2016-11-10 ENCOUNTER — Encounter: Payer: Self-pay | Admitting: Pulmonary Disease

## 2016-11-10 ENCOUNTER — Ambulatory Visit (INDEPENDENT_AMBULATORY_CARE_PROVIDER_SITE_OTHER): Payer: Medicare Other | Admitting: Pulmonary Disease

## 2016-11-10 VITALS — BP 110/70 | HR 85 | Wt 145.0 lb

## 2016-11-10 DIAGNOSIS — J441 Chronic obstructive pulmonary disease with (acute) exacerbation: Secondary | ICD-10-CM

## 2016-11-10 MED ORDER — LEVALBUTEROL HCL 0.63 MG/3ML IN NEBU: 0.6300 mg | INHALATION_SOLUTION | Freq: Four times a day (QID) | RESPIRATORY_TRACT | 5 refills | Status: DC | PRN

## 2016-11-10 MED ORDER — AZITHROMYCIN 250 MG PO TABS: ORAL_TABLET | ORAL | 0 refills | Status: AC

## 2016-11-10 NOTE — Patient Instructions (Addendum)
Maintenance medication:  Advair - one inhalation twice a day  Theodur 300 mg daily  First line rescue medication:  Combivent inhaler  Second line rescue medication:  Xopenex in nebulizer   Azithromycin (Z pak) ordered  Keep your appointment with Dr Ashby Dawes as scheduled

## 2016-11-11 ENCOUNTER — Encounter: Payer: Medicare Other | Admitting: Respiratory Therapy

## 2016-11-11 DIAGNOSIS — J438 Other emphysema: Secondary | ICD-10-CM | POA: Diagnosis not present

## 2016-11-11 DIAGNOSIS — J439 Emphysema, unspecified: Secondary | ICD-10-CM

## 2016-11-11 NOTE — Patient Instructions (Signed)
Discharge Instructions  Patient Details  Name: Catherine Landry MRN: [id number] Date of Birth: [birth year] Referring Provider:  Laverle Hobby, *   Number of Visits: 36  Reason for Discharge:  Patient reached a stable level of exercise. Patient independent in their exercise.  Smoking History:  History  Smoking Status   Former Smoker   Packs/day: 1.50   Quit date: 12/09/1999  Smokeless Tobacco   Never Used    Diagnosis:  Pulmonary emphysema, unspecified emphysema type ([place])  Initial Exercise Prescription:     Initial Exercise Prescription - 07/14/16 1300      Date of Initial Exercise RX and Referring Provider   Date 07/14/16   Referring Provider Ashby Dawes     Oxygen   Oxygen Continuous   Liters 3     Treadmill   MPH 1.5   Grade 0   Minutes 15  3/3/3/3   METs 2.15     REL-XR   Level 2   Minutes 15   METs 2     T5 Nustep   Level 1   Minutes 15   METs 2     Prescription Details   Frequency (times per week) 3   Duration Progress to 45 minutes of aerobic exercise without signs/symptoms of physical distress     Intensity   THRR 40-80% of Max Heartrate 104-129   Ratings of Perceived Exertion 11-13   Perceived Dyspnea 0-4     Progression   Progression Continue to progress workloads to maintain intensity without signs/symptoms of physical distress.     Resistance Training   Training Prescription Yes   Weight 2   Reps 10-12      Discharge Exercise Prescription (Final Exercise Prescription Changes):     Exercise Prescription Changes - 11/05/16 1100      Response to Exercise   Blood Pressure (Admit) 132/78   Blood Pressure (Exercise) 144/60   Blood Pressure (Exit) 124/64   Heart Rate (Admit) 86 bpm   Heart Rate (Exercise) 99 bpm   Heart Rate (Exit) 84 bpm   Oxygen Saturation (Admit) 91 %   Oxygen Saturation (Exercise) 94 %   Oxygen Saturation (Exit) 96 %   Rating of Perceived Exertion (Exercise) 10   Perceived Dyspnea  (Exercise) 2   Duration Progress to 45 minutes of aerobic exercise without signs/symptoms of physical distress   Intensity THRR unchanged     Progression   Progression Continue to progress workloads to maintain intensity without signs/symptoms of physical distress.     Resistance Training   Training Prescription Yes   Weight 2   Reps 10-12     Oxygen   Oxygen Continuous   Liters 3     NuStep   Level 4   Minutes 15     REL-XR   Level 2   Minutes 15   METs 3.3      Functional Capacity:     6 Minute Walk    Row Name 07/14/16 1300 07/14/16 1329 09/02/16 1221     6 Minute Walk   Phase  --  -- Mid Program   Distance 712 feet 712 feet 900 feet   Distance % Change  --  -- 26 %   Walk Time 4.75 minutes 4.75 minutes 5.3 minutes   # of Rest Breaks 3 3 1    MPH 1.7 1.7 1.93   METS 1.43 2.3 2.45   RPE 15 15 12    Perceived Dyspnea  4 4 2    VO2 Peak  5.02 5.02 6.7   Symptoms Yes (comment) Yes (comment) No   Comments Short of breath short of breath  --   Resting HR 78 bpm 78 bpm 85 bpm   Resting BP 134/72 134/72 128/70   Max Ex. HR 102 bpm 102 bpm 103 bpm   Max Ex. BP 134/60 134/60 152/64     Interval HR   Baseline HR 78 78  --   1 Minute HR 98 98  --   2 Minute HR 102 102  --   3 Minute HR 98 98  --   4 Minute HR 102 102  --   5 Minute HR 96 96  --   6 Minute HR 101 101  --   2 Minute Post HR 87 87  --   Interval Heart Rate? Yes Yes  --     Interval Oxygen   Interval Oxygen? Yes  --  --   Baseline Oxygen Saturation % 95 % 95 %  --   Baseline Liters of Oxygen 3 L 3 L  --   1 Minute Oxygen Saturation % 85 % 85 %  --   1 Minute Liters of Oxygen 3 L 3 L  --   2 Minute Oxygen Saturation % 86 % 86 %  --   2 Minute Liters of Oxygen 3 L 3 L  --   3 Minute Oxygen Saturation % 86 % 86 %  --   3 Minute Liters of Oxygen 3 L 3 L  --   4 Minute Oxygen Saturation % 85 % 85 %  --   4 Minute Liters of Oxygen 3 L 3 L  --   5 Minute Oxygen Saturation % 86 % 86 %  --   5 Minute  Liters of Oxygen 3 L 3 L  --   6 Minute Oxygen Saturation % 83 % 83 %  --   6 Minute Liters of Oxygen 3 L 3 L  --   2 Minute Post Oxygen Saturation % 94 % 94 %  --   2 Minute Post Liters of Oxygen 3 L 3 L  --   Row Name 11/04/16 1149         6 Minute Walk   Phase Discharge     Distance 925 feet     Distance % Change 30 %     Walk Time 5 minutes     # of Rest Breaks 0     MPH 2.1     METS 2.12     RPE 12     Perceived Dyspnea  1     VO2 Peak 7.4     Symptoms No     Resting HR 86 bpm     Resting BP 132/78     Max Ex. HR 116 bpm     Max Ex. BP 150/66       Interval HR   Baseline HR 86     1 Minute HR 78     2 Minute HR 101     3 Minute HR 102     6 Minute HR 116       Interval Oxygen   Baseline Oxygen Saturation % 91 %     Baseline Liters of Oxygen 3 L     1 Minute Oxygen Saturation % 93 %     1 Minute Liters of Oxygen 3 L     2 Minute Oxygen Saturation % 93 %  2 Minute Liters of Oxygen 3 L     3 Minute Oxygen Saturation % 91 %     3 Minute Liters of Oxygen 3 L     4 Minute Oxygen Saturation % 85 %     4 Minute Liters of Oxygen 3 L     5 Minute Oxygen Saturation % 91 %     5 Minute Liters of Oxygen 3 L     6 Minute Oxygen Saturation % 93 %     6 Minute Liters of Oxygen 3 L     2 Minute Post Liters of Oxygen 3 L        Quality of Life:     Quality of Life - 10/28/16 1443      Quality of Life Scores   Health/Function Pre 21 %   Health/Function Post 20.75 %   Health/Function % Change -1.19 %   Socioeconomic Pre 21 %   Socioeconomic Post 20.71 %   Socioeconomic % Change  -1.38 %   Psych/Spiritual Pre 21 %   Psych/Spiritual Post 21 %   Psych/Spiritual % Change 0 %   Family Pre 21 %   Family Post 21 %   Family % Change 0 %   GLOBAL Pre 21 %   GLOBAL Post 20.83 %   GLOBAL % Change -0.81 %      Personal Goals: Goals established at orientation with interventions provided to work toward goal.     Personal Goals and Risk Factors at Admission -  07/14/16 1257      Core Components/Risk Factors/Patient Goals on Admission   Sedentary Yes  Likes to walk and camp   Intervention Provide advice, education, support and counseling about physical activity/exercise needs.;Develop an individualized exercise prescription for aerobic and resistive training based on initial evaluation findings, risk stratification, comorbidities and participant's personal goals.   Expected Outcomes Achievement of increased cardiorespiratory fitness and enhanced flexibility, muscular endurance and strength shown through measurements of functional capacity and personal statement of participant.   Increase Strength and Stamina Yes   Intervention Provide advice, education, support and counseling about physical activity/exercise needs.;Develop an individualized exercise prescription for aerobic and resistive training based on initial evaluation findings, risk stratification, comorbidities and participant's personal goals.   Expected Outcomes Achievement of increased cardiorespiratory fitness and enhanced flexibility, muscular endurance and strength shown through measurements of functional capacity and personal statement of participant.   Improve shortness of breath with ADL's Yes   Intervention Provide education, individualized exercise plan and daily activity instruction to help decrease symptoms of SOB with activities of daily living.   Expected Outcomes Short Term: Achieves a reduction of symptoms when performing activities of daily living.   Develop more efficient breathing techniques such as purse lipped breathing and diaphragmatic breathing; and practicing self-pacing with activity Yes   Intervention Provide education, demonstration and support about specific breathing techniuqes utilized for more efficient breathing. Include techniques such as pursed lipped breathing, diaphragmatic breathing and self-pacing activity.   Expected Outcomes Short Term: Participant will be  able to demonstrate and use breathing techniques as needed throughout daily activities.   Increase knowledge of respiratory medications and ability to use respiratory devices properly  Yes  Advair, Combivent Respimat, SVN Albuterol; oxygen 2l/m at night and as needed   Intervention Provide education and demonstration as needed of appropriate use of medications, inhalers, and oxygen therapy.   Expected Outcomes Short Term: Achieves understanding of medications use. Understands that oxygen is a medication  prescribed by physician. Demonstrates appropriate use of inhaler and oxygen therapy.   Hypertension Yes   Intervention Provide education on lifestyle modifcations including regular physical activity/exercise, weight management, moderate sodium restriction and increased consumption of fresh fruit, vegetables, and low fat dairy, alcohol moderation, and smoking cessation.;Monitor prescription use compliance.   Expected Outcomes Short Term: Continued assessment and intervention until BP is < 140/68mm HG in hypertensive participants. < 130/60mm HG in hypertensive participants with diabetes, heart failure or chronic kidney disease.;Long Term: Maintenance of blood pressure at goal levels.       Personal Goals Discharge:     Goals and Risk Factor Review - 11/11/16 1523      Core Components/Risk Factors/Patient Goals Review   Expected Outcomes Continue exercising in [employer] and continue self-management of her COPD from the knowledge she ha gained in [place].      Nutrition & Weight - Outcomes:     Pre Biometrics - 07/14/16 1257      Pre Biometrics   Height 5' 2.5" (1.588 m)   Weight 147 lb 12.8 oz (67 kg)   Waist Circumference 33.5 inches   Hip Circumference 42.5 inches   Waist to Hip Ratio 0.79 %   BMI (Calculated) 26.7         Post Biometrics - 11/04/16 1149       Post  Biometrics   Height 5' 2.5" (1.588 m)   Weight 144 lb (65.3 kg)   Waist Circumference 32 inches   Hip  Circumference 42 inches   Waist to Hip Ratio 0.76 %   BMI (Calculated) 26      Nutrition:   Nutrition Discharge:   Education Questionnaire Score:     Knowledge Questionnaire Score - 10/28/16 1443      Knowledge Questionnaire Score   Post Score 6/10      Goals reviewed with patient; copy given to patient.

## 2016-11-11 NOTE — Progress Notes (Signed)
Daily Session Note  Patient Details  Name: Catherine Landry MRN: [id number] Date of Birth: 1936-09-07 Referring Provider:   Flowsheet Row Pulmonary Rehab from 07/14/2016 in [facility] Cardiac and Pulmonary Rehab  Referring Provider  Ramachandran      Encounter Date: 11/11/2016  Check In:     Session Check In - 11/11/16 1455      Check-In   Location [facility]-Cardiac & Pulmonary Rehab   Staff Present Carson Myrtle, BS, RRT, Respiratory Lennie Hummer, MA, ACSM RCEP, Exercise Physiologist;Amanda Oletta Darter, BA, ACSM CEP, Exercise Physiologist   Supervising physician immediately available to respond to emergencies [facility] immediately available ER MD   Physician(s) Quentin Cornwall and Jimmye Norman   Medication changes reported     No   Fall or balance concerns reported    No   Warm-up and Cool-down Performed on first and last piece of equipment   Resistance Training Performed Yes   VAD Patient? No     Pain Assessment   Currently in Pain? No/denies   Multiple Pain Sites No         Goals Met:  Proper associated with RPD/PD & O2 Sat Independence with exercise equipment Exercise tolerated well No report of cardiac concerns or symptoms Strength training completed today  Goals Unmet:  Not Applicable  Comments: Pt able to follow exercise prescription today without complaint.  Will continue to monitor for progression.   Dr. Emily Filbert is Medical Director for [facility] and [facility].

## 2016-11-13 NOTE — Progress Notes (Signed)
ACUTE PULMONARY OFFICE VISIT  ACUTE PROBLEM: Acute increase in DOE with cough, chest tightness, purulent mucus  CHRONIC PULMONARY PROBLEMS: COPD/emphysema Chronic hypoxic respiratory failure Lung nodule  SUBJ: Pt of DR with above problems. Maintained on Advair and theophylline, PRN Combivent, nebulized BDs if needed, nocturnal O2. 2 weekends ago, she developed chest tightness and cough. She took a course of prednisone (?Pred-pak) with improvement. Then 3 days ago went to Pulmonary Rehab and reported increase in her dyspnea with green mucus. Advised to come here so now seen as an acute visit. Denies CP, fever, hemoptysis, LE edema and calf tenderness but purulent mucus persists. She is already scheduled to see Dr Ashby Dawes in early Mar  OBJ: Vitals:   11/10/16 1127 11/10/16 1132  BP:  110/70  Pulse:  85  SpO2:  94%  Weight: 145 lb (65.8 kg)     Gen: WDWN in NAD HEENT: All WNL Neck: NO LAN, no JVD noted Lungs: Decreased BS, normal percussion note, no wheezes Cardiovascular: Normal rate, regular, no M noted Abdomen: Soft, NT +BS Ext: no C/C/E Neuro: CNs intact, motor/sens grossly intact Skin: No lesions noted   IMPRESSION: Mild COPD exacerbation - benefited from prednisone but still with purulent mucus  PLAN:   1) Azithromycin (Zpak) ordered 2) Continue COPD regimen as previously  Advair and theo as maintenance medications  Combivent is first line rescue  Xopenex nebulized is second line rescue 3) Se is instructed to keep appointment with DR  I have reviewed this patient's medical problems, current medications and therapies and prior pulmonary office notes in evaluation and formulation of the above assessment and plan   Merton Border, MD PCCM service Mobile [phone] Pager [phone] 11/13/2016

## 2016-11-16 ENCOUNTER — Encounter: Payer: Medicare Other | Admitting: *Deleted

## 2016-11-16 ENCOUNTER — Other Ambulatory Visit: Payer: Self-pay | Admitting: Internal Medicine

## 2016-11-16 DIAGNOSIS — J439 Emphysema, unspecified: Secondary | ICD-10-CM

## 2016-11-16 DIAGNOSIS — Z1231 Encounter for screening mammogram for malignant neoplasm of breast: Secondary | ICD-10-CM

## 2016-11-16 DIAGNOSIS — J438 Other emphysema: Secondary | ICD-10-CM | POA: Diagnosis not present

## 2016-11-16 NOTE — Progress Notes (Signed)
Daily Session Note  Patient Details  Name: Catherine Landry MRN: [id number] Date of Birth: 18-Feb-1936 Referring Provider:   Flowsheet Row Pulmonary Rehab from 07/14/2016 in [facility] Cardiac [facility] Pulmonary Rehab  Referring Provider  Ramachandran      Encounter Date: 11/16/2016  Check In:     Session Check In - 11/16/16 1109      Check-In   Location [facility]-Cardiac & Pulmonary Rehab   Staff Present Nada Maclachlan, BA, ACSM CEP, Exercise Physiologist;Laureen Owens Shark, BS, RRT, Respiratory Bertis Ruddy, BS, ACSM CEP, Exercise Physiologist   Supervising physician immediately available to respond to emergencies [facility] immediately available ER MD   Physician(s) Jimmye Norman and Quentin Cornwall   Medication changes reported     No   Fall or balance concerns reported    No   Warm-up and Cool-down Performed as group-led Location manager Performed Yes   VAD Patient? No     Pain Assessment   Currently in Pain? No/denies   Multiple Pain Sites No         History  Smoking Status  . Former Smoker  . Packs/day: 1.50  . Quit date: 12/09/1999  Smokeless Tobacco  . Never Used    Goals Met:  Proper associated with RPD/PD & O2 Sat Independence with exercise equipment Exercise tolerated well Personal goals reviewed Strength training completed today  Goals Unmet:  Not Applicable  Comments:  Catherine Landry graduated today from cardiac rehab with 36 sessions completed.  Details of the patient's exercise prescription and what She needs to do in order to continue the prescription and progress were discussed with patient.  Patient was given a copy of prescription and goals.  Patient verbalized understanding.  Catherine Landry plans to continue to exercise by join [employer].    Dr. Emily Filbert is Medical Director for [facility] and [facility].

## 2016-11-16 NOTE — Progress Notes (Signed)
Pulmonary Individual Treatment Plan  Patient Details  Name: Catherine Landry MRN: [id number] Date of Birth: Jan 04, 1936 Referring Provider:   Flowsheet Row Pulmonary Rehab from 07/14/2016 in [facility] Cardiac and Pulmonary Rehab  Referring Provider  Ramachandran      Initial Encounter Date:  Flowsheet Row Pulmonary Rehab from 07/14/2016 in [facility] Cardiac and Pulmonary Rehab  Date  07/14/16  Referring Provider  Ashby Dawes      Visit Diagnosis: Pulmonary emphysema, unspecified emphysema type ([place])  Patient's Home Medications on Admission:  Current Outpatient Prescriptions:    ADVAIR DISKUS 250-50 MCG/DOSE AEPB, Inhale 1 puff into the lungs 2 (two) times daily. *Rinse mouth after use*, Disp: , Rfl: 1   albuterol (PROVENTIL) (2.5 MG/3ML) 0.083% nebulizer solution, Take 3-6 mLs by nebulization every 6 (six) hours as needed. For shortness of breath/wheezing., Disp: , Rfl: 5   aspirin EC 81 MG tablet, Take 81 mg by mouth every morning., Disp: , Rfl:    Calcium-Magnesium-Vitamin D (CALCIUM 1200+D3 PO), Take 1 tablet by mouth every morning., Disp: , Rfl:    COMBIVENT RESPIMAT 20-100 MCG/ACT AERS respimat, Inhale 1 puff into the lungs 4 (four) times daily as needed. For COPD., Disp: , Rfl: 5   dicyclomine (BENTYL) 10 MG capsule, Take 10 mg by mouth 2 (two) times daily as needed for muscle spasms. For up to 10 days, Disp: , Rfl:    levalbuterol (XOPENEX) 0.63 MG/3ML nebulizer solution, Take 3 mLs (0.63 mg total) by nebulization every 6 (six) hours as needed for wheezing or shortness of breath., Disp: 3 mL, Rfl: 5   losartan (COZAAR) 50 MG tablet, Take 50 mg by mouth every morning., Disp: , Rfl:    metoprolol succinate (TOPROL-XL) 25 MG 24 hr tablet, Take 25 mg by mouth every morning., Disp: , Rfl:    Multiple Vitamin (MULTIVITAMIN WITH MINERALS) TABS, Take 1 tablet by mouth every morning., Disp: , Rfl:    polyethylene glycol powder (GLYCOLAX/MIRALAX) powder, Take 17 g by mouth daily as  needed for constipation. Mix with 4 to 8 ounces of fluid., Disp: , Rfl:    theophylline (THEO-24) 300 MG 24 hr capsule, Take 1 capsule (300 mg total) by mouth every morning., Disp: 30 capsule, Rfl: 0  Past Medical History: Past Medical History:  Diagnosis Date   Asthma    Bladder cancer ([place]) 2015   Cancer ([place]) 2001   kidney left    COPD (chronic obstructive pulmonary disease) ([place])    Hypertension    Raynaud's disease    Renal insufficiency     Tobacco Use: History  Smoking Status   Former Smoker   Packs/day: 1.50   Quit date: 12/09/1999  Smokeless Tobacco   Never Used    Labs: Recent Review Flowsheet Data    There is no flowsheet data to display.       ADL UCSD:     Pulmonary Assessment Scores    Row Name 07/14/16 1253 09/02/16 1404 10/28/16 1441     ADL UCSD   ADL Phase Entry Mid Exit   SOB Score total 68 56 61   Rest 0 0 1   Walk 2 2 3    Stairs 4 5 5    Bath 3 3 2    Dress 2 2 0   Shop 5 5 5       Pulmonary Function Assessment:     Pulmonary Function Assessment - 07/14/16 1250      Initial Spirometry Results   FVC% 28 %   FEV1%  24 %   FEV1/FVC Ratio 65.06   Comments Test date 07/14/16     Post Bronchodilator Spirometry Results   FVC% 43 %   FEV1% 28 %   FEV1/FVC Ratio 58.31     Breath   Bilateral Breath Sounds Clear;Decreased   Shortness of Breath Yes;Limiting activity;Fear of Shortness of Breath      Exercise Target Goals:    Exercise Program Goal: Individual exercise prescription set with THRR, safety & activity barriers. Participant demonstrates ability to understand and report RPE using BORG scale, to self-measure pulse accurately, and to acknowledge the importance of the exercise prescription.  Exercise Prescription Goal: Starting with aerobic activity 30 plus minutes a day, 3 days per week for initial exercise prescription. Provide home exercise prescription and guidelines that participant acknowledges understanding  prior to discharge.  Activity Barriers & Risk Stratification:     Activity Barriers & Cardiac Risk Stratification - 07/14/16 1249      Activity Barriers & Cardiac Risk Stratification   Activity Barriers Arthritis;Back Problems;Shortness of Breath;Deconditioning;Right Hip Replacement   Cardiac Risk Stratification Moderate      6 Minute Walk:     6 Minute Walk    Row Name 07/14/16 1300 07/14/16 1329 09/02/16 1221     6 Minute Walk   Phase  --  -- Mid Program   Distance 712 feet 712 feet 900 feet   Distance % Change  --  -- 26 %   Walk Time 4.75 minutes 4.75 minutes 5.3 minutes   # of Rest Breaks 3 3 1    MPH 1.7 1.7 1.93   METS 1.43 2.3 2.45   RPE 15 15 12    Perceived Dyspnea  4 4 2    VO2 Peak 5.02 5.02 6.7   Symptoms Yes (comment) Yes (comment) No   Comments Short of breath short of breath  --   Resting HR 78 bpm 78 bpm 85 bpm   Resting BP 134/72 134/72 128/70   Max Ex. HR 102 bpm 102 bpm 103 bpm   Max Ex. BP 134/60 134/60 152/64     Interval HR   Baseline HR 78 78  --   1 Minute HR 98 98  --   2 Minute HR 102 102  --   3 Minute HR 98 98  --   4 Minute HR 102 102  --   5 Minute HR 96 96  --   6 Minute HR 101 101  --   2 Minute Post HR 87 87  --   Interval Heart Rate? Yes Yes  --     Interval Oxygen   Interval Oxygen? Yes  --  --   Baseline Oxygen Saturation % 95 % 95 %  --   Baseline Liters of Oxygen 3 L 3 L  --   1 Minute Oxygen Saturation % 85 % 85 %  --   1 Minute Liters of Oxygen 3 L 3 L  --   2 Minute Oxygen Saturation % 86 % 86 %  --   2 Minute Liters of Oxygen 3 L 3 L  --   3 Minute Oxygen Saturation % 86 % 86 %  --   3 Minute Liters of Oxygen 3 L 3 L  --   4 Minute Oxygen Saturation % 85 % 85 %  --   4 Minute Liters of Oxygen 3 L 3 L  --   5 Minute Oxygen Saturation % 86 % 86 %  --  5 Minute Liters of Oxygen 3 L 3 L  --   6 Minute Oxygen Saturation % 83 % 83 %  --   6 Minute Liters of Oxygen 3 L 3 L  --   2 Minute Post Oxygen Saturation % 94 % 94  %  --   2 Minute Post Liters of Oxygen 3 L 3 L  --   Row Name 11/04/16 1149         6 Minute Walk   Phase Discharge     Distance 925 feet     Distance % Change 30 %     Walk Time 5 minutes     # of Rest Breaks 0     MPH 2.1     METS 2.12     RPE 12     Perceived Dyspnea  1     VO2 Peak 7.4     Symptoms No     Resting HR 86 bpm     Resting BP 132/78     Max Ex. HR 116 bpm     Max Ex. BP 150/66       Interval HR   Baseline HR 86     1 Minute HR 78     2 Minute HR 101     3 Minute HR 102     6 Minute HR 116       Interval Oxygen   Baseline Oxygen Saturation % 91 %     Baseline Liters of Oxygen 3 L     1 Minute Oxygen Saturation % 93 %     1 Minute Liters of Oxygen 3 L     2 Minute Oxygen Saturation % 93 %     2 Minute Liters of Oxygen 3 L     3 Minute Oxygen Saturation % 91 %     3 Minute Liters of Oxygen 3 L     4 Minute Oxygen Saturation % 85 %     4 Minute Liters of Oxygen 3 L     5 Minute Oxygen Saturation % 91 %     5 Minute Liters of Oxygen 3 L     6 Minute Oxygen Saturation % 93 %     6 Minute Liters of Oxygen 3 L     2 Minute Post Liters of Oxygen 3 L       Oxygen Initial Assessment:   Oxygen Re-Evaluation:   Oxygen Discharge (Final Oxygen Re-Evaluation):   Initial Exercise Prescription:     Initial Exercise Prescription - 07/14/16 1300      Date of Initial Exercise RX and Referring Provider   Date 07/14/16   Referring Provider Ashby Dawes     Oxygen   Oxygen Continuous   Liters 3     Treadmill   MPH 1.5   Grade 0   Minutes 15  3/3/3/3   METs 2.15     REL-XR   Level 2   Minutes 15   METs 2     T5 Nustep   Level 1   Minutes 15   METs 2     Prescription Details   Frequency (times per week) 3   Duration Progress to 45 minutes of aerobic exercise without signs/symptoms of physical distress     Intensity   THRR 40-80% of Max Heartrate 104-129   Ratings of Perceived Exertion 11-13   Perceived Dyspnea 0-4     Progression    Progression Continue to progress workloads  to maintain intensity without signs/symptoms of physical distress.     Resistance Training   Training Prescription Yes   Weight 2   Reps 10-12      Perform Capillary Blood Glucose checks as needed.  Exercise Prescription Changes:     Exercise Prescription Changes    Row Name 07/27/16 1200 07/30/16 1100 08/11/16 1500 08/26/16 1300 09/02/16 1200     Response to Exercise   Blood Pressure (Admit) 114/60 126/56  -- 142/72  --   Blood Pressure (Exercise) 130/80 124/64 144/70 134/66  --   Blood Pressure (Exit) 130/68 128/70 120/80 144/78  --   Heart Rate (Admit) 82 bpm 82 bpm  -- 88 bpm  --   Heart Rate (Exercise) 102 bpm 97 bpm 95 bpm 112 bpm  --   Heart Rate (Exit) 90 bpm 70 bpm 90 bpm 80 bpm  --   Oxygen Saturation (Admit) 95 % 95 % 94 % 93 %  --   Oxygen Saturation (Exercise) 86 %  Sat dropped to 86 at RA on TM. W/ 2L of O2 Sat increased 91. 91 % 91 % 93 %  --   Oxygen Saturation (Exit) 93 % 93 % 89 % 97 %  --   Rating of Perceived Exertion (Exercise) 12 12 12 12   --   Perceived Dyspnea (Exercise) 2 0 2 2  --   Duration  --  --  -- Progress to 45 minutes of aerobic exercise without signs/symptoms of physical distress  --   Intensity  --  --  -- THRR unchanged  --     Progression   Progression  -- Continue to progress workloads to maintain intensity without signs/symptoms of physical distress. Continue to progress workloads to maintain intensity without signs/symptoms of physical distress. Continue to progress workloads to maintain intensity without signs/symptoms of physical distress.  --     Horticulturist, commercial Prescription Yes Yes Yes Yes  --   Weight 2 2 2 2   --   Reps 10-12 10-12 10-12 10-12  --     Interval Training   Interval Training  -- No No No  --     Oxygen   Oxygen Continuous Continuous Continuous Continuous  --   Liters 3 2  added om treadmill only 2 2  --     Treadmill   MPH 1.5 1 1.1 1.5  --    Grade 0 0 0 0  --   Minutes 15  3/3/3/3 15 15 15   --   METs 2.15  --  -- 2.15  --     NuStep   Level  -- 1 2 4   --   Minutes  -- 15 15 15   --   METs  --  --  -- 2.5  --     REL-XR   Level 2 2 2 2   --   Minutes 15 15 15 15   --   METs 2 1.7  --  --  --     T5 Nustep   Level 1  --  --  --  --   Minutes 15  --  --  --  --   METs 2  --  --  --  --     Home Exercise Plan   Plans to continue exercise at  --  --  --  -- [employer]   Frequency  --  --  --  -- Add 2 additional days to program exercise  sessions.     Exercise Review   Progression  --  -- Yes Yes  --   Row Name 09/24/16 1200 10/09/16 0900 10/22/16 1300 11/05/16 1100       Response to Exercise   Blood Pressure (Admit) 124/70 138/70 120/70 132/78    Blood Pressure (Exercise) 124/64 130/68 144/76 144/60    Blood Pressure (Exit) 140/68  -- 122/62 124/64    Heart Rate (Admit)  -- 92 bpm 84 bpm 86 bpm    Heart Rate (Exercise) 93 bpm 95 bpm 105 bpm 99 bpm    Heart Rate (Exit) 83 bpm 88 bpm 92 bpm 84 bpm    Oxygen Saturation (Admit)  --  --  -- 91 %    Oxygen Saturation (Exercise) 92 % 95 % 91 % 94 %    Oxygen Saturation (Exit) 93 % 92 % 89 % 96 %    Rating of Perceived Exertion (Exercise) 12 12 11 10     Perceived Dyspnea (Exercise) 2 2 2 2     Duration Progress to 45 minutes of aerobic exercise without signs/symptoms of physical distress Progress to 45 minutes of aerobic exercise without signs/symptoms of physical distress Progress to 45 minutes of aerobic exercise without signs/symptoms of physical distress Progress to 45 minutes of aerobic exercise without signs/symptoms of physical distress    Intensity THRR unchanged THRR unchanged THRR unchanged THRR unchanged      Progression   Progression Continue to progress workloads to maintain intensity without signs/symptoms of physical distress. Continue to progress workloads to maintain intensity without signs/symptoms of physical distress. Continue to progress workloads to  maintain intensity without signs/symptoms of physical distress. Continue to progress workloads to maintain intensity without signs/symptoms of physical distress.      Resistance Training   Training Prescription Yes Yes Yes Yes    Weight 2 2 2 2     Reps 10-12 10-12 10-12 10-12      Interval Training   Interval Training No No No  --      Oxygen   Oxygen Continuous Continuous Continuous Continuous    Liters 2 2 2 3       Treadmill   MPH 1.4 1.5 1.7  --    Grade 0 0 0  --    Minutes 15 15 15   --    METs 2.07 2.15 2.3  --      NuStep   Level 4 --  -- 4    Minutes 15 --  -- 15    METs 2.2 --  --  --      REL-XR   Level  -- 2 2 2     Minutes  -- 15 15 15     METs  -- 3  -- 3.3      Exercise Review   Progression Yes Yes  --  --       Exercise Comments:     Exercise Comments    Row Name 07/14/16 1312 07/27/16 1218 07/30/16 1147 08/11/16 1519 08/26/16 1311   Exercise Comments Ms Gragert began the test on room air but droppped quickly too low to continue the test.  She completed the test on 3L continuous oxygen. First full day of exercise!  Patient was oriented to gym and equipment including functions, settings, policies, and procedures.  Patient's individual exercise prescription and treatment plan were reviewed.  All starting workloads were established based on the results of the 6 minute walk test done at initial orientation visit.  The plan  for exercise progression was also introduced and progression will be customized based on patient's performance and goals. Ms Masseys 02 dropped to 33 on Tm so she has added 2L continuous oxygen on TM only.  O2 sats were 91 with oxygen. Collie Siad is progressing well with exerccise. Collie Siad is progressing well with exercise and has maintained normal )2 levels since adding 2L continuous during exercise.   [place] Name 09/02/16 1215 09/24/16 1244 10/09/16 2774 10/22/16 1309 11/05/16 1139   Exercise Comments  Reviewed home exercise with pt today.  Pt plans to  walk/join [employer] for exercise.  Reviewed THR, pulse, RPE, sign and symptoms, NTG use, and when to call 911 or MD.  Also discussed weather considerations and indoor options.  Pt voiced understanding. Collie Siad is doing well for nearing the midpoint of the program.  Staff will continue to monitor her progress. Collie Siad is progressing well with exercise. Collie Siad is progressing well with exercise and has added speed to the TM. Collie Siad improved her post 6 min walk by 25 feet from mid program.  She plans to join [employer] after [employer].    [place] Name 11/09/16 1145 11/16/16 1155         Exercise Comments Reviewed how [employer] works for [employer] with [employer].    Sunnie graduated today from cardiac rehab with 36 sessions completed.  Details of the patient's exercise prescription and what She needs to do in order to continue the prescription and progress were discussed with patient.  Patient was given a copy of prescription and goals.  Patient verbalized understanding.  Saher plans to continue to exercise by join [employer].         Exercise Goals and Review:   Exercise Goals Re-Evaluation :   Discharge Exercise Prescription (Final Exercise Prescription Changes):     Exercise Prescription Changes - 11/05/16 1100      Response to Exercise   Blood Pressure (Admit) 132/78   Blood Pressure (Exercise) 144/60   Blood Pressure (Exit) 124/64   Heart Rate (Admit) 86 bpm   Heart Rate (Exercise) 99 bpm   Heart Rate (Exit) 84 bpm   Oxygen Saturation (Admit) 91 %   Oxygen Saturation (Exercise) 94 %   Oxygen Saturation (Exit) 96 %   Rating of Perceived Exertion (Exercise) 10   Perceived Dyspnea (Exercise) 2   Duration Progress to 45 minutes of aerobic exercise without signs/symptoms of physical distress   Intensity THRR unchanged     Progression   Progression Continue to progress workloads to maintain intensity without signs/symptoms of physical distress.     Resistance Training   Training Prescription  Yes   Weight 2   Reps 10-12     Oxygen   Oxygen Continuous   Liters 3     NuStep   Level 4   Minutes 15     REL-XR   Level 2   Minutes 15   METs 3.3      Nutrition:  Target Goals: Understanding of nutrition guidelines, daily intake of sodium <1552m, cholesterol <2011m calories 30% from fat and 7% or less from saturated fats, daily to have 5 or more servings of fruits and vegetables.  Biometrics:     Pre Biometrics - 07/14/16 1257      Pre Biometrics   Height 5' 2.5" (1.588 m)   Weight 147 lb 12.8 oz (67 kg)   Waist Circumference 33.5 inches   Hip Circumference 42.5 inches   Waist to Hip Ratio 0.79 %  BMI (Calculated) 26.7         Post Biometrics - 11/04/16 1149       Post  Biometrics   Height 5' 2.5" (1.588 m)   Weight 144 lb (65.3 kg)   Waist Circumference 32 inches   Hip Circumference 42 inches   Waist to Hip Ratio 0.76 %   BMI (Calculated) 26      Nutrition Therapy Plan and Nutrition Goals:   Nutrition Discharge: Rate Your Plate Scores:   Nutrition Goals Re-Evaluation:     Nutrition Goals Re-Evaluation    Row Name 09/25/16 1114 10/23/16 1040           Goals   Comment When given the opportunity, Ms. Pevehouse appreciates the offer to meet with Registered Dietician, but does not desire to do so.  She understands this is a service that is available to her, but respectfully declines.   Ms. Ribaudo understands that a dietician is available for her to meet with, but she declines the offer at this time.  She states she is having a normal appetite and is happy with her weight.           Nutrition Goals Discharge (Final Nutrition Goals Re-Evaluation):     Nutrition Goals Re-Evaluation - 10/23/16 1040      Goals   Comment Ms. Shoaff understands that a dietician is available for her to meet with, but she declines the offer at this time.  She states she is having a normal appetite and is happy with her weight.        Psychosocial: Target Goals:  Acknowledge presence or absence of significant depression and/or stress, maximize coping skills, provide positive support system. Participant is able to verbalize types and ability to use techniques and skills needed for reducing stress and depression.   Initial Review & Psychosocial Screening:     Initial Psych Review & Screening - 07/14/16 [place]? Yes   Comments Ms Weigand has good support from he husband of 54yr. She has faced cancer twice and now COPD, but she has a very positive outlook on life. One concern is her husband's early stages of Alzheimers and his care. Ms MGardineris looking forward to [place]     Barriers   Psychosocial barriers to participate in program The patient should benefit from training in stress management and relaxation.     Screening Interventions   Interventions Encouraged to exercise;Program counselor consult      Quality of Life Scores:     Quality of Life - 10/28/16 1443      Quality of Life Scores   Health/Function Pre 21 %   Health/Function Post 20.75 %   Health/Function % Change -1.19 %   Socioeconomic Pre 21 %   Socioeconomic Post 20.71 %   Socioeconomic % Change  -1.38 %   Psych/Spiritual Pre 21 %   Psych/Spiritual Post 21 %   Psych/Spiritual % Change 0 %   Family Pre 21 %   Family Post 21 %   Family % Change 0 %   GLOBAL Pre 21 %   GLOBAL Post 20.83 %   GLOBAL % Change -0.81 %      PHQ-9: Recent Review Flowsheet Data    [facility]28[facility]2016 07/14/2016   Decreased Interest 0 0   Down, Depressed, Hopeless 0 0   PHQ - 2 Score 0 0   Altered sleeping 0 0  Tired, decreased energy 0 1   Change in appetite 0 1   Feeling bad or failure about yourself  0 0   Trouble concentrating 0 0   Moving slowly or fidgety/restless 0 0   Suicidal thoughts 0 0   PHQ-9 Score 0 2   Difficult doing work/chores Not difficult at all Not difficult at all     Interpretation of Total Score   Total Score Depression Severity:  1-4 = Minimal depression, 5-9 = Mild depression, 10-14 = Moderate depression, 15-19 = Moderately severe depression, 20-27 = Severe depression   Psychosocial Evaluation and Intervention:     Psychosocial Evaluation - 11/02/16 1116      Discharge Psychosocial Assessment & Intervention   Comments Counselor follow up with Ms. Stief Collie Siad) reporting she is experiencing some bronchitis and began taking medication for this today - so not feeling "great."  Collie Siad states this program has been positive for her in that she is walking better and breathing better.  Her spouse has noticed she is stronger and moving better around the house and not depending on her inhaler as much.  Although she was already sleeping well, she stated this has improved some also.  Collie Siad continues to be positive and handles stress with her health with a good attitude, and a strong support system in place.  Counselor commended Collie Siad for her hard work and consistency in exercise and all her progress made while in this program.  She will be walking with her spouse upon discharge to maintain her progress.  Additionally she plans to either join the [employer] class or the gym at [facility] to consistently exercise beyond this program.        Psychosocial Re-Evaluation:     Psychosocial Re-Evaluation    [place] Name 08/12/16 1029 08/19/16 1135 09/11/16 1048 09/25/16 1102 10/23/16 1024     Psychosocial Re-Evaluation   Comments Counselor follow up with Ms. M today reporting she is feeling stronger in general and has been breathing better overall; but was struggling some today with shortness of breath.  She continues to be in a positive mood although she has some stress with her spouse's health declining.  She is unable to go away for Thanksgiving with family but she and her spouse are joining friends at a [employer] and she seemed fine with that option.  Ms. Jerilynn Mages states she is sleeping well and enjoying this program.   Staff will continue to follow with her.   Ms. Jerilynn Mages reports having a good Thanksgiving with friends at a [employer] and her spouse was able to accompany her.  She is reporting more energy and ability to work in her yard now than before coming into this program.  Ms. Jerilynn Mages also was happy to announce that she has not had to use her Nebulizer for the past two weeks and she was actively using it daily for the last few years.  She is proud of her progress and is handling the stress of her spouses's declining health better as a result of her positive self-care.  Counselor commended Ms. M for her progress made and her commitment to exercise and this program.   Collie Siad reported her battery died so her husband took out her car battery to check it in his car. He passed out doing that and was admitted to the hospital. They still do not know why he passed out. Collie Siad of course reported it was stressfull to find him passed out.  Collie Siad reports doing  well with her stress at this time.  She states her husband seems to be handling his mile alzheimer's better, which helps her handle it better as well.  "We just take care of each other", she says.  She is thankful he can still be left home by himself, which is helpful for her.  She continues to have family support, stating she can call her grandchildren anytime.  Collie Siad is in good spirits and has a good outlook on life and on taking care of herself.  Ms. Brunsman continues to do deny any extra stress and states she is sleeping well and has a normal appetite.  She continues to feel good support from her nieces and nephews and grandchildren, who are very supportive and nearby.  She does state that she and her husband have multiple out of town doctor appointments to attend, but finds comfort and strength in prayer and her faith.     Interventions  --  --  -- Encouraged to attend Pulmonary Rehabilitation for the exercise Encouraged to attend Pulmonary Rehabilitation for the exercise   Continue  Psychosocial Services   --  --  -- Yes Yes      Psychosocial Discharge (Final Psychosocial Re-Evaluation):     Psychosocial Re-Evaluation - 10/23/16 1024      Psychosocial Re-Evaluation   Comments Ms. Buchan continues to do deny any extra stress and states she is sleeping well and has a normal appetite.  She continues to feel good support from her nieces and nephews and grandchildren, who are very supportive and nearby.  She does state that she and her husband have multiple out of town doctor appointments to attend, but finds comfort and strength in prayer and her faith.     Interventions Encouraged to attend Pulmonary Rehabilitation for the exercise   Continue Psychosocial Services  Yes      Education: Education Goals: Education classes will be provided on a weekly basis, covering required topics. Participant will state understanding/return demonstration of topics presented.  Learning Barriers/Preferences:     Learning Barriers/Preferences - 07/14/16 1250      Learning Barriers/Preferences   Learning Barriers None   Learning Preferences None      Education Topics: Initial Evaluation Education: - Verbal, written and demonstration of respiratory meds, RPE/PD scales, oximetry and breathing techniques. Instruction on use of nebulizers and MDIs: cleaning and proper use, rinsing mouth with steroid doses and importance of monitoring MDI activations. Flowsheet Row Pulmonary Rehab from 11/11/2016 in [facility] Cardiac and Pulmonary Rehab  Date  07/14/16  Educator  LB  Instruction Review Code  2- meets goals/outcomes      General Nutrition Guidelines/Fats and Fiber: -Group instruction provided by verbal, written material, models and posters to present the general guidelines for heart healthy nutrition. Gives an explanation and review of dietary fats and fiber. Flowsheet Row Pulmonary Rehab from 11/11/2016 in [facility] Cardiac and Pulmonary Rehab  Date  08/10/16  Educator  CR  Instruction  Review Code  2- meets goals/outcomes      Controlling Sodium/Reading Food Labels: -Group verbal and written material supporting the discussion of sodium use in heart healthy nutrition. Review and explanation with models, verbal and written materials for utilization of the food label. Flowsheet Row Pulmonary Rehab from 11/11/2016 in [facility] Cardiac and Pulmonary Rehab  Date  08/17/16  Educator  CR  Instruction Review Code  2- meets goals/outcomes      Exercise Physiology & Risk Factors: - Group verbal and written instruction with models  to review the exercise physiology of the cardiovascular system and associated critical values. Details cardiovascular disease risk factors and the goals associated with each risk factor. Flowsheet Row Pulmonary Rehab from 11/11/2016 in [facility] Cardiac and Pulmonary Rehab  Date  08/05/16  Educator  AS [JH]  Instruction Review Code  2- meets goals/outcomes      Aerobic Exercise & Resistance Training: - Gives group verbal and written discussion on the health impact of inactivity. On the components of aerobic and resistive training programs and the benefits of this training and how to safely progress through these programs. Flowsheet Row Pulmonary Rehab from 11/11/2016 in [facility] Cardiac and Pulmonary Rehab  Date  10/21/16  Educator  [facility]  Instruction Review Code  2- meets goals/outcomes      Flexibility, Balance, General Exercise Guidelines: - Provides group verbal and written instruction on the benefits of flexibility and balance training programs. Provides general exercise guidelines with specific guidelines to those with heart or lung disease. Demonstration and skill practice provided.   Stress Management: - Provides group verbal and written instruction about the health risks of elevated stress, cause of high stress, and healthy ways to reduce stress.   Depression: - Provides group verbal and written instruction on the correlation between heart/lung disease  and depressed mood, treatment options, and the stigmas associated with seeking treatment. Flowsheet Row Pulmonary Rehab from 11/11/2016 in [facility] Cardiac and Pulmonary Rehab  Date  08/24/16  Educator  [facility]  Instruction Review Code  2- meets goals/outcomes      Exercise & Equipment Safety: - Individual verbal instruction and demonstration of equipment use and safety with use of the equipment. Flowsheet Row Pulmonary Rehab from 11/11/2016 in [facility] Cardiac and Pulmonary Rehab  Date  07/27/16  Educator  LB  Instruction Review Code  2- meets goals/outcomes      Infection Prevention: - Provides verbal and written material to individual with discussion of infection control including proper hand washing and proper equipment cleaning during exercise session. Flowsheet Row Pulmonary Rehab from 11/11/2016 in [facility] Cardiac and Pulmonary Rehab  Date  07/27/16  Educator  LB  Instruction Review Code  2- meets goals/outcomes      Falls Prevention: - Provides verbal and written material to individual with discussion of falls prevention and safety. Flowsheet Row Pulmonary Rehab from 11/11/2016 in [facility] Cardiac and Pulmonary Rehab  Date  07/14/16  Educator  LB  Instruction Review Code  2- meets goals/outcomes      Diabetes: - Individual verbal and written instruction to review signs/symptoms of diabetes, desired ranges of glucose level fasting, after meals and with exercise. Advice that pre and post exercise glucose checks will be done for 3 sessions at entry of program.   Chronic Lung Diseases: - Group verbal and written instruction to review new updates, new respiratory medications, new advancements in procedures and treatments. Provide informative websites and "800" numbers of self-education.   Lung Procedures: - Group verbal and written instruction to describe testing methods done to diagnose lung disease. Review the outcome of test results. Describe the treatment choices: Pulmonary Function Tests,  ABGs and oximetry.   Energy Conservation: - Provide group verbal and written instruction for methods to conserve energy, plan and organize activities. Instruct on pacing techniques, use of adaptive equipment and posture/positioning to relieve shortness of breath.   Triggers: - Group verbal and written instruction to review types of environmental controls: home humidity, furnaces, filters, dust mite/pet prevention, HEPA vacuums. To discuss weather changes, air quality  and the benefits of nasal washing. Flowsheet Row Pulmonary Rehab from 11/11/2016 in [facility] Cardiac and Pulmonary Rehab  Date  11/11/16  Educator  LB  Instruction Review Code  2- meets goals/outcomes      Exacerbations: - Group verbal and written instruction to provide: warning signs, infection symptoms, calling MD promptly, preventive modes, and value of vaccinations. Review: effective airway clearance, coughing and/or vibration techniques. Create an Sports administrator. Flowsheet Row Pulmonary Rehab from 11/11/2016 in [facility] Cardiac and Pulmonary Rehab  Date  08/26/16  Educator  LB  Instruction Review Code  2- meets goals/outcomes      Oxygen: - Individual and group verbal and written instruction on oxygen therapy. Includes supplement oxygen, available portable oxygen systems, continuous and intermittent flow rates, oxygen safety, concentrators, and Medicare reimbursement for oxygen. Flowsheet Row Pulmonary Rehab from 11/11/2016 in [facility] Cardiac and Pulmonary Rehab  Date  07/14/16  Educator  LB  Instruction Review Code  2- meets goals/outcomes      Respiratory Medications: - Group verbal and written instruction to review medications for lung disease. Drug class, frequency, complications, importance of spacers, rinsing mouth after steroid MDI's, and proper cleaning methods for nebulizers. Flowsheet Row Pulmonary Rehab from 11/11/2016 in [facility] Cardiac and Pulmonary Rehab  Date  07/14/16  Educator  LB  Instruction Review Code  2-  meets goals/outcomes      AED/CPR: - Group verbal and written instruction with the use of models to demonstrate the basic use of the AED with the basic ABC's of resuscitation. Flowsheet Row Pulmonary Rehab from 11/11/2016 in [facility] Cardiac and Pulmonary Rehab  Date  07/31/16  Educator  C. EnterkinRN  Instruction Review Code  2- meets goals/outcomes      Breathing Retraining: - Provides individuals verbal and written instruction on purpose, frequency, and proper technique of diaphragmatic breathing and pursed-lipped breathing. Applies individual practice skills. Flowsheet Row Pulmonary Rehab from 11/11/2016 in [facility] Cardiac and Pulmonary Rehab  Date  07/27/16  Educator  LB  Instruction Review Code  2- meets goals/outcomes      Anatomy and Physiology of the Lungs: - Group verbal and written instruction with the use of models to provide basic lung anatomy and physiology related to function, structure and complications of lung disease. Flowsheet Row Pulmonary Rehab from 11/11/2016 in [facility] Cardiac and Pulmonary Rehab  Date  11/06/16  Educator  CE  Instruction Review Code  2- meets goals/outcomes      Heart Failure: - Group verbal and written instruction on the basics of heart failure: signs/symptoms, treatments, explanation of ejection fraction, enlarged heart and cardiomyopathy. Flowsheet Row Pulmonary Rehab from 11/11/2016 in [facility] Cardiac and Pulmonary Rehab  Date  11/06/16  Educator  CE  Instruction Review Code  2- meets goals/outcomes      Sleep Apnea: - Individual verbal and written instruction to review Obstructive Sleep Apnea. Review of risk factors, methods for diagnosing and types of masks and machines for OSA.   Anxiety: - Provides group, verbal and written instruction on the correlation between heart/lung disease and anxiety, treatment options, and management of anxiety.   Relaxation: - Provides group, verbal and written instruction about the benefits of relaxation for  patients with heart/lung disease. Also provides patients with examples of relaxation techniques. Flowsheet Row Pulmonary Rehab from 11/11/2016 in [facility] Cardiac and Pulmonary Rehab  Date  10/28/16  Educator  [facility]  Instruction Review Code  2- Meets goals/outcomes      Knowledge Questionnaire Score:     Knowledge  Questionnaire Score - 10/28/16 1443      Knowledge Questionnaire Score   Post Score 6/10       Core Components/Risk Factors/Patient Goals at Admission:     Personal Goals and Risk Factors at Admission - 07/14/16 1257      Core Components/Risk Factors/Patient Goals on Admission   Sedentary Yes  Likes to walk and camp   Intervention Provide advice, education, support and counseling about physical activity/exercise needs.;Develop an individualized exercise prescription for aerobic and resistive training based on initial evaluation findings, risk stratification, comorbidities and participant's personal goals.   Expected Outcomes Achievement of increased cardiorespiratory fitness and enhanced flexibility, muscular endurance and strength shown through measurements of functional capacity and personal statement of participant.   Increase Strength and Stamina Yes   Intervention Provide advice, education, support and counseling about physical activity/exercise needs.;Develop an individualized exercise prescription for aerobic and resistive training based on initial evaluation findings, risk stratification, comorbidities and participant's personal goals.   Expected Outcomes Achievement of increased cardiorespiratory fitness and enhanced flexibility, muscular endurance and strength shown through measurements of functional capacity and personal statement of participant.   Improve shortness of breath with ADL's Yes   Intervention Provide education, individualized exercise plan and daily activity instruction to help decrease symptoms of SOB with activities of daily living.   Expected Outcomes  Short Term: Achieves a reduction of symptoms when performing activities of daily living.   Develop more efficient breathing techniques such as purse lipped breathing and diaphragmatic breathing; and practicing self-pacing with activity Yes   Intervention Provide education, demonstration and support about specific breathing techniuqes utilized for more efficient breathing. Include techniques such as pursed lipped breathing, diaphragmatic breathing and self-pacing activity.   Expected Outcomes Short Term: Participant will be able to demonstrate and use breathing techniques as needed throughout daily activities.   Increase knowledge of respiratory medications and ability to use respiratory devices properly  Yes  Advair, Combivent Respimat, SVN Albuterol; oxygen 2l/m at night and as needed   Intervention Provide education and demonstration as needed of appropriate use of medications, inhalers, and oxygen therapy.   Expected Outcomes Short Term: Achieves understanding of medications use. Understands that oxygen is a medication prescribed by physician. Demonstrates appropriate use of inhaler and oxygen therapy.   Hypertension Yes   Intervention Provide education on lifestyle modifcations including regular physical activity/exercise, weight management, moderate sodium restriction and increased consumption of fresh fruit, vegetables, and low fat dairy, alcohol moderation, and smoking cessation.;Monitor prescription use compliance.   Expected Outcomes Short Term: Continued assessment and intervention until BP is < 140/63m HG in hypertensive participants. < 130/838mHG in hypertensive participants with diabetes, heart failure or chronic kidney disease.;Long Term: Maintenance of blood pressure at goal levels.      Core Components/Risk Factors/Patient Goals Review:      Goals and Risk Factor Review    Row Name 07/27/16 1207 08/10/16 0849 08/24/16 1532 09/25/16 1107 10/23/16 1045     Core Components/Risk  Factors/Patient Goals Review   Personal Goals Review Develop more efficient breathing techniques such as purse lipped breathing and diaphragmatic breathing and practicing self-pacing with activity. Sedentary;Increase Strength and Stamina;Improve shortness of breath with ADL's;Increase knowledge of respiratory medications and ability to use respiratory devices properly.;Develop more efficient breathing techniques such as purse lipped breathing and diaphragmatic breathing and practicing self-pacing with activity.;Hypertension Sedentary;Increase Strength and Stamina;Improve shortness of breath with ADL's;Increase knowledge of respiratory medications and ability to use respiratory devices properly.;Develop more efficient breathing techniques  such as purse lipped breathing and diaphragmatic breathing and practicing self-pacing with activity.;Hypertension Sedentary;Increase Strength and Stamina;Develop more efficient breathing techniques such as purse lipped breathing and diaphragmatic breathing and practicing self-pacing with activity.;Improve shortness of breath with ADL's;Increase knowledge of respiratory medications and ability to use respiratory devices properly.;Hypertension Increase Strength and Stamina;Sedentary;Improve shortness of breath with ADL's;Hypertension;Increase knowledge of respiratory medications and ability to use respiratory devices properly.;Develop more efficient breathing techniques such as purse lipped breathing and diaphragmatic breathing and practicing self-pacing with activity.   Review Pursed lip breathing techniques were explained and discussed with the patient. The patient demonstrated understanding of this breathing technique and when to use it during exercise and ADL's.  Ms Grothe has increased her exercise goals in [place]. She has oxygen at home for sleep, but she has not used it during the day. Walking on the treadmill has been difficult for Ms Shambaugh with O2Sat's in the mid 80's,  so we have added 2-3l/m of oxygen for O2Sat's in the low 90's. Shopping and some housework make tired, so I have encouraged her to check her O2Sat's to check if they are low and if she should be wearing oxyge, pacing.Ms Marcelli has a good understaning of her MDI's and has acceptable BP readings in [facility]. Ms Bressi continues to maintain acceptable BP during [facility]. She is compliant with her MDI's. Recently she felt an exacerbation coming on and immediately called Dr Ashby Dawes who called in a prescription for an antibiotic and prednisone taper. During the treadmill, Ms Seufert is using oxygen to maintain acceptable O2Sat's in the 90's and will also use it with her other exercise goals if needed. Her PLB technique is helpful for activity in [facility] and at home.  Collie Siad continues to maintain BP WNL here at [employer] and continues to be compliant with her MDI's.  She states she has seen an improvement in her ability to complete ADL's and her ability to do more activity with less O2 use.  She also finds herself using her rescue inhaler less now after starting [facility].  She was recently caught off guard at [facility] as a visitor who had to walk long distances without her O2 and was impressed with how well she did with her breathing.  Continuing to practice pursed-lip-breathing in class and at home.   Collie Siad is happy with her new exercise routine and is now implementing home exercise along with her [facility] routine.  She states she always stays active and has seen an improvement in her strenght and stamina.  Collie Siad is now able to walk longer distances without using her O2 tank.  Collie Siad has a good knowledge of her respiratory meds, Advair and rescue inhaler, Combivent, and theophylline.  She is not having to use her rescue inhaler often.  Her Blood Pressure has been within normal limits at [employer].  Collie Siad demonstrates pursed-lip breathing in [facility] class.     Expected Outcomes Patient will use PLB during exercise  and ADL's to help improve oxygen saturations and control SOB.   -- Continue managing her COPD and using oxygen to maintain acceptable O2Sat's. Continue to manage her COPD and using O2 to maintain acceptable O2 saturations.   Continue to manage her COPD and using O2 at night to maintain acceptable O2 saturations while she sleeps.  Continue to attend [facility] and improve strength and stamina.    [place] Name 11/11/16 1505 11/11/16 1523           Core Components/Risk Factors/Patient Goals Review  Personal Goals Review Increase Strength and Stamina;Improve shortness of breath with ADL's;Increase knowledge of respiratory medications and ability to use respiratory devices properly.;Sedentary;Develop more efficient breathing techniques such as purse lipped breathing and diaphragmatic breathing and practicing self-pacing with activity.;Hypertension  --      Review Ms Trumbo is graduating soon. She has progressed with her exercise goals. Her post 62md improved by 2147f- Minimal Importance Difference for COPD is [id number] has combined PLB, pacing, and use of oxygen with her increased activity at home. She has learned to self-adjust her oxygen from 2-3l/m with changes in her activity. Having learned in the exacerbation class, Ms MasBuechlers called her physician promptly to receive treatment and uses her nebulizer as needed. She has a good understanding of her Advair and Symbicort, and is compliant with her blood pressure medications. Ms MasAlexieans to continue exercise in [employer] [place] at [place].  She has attended regularly, has enjoyed the socialization with other Chronic Lung Disease patients, and enjoyed the education.  --      Expected Outcomes  -- Continue exercising in [employer] continue self-management of her COPD from the [place]       Core Components/Risk Factors/Patient Goals at Discharge (Final Review):      Goals and Risk Factor Review - 11/11/16 1523       Core Components/Risk Factors/Patient Goals Review   Expected Outcomes Continue exercising in [place] continue self-management of her COPD [place]    ITP Comments:     ITP Comments    Row Name 08/21/16 1100 08/31/16 1525 09/09/16 0805 09/11/16 1049 09/30/16 1157   ITP Comments "Know Your Numbers" education was completed today with learning goals met.  Pt able to follow exercise prescription today without complaint.  Will continue to monitor for progression. Ms MasGoensd meet with Dr MarEmily Filbertr her face-to-face encounter.  Ms MasBorromeolled and will be out for the week. Her husband had a fall and is in the hospital. SueCollie Siadported her battery died so her husband took out her car battery to check it in his car. He passed out doing that and was admitted to the hospital. They still do not know why he passed out to go see his Cardiologist. SueCollie Siad course reported it was stressfull to find him passed out. Face to face meeting with Dr. MilSabra Heckday.   Row Name 10/13/16 1051 10/21/16 1138         ITP Comments Ms MasDacyll be out until next Wednesday 10/21/16 due to a medical test and her husband's physician appointment. Ms. MasLembergerd her 30 day face to face with Dr. MilSabra Heck        Comments:  EleVioletteaduated today from pulmonary rehab with 36 sessions completed.  Details of the patient's exercise prescription and what She needs to do in order to continue the prescription and progress were discussed with patient.  Patient was given a copy of prescription and goals.  Patient verbalized understanding.  EleBenans to continue to exercise by joining [employer]/week.

## 2016-11-16 NOTE — Progress Notes (Signed)
Discharge Summary  Patient Details  Name: Catherine Landry MRN: [id number] Date of Birth: 1936/03/22 Referring Provider:   Flowsheet Row Pulmonary Rehab from 07/14/2016 in [facility]  Referring Provider  Ramachandran       Number of Visits: 31  Reason for Discharge:  Patient reached a stable level of exercise. Patient independent in their exercise.  Smoking History:  History  Smoking Status   Former Smoker   Packs/day: 1.50   Quit date: 12/09/1999  Smokeless Tobacco   Never Used    Diagnosis:  Pulmonary emphysema, unspecified emphysema type ([place])  ADL [facility]:     Pulmonary Assessment Scores    Row Name 07/14/16 1253 09/02/16 1404 10/28/16 1441     ADL [facility]   ADL Phase Entry Mid Exit   SOB Score total 68 56 61   Rest 0 0 1   Walk 2 2 3    Stairs 4 5 5    Bath 3 3 2    Dress 2 2 0   Shop 5 5 5       Initial Exercise Prescription:     Initial Exercise Prescription - 07/14/16 1300      Date of Initial Exercise RX and Referring Provider   Date 07/14/16   Referring Provider Ashby Dawes     Oxygen   Oxygen Continuous   Liters 3     Treadmill   MPH 1.5   Grade 0   Minutes 15  3/3/3/3   METs 2.15     REL-XR   Level 2   Minutes 15   METs 2     T5 Nustep   Level 1   Minutes 15   METs 2     Prescription Details   Frequency (times per week) 3   Duration Progress to 45 minutes of aerobic exercise without signs/symptoms of physical distress     Intensity   THRR 40-80% of Max Heartrate 104-129   Ratings of Perceived Exertion 11-13   Perceived Dyspnea 0-4     Progression   Progression Continue to progress workloads to maintain intensity without signs/symptoms of physical distress.     Resistance Training   Training Prescription Yes   Weight 2   Reps 10-12      Discharge Exercise Prescription (Final Exercise Prescription Changes):     Exercise Prescription Changes - 11/05/16 1100      Response to Exercise    Blood Pressure (Admit) 132/78   Blood Pressure (Exercise) 144/60   Blood Pressure (Exit) 124/64   Heart Rate (Admit) 86 bpm   Heart Rate (Exercise) 99 bpm   Heart Rate (Exit) 84 bpm   Oxygen Saturation (Admit) 91 %   Oxygen Saturation (Exercise) 94 %   Oxygen Saturation (Exit) 96 %   Rating of Perceived Exertion (Exercise) 10   Perceived Dyspnea (Exercise) 2   Duration Progress to 45 minutes of aerobic exercise without signs/symptoms of physical distress   Intensity THRR unchanged     Progression   Progression Continue to progress workloads to maintain intensity without signs/symptoms of physical distress.     Resistance Training   Training Prescription Yes   Weight 2   Reps 10-12     Oxygen   Oxygen Continuous   Liters 3     NuStep   Level 4   Minutes 15     REL-XR   Level 2   Minutes 15   METs 3.3      Functional Capacity:  [place] Name 07/14/16 1300 07/14/16 1329 09/02/16 1221     6 Minute Walk   Phase  --  -- Mid Program   Distance 712 feet 712 feet 900 feet   Distance % Change  --  -- 26 %   Walk Time 4.75 minutes 4.75 minutes 5.3 minutes   # of Rest Breaks 3 3 1    MPH 1.7 1.7 1.93   METS 1.43 2.3 2.45   RPE 15 15 12    Perceived Dyspnea  4 4 2    VO2 Peak 5.02 5.02 6.7   Symptoms Yes (comment) Yes (comment) No   Comments Short of breath short of breath  --   Resting HR 78 bpm 78 bpm 85 bpm   Resting BP 134/72 134/72 128/70   Max Ex. HR 102 bpm 102 bpm 103 bpm   Max Ex. BP 134/60 134/60 152/64     Interval HR   Baseline HR 78 78  --   1 Minute HR 98 98  --   2 Minute HR 102 102  --   3 Minute HR 98 98  --   4 Minute HR 102 102  --   5 Minute HR 96 96  --   6 Minute HR 101 101  --   2 Minute Post HR 87 87  --   Interval Heart Rate? Yes Yes  --     Interval Oxygen   Interval Oxygen? Yes  --  --   Baseline Oxygen Saturation % 95 % 95 %  --   Baseline Liters of Oxygen 3 L 3 L  --   1 Minute Oxygen Saturation % 85 % 85 %  --    1 Minute Liters of Oxygen 3 L 3 L  --   2 Minute Oxygen Saturation % 86 % 86 %  --   2 Minute Liters of Oxygen 3 L 3 L  --   3 Minute Oxygen Saturation % 86 % 86 %  --   3 Minute Liters of Oxygen 3 L 3 L  --   4 Minute Oxygen Saturation % 85 % 85 %  --   4 Minute Liters of Oxygen 3 L 3 L  --   5 Minute Oxygen Saturation % 86 % 86 %  --   5 Minute Liters of Oxygen 3 L 3 L  --   6 Minute Oxygen Saturation % 83 % 83 %  --   6 Minute Liters of Oxygen 3 L 3 L  --   2 Minute Post Oxygen Saturation % 94 % 94 %  --   2 Minute Post Liters of Oxygen 3 L 3 L  --   Row Name 11/04/16 1149         6 Minute Walk   Phase Discharge     Distance 925 feet     Distance % Change 30 %     Walk Time 5 minutes     # of Rest Breaks 0     MPH 2.1     METS 2.12     RPE 12     Perceived Dyspnea  1     VO2 Peak 7.4     Symptoms No     Resting HR 86 bpm     Resting BP 132/78     Max Ex. HR 116 bpm     Max Ex. BP 150/66       Interval  HR   Baseline HR 86     1 Minute HR 78     2 Minute HR 101     3 Minute HR 102     6 Minute HR 116       Interval Oxygen   Baseline Oxygen Saturation % 91 %     Baseline Liters of Oxygen 3 L     1 Minute Oxygen Saturation % 93 %     1 Minute Liters of Oxygen 3 L     2 Minute Oxygen Saturation % 93 %     2 Minute Liters of Oxygen 3 L     3 Minute Oxygen Saturation % 91 %     3 Minute Liters of Oxygen 3 L     4 Minute Oxygen Saturation % 85 %     4 Minute Liters of Oxygen 3 L     5 Minute Oxygen Saturation % 91 %     5 Minute Liters of Oxygen 3 L     6 Minute Oxygen Saturation % 93 %     6 Minute Liters of Oxygen 3 L     2 Minute Post Liters of Oxygen 3 L        Psychological, QOL, Others - Outcomes: PHQ 2/9: Depression screen [facility] 2/9 10/28/2016 07/14/2016  Decreased Interest 0 0  Down, Depressed, Hopeless 0 0  PHQ - 2 Score 0 0  Altered sleeping 0 0  Tired, decreased energy 0 1  Change in appetite 0 1  Feeling bad or failure about yourself  0 0   Trouble concentrating 0 0  Moving slowly or fidgety/restless 0 0  Suicidal thoughts 0 0  PHQ-9 Score 0 2  Difficult doing work/chores Not difficult at all Not difficult at all    Quality of Life:     Quality of Life - 10/28/16 1443      Quality of Life Scores   Health/Function Pre 21 %   Health/Function Post 20.75 %   Health/Function % Change -1.19 %   Socioeconomic Pre 21 %   Socioeconomic Post 20.71 %   Socioeconomic % Change  -1.38 %   Psych/Spiritual Pre 21 %   Psych/Spiritual Post 21 %   Psych/Spiritual % Change 0 %   Family Pre 21 %   Family Post 21 %   Family % Change 0 %   GLOBAL Pre 21 %   GLOBAL Post 20.83 %   GLOBAL % Change -0.81 %      Personal Goals: Goals established at orientation with interventions provided to work toward goal.     Personal Goals and Risk Factors at Admission - 07/14/16 1257      Core Components/Risk Factors/Patient Goals on Admission   Sedentary Yes  Likes to walk and camp   Intervention Provide advice, education, support and counseling about physical activity/exercise needs.;Develop an individualized exercise prescription for aerobic and resistive training based on initial evaluation findings, risk stratification, comorbidities and participant's personal goals.   Expected Outcomes Achievement of increased cardiorespiratory fitness and enhanced flexibility, muscular endurance and strength shown through measurements of functional capacity and personal statement of participant.   Increase Strength and Stamina Yes   Intervention Provide advice, education, support and counseling about physical activity/exercise needs.;Develop an individualized exercise prescription for aerobic and resistive training based on initial evaluation findings, risk stratification, comorbidities and participant's personal goals.   Expected Outcomes Achievement of increased cardiorespiratory fitness and enhanced flexibility, muscular endurance and strength  shown  through measurements of functional capacity and personal statement of participant.   Improve shortness of breath with ADL's Yes   Intervention Provide education, individualized exercise plan and daily activity instruction to help decrease symptoms of SOB with activities of daily living.   Expected Outcomes Short Term: Achieves a reduction of symptoms when performing activities of daily living.   Develop more efficient breathing techniques such as purse lipped breathing and diaphragmatic breathing; and practicing self-pacing with activity Yes   Intervention Provide education, demonstration and support about specific breathing techniuqes utilized for more efficient breathing. Include techniques such as pursed lipped breathing, diaphragmatic breathing and self-pacing activity.   Expected Outcomes Short Term: Participant will be able to demonstrate and use breathing techniques as needed throughout daily activities.   Increase knowledge of respiratory medications and ability to use respiratory devices properly  Yes  Advair, Combivent Respimat, SVN Albuterol; oxygen 2l/m at night and as needed   Intervention Provide education and demonstration as needed of appropriate use of medications, inhalers, and oxygen therapy.   Expected Outcomes Short Term: Achieves understanding of medications use. Understands that oxygen is a medication prescribed by physician. Demonstrates appropriate use of inhaler and oxygen therapy.   Hypertension Yes   Intervention Provide education on lifestyle modifcations including regular physical activity/exercise, weight management, moderate sodium restriction and increased consumption of fresh fruit, vegetables, and low fat dairy, alcohol moderation, and smoking cessation.;Monitor prescription use compliance.   Expected Outcomes Short Term: Continued assessment and intervention until BP is < 140/4mm HG in hypertensive participants. < 130/50mm HG in hypertensive participants with  diabetes, heart failure or chronic kidney disease.;Long Term: Maintenance of blood pressure at goal levels.       Personal Goals Discharge:     Goals and Risk Factor Review    Row Name 07/27/16 1207 08/10/16 0849 08/24/16 1532 09/25/16 1107 10/23/16 1045     Core Components/Risk Factors/Patient Goals Review   Personal Goals Review Develop more efficient breathing techniques such as purse lipped breathing and diaphragmatic breathing and practicing self-pacing with activity. Sedentary;Increase Strength and Stamina;Improve shortness of breath with ADL's;Increase knowledge of respiratory medications and ability to use respiratory devices properly.;Develop more efficient breathing techniques such as purse lipped breathing and diaphragmatic breathing and practicing self-pacing with activity.;Hypertension Sedentary;Increase Strength and Stamina;Improve shortness of breath with ADL's;Increase knowledge of respiratory medications and ability to use respiratory devices properly.;Develop more efficient breathing techniques such as purse lipped breathing and diaphragmatic breathing and practicing self-pacing with activity.;Hypertension Sedentary;Increase Strength and Stamina;Develop more efficient breathing techniques such as purse lipped breathing and diaphragmatic breathing and practicing self-pacing with activity.;Improve shortness of breath with ADL's;Increase knowledge of respiratory medications and ability to use respiratory devices properly.;Hypertension Increase Strength and Stamina;Sedentary;Improve shortness of breath with ADL's;Hypertension;Increase knowledge of respiratory medications and ability to use respiratory devices properly.;Develop more efficient breathing techniques such as purse lipped breathing and diaphragmatic breathing and practicing self-pacing with activity.   Review Pursed lip breathing techniques were explained and discussed with the patient. The patient demonstrated understanding of  this breathing technique and when to use it during exercise and ADL's.  Ms Tienda has increased her exercise goals in [place]. She has oxygen at home for sleep, but she has not used it during the day. Walking on the treadmill has been difficult for Ms Morcom with O2Sat's in the mid 80's, so we have added 2-3l/m of oxygen for O2Sat's in the low 90's. Shopping and some housework make tired, so I have encouraged her  to check her O2Sat's to check if they are low and if she should be wearing oxyge, pacing.Ms Pembleton has a good understaning of her MDI's and has acceptable BP readings in [facility]. Ms Rehrer continues to maintain acceptable BP during [facility]. She is compliant with her MDI's. Recently she felt an exacerbation coming on and immediately called Dr Ashby Dawes who called in a prescription for an antibiotic and prednisone taper. During the treadmill, Ms Niehaus is using oxygen to maintain acceptable O2Sat's in the 90's and will also use it with her other exercise goals if needed. Her PLB technique is helpful for activity in [facility] and at home.  Collie Siad continues to maintain BP WNL here at [employer] and continues to be compliant with her MDI's.  She states she has seen an improvement in her ability to complete ADL's and her ability to do more activity with less O2 use.  She also finds herself using her rescue inhaler less now after starting [facility].  She was recently caught off guard at [facility] as a visitor who had to walk long distances without her O2 and was impressed with how well she did with her breathing.  Continuing to practice pursed-lip-breathing in class and at home.   Collie Siad is happy with her new exercise routine and is now implementing home exercise along with her [facility] routine.  She states she always stays active and has seen an improvement in her strenght and stamina.  Collie Siad is now able to walk longer distances without using her O2 tank.  Collie Siad has a good knowledge of her respiratory meds,  Advair and rescue inhaler, Combivent, and theophylline.  She is not having to use her rescue inhaler often.  Her Blood Pressure has been within normal limits at [employer].  Collie Siad demonstrates pursed-lip breathing in [facility] class.     Expected Outcomes Patient will use PLB during exercise and ADL's to help improve oxygen saturations and control SOB.   -- Continue managing her COPD and using oxygen to maintain acceptable O2Sat's. Continue to manage her COPD and using O2 to maintain acceptable O2 saturations.   Continue to manage her COPD and using O2 at night to maintain acceptable O2 saturations while she sleeps.  Continue to attend [facility] and improve strength and stamina.    [place] Name 11/11/16 1505 11/11/16 1523           Core Components/Risk Factors/Patient Goals Review   Personal Goals Review Increase Strength and Stamina;Improve shortness of breath with ADL's;Increase knowledge of respiratory medications and ability to use respiratory devices properly.;Sedentary;Develop more efficient breathing techniques such as purse lipped breathing and diaphragmatic breathing and practicing self-pacing with activity.;Hypertension  --      Review Ms Deblase is graduating soon. She has progressed with her exercise goals. Her post 70mwd improved by 29ft - Minimal Importance Difference for COPD is 98.70ft. She has combined PLB, pacing, and use of oxygen with her increased activity at home. She has learned to self-adjust her oxygen from 2-3l/m with changes in her activity. Having learned in the exacerbation class, Ms Meggitt has called her physician promptly to receive treatment and uses her nebulizer as needed. She has a good understanding of her Advair and Symbicort, and is compliant with her blood pressure medications. Ms Parham plans to continue exercise in [employer] and walk at home.  She has attended regularly, has enjoyed the socialization with other Chronic Lung Disease patients, and enjoyed the education.  --  Expected Outcomes  -- Continue exercising in [employer] and continue self-management of her COPD from the knowledge she ha gained in [place].         Nutrition & Weight - Outcomes:     Pre Biometrics - 07/14/16 1257      Pre Biometrics   Height 5' 2.5" (1.588 m)   Weight 147 lb 12.8 oz (67 kg)   Waist Circumference 33.5 inches   Hip Circumference 42.5 inches   Waist to Hip Ratio 0.79 %   BMI (Calculated) 26.7         Post Biometrics - 11/04/16 1149       Post  Biometrics   Height 5' 2.5" (1.588 m)   Weight 144 lb (65.3 kg)   Waist Circumference 32 inches   Hip Circumference 42 inches   Waist to Hip Ratio 0.76 %   BMI (Calculated) 26      Nutrition:   Nutrition Discharge:   Education Questionnaire Score:     Knowledge Questionnaire Score - 10/28/16 1443      Knowledge Questionnaire Score   Post Score 6/10      Goals reviewed with patient; copy given to patient.

## 2016-11-17 ENCOUNTER — Telehealth: Payer: Self-pay | Admitting: Internal Medicine

## 2016-11-17 MED ORDER — LEVALBUTEROL HCL 0.63 MG/3ML IN NEBU: 0.6300 mg | INHALATION_SOLUTION | Freq: Four times a day (QID) | RESPIRATORY_TRACT | 5 refills | Status: DC | PRN

## 2016-11-17 NOTE — Telephone Encounter (Signed)
RX sent. Pt informed. Nothing further needed. 

## 2016-11-17 NOTE — Telephone Encounter (Signed)
*  STAT* If patient is at the pharmacy, call can be transferred to refill team.   1. Which medications need to be refilled? (please list name of each medication and dose if known) levalbuterol (XOPENEX) 0.63 MG/3ML nebulizer solution  2. Which pharmacy/location (including street and city if local pharmacy) is medication to be sent to? [facility] [address]   3. Do they need a 30 day or 90 day supply? [place]

## 2016-11-18 ENCOUNTER — Other Ambulatory Visit: Payer: Self-pay | Admitting: *Deleted

## 2016-11-18 ENCOUNTER — Other Ambulatory Visit: Payer: Self-pay | Admitting: Gastroenterology

## 2016-11-18 DIAGNOSIS — Z1211 Encounter for screening for malignant neoplasm of colon: Secondary | ICD-10-CM

## 2016-11-18 MED ORDER — LEVALBUTEROL HCL 0.63 MG/3ML IN NEBU: 0.6300 mg | INHALATION_SOLUTION | Freq: Four times a day (QID) | RESPIRATORY_TRACT | 5 refills | Status: DC | PRN

## 2016-11-23 ENCOUNTER — Telehealth: Payer: Self-pay | Admitting: Internal Medicine

## 2016-11-23 NOTE — Telephone Encounter (Signed)
Pt states insurance will not cover Xopenex, but will cover Albuterol. Please call to [place]

## 2016-11-23 NOTE — Telephone Encounter (Signed)
[facility]oke [facility] and states pt needs to come and signed medicare form and wait 48 hours and then med can be filled. Nothing further needed.

## 2016-11-24 NOTE — Telephone Encounter (Signed)
Ms Oehlert called and will be out for the week. Her husband had a fall and is in the hospital.    By Karie Fetch

## 2016-11-24 NOTE — Telephone Encounter (Signed)
Ms Balder called and will be out for the week. Her husband had a fall and is in the hospital.    By Karie Fetch

## 2016-12-08 ENCOUNTER — Ambulatory Visit: Payer: Medicare Other | Attending: Pulmonary Disease

## 2016-12-08 DIAGNOSIS — J441 Chronic obstructive pulmonary disease with (acute) exacerbation: Secondary | ICD-10-CM

## 2016-12-08 DIAGNOSIS — R942 Abnormal results of pulmonary function studies: Secondary | ICD-10-CM | POA: Diagnosis not present

## 2016-12-08 DIAGNOSIS — J449 Chronic obstructive pulmonary disease, unspecified: Secondary | ICD-10-CM | POA: Insufficient documentation

## 2016-12-08 MED ORDER — ALBUTEROL SULFATE (2.5 MG/3ML) 0.083% IN NEBU
2.5000 mg | INHALATION_SOLUTION | Freq: Once | RESPIRATORY_TRACT | Status: AC
  Administered 2016-12-08: 2.5 mg via RESPIRATORY_TRACT
  Filled 2016-12-08: qty 3

## 2016-12-13 NOTE — Progress Notes (Signed)
Catherine [place]      Assessment and Plan:  The patient is a 81 year old female with a history of invasive bladder cancer, emphysema, lung nodule, being followed up for COPD and lung nodule.   Lung Nodule. -CT chest in April 2016 Showed 0.7 cmm lung nodule in superior segment of Rt lower lobe  -Repeat scan at [facility] continues to show nodules, on 07/16/16, stable from 07/11/15 --She is going to continue to follow up at the cancer center at [facility] for her CT scans.    Emphysema.  -Chronic severe emphysema. -Continue advair, theophylline, combivent, and prn xopenex nebs. Will add azithromycin 250 mg every M,W,F.  -CBC 10/06/15; absolute eosinophil count 0.5-- continue advair.    Dyspnea. --She is already maximally treated with inhaled medications and continues to experience continued decline in her breathing.  --She has graduated from pulmonary rehab.   Chronic hypoxic respiratory failure.  -Nocturnal hypoxia, continue oxygen at 2L qhs.   Anxiety.  --She get anxious and I think this contributes her dyspnea.   Bladder cancer. -We will need to follow her lung nodule, given risk for metastatic disease.  We discussed advanced directives today; she does want life saving therapies but nothing long term. She does not think that she wants a feeding tube, because her mother had a stroke and was kept alive on a feeding tube for several years, she could not communicate during that time.   Date: 12/13/2016  MRN# [id number] Demeisha Geraghty [birth year]  Referring Physician: Dr. Ginette Pitman.   Catherine [place] is a 81 y.o. old female seen in [place] for chief complaint of:    Chief Complaint  Patient presents with  . Follow-up    SOB w/activity: prod cough w/green mucus: wheezing    HPI:  The patient is a 81 year old female with a history of COPD. She is currently on Advair 250/50, theophylline, Combivent Respimat. She has had 3 exacerbations in the latter part  of 2017 that required treatment but did not lead to hospitalization. She has been referred to pulm rehab at last visit and she notes that his has helped greatly.She required oxygen at 2L in therapy. Since her last visit with me she has had another exacerbation, she was treated with a course of prednisone, with mild relief but came in for a sick visit. At that time she was continue on her current regimen ans askde to use xopenex but this was not covered.   Currently she feels that her breathing is nearly back to baseline. She continues to have a cough productive of green sputum, and has some wheezing. She lives at home with her husband and his health is declining, she gets upset and anxious and she notes that this makes her breathing worse.  She has continue to use advair bid, combivent but she has been advised not to use it more than every 4 hours. She tried stopping theophylline but had to go back on it.    **CT chest 07/16/16; unchanged from previous.  **PFT; 12/08/16, FVC 55%, FEV1 33%, ratio of 44%. Mild improvement with bronchodilator. DLCO of 25%. No lung volumes. Consistent with severe emphysema.  She has a history of bladder cancer, chronic kidney disease, GERD, invasive bladder cancer, and left  nephroureterectomy in 2002. For her last note with oncology in October 2016, her restaging CT was negative for recurrent or metastatic disease.  **CT chest 01/16/16: at [facility] and compared with previous from 07/11/15 Impression: 1. Several scattered reticular and nodular  opacities are new from prior, favored to be infectious/inflammatory in etiology, although at least one nodule is solitary and could conceivably represent a neoplasm in the background setting of emphysema. Recommend 3 month follow-up CT.  **CT chest in April 2016 Showed 0.7 cmm lung nodule in superior segment of Rt lower lobe  **CT chest images and report from 10/10/2015 and compared with previous from from 10/06/2015. There appeared  to be a small right upper lobe nodule, as well as a small area of scarring/pneumonia which appeared to have resolved and the subsequent CT, although the nodule is still present. CT shows significant bilateral emphysema. There is also streaky atelectasis seen in the left base.   Medication:   Reviewed.     Allergies:  Patient has no known allergies.  Review of Systems: Gen:  Denies  fever, sweats, chills HEENT: Denies blurred vision, double vision.  Cvc:  No dizziness, chest pain. Resp:   Denies cough  Gi: Denies swallowing difficulty, stomach pain. Gu:  Denies bladder incontinence, burning urine Ext:   No Joint pain, stiffness. Skin: No skin rash,  hives Endoc:  No polyuria, polydipsia. Psych: No depression, insomnia. Other:  All other systems were reviewed with the patient and were negative other that what is mentioned in the HPI.   Physical Examination:   VS: BP 114/70 (BP Location: Left Arm, Cuff Size: Normal)   Pulse 74   Wt 147 lb (66.7 kg)   SpO2 92%   BMI 26.46 kg/m  on room air. General Appearance: No distress  Neuro:without focal findings,  speech normal,  HEENT: PERRLA, EOM intact.   Pulmonary: normal breath sounds,  Decreased air entry bilaterally. No wheezing.  CardiovascularNormal S1,S2.  No m/r/g.   Abdomen: Benign, Soft, non-tender. Renal:  No costovertebral tenderness  GU:  No performed at this time. Endoc: No evident thyromegaly, no signs of acromegaly. Skin:   warm, no rashes, no ecchymosis  Extremities: normal, no cyanosis, clubbing.  Other findings:    LABORATORY PANEL:   CBC No results for input(s): WBC, HGB, HCT, PLT in the last 168 hours. ------------------------------------------------------------------------------------------------------------------  Chemistries  No results for input(s): NA, K, CL, CO2, GLUCOSE, BUN, CREATININE, CALCIUM, MG, AST, ALT, ALKPHOS, BILITOT in the last 168 hours.  Invalid input(s):  GFRCGP ------------------------------------------------------------------------------------------------------------------  Cardiac Enzymes No results for input(s): TROPONINI in the last 168 hours. ------------------------------------------------------------  RADIOLOGY:  No results found.     Thank  you for the [place] and for allowing [facility] to assist in the care of your patient. Our recommendations are noted above.  Please contact us if we can be of further service.   Marda Stalker, MD.  Board Certified in Internal Medicine, Pulmonary Medicine, [place], and Sleep Medicine.  [facility]  Patricia Pesa, M.D.  Vilinda Boehringer, M.D.  Merton Border, M.D

## 2016-12-14 ENCOUNTER — Encounter: Payer: Self-pay | Admitting: Internal Medicine

## 2016-12-14 ENCOUNTER — Ambulatory Visit (INDEPENDENT_AMBULATORY_CARE_PROVIDER_SITE_OTHER): Payer: Medicare Other | Admitting: Internal Medicine

## 2016-12-14 VITALS — BP 114/70 | HR 74 | Wt 147.0 lb

## 2016-12-14 DIAGNOSIS — J438 Other emphysema: Secondary | ICD-10-CM

## 2016-12-14 DIAGNOSIS — J449 Chronic obstructive pulmonary disease, unspecified: Secondary | ICD-10-CM | POA: Diagnosis not present

## 2016-12-14 MED ORDER — AZITHROMYCIN 250 MG PO TABS: ORAL_TABLET | ORAL | 1 refills | Status: DC

## 2016-12-14 MED ORDER — PREDNISONE 10 MG (21) PO TBPK: ORAL_TABLET | ORAL | 0 refills | Status: DC

## 2016-12-14 NOTE — Addendum Note (Signed)
Addended by: Renelda Mom on: 12/14/2016 09:48 AM   Modules accepted: Orders

## 2016-12-14 NOTE — Patient Instructions (Signed)
--  use combivent no more than every 4 hours.   --Start azithromycin 250 mg every Monday, Wednesday, Friday.   Will give script for prednisone to keep on hand in case she has a flare up.  --Prednisone 10 mg tabs x 21.  Take 6 tablets on day 1 Take 5 tablets on day 2 Take 4 tablets on day 3 Take 3 tablets on day 4 Take 2 tablets on day 5 Take 1 tablet on day 6 then stop.

## 2016-12-30 ENCOUNTER — Ambulatory Visit: Payer: Medicare Other

## 2017-01-19 ENCOUNTER — Ambulatory Visit
Admission: RE | Admit: 2017-01-19 | Discharge: 2017-01-19 | Disposition: A | Discharge status: Home / Self Care | Payer: Medicare Other | Source: Ambulatory Visit | Attending: Internal Medicine | Admitting: Internal Medicine

## 2017-01-19 DIAGNOSIS — Z1231 Encounter for screening mammogram for malignant neoplasm of breast: Secondary | ICD-10-CM | POA: Diagnosis not present

## 2017-02-02 ENCOUNTER — Telehealth: Payer: Self-pay | Admitting: Internal Medicine

## 2017-02-02 NOTE — Telephone Encounter (Signed)
Pt informed that she needs to be seen and was offered an appt but states she can't schedule due to appts for her husband. States if she doesn't get any better she will call back. Informed pt she will have to be seen per DK and nothing will be called in. Pt verbalized understanding. Nothing further needed.

## 2017-02-02 NOTE — Telephone Encounter (Signed)
Pt states she started Prednisone on Thursday, and took her last one this morning. She has done her nebulizer every 6 hours. She states she still has to sleep propped up on pillows. She states she still has a lot of congestion. She states she is still wheezing. Please call.

## 2017-02-02 NOTE — Telephone Encounter (Signed)
Please advise on message below. I can bring pt in with you on Thursday at 12pm or with DS on Friday at 9:45am if want her seen otherwise please advise.

## 2017-02-02 NOTE — Telephone Encounter (Signed)
DS at 945

## 2017-02-05 ENCOUNTER — Ambulatory Visit (INDEPENDENT_AMBULATORY_CARE_PROVIDER_SITE_OTHER): Payer: Medicare Other | Admitting: Pulmonary Disease

## 2017-02-05 ENCOUNTER — Telehealth: Payer: Self-pay | Admitting: Pulmonary Disease

## 2017-02-05 ENCOUNTER — Encounter: Payer: Self-pay | Admitting: Pulmonary Disease

## 2017-02-05 VITALS — BP 124/60 | HR 80 | Resp 16 | Ht 62.5 in | Wt 145.0 lb

## 2017-02-05 DIAGNOSIS — J9621 Acute and chronic respiratory failure with hypoxia: Secondary | ICD-10-CM

## 2017-02-05 DIAGNOSIS — J441 Chronic obstructive pulmonary disease with (acute) exacerbation: Secondary | ICD-10-CM | POA: Diagnosis not present

## 2017-02-05 MED ORDER — LEVOFLOXACIN 500 MG PO TABS: 500.0000 mg | ORAL_TABLET | Freq: Every day | ORAL | 0 refills | Status: AC

## 2017-02-05 MED ORDER — PREDNISONE 10 MG (21) PO TBPK: ORAL_TABLET | ORAL | 0 refills | Status: DC

## 2017-02-05 NOTE — Telephone Encounter (Signed)
Called pharmacy and per DS he states pt needs to continue Levaquin 500 and Advair. Pharmacy verbalized understanding. Nothing further needed.

## 2017-02-05 NOTE — Telephone Encounter (Signed)
Pharmacist states there is a drug interaction with Levoquin 500, and Advair. Please call.

## 2017-02-05 NOTE — Patient Instructions (Addendum)
Continue Advair, theophylline, Xopenex, Combivent as previously Repeat prednisone taper as previously - refill ordered Levofloxacin 500 mg daily for 5 days - prescription ordered Stop azithromycin while on levofloxacin Wear your oxygen as close to 24 hours per day as possible until you feel back to your baseline Follow-up with Dr. Ashby Dawes in 3-4 weeks

## 2017-02-07 NOTE — Progress Notes (Signed)
ACUTE PULMONARY OFFICE VISIT  ACUTE PROBLEM: Acute increase in DOE with cough, chest tightness, purulent mucus  CHRONIC PULMONARY PROBLEMS: COPD/emphysema Chronic hypoxic respiratory failure Lung nodule  SUBJ: Pt of DR with above problems. Maintained on Advair and theophylline, PRN Combivent, nebulized BDs if needed, nocturnal O2. She is seen as an acute visit for 8 days of cough productive of green mucus with increased fatigue. She is now wearing O2 most of day. She took a course of prednisone which she keeps on hand without improvement. Denies CP, fever, hemoptysis, LE edema and calf tenderness.  OBJ: Vitals:   02/05/17 0943  BP: 124/60  Pulse: 80  Resp: 16  SpO2: 92%  Weight: 145 lb (65.8 kg)  Height: 5' 2.5" (1.588 m)  82% on RA initially (immediately after ambulating into room) 92% on 2 LPM [place]  Gen: WDWN in NAD HEENT: All WNL Neck: NO LAN, no JVD noted Lungs: Decreased BS throughout with minimal scattered wheezes Cardiovascular: Normal rate, regular, no M noted Abdomen: Soft, NT +BS Ext: no C/C/E Neuro: CNs intact, motor/sens grossly intact Skin: No lesions noted   IMPRESSION: COPD/emphysema Chronic hypoxic respiratory failure Mild COPD exacerbation with purulent bronchitis  PLAN:   Continue Advair, theophylline, Xopenex, Combivent as previously Repeat prednisone taper as previously - refill ordered Levofloxacin 500 mg daily for 5 days - prescription ordered Stop azithromycin while on levofloxacin Wear oxygen as close to 24 hours per day as possible until she feels back to baseline Follow-up with Dr. Ashby Dawes in 3-4 weeks  CXR ordered prior to that visit  I have reviewed this patient's medical problems, current medications and therapies and prior pulmonary office notes in evaluation and formulation of the above assessment and plan   Merton Border, MD PCCM service Mobile [phone] Pager [phone] 02/07/2017

## 2017-03-10 ENCOUNTER — Ambulatory Visit (INDEPENDENT_AMBULATORY_CARE_PROVIDER_SITE_OTHER): Payer: Medicare Other | Admitting: Internal Medicine

## 2017-03-10 ENCOUNTER — Encounter: Payer: Self-pay | Admitting: Internal Medicine

## 2017-03-10 VITALS — BP 94/58 | HR 85 | Resp 16 | Ht 62.5 in | Wt 143.0 lb

## 2017-03-10 DIAGNOSIS — J9621 Acute and chronic respiratory failure with hypoxia: Secondary | ICD-10-CM | POA: Diagnosis not present

## 2017-03-10 MED ORDER — PREDNISONE 5 MG PO TABS: ORAL_TABLET | ORAL | 0 refills | Status: DC

## 2017-03-10 NOTE — Addendum Note (Signed)
Addended by: Devona Konig on: 03/10/2017 02:08 PM   Modules accepted: Orders

## 2017-03-10 NOTE — Patient Instructions (Addendum)
Prednisone 5 mg tabs.  Take 4 daily for one week.  Then take 3 daily for one week.  Then take 2 daily for one week.  Then take once daily for one week.  Then stop.   Call back in 1 month if not doing better, as you may need to stay on prednisone long term.

## 2017-03-10 NOTE — Addendum Note (Signed)
Addended by: Devona Konig on: 03/10/2017 04:52 PM   Modules accepted: Orders

## 2017-03-10 NOTE — Progress Notes (Signed)
Catherine [facility]      Assessment and Plan:  The patient is a 81 year old female with a history of invasive bladder cancer, emphysema, lung nodule, being followed up for COPD and lung nodule.   Lung Nodule. -CT chest in April 2016 Showed 0.7 cmm lung nodule in superior segment of Rt lower lobe  -Repeat scan at [place] continues to show nodules, on 07/16/16, stable from 07/11/15 --She is going to continue to follow up at the cancer center at [facility] for her CT scans.   Emphysema/COPD with exacerbation.  --Explained that she has been treated with courses of abx, there is no further reason to continue.  --Will restart prednisone. Asked her to call back in one month for progress. If she is not doing better off the prednisone, we may need to keep her on a chronic prednisone dose. -Chronic severe emphysema. -Continue advair, theophylline, combivent, and prn xopenex nebs. Will add azithromycin 250 mg every M,W,F.  -CBC 10/06/15; absolute eosinophil count 0.5-- continue advair.    Dyspnea. --She is already maximally treated with inhaled medications and continues to experience continued decline in her breathing.  --She has graduated from pulmonary rehab.   Chronic hypoxic respiratory failure.  -Nocturnal hypoxia, continue oxygen at 2L qhs.   Anxiety.  --She get anxious and I think this contributes her dyspnea.   Bladder cancer. -We will need to follow her lung nodule, given risk for metastatic disease.  Previously discussed advance directives;  she does want life saving therapies but nothing long term. She does not think that she wants a feeding tube, because her mother had a stroke and was kept alive on a feeding tube for several years, she could not communicate during that time.   Date: 03/10/2017  MRN# [id number] Catherine [facility] [birth year]  Referring Physician: Dr. Ginette Pitman.   Catherine [facility] is a 81 y.o. old female seen in [place] for chief complaint of:      Chief Complaint  Patient presents with  . COPD    f/u from 5/18 pt saw DS pt o2 80% when she got to exam room we put her on 2 L 02.    HPI:  The patient is a 81 year old female with a history of COPD. She is currently on Advair 250/50, theophylline, Combivent Respimat. She has had 3 exacerbations in the latter part of 2017 that required treatment but did not lead to hospitalization. She was seen approximately 1 month ago in our office by Dr. Alva Garnet for COPD exacerbation. She was given a course of Levaquin. She has completed pulmonary rehabilitation, and felt that it was helpful. She required 2 L of oxygen therapy. She returns to the clinic today as a follow-up visit, she notes cough and dyspnea, she is not wearing her oxygen at the time of evaluation. She feels that she has not really gotten over this and has not gone back to her baseline.  She has had sinus drainage, she gets winded with minimal activity. She felt better while on the prednisone. She has continued on the azithro every MWF.   She has continue to use advair bid, combivent but she has been advised not to use it more than every 4 hours. She tried stopping theophylline but had to go back on it.    **CT chest 07/16/16; unchanged from previous.  **PFT; 12/08/16, FVC 55%, FEV1 33%, ratio of 44%. Mild improvement with bronchodilator. DLCO of 25%. No lung volumes. Consistent with severe emphysema.  She has a  history of bladder cancer, chronic kidney disease, GERD, invasive bladder cancer, and left  nephroureterectomy in 2002. For her last note with oncology in October 2016, her restaging CT was negative for recurrent or metastatic disease.  **CT chest 01/16/16: at [facility] and compared with previous from 07/11/15 Impression: 1. Several scattered reticular and nodular opacities are new from prior, favored to be infectious/inflammatory in etiology, although at least one nodule is solitary and could conceivably represent a neoplasm in  the background setting of emphysema. Recommend 3 month follow-up CT.  **CT chest in April 2016 Showed 0.7 cmm lung nodule in superior segment of Rt lower lobe  **CT chest images and report from 10/10/2015 and compared with previous from from 10/06/2015. There appeared to be a small right upper lobe nodule, as well as a small area of scarring/pneumonia which appeared to have resolved and the subsequent CT, although the nodule is still present. CT shows significant bilateral emphysema. There is also streaky atelectasis seen in the left base.   Medication:   Reviewed.     Allergies:  Patient has no known allergies.  Review of Systems: Gen:  Denies  fever, sweats, chills HEENT: Denies blurred vision, double vision.  Cvc:  No dizziness, chest pain. Resp:   Denies cough  Gi: Denies swallowing difficulty, stomach pain. Gu:  Denies bladder incontinence, burning urine Ext:   No Joint pain, stiffness. Skin: No skin rash,  hives Endoc:  No polyuria, polydipsia. Psych: No depression, insomnia. Other:  All other systems were reviewed with the patient and were negative other that what is mentioned in the HPI.   Physical Examination:   VS: BP (!) 94/58 (BP Location: Left Arm, Patient Position: Sitting, Cuff Size: Normal)   Pulse 85   Resp 16   Ht 5' 2.5" (1.588 m)   Wt 64.9 kg (143 lb)   SpO2 93%   BMI 25.74 kg/m  on room air. General Appearance: No distress  Neuro:without focal findings,  speech normal,  HEENT: PERRLA, EOM intact.   Pulmonary: normal breath sounds,  Decreased air entry bilaterally. No wheezing.  CardiovascularNormal S1,S2.  No m/r/g.   Abdomen: Benign, Soft, non-tender. Renal:  No costovertebral tenderness  GU:  No performed at this time. Endoc: No evident thyromegaly, no signs of acromegaly. Skin:   warm, no rashes, no ecchymosis  Extremities: normal, no cyanosis, clubbing.  Other findings:    LABORATORY PANEL:   CBC No results for input(s): WBC, HGB, HCT,  PLT in the last 168 hours. ------------------------------------------------------------------------------------------------------------------  Chemistries  No results for input(s): NA, K, CL, CO2, GLUCOSE, BUN, CREATININE, CALCIUM, MG, AST, ALT, ALKPHOS, BILITOT in the last 168 hours.  Invalid input(s): GFRCGP ------------------------------------------------------------------------------------------------------------------  Cardiac Enzymes No results for input(s): TROPONINI in the last 168 hours. ------------------------------------------------------------  RADIOLOGY:  No results found.     Thank  you for the [place] and for allowing [facility] to assist in the care of your patient. Our recommendations are noted above.  Please contact us if we can be of further service.   Marda Stalker, MD.  Board Certified in Internal Medicine, Pulmonary Medicine, [place], and Sleep Medicine.  [facility]  Patricia Pesa, M.D.  Vilinda Boehringer, M.D.  Merton Border, M.D

## 2017-03-16 ENCOUNTER — Ambulatory Visit: Payer: Medicare Other | Admitting: Internal Medicine

## 2017-03-19 ENCOUNTER — Telehealth: Payer: Self-pay | Admitting: Internal Medicine

## 2017-03-19 NOTE — Telephone Encounter (Signed)
Contacted patient and pt stated that [facility] no longer carried the smaller POC (simply go mini). [facility] was having so many problems with this device that [facility] stopped carrying the device.    Explained to patient that [place] and [facility] had smaller POC's, however, she would have to transfer all her o2 to another company.  Pt stated that this would be ok and asked me to check this out with either company.  Advised patient that I would do so on Monday and would contact her back.  Pt stated that this would be fine.Rhonda J Cobb

## 2017-03-19 NOTE — Telephone Encounter (Signed)
Pt calling to let us know the [facility] does not carry the small oxygen   Would like to know if we can refer her to a company where they do carry the small portable oxygen tanks  Please advise

## 2017-03-25 ENCOUNTER — Other Ambulatory Visit: Payer: Medicare Other

## 2017-03-25 NOTE — Telephone Encounter (Signed)
Noticed that patient is on 02 at night only at this point, however, when patient was in for her last ov on 03/16/17 due to her condition, physician advised her to use 02 during the day if she needed it.    Advised patient that in order to qualify her for a POC, we would need to get her in for a SMW.  Pt states that she is on Prednisone and will be for another 2 weeks.  Advised patient that Dr. Ashby Dawes would be out of town for about 2 wks and to let us check with him about ordering the SMW with the 30 days to her 03/16/17 visit and when she is off the Prednisone.  Pt stated that she was doing better while on the Prednisone and understood why we would need to wait until she completes the course. Advised patient to continue doing what she is doing and once Dr. Ashby Dawes returns, we will address her coming in for SMW to see if she will qualify for o2 during the day. Rhonda J Cobb

## 2017-04-05 ENCOUNTER — Ambulatory Visit
Admission: RE | Admit: 2017-04-05 | Discharge: 2017-04-05 | Disposition: A | Discharge status: Home / Self Care | Payer: Medicare Other | Source: Ambulatory Visit | Attending: Gastroenterology | Admitting: Gastroenterology

## 2017-04-05 DIAGNOSIS — Z1211 Encounter for screening for malignant neoplasm of colon: Secondary | ICD-10-CM

## 2017-04-12 NOTE — Telephone Encounter (Signed)
Pt was seen on 03/10/17. In order to obtain a qualifying walk the walk has to be within 30 days of this visit.    Called and spoke with patient, who stated that she was doing well with her breathing at the present.  Explained to patient that in order to qualify a patient for o2 we need to do a SMW within 30 days of appointment.   Advised patient that we could bring her in for a visit or that we could wait until she has her follow up with Dr. Ashby Dawes in September.    Pt stated that she would just wait until September, that  her breathing is doing fine now.  Pt advised that if she gets SOB before September to contact us. Pt assured me that she would.  Plan for pt is that pt will f/u in September and plan on doing SMW after that visit if necessary.   Nothing else needed at this time. Rhonda J Cobb

## 2017-04-12 NOTE — Telephone Encounter (Signed)
Agree with 6MW to assess oxygen needs.

## 2017-05-12 ENCOUNTER — Ambulatory Visit
Admission: RE | Admit: 2017-05-12 | Discharge: 2017-05-12 | Disposition: A | Discharge status: Home / Self Care | Payer: Medicare Other | Source: Ambulatory Visit | Attending: Family Medicine | Admitting: Family Medicine

## 2017-05-12 ENCOUNTER — Other Ambulatory Visit: Payer: Self-pay | Admitting: Family Medicine

## 2017-05-12 DIAGNOSIS — R51 Headache: Principal | ICD-10-CM

## 2017-05-12 DIAGNOSIS — I6782 Cerebral ischemia: Secondary | ICD-10-CM | POA: Insufficient documentation

## 2017-05-12 DIAGNOSIS — R200 Anesthesia of skin: Secondary | ICD-10-CM

## 2017-05-12 DIAGNOSIS — R52 Pain, unspecified: Secondary | ICD-10-CM | POA: Diagnosis present

## 2017-05-12 DIAGNOSIS — R519 Headache, unspecified: Secondary | ICD-10-CM

## 2017-05-24 ENCOUNTER — Other Ambulatory Visit: Payer: Self-pay | Admitting: Internal Medicine

## 2017-06-10 NOTE — Progress Notes (Signed)
[facility] Consultation      Assessment and Plan:  The patient is a 81 year old female with a history of invasive bladder cancer, emphysema, lung nodule, being followed up for COPD and lung nodule.   Lung Nodule. -CT chest in April 2016 Showed 0.7 cmm lung nodule in superior segment of Rt lower lobe  -Repeat scan at [facility] continues to show nodules, on 07/16/16, stable from 07/11/15 --She is going to continue to follow up at the cancer center at [facility] for her CT scans.   Emphysema/COPD-- Group D with frequent exacerbation.  --No recent exacerbations, continue current regimen, she has stopped taking azithromycin, therefore, this was restarted every other day indefinitely. -We discussed the that she is to call and she is expressing an exacerbation for a prednisone taper. We also discussed the possibility of starting on chronic prednisone in the future. She continues to experience exacerbations or decline in her breathing. -Chronic severe emphysema. -Continue advair, theophylline, combivent, and prn xopenex nebs.-CBC 10/06/15; absolute eosinophil count 0.5-- continue advair.   Orders Placed This Encounter  Procedures  . 6 minute walk    Standing Status:   Future    Standing Expiration Date:   06/15/2018     Dyspnea. --She is already maximally treated with inhaled medications and continues to experience continued decline in her breathing.  --She has graduated from pulmonary rehab.   Chronic hypoxic respiratory failure.  -Nocturnal hypoxia, continue oxygen at 2L qhs.   Anxiety.  --She get anxious and I think this contributes her dyspnea.   Bladder cancer. -We will need to follow her lung nodule, given risk for metastatic disease.  Previously discussed advance directives;  she does want life saving therapies but nothing long term. She does not think that she wants a feeding tube, because her mother had a stroke and was kept alive on a feeding tube for several years,  she could not communicate during that time.   Date: 06/10/2017  MRN# [id number] Catherine Landry 02/01/1936  Referring Physician: Dr. Ginette Pitman.   Catherine Landry is a 81 y.o. old female seen in consultation for chief complaint of:    Chief Complaint  Patient presents with  . COPD  . Shortness of Breath    with exhertion patient is on 2-3 L 24 hrs  . Cough    Pt reports not much.    HPI:  The patient is a 81 year old female with a history of severely symptomatic COPD with frequent exacerbations. She is currently on Advair 250/50, theophylline, Combivent Respimat 4 times per day.   She returns to the clinic today as a follow-up visit, she notes cough and dyspnea, she is not wearing her oxygen at the time of evaluation. She feels that her breathing is doing better today than it has in previous visit.  She continues to get winded with he gets winded with minimal activity. She felt better while on the prednisone. She has run out of the azithro every MWF.   She has continue to use advair bid, combivent but she has been advised not to use it more than every 4 hours. She tried stopping theophylline but had to go back on it.    **CT chest 07/16/16; unchanged from previous.  **PFT; 12/08/16, FVC 55%, FEV1 33%, ratio of 44%. Mild improvement with bronchodilator. DLCO of 25%. No lung volumes. Consistent with severe emphysema.  She has a history of bladder cancer, chronic kidney disease, GERD, invasive bladder cancer, and left  nephroureterectomy in  2002. For her last note with oncology in October 2016, her restaging CT was negative for recurrent or metastatic disease.  **CT chest 01/16/16: at [facility] and compared with previous from 07/11/15 Impression: 1. Several scattered reticular and nodular opacities are new from prior, favored to be infectious/inflammatory in etiology, although at least one nodule is solitary and could conceivably represent a neoplasm in the background setting of emphysema.  Recommend 3 month follow-up CT.  **CT chest in April 2016 Showed 0.7 cmm lung nodule in superior segment of Rt lower lobe  **CT chest images and report from 10/10/2015 and compared with previous from from 10/06/2015. There appeared to be a small right upper lobe nodule, as well as a small area of scarring/pneumonia which appeared to have resolved and the subsequent CT, although the nodule is still present. CT shows significant bilateral emphysema. There is also streaky atelectasis seen in the left base.   Medication:   Reviewed.     Allergies:  Patient has no known allergies.  Review of Systems: Gen:  Denies  fever, sweats, chills HEENT: Denies blurred vision, double vision.  Cvc:  No dizziness, chest pain. Resp:   Denies cough  Gi: Denies swallowing difficulty, stomach pain. Gu:  Denies bladder incontinence, burning urine Ext:   No Joint pain, stiffness. Skin: No skin rash,  hives Endoc:  No polyuria, polydipsia. Psych: No depression, insomnia. Other:  All other systems were reviewed with the patient and were negative other that what is mentioned in the HPI.   Physical Examination:   VS: BP 120/60 (BP Location: Left Arm, Cuff Size: Normal)   Pulse 77   Resp 16   Ht 5' 2.5" (1.588 m)   Wt 144 lb (65.3 kg)   SpO2 90%   BMI 25.92 kg/m  on room air. General Appearance: No distress  Neuro:without focal findings,  speech normal,  HEENT: PERRLA, EOM intact.   Pulmonary: normal breath sounds,  Decreased air entry bilaterally. No wheezing.  CardiovascularNormal S1,S2.  No m/r/g.   Abdomen: Benign, Soft, non-tender. Renal:  No costovertebral tenderness  GU:  No performed at this time. Endoc: No evident thyromegaly, no signs of acromegaly. Skin:   warm, no rashes, no ecchymosis  Extremities: normal, no cyanosis, clubbing.  Other findings:    LABORATORY PANEL:   CBC No results for input(s): WBC, HGB, HCT, PLT in the last 168  hours. ------------------------------------------------------------------------------------------------------------------  Chemistries  No results for input(s): NA, K, CL, CO2, GLUCOSE, BUN, CREATININE, CALCIUM, MG, AST, ALT, ALKPHOS, BILITOT in the last 168 hours.  Invalid input(s): GFRCGP ------------------------------------------------------------------------------------------------------------------  Cardiac Enzymes No results for input(s): TROPONINI in the last 168 hours. ------------------------------------------------------------  RADIOLOGY:  No results found.     Thank  you for the consultation and for allowing [facility] to assist in the care of your patient. Our recommendations are noted above.  Please contact us if we can be of further service.   Marda Stalker, MD.  Board Certified in Internal Medicine, Pulmonary Medicine, [place], and Sleep Medicine.  [facility]  Patricia Pesa, M.D.  Merton Border, M.D

## 2017-06-15 ENCOUNTER — Ambulatory Visit (INDEPENDENT_AMBULATORY_CARE_PROVIDER_SITE_OTHER): Payer: Medicare Other | Admitting: Internal Medicine

## 2017-06-15 ENCOUNTER — Encounter: Payer: Self-pay | Admitting: Internal Medicine

## 2017-06-15 VITALS — BP 120/60 | HR 77 | Resp 16 | Ht 62.5 in | Wt 144.0 lb

## 2017-06-15 DIAGNOSIS — R0609 Other forms of dyspnea: Secondary | ICD-10-CM

## 2017-06-15 DIAGNOSIS — J9621 Acute and chronic respiratory failure with hypoxia: Secondary | ICD-10-CM

## 2017-06-15 MED ORDER — AZITHROMYCIN 250 MG PO TABS: ORAL_TABLET | ORAL | 11 refills | Status: DC

## 2017-06-15 NOTE — Patient Instructions (Signed)
Continue to take azithromycin, 1 tablet every other day.   Call if you have a COPD flare up and we can prescribe prednisone.

## 2017-06-16 ENCOUNTER — Ambulatory Visit (INDEPENDENT_AMBULATORY_CARE_PROVIDER_SITE_OTHER): Payer: Medicare Other | Admitting: *Deleted

## 2017-06-16 ENCOUNTER — Other Ambulatory Visit: Payer: Self-pay | Admitting: *Deleted

## 2017-06-16 DIAGNOSIS — J9621 Acute and chronic respiratory failure with hypoxia: Secondary | ICD-10-CM

## 2017-06-16 NOTE — Progress Notes (Signed)
SIX MIN WALK 06/16/2017  Medications ASA 81/Azithromycin 250/Calcium/Advair 250-20 1 puff/Combivent 1 puff/Xopenex neb 0.63/Losartan 50 mg/Metoprolol 25 mg/MVI/Theo-24 300mg   Supplimental Oxygen during Test? (L/min) No  Laps 2  Partial Lap (in Meters) 0  Baseline BP (sitting) 128/72  Baseline Heartrate 83  Baseline Dyspnea (Borg Scale) 5  Baseline Fatigue (Borg Scale) 0  Baseline SPO2 92  BP (sitting) 150/80  Heartrate 51  Dyspnea (Borg Scale) 7  Fatigue (Borg Scale) 7  SPO2 63  BP (sitting) 138/78  Heartrate 77  SPO2 96  Stopped or Paused before Six Minutes Yes  Other Symptoms at end of Exercise Pt desated 4.5 minutes into walk down to 63% 2L of 02 put on patient came back up to 96%. Pt did not complain of any sx other than shortness of breath/fatigue.  Distance Completed 22    SMW performed today.

## 2017-06-16 NOTE — Patient Instructions (Signed)
Return visit already scheduled.

## 2017-10-08 ENCOUNTER — Ambulatory Visit: Payer: Medicare Other | Admitting: Podiatry

## 2017-10-12 ENCOUNTER — Encounter: Payer: Self-pay | Admitting: Podiatry

## 2017-10-12 ENCOUNTER — Ambulatory Visit (INDEPENDENT_AMBULATORY_CARE_PROVIDER_SITE_OTHER): Payer: Medicare Other | Admitting: Podiatry

## 2017-10-12 DIAGNOSIS — L603 Nail dystrophy: Secondary | ICD-10-CM | POA: Diagnosis not present

## 2017-10-16 NOTE — Progress Notes (Signed)
   Subjective: 82 year old female presenting to the office today for evaluation of pain to the right great toenail that has been present for more than one year. She reports associated thickening of the nail. She has been evaluated in the past for this same complaint. Applying pressure to the nail increases the pain. She denies alleviating factors. Patient is here for further evaluation and treatment.   Past Medical History:  Diagnosis Date  . Asthma   . Bladder cancer ([place]) 2015  . Cancer [facility]) 2001   kidney left   . COPD (chronic obstructive pulmonary disease) ([place])   . Hypertension   . Raynaud's disease   . Renal insufficiency     Objective: Physical Exam General: The patient is alert and oriented x3 in no acute distress.  Dermatology: Hyperkeratotic, discolored, thickened, onychodystrophy of the right great toenail.  Skin is warm, dry and supple bilateral lower extremities. Negative for open lesions or macerations.  Vascular: Palpable pedal pulses bilaterally. No edema or erythema noted. Capillary refill within normal limits.  Neurological: Epicritic and protective threshold grossly intact bilaterally.   Musculoskeletal Exam: Range of motion within normal limits to all pedal and ankle joints bilateral. Muscle strength 5/5 in all groups bilateral.   Assessment: #1 dystrophic right great toenail  Plan of Care:  #1 Patient was evaluated. #2 Mechanical debridement of the right great toenail performed using a nail nipper and smoothed with a rotary burr.  #3 Recommended good shoe gear. #4 Recommended Revitaderm lotion to soften nail. #5 Return to clinic as needed.    Edrick Kins, DPM [facility]  Dr. Edrick Kins, [address]                                        [place], [place] [address]                Office [phone]  Fax [phone]

## 2017-10-29 ENCOUNTER — Telehealth: Payer: Self-pay | Admitting: Internal Medicine

## 2017-10-29 NOTE — Telephone Encounter (Signed)
Message sent to [facility] asking as to the status of POC order placed on 06/16/17 if 1) [facility] made contact with patient and 2) status of order  Waiting on response. Rhonda J Cobb

## 2017-10-29 NOTE — Telephone Encounter (Signed)
Catherine Landry calling to check status of faxed order request

## 2017-10-29 NOTE — Telephone Encounter (Signed)
Last order placed for POC was 06/16/2017, the day of SMW. Please advise.

## 2017-11-01 NOTE — Telephone Encounter (Signed)
Pt states she is buying an [employer] straight out of pocket. Informed pt the form will be signed by DS when he returns next week and we will fax it once signed. Pt states that will be fine. Nothing further needed.     Paper waiting for signature in DS' folder.

## 2017-11-01 NOTE — Telephone Encounter (Signed)
There is still not a small POC provided by [facility]. I do not see an order for an [employer] POC.   Per [facility] "Met with pt for a OCD eval but pt wants a POC that is small enough to carry on her shoulder. I told her that we don't have one right now but that we were testing a new one. She will call periodically to check on availability."  [facility] still does not have a small POC that patient wants. Rhonda J Cobb

## 2017-11-01 NOTE — Telephone Encounter (Signed)
Nothing else needed at this time. Phone note closed. Rhonda J Cobb

## 2017-11-04 NOTE — Telephone Encounter (Signed)
[employer] form faxed back. Nothing further needed.

## 2017-11-22 ENCOUNTER — Inpatient Hospital Stay
Admission: EM | Admit: 2017-11-22 | Discharge: 2017-11-27 | DRG: 190 | Disposition: A | Discharge status: Home Health | Payer: Medicare Other | Attending: Specialist | Admitting: Specialist

## 2017-11-22 ENCOUNTER — Emergency Department: Payer: Medicare Other

## 2017-11-22 ENCOUNTER — Encounter: Payer: Self-pay | Admitting: Emergency Medicine

## 2017-11-22 ENCOUNTER — Other Ambulatory Visit: Payer: Self-pay

## 2017-11-22 DIAGNOSIS — J9621 Acute and chronic respiratory failure with hypoxia: Secondary | ICD-10-CM | POA: Diagnosis present

## 2017-11-22 DIAGNOSIS — Z87891 Personal history of nicotine dependence: Secondary | ICD-10-CM

## 2017-11-22 DIAGNOSIS — Z7982 Long term (current) use of aspirin: Secondary | ICD-10-CM

## 2017-11-22 DIAGNOSIS — J441 Chronic obstructive pulmonary disease with (acute) exacerbation: Secondary | ICD-10-CM | POA: Diagnosis not present

## 2017-11-22 DIAGNOSIS — Z8551 Personal history of malignant neoplasm of bladder: Secondary | ICD-10-CM

## 2017-11-22 DIAGNOSIS — Z9981 Dependence on supplemental oxygen: Secondary | ICD-10-CM

## 2017-11-22 DIAGNOSIS — I7300 Raynaud's syndrome without gangrene: Secondary | ICD-10-CM | POA: Diagnosis present

## 2017-11-22 DIAGNOSIS — J9601 Acute respiratory failure with hypoxia: Secondary | ICD-10-CM

## 2017-11-22 DIAGNOSIS — J189 Pneumonia, unspecified organism: Secondary | ICD-10-CM

## 2017-11-22 DIAGNOSIS — K219 Gastro-esophageal reflux disease without esophagitis: Secondary | ICD-10-CM | POA: Diagnosis present

## 2017-11-22 DIAGNOSIS — Z79899 Other long term (current) drug therapy: Secondary | ICD-10-CM

## 2017-11-22 DIAGNOSIS — Z7951 Long term (current) use of inhaled steroids: Secondary | ICD-10-CM

## 2017-11-22 DIAGNOSIS — N179 Acute kidney failure, unspecified: Secondary | ICD-10-CM | POA: Diagnosis present

## 2017-11-22 DIAGNOSIS — B370 Candidal stomatitis: Secondary | ICD-10-CM | POA: Diagnosis not present

## 2017-11-22 DIAGNOSIS — I10 Essential (primary) hypertension: Secondary | ICD-10-CM | POA: Diagnosis present

## 2017-11-22 DIAGNOSIS — Z906 Acquired absence of other parts of urinary tract: Secondary | ICD-10-CM

## 2017-11-22 LAB — COMPREHENSIVE METABOLIC PANEL
ALK PHOS: 44 U/L (ref 38–126)
ALT: 15 U/L (ref 14–54)
AST: 29 U/L (ref 15–41)
Albumin: 4.3 g/dL (ref 3.5–5.0)
Anion gap: 9 (ref 5–15)
BILIRUBIN TOTAL: 1.2 mg/dL (ref 0.3–1.2)
BUN: 18 mg/dL (ref 6–20)
CALCIUM: 9.2 mg/dL (ref 8.9–10.3)
CO2: 27 mmol/L (ref 22–32)
CREATININE: 1.04 mg/dL — AB (ref 0.44–1.00)
Chloride: 99 mmol/L — ABNORMAL LOW (ref 101–111)
GFR calc non Af Amer: 49 mL/min — ABNORMAL LOW (ref >60)
GFR, EST AFRICAN AMERICAN: 57 mL/min — AB (ref >60)
Glucose, Bld: 123 mg/dL — ABNORMAL HIGH (ref 65–99)
Potassium: 4.3 mmol/L (ref 3.5–5.1)
Sodium: 135 mmol/L (ref 135–145)
Total Protein: 7.4 g/dL (ref 6.5–8.1)

## 2017-11-22 LAB — BLOOD GAS, VENOUS
Acid-Base Excess: 4.6 mmol/L — ABNORMAL HIGH (ref 0.0–2.0)
Bicarbonate: 30.8 mmol/L — ABNORMAL HIGH (ref 20.0–28.0)
Delivery systems: POSITIVE
FIO2: 0.4
O2 SAT: 67.6 %
PATIENT TEMPERATURE: 37
PO2 VEN: 36 mmHg (ref 32.0–45.0)
pCO2, Ven: 52 mmHg (ref 44.0–60.0)
pH, Ven: 7.38 (ref 7.250–7.430)

## 2017-11-22 LAB — CBC WITH DIFFERENTIAL/PLATELET
BASOS PCT: 0 %
Basophils Absolute: 0 10*3/uL (ref 0–0.1)
EOS ABS: 0.1 10*3/uL (ref 0–0.7)
Eosinophils Relative: 1 %
HCT: 36.2 % (ref 35.0–47.0)
HEMOGLOBIN: 12.1 g/dL (ref 12.0–16.0)
Lymphocytes Relative: 10 %
Lymphs Abs: 1.4 10*3/uL (ref 1.0–3.6)
MCH: 32.4 pg (ref 26.0–34.0)
MCHC: 33.3 g/dL (ref 32.0–36.0)
MCV: 97.3 fL (ref 80.0–100.0)
MONOS PCT: 9 %
Monocytes Absolute: 1.1 10*3/uL — ABNORMAL HIGH (ref 0.2–0.9)
NEUTROS PCT: 80 %
Neutro Abs: 10.6 10*3/uL — ABNORMAL HIGH (ref 1.4–6.5)
Platelets: 177 10*3/uL (ref 150–440)
RBC: 3.72 MIL/uL — ABNORMAL LOW (ref 3.80–5.20)
RDW: 14.8 % — ABNORMAL HIGH (ref 11.5–14.5)
WBC: 13.3 10*3/uL — AB (ref 3.6–11.0)

## 2017-11-22 LAB — LACTIC ACID, PLASMA
LACTIC ACID, VENOUS: 1.3 mmol/L (ref 0.5–1.9)
LACTIC ACID, VENOUS: 1.2 mmol/L (ref 0.5–1.9)

## 2017-11-22 LAB — TROPONIN I: Troponin I: <0.03 ng/mL (ref <0.03)

## 2017-11-22 LAB — INFLUENZA PANEL BY PCR (TYPE A & B)
INFLBPCR: NEGATIVE
Influenza A By PCR: NEGATIVE

## 2017-11-22 MED ORDER — DOXYCYCLINE HYCLATE 100 MG PO TABS
100.0000 mg | ORAL_TABLET | Freq: Once | ORAL | Status: AC
  Administered 2017-11-22: 100 mg via ORAL
  Filled 2017-11-22: qty 1

## 2017-11-22 MED ORDER — IPRATROPIUM-ALBUTEROL 0.5-2.5 (3) MG/3ML IN SOLN
3.0000 mL | Freq: Once | RESPIRATORY_TRACT | Status: AC
  Administered 2017-11-22: 3 mL via RESPIRATORY_TRACT
  Filled 2017-11-22: qty 3

## 2017-11-22 MED ORDER — CEFTRIAXONE SODIUM 1 G IJ SOLR
1.0000 g | Freq: Once | INTRAMUSCULAR | Status: AC
  Administered 2017-11-22: 1 g via INTRAVENOUS
  Filled 2017-11-22: qty 10

## 2017-11-22 MED ORDER — SODIUM CHLORIDE 0.9 % IV BOLUS (SEPSIS)
1000.0000 mL | Freq: Once | INTRAVENOUS | Status: AC
  Administered 2017-11-22: 1000 mL via INTRAVENOUS

## 2017-11-22 MED ORDER — ACETAMINOPHEN 325 MG PO TABS
ORAL_TABLET | ORAL | Status: AC
  Filled 2017-11-22: qty 2

## 2017-11-22 MED ORDER — ONDANSETRON HCL 4 MG/2ML IJ SOLN
4.0000 mg | Freq: Once | INTRAMUSCULAR | Status: AC
  Administered 2017-11-22: 4 mg via INTRAVENOUS

## 2017-11-22 MED ORDER — ONDANSETRON HCL 4 MG/2ML IJ SOLN
INTRAMUSCULAR | Status: AC
  Filled 2017-11-22: qty 2

## 2017-11-22 MED ORDER — ACETAMINOPHEN 325 MG PO TABS
650.0000 mg | ORAL_TABLET | Freq: Once | ORAL | Status: AC
  Administered 2017-11-22: 650 mg via ORAL

## 2017-11-22 MED ORDER — ALBUTEROL SULFATE (2.5 MG/3ML) 0.083% IN NEBU
10.0000 mg | INHALATION_SOLUTION | Freq: Once | RESPIRATORY_TRACT | Status: AC
  Administered 2017-11-22: 10 mg via RESPIRATORY_TRACT
  Filled 2017-11-22: qty 12

## 2017-11-22 NOTE — ED Triage Notes (Signed)
Pt with sob, fever, cough and body aches x 3 days. Pt with hx copd and on 2 LNC at home. Pt sob, breath sounds diminished bilateral

## 2017-11-22 NOTE — ED Provider Notes (Signed)
[facility] Emergency Department Provider Note  ____________________________________________  Time seen: Approximately 7:01 PM  I have reviewed the triage vital signs and the nursing notes.   HISTORY  Chief Complaint Shortness of Breath   HPI Zareth Rippetoe is a 82 y.o. female with a history of bladder cancer in remission, COPD on 2 L nasal cannula, hypertension, asthma who presents for evaluation of shortness of breath. Patient reports that since yesterday she has had a dry cough and progressively worsening shortness of breath. The shortness of breath today became severe and constant. Patient used her albuterol inhaler 6 times with no significant improvement. She has had severe nausea associated with it but no vomiting or diarrhea, no chest pain, no dysuria, no abdominal pain. Patient with a fever since this morning. Patient was found to be hypoxic to the mid 80s per EMS on 4 L nasal cannula at home. She received 125 mg of Solu-Medrol and 2 breathing treatments in route.  Past Medical History:  Diagnosis Date  . Asthma   . Bladder cancer ([place]) 2015  . Cancer [facility]) 2001   kidney left   . COPD (chronic obstructive pulmonary disease) ([place])   . Hypertension   . Raynaud's disease   . Renal insufficiency     Patient Active Problem List   Diagnosis Date Noted  . UTI (lower urinary tract infection) 04/25/2016  . COPD exacerbation ([place]) 10/06/2015    Past Surgical History:  Procedure Laterality Date  . APPENDECTOMY    . BACK SURGERY    . CATARACT EXTRACTION    . COLONOSCOPY WITH PROPOFOL N/A 10/15/2016   Procedure: COLONOSCOPY WITH PROPOFOL;  Surgeon: Lollie Sails, MD;  Location: [facility] ENDOSCOPY;  Service: Endoscopy;  Laterality: N/A;  . ESOPHAGOGASTRODUODENOSCOPY (EGD) WITH PROPOFOL N/A 10/15/2016   Procedure: ESOPHAGOGASTRODUODENOSCOPY (EGD) WITH PROPOFOL;  Surgeon: Lollie Sails, MD;  Location: [facility] ENDOSCOPY;  Service: Endoscopy;   Laterality: N/A;  . NEPHRECTOMY      Prior to Admission medications   Medication Sig Start Date End Date Taking? Authorizing Provider  ADVAIR DISKUS 250-50 MCG/DOSE AEPB Inhale 1 puff into the lungs 2 (two) times daily. *Rinse mouth after use* 09/18/15   [provider]  aspirin EC 81 MG tablet Take 81 mg by mouth every morning.    [provider]  azithromycin (ZITHROMAX) 250 MG tablet Monday, Wednesday and Friday 12/14/16   Laverle Hobby, MD  azithromycin (ZITHROMAX) 250 MG tablet Take 1 tablet every other day. 06/15/17   Laverle Hobby, MD  Calcium-Magnesium-Vitamin D (CALCIUM 1200+D3 PO) Take 1 tablet by mouth every morning.    [provider]  COMBIVENT RESPIMAT 20-100 MCG/ACT AERS respimat Inhale 1 puff into the lungs 4 (four) times daily as needed. For COPD. 09/16/15   [provider]  levalbuterol Penne Lash) 0.63 MG/3ML nebulizer solution Take 3 mLs (0.63 mg total) by nebulization every 6 (six) hours as needed for wheezing or shortness of breath. DX: COPD DX Code: J44.9 11/18/16 11/18/17  Laverle Hobby, MD  losartan (COZAAR) 50 MG tablet Take 50 mg by mouth every morning.    [provider]  metoprolol succinate (TOPROL-XL) 25 MG 24 hr tablet Take 25 mg by mouth every morning.    [provider]  Multiple Vitamin (MULTIVITAMIN WITH MINERALS) TABS Take 1 tablet by mouth every morning.    [provider]  omeprazole (PRILOSEC) 40 MG capsule Take 40 mg by mouth as needed. 02/01/17   [provider]  polyethylene glycol powder (GLYCOLAX/MIRALAX) powder Take 17 g by mouth daily as needed for constipation. Mix with 4 to 8 ounces of fluid. 04/10/16   [provider]  theophylline (THEO-24) 300 MG 24 hr capsule Take 1 capsule (300 mg total) by mouth every morning. 10/12/15   Bettey Costa, MD    Allergies Patient has no known allergies.  Family History  Problem Relation Age of Onset  . Breast  cancer Mother 67    Social History Social History   Tobacco Use  . Smoking status: Former Smoker    Packs/day: 1.50    Last attempt to quit: 12/09/1999    Years since quitting: 17.9  . Smokeless tobacco: Never Used  Substance Use Topics  . Alcohol use: No  . Drug use: No    Review of Systems  Constitutional: + fever. Eyes: Negative for visual changes. ENT: Negative for sore throat. Neck: No neck pain  Cardiovascular: Negative for chest pain. Respiratory: + shortness of breath and cough Gastrointestinal: Negative for abdominal pain, vomiting or diarrhea. + nausea Genitourinary: Negative for dysuria. Musculoskeletal: Negative for back pain. Skin: Negative for rash. Neurological: Negative for headaches, weakness or numbness. Psych: No SI or HI  ____________________________________________   PHYSICAL EXAM:  VITAL SIGNS: Vitals:   11/22/17 1930 11/22/17 2000  BP: 127/69   Pulse: (!) 107 (!) 105  Resp: (!) 24 20  Temp:    SpO2: 98% 100%   Constitutional: Alert and oriented, moderate to severe respiratory distress.  HEENT:      Head: Normocephalic and atraumatic.         Eyes: Conjunctivae are normal. Sclera is non-icteric.       Mouth/Throat: Mucous membranes are moist.       Neck: Supple with no signs of meningismus. Cardiovascular: Regular rate and rhythm. No murmurs, gallops, or rubs. 2+ symmetrical distal pulses are present in all extremities. No JVD. Respiratory: patient is to, satting 98% on 5 L nasal cannula, severe increased work of breathing, speaking in 2 word sentences, severely diminished air movement with expiratory wheezes throughout Gastrointestinal: Soft, non tender, and non distended with positive bowel sounds. No rebound or guarding. Musculoskeletal: Nontender with normal range of motion in all extremities. No edema, cyanosis, or erythema of extremities. Neurologic: Normal speech and language. Face is symmetric. Moving all extremities. No gross focal  neurologic deficits are appreciated. Skin: Skin is warm, dry and intact. No rash noted. Psychiatric: Mood and affect are normal. Speech and behavior are normal.  ____________________________________________   LABS (all labs ordered are listed, but only abnormal results are displayed)  Labs Reviewed  COMPREHENSIVE METABOLIC PANEL - Abnormal; Notable for the following components:      Result Value   Chloride 99 (*)    Glucose, Bld 123 (*)    Creatinine, Ser 1.04 (*)    GFR calc non Af Amer 49 (*)    GFR calc Af Amer 57 (*)    All other components within normal limits  CBC WITH DIFFERENTIAL/PLATELET - Abnormal; Notable for the following components:   WBC 13.3 (*)    RBC 3.72 (*)    RDW 14.8 (*)    Neutro Abs 10.6 (*)    Monocytes Absolute 1.1 (*)    All other components within normal limits  BLOOD GAS, VENOUS - Abnormal; Notable for the following components:   Bicarbonate 30.8 (*)    Acid-Base Excess 4.6 (*)    All other components within normal limits  CULTURE, BLOOD (ROUTINE  X 2)  CULTURE, BLOOD (ROUTINE X 2)  TROPONIN I  INFLUENZA PANEL BY PCR (TYPE A & B)  URINALYSIS, ROUTINE W REFLEX MICROSCOPIC  LACTIC ACID, PLASMA  LACTIC ACID, PLASMA   ____________________________________________  EKG  ED ECG REPORT I, Rudene Re, the attending physician, personally viewed and interpreted this ECG.  Sinus tachycardia, rate of 117, incomplete left bundle branch block, normal QTC, left axis deviation, no ST elevations or depressions. changes are new compared to prior from 2014 ____________________________________________  RADIOLOGY  I have personally reviewed the images performed during this visit and I agree with the Radiologist's read.   Interpretation by Radiologist:  Dg Chest Port 1 View  Result Date: 11/22/2017 CLINICAL DATA:  Dyspnea, cough and body ache EXAM: PORTABLE CHEST 1 VIEW COMPARISON:  04/25/2016 FINDINGS: The heart size and mediastinal contours are  within normal limits. Moderate aortic atherosclerosis at the arch without aneurysm. Hyperinflated lungs compatible COPD with attenuated pulmonary vessels. The visualized skeletal structures are unremarkable. IMPRESSION: COPD.  No active pulmonary disease.  Aortic atherosclerosis. Electronically Signed   By: Ashley Royalty M.D.   On: 11/22/2017 19:25      ____________________________________________   PROCEDURES  Procedure(s) performed: None Procedures Critical Care performed: yes  CRITICAL CARE Performed by: Rudene Re  ?  Total critical care time: 40 min  Critical care time was exclusive of separately billable procedures and treating other patients.  Critical care was necessary to treat or prevent imminent or life-threatening deterioration.  Critical care was time spent personally by me on the following activities: development of treatment plan with patient and/or surrogate as well as nursing, discussions with consultants, evaluation of patient's response to treatment, examination of patient, obtaining history from patient or surrogate, ordering and performing treatments and interventions, ordering and review of laboratory studies, ordering and review of radiographic studies, pulse oximetry and re-evaluation of patient's condition.  ____________________________________________   INITIAL IMPRESSION / ASSESSMENT AND PLAN / ED COURSE  82 y.o. female with a history of bladder cancer in remission, COPD on 2 L nasal cannula, hypertension, asthma who presents for evaluation of cough, severe shortness of breath and fever. patient found to be hypoxic to the mid 80s on 4 L nasal cannula at home. Also had a fever of 101F per EMS. Patient received Solu-Medrol and 2 breathing treatments in route. Patient arrives in severe respiratory distress, speaking in two word sentences, tachypnea, with severely decreased air movement bilaterally and expiratory wheezes. Patient here with a temp of 100.53F  and tachycardic to 117. We'll do Flu swab, chest x-ray to rule out pneumonia, will start patient on BiPAP, we'll give 3 duo nebs, we'll give IV fluids. Labs are pending.    _________________________ 8:13 PM on 11/22/2017 -----------------------------------------  VBG with no evidence of retention with PCO2 of 52 and a pH of 7.38. flu negative. Baseline CMP. CBC showed a leukocytosis with white count of 13.3. Chest x-ray does not show pneumonia but clinically patient meets criteria for pneumonia with fever and a white count. We'll treat with Rocephin and doxycycline for community-acquired pneumonia. Patient's work of breathing has markedly improved on BiPAP. She continues to have wheezing and continues to have decreased air movement after 3-1 abs. We'll start patient on continuous albuterol. Will admit to the hospitalist for acute respiratory failure..   As part of my medical decision making, I reviewed the following data within the [place] notes reviewed and incorporated, Labs reviewed , EKG interpreted , Old EKG reviewed,  Radiograph reviewed , Discussed with admitting physician , Notes from prior ED visits and [place] Controlled Substance Database    Pertinent labs & imaging results that were available during my care of the patient were reviewed by me and considered in my medical decision making (see chart for details).    ____________________________________________   FINAL CLINICAL IMPRESSION(S) / ED DIAGNOSES  Final diagnoses:  Acute respiratory failure with hypoxia (HCC)  COPD exacerbation ([place])  Community acquired pneumonia, unspecified laterality      NEW MEDICATIONS STARTED DURING THIS VISIT:  ED Discharge Orders    None       Note:  This document was prepared using Dragon voice recognition software and may include unintentional dictation errors.    Alfred Levins, [place], MD 11/22/17 2015

## 2017-11-22 NOTE — ED Notes (Signed)
Pt removed from [facility] as ordered. Pt placed on 3L of O2 via nasal cannula. Pt given a soda and graham crackers as requested. Will monitor to see if pt can tolerate.

## 2017-11-23 ENCOUNTER — Other Ambulatory Visit: Payer: Self-pay

## 2017-11-23 DIAGNOSIS — J441 Chronic obstructive pulmonary disease with (acute) exacerbation: Secondary | ICD-10-CM | POA: Diagnosis present

## 2017-11-23 DIAGNOSIS — Z906 Acquired absence of other parts of urinary tract: Secondary | ICD-10-CM | POA: Diagnosis not present

## 2017-11-23 DIAGNOSIS — B370 Candidal stomatitis: Secondary | ICD-10-CM | POA: Diagnosis not present

## 2017-11-23 DIAGNOSIS — Z7982 Long term (current) use of aspirin: Secondary | ICD-10-CM | POA: Diagnosis not present

## 2017-11-23 DIAGNOSIS — J9621 Acute and chronic respiratory failure with hypoxia: Secondary | ICD-10-CM | POA: Diagnosis present

## 2017-11-23 DIAGNOSIS — Z87891 Personal history of nicotine dependence: Secondary | ICD-10-CM | POA: Diagnosis not present

## 2017-11-23 DIAGNOSIS — Z7951 Long term (current) use of inhaled steroids: Secondary | ICD-10-CM | POA: Diagnosis not present

## 2017-11-23 DIAGNOSIS — Z79899 Other long term (current) drug therapy: Secondary | ICD-10-CM | POA: Diagnosis not present

## 2017-11-23 DIAGNOSIS — Z8551 Personal history of malignant neoplasm of bladder: Secondary | ICD-10-CM | POA: Diagnosis not present

## 2017-11-23 DIAGNOSIS — I10 Essential (primary) hypertension: Secondary | ICD-10-CM | POA: Diagnosis present

## 2017-11-23 DIAGNOSIS — N179 Acute kidney failure, unspecified: Secondary | ICD-10-CM | POA: Diagnosis present

## 2017-11-23 DIAGNOSIS — Z9981 Dependence on supplemental oxygen: Secondary | ICD-10-CM | POA: Diagnosis not present

## 2017-11-23 DIAGNOSIS — I7300 Raynaud's syndrome without gangrene: Secondary | ICD-10-CM | POA: Diagnosis present

## 2017-11-23 DIAGNOSIS — K219 Gastro-esophageal reflux disease without esophagitis: Secondary | ICD-10-CM | POA: Diagnosis present

## 2017-11-23 LAB — TSH: TSH: 1.609 u[IU]/mL (ref 0.350–4.500)

## 2017-11-23 MED ORDER — PREDNISONE 50 MG PO TABS
50.0000 mg | ORAL_TABLET | Freq: Every day | ORAL | Status: AC
  Administered 2017-11-23: 50 mg via ORAL
  Filled 2017-11-23: qty 1

## 2017-11-23 MED ORDER — IPRATROPIUM-ALBUTEROL 0.5-2.5 (3) MG/3ML IN SOLN: 3.0000 mL | Freq: Four times a day (QID) | RESPIRATORY_TRACT | Status: DC | PRN

## 2017-11-23 MED ORDER — PREDNISONE 10 MG PO TABS
5.0000 mg | ORAL_TABLET | Freq: Every day | ORAL | Status: DC
Start: 2017-11-28 — End: 2017-11-25

## 2017-11-23 MED ORDER — ACETAMINOPHEN 325 MG PO TABS
650.0000 mg | ORAL_TABLET | Freq: Four times a day (QID) | ORAL | Status: DC | PRN
  Administered 2017-11-23 – 2017-11-27 (×6): 650 mg via ORAL
  Filled 2017-11-23 (×4): qty 2

## 2017-11-23 MED ORDER — POLYETHYLENE GLYCOL 3350 17 G PO PACK
17.0000 g | PACK | Freq: Every day | ORAL | Status: DC | PRN
  Administered 2017-11-24 – 2017-11-25 (×2): 17 g via ORAL
  Filled 2017-11-23 (×2): qty 1

## 2017-11-23 MED ORDER — ONDANSETRON HCL 4 MG/2ML IJ SOLN: 4.0000 mg | Freq: Four times a day (QID) | INTRAMUSCULAR | Status: DC | PRN

## 2017-11-23 MED ORDER — ENSURE ENLIVE PO LIQD
237.0000 mL | Freq: Two times a day (BID) | ORAL | Status: DC
  Administered 2017-11-24 – 2017-11-25 (×2): 237 mL via ORAL

## 2017-11-23 MED ORDER — PREDNISONE 20 MG PO TABS
40.0000 mg | ORAL_TABLET | Freq: Every day | ORAL | Status: AC
  Administered 2017-11-24: 40 mg via ORAL
  Filled 2017-11-23: qty 2

## 2017-11-23 MED ORDER — METOPROLOL SUCCINATE ER 25 MG PO TB24
25.0000 mg | ORAL_TABLET | Freq: Every morning | ORAL | Status: DC
Start: 2017-11-23 — End: 2017-11-27
  Administered 2017-11-23 – 2017-11-27 (×5): 25 mg via ORAL
  Filled 2017-11-23 (×5): qty 1

## 2017-11-23 MED ORDER — AZITHROMYCIN 250 MG PO TABS
250.0000 mg | ORAL_TABLET | Freq: Every day | ORAL | Status: DC
  Administered 2017-11-24 – 2017-11-27 (×4): 250 mg via ORAL
  Filled 2017-11-23 (×4): qty 1

## 2017-11-23 MED ORDER — POLYETHYLENE GLYCOL 3350 17 GM/SCOOP PO POWD: 17.0000 g | Freq: Every day | ORAL | Status: DC | PRN

## 2017-11-23 MED ORDER — SUCRALFATE 1 G PO TABS
1.0000 g | ORAL_TABLET | Freq: Two times a day (BID) | ORAL | Status: DC
  Administered 2017-11-23 – 2017-11-27 (×9): 1 g via ORAL
  Filled 2017-11-23 (×9): qty 1

## 2017-11-23 MED ORDER — PREDNISONE 20 MG PO TABS: 20.0000 mg | ORAL_TABLET | Freq: Every day | ORAL | Status: DC

## 2017-11-23 MED ORDER — ASPIRIN EC 81 MG PO TBEC
81.0000 mg | DELAYED_RELEASE_TABLET | Freq: Every morning | ORAL | Status: DC
  Administered 2017-11-23 – 2017-11-27 (×5): 81 mg via ORAL
  Filled 2017-11-23 (×5): qty 1

## 2017-11-23 MED ORDER — PREDNISONE 20 MG PO TABS
30.0000 mg | ORAL_TABLET | Freq: Every day | ORAL | Status: AC
  Administered 2017-11-25: 30 mg via ORAL
  Filled 2017-11-23: qty 1

## 2017-11-23 MED ORDER — PANTOPRAZOLE SODIUM 40 MG PO TBEC
80.0000 mg | DELAYED_RELEASE_TABLET | Freq: Every day | ORAL | Status: DC
Start: 2017-11-23 — End: 2017-11-27
  Administered 2017-11-23 – 2017-11-27 (×5): 80 mg via ORAL
  Filled 2017-11-23 (×5): qty 2

## 2017-11-23 MED ORDER — LEVALBUTEROL HCL 0.63 MG/3ML IN NEBU
0.6300 mg | INHALATION_SOLUTION | Freq: Four times a day (QID) | RESPIRATORY_TRACT | Status: DC | PRN
Start: 2017-11-23 — End: 2017-11-23

## 2017-11-23 MED ORDER — PREDNISONE 10 MG PO TABS: 10.0000 mg | ORAL_TABLET | Freq: Every day | ORAL | Status: DC

## 2017-11-23 MED ORDER — ONDANSETRON HCL 4 MG PO TABS: 4.0000 mg | ORAL_TABLET | Freq: Four times a day (QID) | ORAL | Status: DC | PRN

## 2017-11-23 MED ORDER — SODIUM CHLORIDE 0.9% FLUSH
3.0000 mL | Freq: Two times a day (BID) | INTRAVENOUS | Status: DC
  Administered 2017-11-23 – 2017-11-27 (×8): 3 mL via INTRAVENOUS

## 2017-11-23 MED ORDER — LOSARTAN POTASSIUM 50 MG PO TABS
50.0000 mg | ORAL_TABLET | Freq: Every morning | ORAL | Status: DC
  Administered 2017-11-23 – 2017-11-27 (×5): 50 mg via ORAL
  Filled 2017-11-23 (×5): qty 1

## 2017-11-23 MED ORDER — MOMETASONE FURO-FORMOTEROL FUM 200-5 MCG/ACT IN AERO
2.0000 | INHALATION_SPRAY | Freq: Two times a day (BID) | RESPIRATORY_TRACT | Status: DC
  Administered 2017-11-23 – 2017-11-27 (×9): 2 via RESPIRATORY_TRACT
  Filled 2017-11-23 (×2): qty 8.8

## 2017-11-23 MED ORDER — THEOPHYLLINE ER 300 MG PO CP24
300.0000 mg | ORAL_CAPSULE | Freq: Every morning | ORAL | Status: DC
  Administered 2017-11-23 – 2017-11-26 (×4): 300 mg via ORAL
  Filled 2017-11-23 (×5): qty 1

## 2017-11-23 MED ORDER — DOCUSATE SODIUM 100 MG PO CAPS
100.0000 mg | ORAL_CAPSULE | Freq: Two times a day (BID) | ORAL | Status: DC
  Administered 2017-11-23 – 2017-11-27 (×9): 100 mg via ORAL
  Filled 2017-11-23 (×9): qty 1

## 2017-11-23 MED ORDER — ENOXAPARIN SODIUM 40 MG/0.4ML ~~LOC~~ SOLN
40.0000 mg | SUBCUTANEOUS | Status: DC
  Administered 2017-11-23 – 2017-11-26 (×4): 40 mg via SUBCUTANEOUS
  Filled 2017-11-23 (×4): qty 0.4

## 2017-11-23 MED ORDER — ADULT MULTIVITAMIN W/MINERALS CH
1.0000 | ORAL_TABLET | Freq: Every morning | ORAL | Status: DC
  Administered 2017-11-23 – 2017-11-27 (×5): 1 via ORAL
  Filled 2017-11-23 (×5): qty 1

## 2017-11-23 MED ORDER — IPRATROPIUM-ALBUTEROL 20-100 MCG/ACT IN AERS: 1.0000 | INHALATION_SPRAY | Freq: Four times a day (QID) | RESPIRATORY_TRACT | Status: DC | PRN

## 2017-11-23 MED ORDER — ACETAMINOPHEN 650 MG RE SUPP: 650.0000 mg | Freq: Four times a day (QID) | RECTAL | Status: DC | PRN

## 2017-11-23 NOTE — H&P (Signed)
Catherine Landry is an 82 y.o. female.   Chief Complaint: Shortness of breath HPI: Patient with past medical history of COPD on home oxygen therapy 2 L/min via nasal cannula presents emergency department complaining of worsening dyspnea.  The patient states that she has been feeling unwell all week with worsening cough but has not been able to walk more than a few feet without severe fatigue and shortness of breath.  The patient required BiPAP for oxygenation and work of breathing.  She remained on noninvasive positive pressure ventilation for 4 hours before she was weaned to oxygen via nasal cannula.  Once she was stabilized the emergency department staff called the hospitalist service for further management.  Past Medical History:  Diagnosis Date  . Asthma   . Bladder cancer ([place]) 2015  . Cancer [facility]) 2001   kidney left   . COPD (chronic obstructive pulmonary disease) ([place])   . Hypertension   . Raynaud's disease   . Renal insufficiency     Past Surgical History:  Procedure Laterality Date  . APPENDECTOMY    . BACK SURGERY    . CATARACT EXTRACTION    . COLONOSCOPY WITH PROPOFOL N/A 10/15/2016   Procedure: COLONOSCOPY WITH PROPOFOL;  Surgeon: Lollie Sails, MD;  Location: [facility] ENDOSCOPY;  Service: Endoscopy;  Laterality: N/A;  . ESOPHAGOGASTRODUODENOSCOPY (EGD) WITH PROPOFOL N/A 10/15/2016   Procedure: ESOPHAGOGASTRODUODENOSCOPY (EGD) WITH PROPOFOL;  Surgeon: Lollie Sails, MD;  Location: [facility] ENDOSCOPY;  Service: Endoscopy;  Laterality: N/A;  . NEPHRECTOMY      Family History  Problem Relation Age of Onset  . Breast cancer Mother 55   Social History:  reports that she quit smoking about 17 years ago. She smoked 1.50 packs per day. she has never used smokeless tobacco. She reports that she drinks alcohol. She reports that she does not use drugs.  Allergies: No Known Allergies  Medications Prior to Admission  Medication Sig Dispense Refill  . ADVAIR DISKUS 250-50 MCG/DOSE  AEPB Inhale 1 puff into the lungs 2 (two) times daily. *Rinse mouth after use*  1  . aspirin EC 81 MG tablet Take 81 mg by mouth every morning.    . Calcium-Magnesium-Vitamin D (CALCIUM 1200+D3 PO) Take 1 tablet by mouth every morning.    Marland Kitchen losartan (COZAAR) 50 MG tablet Take 50 mg by mouth every morning.    . metoprolol succinate (TOPROL-XL) 25 MG 24 hr tablet Take 25 mg by mouth every morning.    . Multiple Vitamin (MULTIVITAMIN WITH MINERALS) TABS Take 1 tablet by mouth every morning.    Marland Kitchen omeprazole (PRILOSEC) 40 MG capsule Take 40 mg by mouth as needed.    . polyethylene glycol powder (GLYCOLAX/MIRALAX) powder Take 17 g by mouth daily as needed for constipation. Mix with 4 to 8 ounces of fluid.    Marland Kitchen sucralfate (CARAFATE) 1 g tablet Take 1 g by mouth 2 (two) times daily before a meal.  3  . theophylline (THEO-24) 300 MG 24 hr capsule Take 1 capsule (300 mg total) by mouth every morning. 30 capsule 0  . azithromycin (ZITHROMAX) 250 MG tablet Monday, Wednesday and Friday (Patient not taking: Reported on 11/22/2017) 30 tablet 1  . azithromycin (ZITHROMAX) 250 MG tablet Take 1 tablet every other day. (Patient not taking: Reported on 11/22/2017) 15 each 11  . COMBIVENT RESPIMAT 20-100 MCG/ACT AERS respimat Inhale 1 puff into the lungs 4 (four) times daily as needed. For COPD.  5  . levalbuterol (XOPENEX) 0.63 MG/3ML  nebulizer solution Take 3 mLs (0.63 mg total) by nebulization every 6 (six) hours as needed for wheezing or shortness of breath. DX: COPD DX Code: J44.9 120 mL 5    Results for orders placed or performed during the hospital encounter of 11/22/17 (from the past 48 hour(s))  Comprehensive metabolic panel     Status: Abnormal   Collection Time: 11/22/17  6:59 PM  Result Value Ref Range   Sodium 135 135 - 145 mmol/L   Potassium 4.3 3.5 - 5.1 mmol/L   Chloride 99 (L) 101 - 111 mmol/L   CO2 27 22 - 32 mmol/L   Glucose, Bld 123 (H) 65 - 99 mg/dL   BUN 18 6 - 20 mg/dL   Creatinine, Ser 1.04  (H) 0.44 - 1.00 mg/dL   Calcium 9.2 8.9 - 10.3 mg/dL   Total Protein 7.4 6.5 - 8.1 g/dL   Albumin 4.3 3.5 - 5.0 g/dL   AST 29 15 - 41 U/L   ALT 15 14 - 54 U/L   Alkaline Phosphatase 44 38 - 126 U/L   Total Bilirubin 1.2 0.3 - 1.2 mg/dL   GFR calc non Af Amer 49 (L) >60 mL/min   GFR calc Af Amer 57 (L) >60 mL/min    Comment: (NOTE) The eGFR has been calculated using the CKD EPI equation. This calculation has not been validated in all clinical situations. eGFR's persistently <60 mL/min signify possible Chronic Kidney Disease.    Anion gap 9 5 - 15    Comment: Performed at [facility], [address] [place], [place] [address]  CBC WITH DIFFERENTIAL     Status: Abnormal   Collection Time: 11/22/17  6:59 PM  Result Value Ref Range   WBC 13.3 (H) 3.6 - 11.0 K/uL   RBC 3.72 (L) 3.80 - 5.20 MIL/uL   Hemoglobin 12.1 12.0 - 16.0 g/dL   HCT 36.2 35.0 - 47.0 %   MCV 97.3 80.0 - 100.0 fL   MCH 32.4 26.0 - 34.0 pg   MCHC 33.3 32.0 - 36.0 g/dL   RDW 14.8 (H) 11.5 - 14.5 %   Platelets 177 150 - 440 K/uL   Neutrophils Relative % 80 %   Neutro Abs 10.6 (H) 1.4 - 6.5 K/uL   Lymphocytes Relative 10 %   Lymphs Abs 1.4 1.0 - 3.6 K/uL   Monocytes Relative 9 %   Monocytes Absolute 1.1 (H) 0.2 - 0.9 K/uL   Eosinophils Relative 1 %   Eosinophils Absolute 0.1 0 - 0.7 K/uL   Basophils Relative 0 %   Basophils Absolute 0.0 0 - 0.1 K/uL    Comment: Performed at [facility], [address] [place], [place] [address]  Troponin I     Status: None   Collection Time: 11/22/17  6:59 PM  Result Value Ref Range   Troponin I <0.03 <0.03 ng/mL    Comment: Performed at [facility], [address] [place], [place] [address]  TSH     Status: None   Collection Time: 11/22/17  6:59 PM  Result Value Ref Range   TSH 1.609 0.350 - 4.500 uIU/mL    Comment: Performed by a 3rd Generation assay with a functional sensitivity of <=0.01 uIU/mL. Performed at [facility],  [address] [place], [place] [address]   Lactic acid, plasma     Status: None   Collection Time: 11/22/17  7:00 PM  Result Value Ref Range   Lactic Acid, Venous 1.3  0.5 - 1.9 mmol/L    Comment: Performed at [facility], [address] [place], [place] [address]  Blood gas, venous     Status: Abnormal   Collection Time: 11/22/17  7:00 PM  Result Value Ref Range   FIO2 0.40    Delivery systems BILEVEL POSITIVE AIRWAY PRESSURE    pH, Ven 7.38 7.250 - 7.430   pCO2, Ven 52 44.0 - 60.0 mmHg   pO2, Ven 36.0 32.0 - 45.0 mmHg   Bicarbonate 30.8 (H) 20.0 - 28.0 mmol/L   Acid-Base Excess 4.6 (H) 0.0 - 2.0 mmol/L   O2 Saturation 67.6 %   Patient temperature 37.0    Collection site VENOUS    Sample type VENOUS     Comment: Performed at [facility], [address] [place], [place] [address]  Influenza panel by PCR (type A & B)     Status: None   Collection Time: 11/22/17  7:00 PM  Result Value Ref Range   Influenza A By PCR NEGATIVE NEGATIVE   Influenza B By PCR NEGATIVE NEGATIVE    Comment: (NOTE) The Xpert Xpress Flu assay is intended as an aid in the diagnosis of  influenza and should not be used as a sole basis for treatment.  This  assay is FDA approved for nasopharyngeal swab specimens only. Nasal  washings and aspirates are unacceptable for Xpert Xpress Flu testing. Performed at [facility], [address] [place], [place] [address]   Lactic acid, plasma     Status: None   Collection Time: 11/22/17  9:13 PM  Result Value Ref Range   Lactic Acid, Venous 1.2 0.5 - 1.9 mmol/L    Comment: Performed at [facility], [address] [place], [place] [address]   Dg Chest Port 1 View  Result Date: 11/22/2017 CLINICAL DATA:  Dyspnea, cough and body ache EXAM: PORTABLE CHEST 1 VIEW COMPARISON:  04/25/2016 FINDINGS: The heart size and mediastinal contours are within normal limits. Moderate aortic atherosclerosis at the arch without aneurysm.  Hyperinflated lungs compatible COPD with attenuated pulmonary vessels. The visualized skeletal structures are unremarkable. IMPRESSION: COPD.  No active pulmonary disease.  Aortic atherosclerosis. Electronically Signed   By: Ashley Royalty M.D.   On: 11/22/2017 19:25    Review of Systems  Constitutional: Negative for chills and fever.  HENT: Negative for sore throat and tinnitus.   Eyes: Negative for blurred vision and redness.  Respiratory: Positive for cough and shortness of breath.   Cardiovascular: Negative for chest pain, palpitations, orthopnea and PND.  Gastrointestinal: Negative for abdominal pain, diarrhea, nausea and vomiting.  Genitourinary: Negative for dysuria, frequency and urgency.  Musculoskeletal: Negative for joint pain and myalgias.  Skin: Negative for rash.       No lesions  Neurological: Negative for speech change, focal weakness and weakness.  Endo/Heme/Allergies: Does not bruise/bleed easily.       No temperature intolerance  Psychiatric/Behavioral: Negative for depression and suicidal ideas.    Blood pressure (!) 118/57, pulse (!) 107, temperature 97.9 F (36.6 C), temperature source Oral, resp. rate 19, height 5' 3"  (1.6 m), weight 65 kg (143 lb 4.8 oz), SpO2 93 %. Physical Exam  Vitals reviewed. Constitutional: She is oriented to person, place, and time. She appears well-developed and well-nourished. No distress.  HENT:  Head: Normocephalic and atraumatic.  Mouth/Throat: Oropharynx is clear and moist.  Eyes: Conjunctivae and EOM are normal. Pupils are equal, round, and reactive to light. No scleral icterus.  Neck: Normal range of motion. Neck supple. No JVD present. No tracheal deviation present. No thyromegaly present.  Cardiovascular: Normal rate, regular rhythm and normal heart sounds. Exam reveals no gallop and no friction rub.  No murmur heard. Respiratory: Effort normal and breath sounds normal.  GI: Soft. Bowel sounds are normal. She exhibits no  distension. There is no tenderness.  Genitourinary:  Genitourinary Comments: Deferred  Musculoskeletal: Normal range of motion. She exhibits no edema.  Lymphadenopathy:    She has no cervical adenopathy.  Neurological: She is alert and oriented to person, place, and time. No cranial nerve deficit. She exhibits normal muscle tone.  Skin: Skin is warm and dry. No rash noted. No erythema.  Psychiatric: She has a normal mood and affect. Her behavior is normal. Judgment and thought content normal.     Assessment/Plan This is an 82 year old female admitted for acute on chronic respiratory failure. 1.  Respiratory failure: Acute on chronic; with hypoxia.  The patient has an increased oxygen requirement and cough with increased sputum.  Patient is received ceftriaxone and doxycycline in the emergency department.  We may continue with macrolide therapy for anti-inflammatory effect.  Wean oxygen as tolerated back to baseline  2.  COPD: With exacerbation; continue inhaled corticosteroid.  Albuterol as needed.  Add steroid taper. 3.  Hypertension: Controlled; continue Cozaar and metoprolol 4.  Acute kidney injury: Mild; avoid nephrotoxic agents. 5.  DVT prophylaxis: Lovenox 6.  GI prophylaxis: Pantoprazole per home regimen The patient is a full code.  Time spent on admission orders and patient care proximal 45 minutes  Harrie Foreman, MD 11/23/2017, 7:12 AM

## 2017-11-23 NOTE — Progress Notes (Signed)
admitted morning for COPD exacerbation, continue bronchodilators, inhaled steroids, IV antibiotics.  Medications, labs reviewed/ Time spent 15 minutes

## 2017-11-23 NOTE — ED Notes (Signed)
Pt

## 2017-11-23 NOTE — Plan of Care (Signed)
  Progressing Education: Knowledge of General Education information will improve 11/23/2017 0308 - Progressing by Loran Senters, RN Safety: Ability to remain free from injury will improve 11/23/2017 0308 - Progressing by Loran Senters, RN Respiratory: Ability to maintain a clear airway will improve 11/23/2017 0308 - Progressing by Loran Senters, RN Levels of oxygenation will improve 11/23/2017 0308 - Progressing by Loran Senters, RN

## 2017-11-23 NOTE — ED Notes (Signed)
Pt up to bedside commode for a bowel movement.

## 2017-11-23 NOTE — Progress Notes (Addendum)
Initial Nutrition Assessment  DOCUMENTATION CODES:   Not applicable  INTERVENTION:   Ensure Enlive po BID, each supplement provides 350 kcal and 20 grams of protein  Magic cup TID with meals, each supplement provides 290 kcal and 9 grams of protein  MVI daily  Liberalize diet  NUTRITION DIAGNOSIS:   Increased nutrient needs related to cancer and cancer related treatments, other (see comment)(COPD) as evidenced by increased estimated needs from protein .  GOAL:   Patient will meet greater than or equal to 90% of their needs  MONITOR:   PO intake, Supplement acceptance, Labs, Weight trends, I & O's  REASON FOR ASSESSMENT:   Malnutrition Screening Tool    ASSESSMENT:   82 y.o. female with a history of bladder cancer in remission, COPD on 2 L nasal cannula, hypertension, asthma who presents for evaluation of shortness of breath.   Met with pt in room today. Pt reports intermittent poor appetite and oral intake pta. Pt reports that sometimes she will eat better than others. Pt reports eating small portions of foods. Pt does drink Boost 2-3 times a week at home. Pt is willing to drink Ensure while hospitalized. Pt ate a muffin and some breakfast potatoes for breakfast today. Per chart, pt is weight stable. RD discussed the importance of protein with pt. Pt noted to have dry, dull, lackluster of hair with flag sign. Pt reports that her hair is falling out in clumps. RD suspects pt with protein calorie malnutrition but pt does not meet criteria at this time. RD will add supplements and liberalize diet.    Medications reviewed and include: aspirin, azithromycin, colace, lovenox, MVI, protonix, prednisone, carafate  Labs reviewed:  Wbc- 13.3(H)- 3/4  Nutrition-Focused physical exam completed. Findings are mild fat depletions in orbital and buccal regions, mild muscle depletions in hands, and no edema. Pt noted to have dry, dull, lackluster of hair with flag sign. Pt reports that her  hair is falling out in clumps.    Diet Order:  Diet regular Room service appropriate? Yes; Fluid consistency: Thin  EDUCATION NEEDS:   Education needs have been addressed  Skin:  Reviewed RN Assessment  Last BM:  3/4  Height:   Ht Readings from Last 1 Encounters:  11/23/17 5' 3"  (1.6 m)    Weight:   Wt Readings from Last 1 Encounters:  11/23/17 143 lb 4.8 oz (65 kg)    Ideal Body Weight:  52 kg  BMI:  Body mass index is 25.38 kg/m.  Estimated Nutritional Needs:   Kcal:  1400-1600kcal/day   Protein:  78-91g/day   Fluid:  >1.6L/day   Koleen Distance MS, RD, LDN Pager #[phone] After Hours Pager: [phone]

## 2017-11-23 NOTE — Plan of Care (Signed)
Patient is short of breath at rest. Pulse ox is improving on 3 liters of oxygen.

## 2017-11-24 LAB — URINALYSIS, ROUTINE W REFLEX MICROSCOPIC
Bilirubin Urine: NEGATIVE
Glucose, UA: NEGATIVE mg/dL
Hgb urine dipstick: NEGATIVE
Ketones, ur: NEGATIVE mg/dL
Leukocytes, UA: NEGATIVE
Nitrite: NEGATIVE
Protein, ur: NEGATIVE mg/dL
Specific Gravity, Urine: 1.005 (ref 1.005–1.030)
pH: 6 (ref 5.0–8.0)

## 2017-11-24 MED ORDER — GUAIFENESIN-DM 100-10 MG/5ML PO SYRP
5.0000 mL | ORAL_SOLUTION | ORAL | Status: DC | PRN
  Administered 2017-11-24 – 2017-11-25 (×3): 5 mL via ORAL
  Filled 2017-11-24 (×3): qty 5

## 2017-11-24 MED ORDER — LORATADINE 10 MG PO TABS
10.0000 mg | ORAL_TABLET | Freq: Every day | ORAL | Status: DC
  Administered 2017-11-24 – 2017-11-27 (×4): 10 mg via ORAL
  Filled 2017-11-24 (×5): qty 1

## 2017-11-24 MED ORDER — IPRATROPIUM-ALBUTEROL 0.5-2.5 (3) MG/3ML IN SOLN
3.0000 mL | Freq: Four times a day (QID) | RESPIRATORY_TRACT | Status: DC
  Administered 2017-11-24 – 2017-11-27 (×11): 3 mL via RESPIRATORY_TRACT
  Filled 2017-11-24 (×12): qty 3

## 2017-11-24 NOTE — Care Management (Addendum)
Patient from home where she lives with her husband.  Family members are concern that she and her husband should be considering an assisted living type of environment.  Patient and her husband feel that are not at the point of needing such assistance.  Chronic 02 with [facility]. Discussed returning home with home health services including RN PT OT Aide and SW. Agency preference would be [facility].  Discussed the COPD Home Protocol Discussed supported living and retirement communities such as [facility] and [facility], assisted living facilities, and finally agencies that provide in home continuous care assistance. Provided with contact information for private duty agencies and home health agencies. Family is familiar with [name] and [facility].  Patient does the driving to appointments for her husband and family feels she got run down taking care of him.  She must travel to [facility] periodically for follow up of bladder cancer.  She would like to return home with home health and feels that she could not go to a facility if recommended because there would be no one to take care of her husband. Heads up referral to [facility]

## 2017-11-24 NOTE — Progress Notes (Signed)
Patient ID: Catherine Landry, female   DOB: 22-Jul-1936, 81 y.o.   MRN: [id number]  [employer] PROGRESS NOTE  Catherine Landry HYW:[id number] DOB: 1935/11/02 DOA: 11/22/2017 PCP: Catherine Harrier, MD  HPI/Subjective: Patient still does not feel that well.  Some cough and shortness of breath.  Feels like her breathing is not back to normal.  Complains of headache after the flu swab  Objective: Vitals:   11/24/17 0353 11/24/17 0805  BP: 138/63 139/67  Pulse: 73 80  Resp: 19 18  Temp: 98 F (36.7 C)   SpO2: 97% 98%    Filed Weights   11/22/17 1924 11/23/17 0227 11/24/17 0353  Weight: 68.5 kg (151 lb) 65 kg (143 lb 4.8 oz) 64.6 kg (142 lb 8 oz)    ROS: Review of Systems  Constitutional: Negative for chills and fever.  Eyes: Negative for blurred vision.  Respiratory: Positive for cough, shortness of breath and wheezing.   Cardiovascular: Negative for chest pain.  Gastrointestinal: Negative for abdominal pain, constipation, diarrhea, nausea and vomiting.  Genitourinary: Negative for dysuria.  Musculoskeletal: Negative for joint pain.  Neurological: Negative for dizziness and headaches.   Exam: Physical Exam  HENT:  Nose: No mucosal edema.  Mouth/Throat: No oropharyngeal exudate or posterior oropharyngeal edema.  Eyes: Conjunctivae, EOM and lids are normal. Pupils are equal, round, and reactive to light.  Neck: No JVD present. Carotid bruit is not present. No edema present. No thyroid mass and no thyromegaly present.  Cardiovascular: S1 normal and S2 normal. Exam reveals no gallop.  No murmur heard. Pulses:      Dorsalis pedis pulses are 2+ on the right side, and 2+ on the left side.  Respiratory: No respiratory distress. She has decreased breath sounds in the right middle field, the right lower field, the left middle field and the left lower field. She has wheezes in the right middle field, the right lower field, the left middle field and the left lower field. She has  no rhonchi. She has no rales.  GI: Soft. Bowel sounds are normal. There is no tenderness.  Musculoskeletal:       Right shoulder: She exhibits no swelling.  Lymphadenopathy:    She has no cervical adenopathy.  Neurological: She is alert. No cranial nerve deficit.  Skin: Skin is warm. No rash noted. Nails show no clubbing.  Psychiatric: She has a normal mood and affect.      Data Reviewed: Basic Metabolic Panel: Recent Labs  Lab 11/22/17 1859  NA 135  K 4.3  CL 99*  CO2 27  GLUCOSE 123*  BUN 18  CREATININE 1.04*  CALCIUM 9.2   Liver Function Tests: Recent Labs  Lab 11/22/17 1859  AST 29  ALT 15  ALKPHOS 44  BILITOT 1.2  PROT 7.4  ALBUMIN 4.3   CBC: Recent Labs  Lab 11/22/17 1859  WBC 13.3*  NEUTROABS 10.6*  HGB 12.1  HCT 36.2  MCV 97.3  PLT 177   Cardiac Enzymes: Recent Labs  Lab 11/22/17 1859  TROPONINI <0.03     Recent Results (from the past 240 hour(s))  Blood Culture (routine x 2)     Status: None (Preliminary result)   Collection Time: 11/22/17  6:59 PM  Result Value Ref Range Status   Specimen Description BLOOD LEFT WRIST  Final   Special Requests   Final    BOTTLES DRAWN AEROBIC AND ANAEROBIC Blood Culture adequate volume   Culture   Final    NO GROWTH  2 DAYS Performed at [facility], [address] [place], [place] [address]    Report Status PENDING  Incomplete  Blood Culture (routine x 2)     Status: None (Preliminary result)   Collection Time: 11/22/17  7:00 PM  Result Value Ref Range Status   Specimen Description BLOOD LAC  Final   Special Requests   Final    BOTTLES DRAWN AEROBIC AND ANAEROBIC Blood Culture adequate volume   Culture   Final    NO GROWTH 2 DAYS Performed at [facility], [address] [place], [place] [address]    Report Status PENDING  Incomplete     Studies: Dg Chest Port 1 View  Result Date: 11/22/2017 CLINICAL DATA:  Dyspnea, cough and body ache EXAM: PORTABLE CHEST 1 VIEW  COMPARISON:  04/25/2016 FINDINGS: The heart size and mediastinal contours are within normal limits. Moderate aortic atherosclerosis at the arch without aneurysm. Hyperinflated lungs compatible COPD with attenuated pulmonary vessels. The visualized skeletal structures are unremarkable. IMPRESSION: COPD.  No active pulmonary disease.  Aortic atherosclerosis. Electronically Signed   By: Ashley Royalty M.D.   On: 11/22/2017 19:25    Scheduled Meds: . aspirin EC  81 mg Oral q morning - 10a  . azithromycin  250 mg Oral Daily  . docusate sodium  100 mg Oral BID  . enoxaparin (LOVENOX) injection  40 mg Subcutaneous Q24H  . feeding supplement (ENSURE ENLIVE)  237 mL Oral BID BM  . ipratropium-albuterol  3 mL Nebulization Q6H  . loratadine  10 mg Oral Daily  . losartan  50 mg Oral q morning - 10a  . metoprolol succinate  25 mg Oral q morning - 10a  . mometasone-formoterol  2 puff Inhalation BID  . multivitamin with minerals  1 tablet Oral q morning - 10a  . pantoprazole  80 mg Oral Daily  . [START ON 11/25/2017] predniSONE  30 mg Oral Q breakfast   Followed by  . [START ON 11/26/2017] predniSONE  20 mg Oral Q breakfast   Followed by  . [START ON 11/27/2017] predniSONE  10 mg Oral Q breakfast   Followed by  . [START ON 11/28/2017] predniSONE  5 mg Oral Q breakfast  . sodium chloride flush  3 mL Intravenous Q12H  . sucralfate  1 g Oral BID AC  . theophylline  300 mg Oral q morning - 10a   Continuous Infusions:  Assessment/Plan:  1.  Acute on chronic hypoxic respiratory failure.  Patient chronically on 2 L. 2.  COPD exacerbation.  continue prednisone taper.  Make nebulizers standing dose 3.  Essential hypertension on Cozaar and metoprolol 4.  Acute kidney injury.  Improved.   5.  History of bladder cancer 6.  Weakness physical therapy evaluation    Code Status:     Code Status Orders  (From admission, onward)        Start     Ordered   11/23/17 0220  Full code  Continuous     11/23/17 0219     Code Status History    Date Active Date Inactive Code Status Order ID Comments User Context   04/25/2016 14:15 04/29/2016 14:13 Full Code [id number]  [name], MD ED   10/06/2015 14:07 10/12/2015 17:55 Full Code [id number]  [name], MD ED    [facility] Directive Documentation     Most Recent Value  Type of [facility] Directive  Living will, Healthcare Power of Attorney  Pre-existing out of facility DNR order (yellow  form or pink MOST form)  No data  "MOST" Form in Place?  No data     Family Communication: Family at bedside Disposition Plan: Hopefully home tomorrow  Time spent: 28 minutes  [place]

## 2017-11-25 MED ORDER — FLUTICASONE PROPIONATE 50 MCG/ACT NA SUSP
2.0000 | Freq: Every day | NASAL | Status: DC
  Administered 2017-11-25: 2 via NASAL
  Filled 2017-11-25: qty 16

## 2017-11-25 MED ORDER — METHYLPREDNISOLONE SODIUM SUCC 40 MG IJ SOLR
40.0000 mg | Freq: Two times a day (BID) | INTRAMUSCULAR | Status: DC
  Administered 2017-11-25 – 2017-11-27 (×4): 40 mg via INTRAVENOUS
  Filled 2017-11-25 (×4): qty 1

## 2017-11-25 MED ORDER — NYSTATIN 100000 UNIT/ML MT SUSP
5.0000 mL | Freq: Four times a day (QID) | OROMUCOSAL | Status: DC
  Administered 2017-11-25 – 2017-11-27 (×9): 500000 [IU] via ORAL
  Filled 2017-11-25 (×9): qty 5

## 2017-11-25 MED ORDER — KETOROLAC TROMETHAMINE 15 MG/ML IJ SOLN
15.0000 mg | Freq: Once | INTRAMUSCULAR | Status: AC
  Administered 2017-11-25: 15 mg via INTRAVENOUS
  Filled 2017-11-25: qty 1

## 2017-11-25 NOTE — Progress Notes (Signed)
Patient ID: Catherine Landry, female   DOB: Mar 24, 1936, 82 y.o.   MRN: [id number]   [facility] PROGRESS NOTE  Catherine Landry RFF:[id number] DOB: 09-Jun-1936 DOA: 11/22/2017 PCP: Tracie Harrier, MD  HPI/Subjective: Patient still not feeling well.  Had a rough night with coughing and wheezing and shortness of breath.  Still having headache and some nasal congestion from where they did a flu swab.  Objective: Vitals:   11/25/17 0900 11/25/17 1404  BP:    Pulse: (!) 101   Resp:    Temp:    SpO2: 93% 94%    Filed Weights   11/23/17 0227 11/24/17 0353 11/25/17 0338  Weight: 65 kg (143 lb 4.8 oz) 64.6 kg (142 lb 8 oz) 65.8 kg (145 lb)    ROS: Review of Systems  Constitutional: Negative for chills and fever.  Eyes: Negative for blurred vision.  Respiratory: Positive for cough, shortness of breath and wheezing.   Cardiovascular: Negative for chest pain.  Gastrointestinal: Negative for abdominal pain, constipation, diarrhea, nausea and vomiting.  Genitourinary: Negative for dysuria.  Musculoskeletal: Negative for joint pain.  Neurological: Positive for headaches. Negative for dizziness.   Exam: Physical Exam  HENT:  Nose: No mucosal edema.  Mouth/Throat: No oropharyngeal exudate or posterior oropharyngeal edema.  Thrush on the tongue  Eyes: Conjunctivae, EOM and lids are normal. Pupils are equal, round, and reactive to light.  Neck: No JVD present. Carotid bruit is not present. No edema present. No thyroid mass and no thyromegaly present.  Cardiovascular: S1 normal and S2 normal. Exam reveals no gallop.  No murmur heard. Pulses:      Dorsalis pedis pulses are 2+ on the right side, and 2+ on the left side.  Respiratory: No respiratory distress. She has decreased breath sounds in the right middle field, the right lower field, the left middle field and the left lower field. She has wheezes in the right middle field, the right lower field, the left middle field and the  left lower field. She has no rhonchi. She has no rales.  GI: Soft. Bowel sounds are normal. There is no tenderness.  Musculoskeletal:       Right shoulder: She exhibits no swelling.  Lymphadenopathy:    She has no cervical adenopathy.  Neurological: She is alert. No cranial nerve deficit.  Skin: Skin is warm. No rash noted. Nails show no clubbing.  Psychiatric: She has a normal mood and affect.      Data Reviewed: Basic Metabolic Panel: Recent Labs  Lab 11/22/17 1859  NA 135  K 4.3  CL 99*  CO2 27  GLUCOSE 123*  BUN 18  CREATININE 1.04*  CALCIUM 9.2   Liver Function Tests: Recent Labs  Lab 11/22/17 1859  AST 29  ALT 15  ALKPHOS 44  BILITOT 1.2  PROT 7.4  ALBUMIN 4.3   CBC: Recent Labs  Lab 11/22/17 1859  WBC 13.3*  NEUTROABS 10.6*  HGB 12.1  HCT 36.2  MCV 97.3  PLT 177   Cardiac Enzymes: Recent Labs  Lab 11/22/17 1859  TROPONINI <0.03     Recent Results (from the past 240 hour(s))  Blood Culture (routine x 2)     Status: None (Preliminary result)   Collection Time: 11/22/17  6:59 PM  Result Value Ref Range Status   Specimen Description BLOOD LEFT WRIST  Final   Special Requests   Final    BOTTLES DRAWN AEROBIC AND ANAEROBIC Blood Culture adequate volume   Culture  Final    NO GROWTH 3 DAYS Performed at [facility], [address] [place], [place] [address]    Report Status PENDING  Incomplete  Blood Culture (routine x 2)     Status: None (Preliminary result)   Collection Time: 11/22/17  7:00 PM  Result Value Ref Range Status   Specimen Description BLOOD LAC  Final   Special Requests   Final    BOTTLES DRAWN AEROBIC AND ANAEROBIC Blood Culture adequate volume   Culture   Final    NO GROWTH 3 DAYS Performed at [facility], [address] [place], [place] [address]    Report Status PENDING  Incomplete      Scheduled Meds: . aspirin EC  81 mg Oral q morning - 10a  . azithromycin  250 mg Oral Daily  . docusate  sodium  100 mg Oral BID  . enoxaparin (LOVENOX) injection  40 mg Subcutaneous Q24H  . feeding supplement (ENSURE ENLIVE)  237 mL Oral BID BM  . fluticasone  2 spray Each Nare Daily  . ipratropium-albuterol  3 mL Nebulization Q6H  . loratadine  10 mg Oral Daily  . losartan  50 mg Oral q morning - 10a  . methylPREDNISolone (SOLU-MEDROL) injection  40 mg Intravenous Q12H  . metoprolol succinate  25 mg Oral q morning - 10a  . mometasone-formoterol  2 puff Inhalation BID  . multivitamin with minerals  1 tablet Oral q morning - 10a  . nystatin  5 mL Oral QID  . pantoprazole  80 mg Oral Daily  . sodium chloride flush  3 mL Intravenous Q12H  . sucralfate  1 g Oral BID AC  . theophylline  300 mg Oral q morning - 10a   Continuous Infusions:  Assessment/Plan:  1.  Acute on chronic hypoxic respiratory failure.  Patient chronically on 2 L.  Patient now back to baseline oxygen. 2.  COPD exacerbation.   start Solu-Medrol this afternoon and again tomorrow morning.  Continue nebulizer treatments standing dose. 3.  Essential hypertension on Cozaar and metoprolol 4.  Acute kidney injury.  Improved.   5.  History of bladder cancer 6.  Weakness physical therapy evaluation   Appreciated.  They recommended home with home health. Thrush.  Start nystatin swish and swallow    Code Status:     Code Status Orders  (From admission, onward)        Start     Ordered   11/23/17 0220  Full code  Continuous     11/23/17 0219    Code Status History    Date Active Date Inactive Code Status Order ID Comments User Context   04/25/2016 14:15 04/29/2016 14:13 Full Code [id number]  [name], MD ED   10/06/2015 14:07 10/12/2015 17:55 Full Code [id number]  [name], MD ED    Advance Directive Documentation     Most Recent Value  Type of Advance Directive  Living will, Healthcare Power of Attorney  Pre-existing out of facility DNR order (yellow form or pink MOST form)  No data  "MOST" Form in  Place?  No data     Family Communication: Family yesterday Disposition Plan: Reevaluate daily on when to go home   Time spent: 25 minutes  [place]

## 2017-11-25 NOTE — Evaluation (Signed)
Physical Therapy Evaluation Patient Details Name: Catherine Landry MRN: [id number] DOB: 1936/01/09 Today's Date: 11/25/2017   History of Present Illness  Pt admitted for COPD exacerbation, acute respiratory failure and community acquired pneumonia.  PMH includes Raynaud's, renal insufficiancey, Htn, COPD, CA and asthma.  Clinical Impression  Pt is an 82 year old female who lives in a one story house with her husband.  She is independent with all activity and does not require an AD at baseline, though she reports that her legs have been giving out when she walks longer distances.  Pt was in bed upon PT arrival and able to perform bed mobility without PT assist.  Pt presented with weakness of UE and WNL strength of LE, slightly decreased sensation in bilateral feet.  Pt performed STS with bilateral UE and was more comfortable holding on to RW once standing.  PT did not provide VC's, only supervision due to pt reported weakness.  PT advised pt to use RW due to recent onset of fatigue and pt stating that she feels more SOB this morning than she did yesterday.  Pt was able to ambulate in room demonstrating good gait mechanics and presenting with 93% O2 levels.  Pt static stance testing and TUG time indicate a fall risk in home and community during static and dynamic functional activity.  PT advised pt to use RW for balance and energy conservation at this time.  Pt will continue to benefit from skilled PT for tolerance to activity, use of AD and functional mobility.    Follow Up Recommendations Home health PT(Pt may benefit from [facility] program.)    Clinical biochemist with 5" wheels    Recommendations for Other Services       Precautions / Restrictions Precautions Precautions: Fall Restrictions Weight Bearing Restrictions: No      Mobility  Bed Mobility Overal bed mobility: Modified Independent             General bed mobility comments: Uses bedrail but  is otherwise able to perform bed mobility independently- no increased time.  Transfers Overall transfer level: Needs assistance Equipment used: Rolling walker (2 wheeled) Transfers: Sit to/from Stand Sit to Stand: Supervision         General transfer comment: Pt able to physically initiate STS and stand upright without VC's, only supervision due to pt reported weakness.  Ambulation/Gait Ambulation/Gait assistance: Supervision Ambulation Distance (Feet): 30 Feet Assistive device: Rolling walker (2 wheeled)     Gait velocity interpretation: Below normal speed for age/gender General Gait Details: Pt completed ambulation with no rest breaks though stated that her legs "feel like jelly".  Pt remained at 93% O2 level throughout ambulation and demonstrated good foot clearance and knee and hip flexion during swing phase.  PT provided min VC's for management of RW and pt was able to follow.  Stairs            Wheelchair Mobility    Modified Rankin (Stroke Patients Only)       Balance Overall balance assessment: Modified Independent(Static balance assessment: Quiet stance feet together: 10- sec with increased a-p sway, EC: 8 sec with increased a-p sway, tandem: L: 4 sec, R: 3 sec.)                               Standardized Balance Assessment Standardized Balance Assessment : TUG: Timed Up and Go Test  Timed Up and Go Test TUG: Normal TUG Normal TUG (seconds): 14     Pertinent Vitals/Pain Pain Assessment: No/denies pain(Pt states that she is not having pain but that she doesn't  understandin why she is not feeling well this morning.  She is very SOB-more than normal.)    Home Living Family/patient expects to be discharged to:: Private residence Living Arrangements: Spouse/significant other Available Help at Discharge: Family;Available 24 hours/day;Friend(s) Type of Home: House Home Access: Stairs to enter Entrance Stairs-Rails: None Entrance  Stairs-Number of Steps: 2 in garage and at front door Home Layout: One level Home Equipment: Grab bars - tub/shower;Walker - 4 wheels      Prior Function Level of Independence: Independent         Comments: Was able to drive and get groceries.  has plans to hire someone to clean house for her due to difficulty with respiration.     Hand Dominance        Extremity/Trunk Assessment   Upper Extremity Assessment Upper Extremity Assessment: Generalized weakness(Grossly 3+/5 bilaterally)    Lower Extremity Assessment Lower Extremity Assessment: Overall WFL for tasks assessed(Grossly 4-/5 bilaterally. Minimally decreased sensation in bilateral feet reported.)    Cervical / Trunk Assessment Cervical / Trunk Assessment: Normal  Communication   Communication: No difficulties  Cognition Arousal/Alertness: Awake/alert Behavior During Therapy: WFL for tasks assessed/performed Overall Cognitive Status: Within Functional Limits for tasks assessed                                        General Comments      Exercises     Assessment/Plan    PT Assessment Patient needs continued PT services  PT Problem List Decreased strength;Decreased mobility;Decreased activity tolerance;Decreased balance;Decreased knowledge of use of DME;Impaired sensation       PT Treatment Interventions DME instruction;Therapeutic activities;Gait training;Therapeutic exercise;Stair training;Balance training;Functional mobility training;Patient/family education    PT Goals (Current goals can be found in the Care Plan section)  Acute Rehab PT Goals Patient Stated Goal: To return home and manage her home the best she can. PT Goal Formulation: With patient Time For Goal Achievement: 12/09/17 Potential to Achieve Goals: Good    Frequency Min 2X/week   Barriers to discharge        Co-evaluation               AM-PAC PT "6 Clicks" Daily Activity  Outcome Measure Difficulty  turning over in bed (including adjusting bedclothes, sheets and blankets)?: None Difficulty moving from lying on back to sitting on the side of the bed? : A Little Difficulty sitting down on and standing up from a chair with arms (e.g., wheelchair, bedside commode, etc,.)?: A Little Help needed moving to and from a bed to chair (including a wheelchair)?: A Little Help needed walking in hospital room?: A Little Help needed climbing 3-5 steps with a railing? : A Little 6 Click Score: 19    End of Session Equipment Utilized During Treatment: Gait belt;Oxygen Activity Tolerance: Patient limited by fatigue Patient left: with call bell/phone within reach;with chair alarm set;with nursing/sitter in room Nurse Communication: Mobility status PT Visit Diagnosis: Unsteadiness on feet (R26.81);Muscle weakness (generalized) (M62.81)    Time: 3016-0109 PT Time Calculation (min) (ACUTE ONLY): 25 min   Charges:   PT Evaluation $PT Eval Low Complexity: 1 Low PT Treatments $Therapeutic Activity: 8-22 mins   PT G Codes:  PT G-Codes **NOT FOR INPATIENT CLASS** Functional Assessment Tool Used: AM-PAC 6 Clicks Basic Mobility    Roxanne Gates, PT, DPT   Roxanne Gates 11/25/2017, 9:11 AM

## 2017-11-25 NOTE — Plan of Care (Signed)
Complained of a headache once, tylenol given with relief. Miralax also given pt has not had a BM since Tuesday. Up to [facility] with standby assist. 3L O2 which is chronic.

## 2017-11-26 MED ORDER — AMLODIPINE BESYLATE 5 MG PO TABS
5.0000 mg | ORAL_TABLET | Freq: Every day | ORAL | Status: DC
  Administered 2017-11-26 – 2017-11-27 (×2): 5 mg via ORAL
  Filled 2017-11-26 (×2): qty 1

## 2017-11-26 NOTE — Care Management (Signed)
Placed an Advanced COPD Protocol in patient's written chart for completion by attending.

## 2017-11-26 NOTE — Plan of Care (Signed)
  Clinical Measurements: Ability to maintain clinical measurements within normal limits will improve 11/26/2017 0224 - Progressing by Marylouise Stacks, RN   Clinical Measurements: Diagnostic test results will improve 11/26/2017 0224 - Progressing by Marylouise Stacks, RN   Clinical Measurements: Respiratory complications will improve 11/26/2017 0224 - Progressing by Marylouise Stacks, RN   Clinical Measurements: Cardiovascular complication will be avoided 11/26/2017 0224 - Progressing by Marylouise Stacks, RN

## 2017-11-26 NOTE — Progress Notes (Signed)
Patient ID: Catherine Landry, female   DOB: [birth year], 82 y.o.   MRN: [id number]   [facility] PROGRESS NOTE  Catherine Landry [id number] DOB: 1936/01/07 DOA: 11/22/2017 PCP: Tracie Harrier, MD  HPI/Subjective: Patient stated that she had a rough night with shortness of breath wheezing and cough.  She states the wheezing is worse at night.  Objective: Vitals:   11/26/17 1110 11/26/17 1141  BP:    Pulse:  84  Resp:    Temp:    SpO2: 94% 93%    Filed Weights   11/24/17 0353 11/25/17 0338 11/26/17 0335  Weight: 64.6 kg (142 lb 8 oz) 65.8 kg (145 lb) 65.8 kg (145 lb 1.6 oz)    ROS: Review of Systems  Constitutional: Negative for chills and fever.  Eyes: Negative for blurred vision.  Respiratory: Positive for cough, shortness of breath and wheezing.   Cardiovascular: Negative for chest pain.  Gastrointestinal: Negative for abdominal pain, constipation, diarrhea, nausea and vomiting.  Genitourinary: Negative for dysuria.  Musculoskeletal: Negative for joint pain.  Neurological: Negative for dizziness.   Exam: Physical Exam  HENT:  Nose: No mucosal edema.  Mouth/Throat: No oropharyngeal exudate or posterior oropharyngeal edema.  Thrush on the tongue  Eyes: Conjunctivae, EOM and lids are normal. Pupils are equal, round, and reactive to light.  Neck: No JVD present. Carotid bruit is not present. No edema present. No thyroid mass and no thyromegaly present.  Cardiovascular: S1 normal and S2 normal. Exam reveals no gallop.  No murmur heard. Pulses:      Dorsalis pedis pulses are 2+ on the right side, and 2+ on the left side.  Respiratory: No respiratory distress. She has decreased breath sounds in the right middle field, the right lower field, the left middle field and the left lower field. She has wheezes in the right middle field, the right lower field, the left middle field and the left lower field. She has no rhonchi. She has no rales.  GI: Soft. Bowel sounds  are normal. There is no tenderness.  Musculoskeletal:       Right shoulder: She exhibits no swelling.  Lymphadenopathy:    She has no cervical adenopathy.  Neurological: She is alert. No cranial nerve deficit.  Skin: Skin is warm. No rash noted. Nails show no clubbing.  Psychiatric: She has a normal mood and affect.      Data Reviewed: Basic Metabolic Panel: Recent Labs  Lab 11/22/17 1859  NA 135  K 4.3  CL 99*  CO2 27  GLUCOSE 123*  BUN 18  CREATININE 1.04*  CALCIUM 9.2   Liver Function Tests: Recent Labs  Lab 11/22/17 1859  AST 29  ALT 15  ALKPHOS 44  BILITOT 1.2  PROT 7.4  ALBUMIN 4.3   CBC: Recent Labs  Lab 11/22/17 1859  WBC 13.3*  NEUTROABS 10.6*  HGB 12.1  HCT 36.2  MCV 97.3  PLT 177   Cardiac Enzymes: Recent Labs  Lab 11/22/17 1859  TROPONINI <0.03     Recent Results (from the past 240 hour(s))  Blood Culture (routine x 2)     Status: None (Preliminary result)   Collection Time: 11/22/17  6:59 PM  Result Value Ref Range Status   Specimen Description BLOOD LEFT WRIST  Final   Special Requests   Final    BOTTLES DRAWN AEROBIC AND ANAEROBIC Blood Culture adequate volume   Culture   Final    NO GROWTH 4 DAYS Performed at [facility],  [place], [place] [address]    Report Status PENDING  Incomplete  Blood Culture (routine x 2)     Status: None (Preliminary result)   Collection Time: 11/22/17  7:00 PM  Result Value Ref Range Status   Specimen Description BLOOD LAC  Final   Special Requests   Final    BOTTLES DRAWN AEROBIC AND ANAEROBIC Blood Culture adequate volume   Culture   Final    NO GROWTH 4 DAYS Performed at [facility], [address] [place], [place] [address]    Report Status PENDING  Incomplete      Scheduled Meds: . aspirin EC  81 mg Oral q morning - 10a  . azithromycin  250 mg Oral Daily  . docusate sodium  100 mg Oral BID  . enoxaparin (LOVENOX) injection  40 mg Subcutaneous  Q24H  . feeding supplement (ENSURE ENLIVE)  237 mL Oral BID BM  . fluticasone  2 spray Each Nare Daily  . ipratropium-albuterol  3 mL Nebulization Q6H  . loratadine  10 mg Oral Daily  . losartan  50 mg Oral q morning - 10a  . methylPREDNISolone (SOLU-MEDROL) injection  40 mg Intravenous Q12H  . metoprolol succinate  25 mg Oral q morning - 10a  . mometasone-formoterol  2 puff Inhalation BID  . multivitamin with minerals  1 tablet Oral q morning - 10a  . nystatin  5 mL Oral QID  . pantoprazole  80 mg Oral Daily  . sodium chloride flush  3 mL Intravenous Q12H  . sucralfate  1 g Oral BID AC  . theophylline  300 mg Oral q morning - 10a    Assessment/Plan:  1.  Acute on chronic hypoxic respiratory failure.  Patient chronically on 2 L.  Patient now back to baseline oxygen.  With ambulating her pulse ox did go down into the 80s.  Can use 3 L with ambulation. 2.  COPD exacerbation.   Continue Solu-Medrol.  Continue nebulizer treatments standing dose.  Check theophylline level.  If patient still not improving can consider discontinuing metoprolol.   3.  Essential hypertension on Cozaar and metoprolol 4.  Acute kidney injury.  Improved.   5.  History of bladder cancer 6.  Weakness physical therapy evaluation   Appreciated.  They recommended home with home health. 7.  Thrush.  Start nystatin swish and swallow    Code Status:     Code Status Orders  (From admission, onward)        Start     Ordered   11/23/17 0220  Full code  Continuous     11/23/17 0219    Code Status History    Date Active Date Inactive Code Status Order ID Comments User Context   04/25/2016 14:15 04/29/2016 14:13 Full Code [id number]  [name], MD ED   10/06/2015 14:07 10/12/2015 17:55 Full Code [id number]  [name], MD ED    Advance Directive Documentation     Most Recent Value  Type of Advance Directive  Living will, Healthcare Power of Attorney  Pre-existing out of facility DNR order (yellow  form or pink MOST form)  No data  "MOST" Form in Place?  No data     Family Communication: Family today Disposition Plan: Reevaluate daily on when to go home   Time spent: 25 minutes  [place]

## 2017-11-26 NOTE — Progress Notes (Signed)
Physical Therapy Treatment Patient Details Name: Catherine Landry MRN: [id number] DOB: October 18, 1935 Today's Date: 11/26/2017    History of Present Illness Pt admitted for COPD exacerbation, acute respiratory failure and community acquired pneumonia.  PMH includes Raynaud's, renal insufficiancey, Htn, COPD, CA and asthma.    PT Comments    Pt agreeable to PT; denies pain, but notes BLE weakness. Pt recently ambulated around nurses station with need for rest breaks and lowered O2 saturation requiring increase in O2. Pt currently back on baseline O2 of 2 L. O2 saturation initially this session 93%; pt maintained O2 saturation of 92-93% throughout session during short ambulation, seated and stand exercises with appropriate rest breaks taken to manage SOB symptoms and O2 saturation. Pt noted to have 1 episode of knee buckling during stand march with Min A/guard required. Pt fatigues quickly in stand and educated on preventing falls by listening to body cues and resting before legs give out. Recommending use of rolling walker for out of bed mobility. Pt received up in chair and encouraged use of incentive spirometer. Continue PT to progress endurance and strength to improve all functional mobility to allow for a safe return home.   Follow Up Recommendations  Home health PT     Equipment Recommendations  Rolling walker with 5" wheels    Recommendations for Other Services       Precautions / Restrictions Precautions Precautions: Fall Restrictions Weight Bearing Restrictions: No    Mobility  Bed Mobility Overal bed mobility: Modified Independent             General bed mobility comments: use of bedrails; good speed  Transfers Overall transfer level: Needs assistance Equipment used: None Transfers: Sit to/from Stand Sit to Stand: Min guard         General transfer comment: no LOB, pt notes feeling mildly weak in the knees; no buckling  Ambulation/Gait Ambulation/Gait  assistance: Min guard Ambulation Distance (Feet): 20 Feet Assistive device: 1 person hand held assist     Gait velocity interpretation: Below normal speed for age/gender General Gait Details: slow, cautious ambulation in room ( pt recently walked around nurses station )   Financial [employer] (Stroke Patients Only)       Balance Overall balance assessment: Modified Independent(use of walker recommended d/t weak LEs; 1 buckle c stand ex)                                          Cognition Arousal/Alertness: Awake/alert Behavior During Therapy: WFL for tasks assessed/performed Overall Cognitive Status: Within Functional Limits for tasks assessed                                        Exercises General Exercises - Lower Extremity Long Arc Quad: AROM;Both;20 reps;Seated(in sets of 10) Hip ABduction/ADduction: Strengthening;Both;20 reps;Standing;Other (comment)(in sets of 10 with rest breaks; requies seated break post ex) Hip Flexion/Marching: AROM;Strengthening;Both;20 reps;Seated;Standing;Other (comment)(sets of 10 c rest/breath breaks; winded p stand; 1 buckle ) Toe Raises: AROM;Both;20 reps;Seated Heel Raises: AROM;Both;20 reps;Seated    General Comments        Pertinent Vitals/Pain Pain Assessment: No/denies pain    Home Living  Prior Function            PT Goals (current goals can now be found in the care plan section) Progress towards PT goals: Progressing toward goals    Frequency    Min 2X/week      PT Plan Current plan remains appropriate    Co-evaluation              AM-PAC PT "6 Clicks" Daily Activity  Outcome Measure  Difficulty turning over in bed (including adjusting bedclothes, sheets and blankets)?: None Difficulty moving from lying on back to sitting on the side of the bed? : A Little Difficulty sitting down on and standing up  from a chair with arms (e.g., wheelchair, bedside commode, etc,.)?: A Little Help needed moving to and from a bed to chair (including a wheelchair)?: A Little Help needed walking in hospital room?: A Little Help needed climbing 3-5 steps with a railing? : A Little 6 Click Score: 19    End of Session Equipment Utilized During Treatment: Gait belt;Oxygen Activity Tolerance: Patient limited by fatigue Patient left: in chair;with call bell/phone within reach;with chair alarm set   PT Visit Diagnosis: Unsteadiness on feet (R26.81);Muscle weakness (generalized) (M62.81)     Time: 4982-6415 PT Time Calculation (min) (ACUTE ONLY): 23 min  Charges:  $Gait Training: 8-22 mins $Therapeutic Exercise: 8-22 mins                    G Codes:        Larae Grooms, PTA 11/26/2017, 12:02 PM

## 2017-11-26 NOTE — Progress Notes (Signed)
Dr. Leslye Peer notified of elevated BP. Order for norvasc 5mg  PO daily starting now.

## 2017-11-27 LAB — CULTURE, BLOOD (ROUTINE X 2)
Culture: NO GROWTH
Culture: NO GROWTH
SPECIAL REQUESTS: ADEQUATE
SPECIAL REQUESTS: ADEQUATE

## 2017-11-27 LAB — BASIC METABOLIC PANEL
ANION GAP: 8 (ref 5–15)
BUN: 24 mg/dL — AB (ref 6–20)
CHLORIDE: 101 mmol/L (ref 101–111)
CO2: 29 mmol/L (ref 22–32)
Calcium: 8.7 mg/dL — ABNORMAL LOW (ref 8.9–10.3)
Creatinine, Ser: 0.96 mg/dL (ref 0.44–1.00)
GFR calc Af Amer: >60 mL/min (ref >60)
GFR, EST NON AFRICAN AMERICAN: 54 mL/min — AB (ref >60)
Glucose, Bld: 111 mg/dL — ABNORMAL HIGH (ref 65–99)
POTASSIUM: 4.5 mmol/L (ref 3.5–5.1)
SODIUM: 138 mmol/L (ref 135–145)

## 2017-11-27 LAB — CBC
HCT: 34.9 % — ABNORMAL LOW (ref 35.0–47.0)
HEMOGLOBIN: 11.7 g/dL — AB (ref 12.0–16.0)
MCH: 32.8 pg (ref 26.0–34.0)
MCHC: 33.4 g/dL (ref 32.0–36.0)
MCV: 98.1 fL (ref 80.0–100.0)
PLATELETS: 254 10*3/uL (ref 150–440)
RBC: 3.56 MIL/uL — AB (ref 3.80–5.20)
RDW: 15 % — ABNORMAL HIGH (ref 11.5–14.5)
WBC: 7.8 10*3/uL (ref 3.6–11.0)

## 2017-11-27 MED ORDER — PREDNISONE 10 MG PO TABS: ORAL_TABLET | ORAL | 0 refills | Status: DC

## 2017-11-27 MED ORDER — NYSTATIN 100000 UNIT/ML MT SUSP: 5.0000 mL | Freq: Four times a day (QID) | OROMUCOSAL | 0 refills | Status: AC

## 2017-11-27 MED ORDER — AZITHROMYCIN 250 MG PO TABS: 250.0000 mg | ORAL_TABLET | Freq: Every day | ORAL | 0 refills | Status: AC

## 2017-11-27 MED ORDER — THEOPHYLLINE ER 300 MG PO TB12
150.0000 mg | ORAL_TABLET | Freq: Two times a day (BID) | ORAL | Status: DC
  Administered 2017-11-27: 150 mg via ORAL
  Filled 2017-11-27 (×2): qty 1

## 2017-11-27 NOTE — Progress Notes (Signed)
Catherine Landry to be D/C'd Home per MD order.  Discussed prescriptions and follow up appointments with the patient. Prescriptions given to patient, medication list explained in detail. Pt verbalized understanding.  Allergies as of 11/27/2017   No Known Allergies     Medication List    TAKE these medications   ADVAIR DISKUS 250-50 MCG/DOSE Aepb Generic drug:  Fluticasone-Salmeterol Inhale 1 puff into the lungs 2 (two) times daily. *Rinse mouth after use*   aspirin EC 81 MG tablet Take 81 mg by mouth every morning.   azithromycin 250 MG tablet Commonly known as:  ZITHROMAX Take 1 tablet (250 mg total) by mouth daily for 5 days. What changed:    how much to take  how to take this  when to take this  additional instructions  Another medication with the same name was removed. Continue taking this medication, and follow the directions you see here.   CALCIUM 1200+D3 PO Take 1 tablet by mouth every morning.   COMBIVENT RESPIMAT 20-100 MCG/ACT Aers respimat Generic drug:  Ipratropium-Albuterol Inhale 1 puff into the lungs 4 (four) times daily as needed. For COPD.   levalbuterol 0.63 MG/3ML nebulizer solution Commonly known as:  XOPENEX Take 3 mLs (0.63 mg total) by nebulization every 6 (six) hours as needed for wheezing or shortness of breath. DX: COPD DX Code: J44.9   losartan 50 MG tablet Commonly known as:  COZAAR Take 50 mg by mouth every morning.   metoprolol succinate 25 MG 24 hr tablet Commonly known as:  TOPROL-XL Take 25 mg by mouth every morning.   multivitamin with minerals Tabs tablet Take 1 tablet by mouth every morning.   nystatin 100000 UNIT/ML suspension Commonly known as:  MYCOSTATIN Take 5 mLs (500,000 Units total) by mouth 4 (four) times daily for 7 days.   omeprazole 40 MG capsule Commonly known as:  PRILOSEC Take 40 mg by mouth as needed.   polyethylene glycol powder powder Commonly known as:  GLYCOLAX/MIRALAX Take 17 g by mouth daily as  needed for constipation. Mix with 4 to 8 ounces of fluid.   predniSONE 10 MG tablet Commonly known as:  DELTASONE Label  & dispense according to the schedule below. 5 Pills PO for 1 day then, 4 Pills PO for 1 day, 3 Pills PO for 1 day, 2 Pills PO for 1 day, 1 Pill PO for 1 days then STOP.   sucralfate 1 g tablet Commonly known as:  CARAFATE Take 1 g by mouth 2 (two) times daily before a meal.   theophylline 300 MG 24 hr capsule Commonly known as:  THEO-24 Take 1 capsule (300 mg total) by mouth every morning.       Vitals:   11/27/17 0438 11/27/17 0822  BP: (!) 148/80 (!) 155/78  Pulse: 88 82  Resp: 17   Temp: 98.5 F (36.9 C) 98.2 F (36.8 C)  SpO2: 95% 96%    Skin clean, dry and intact without evidence of skin break down, no evidence of skin tears noted. IV catheter discontinued intact. Site without signs and symptoms of complications. Dressing and pressure applied. Pt denies pain at this time. No complaints noted.  An After Visit Summary was printed and given to the patient. Patient escorted via [facility] with personal o2 tank, and D/C home via private auto.  Catherine Landry

## 2017-11-27 NOTE — Care Management Note (Signed)
Case Management Note  Patient Details  Name: Catherine Landry MRN: [id number] Date of Birth: 23-Mar-1936  Subjective/Objective:     Discussed discharge planning with Dr Verdell Carmine. COPD form signed by Dr Verdell Carmine and faxed to [facility]. A referral for HH=PT, RN, OT, Aide, SW was called to [facility].                Action/Plan:   Expected Discharge Date:                  Expected Discharge Plan:     In-House Referral:     Discharge planning Services     Post Acute Care Choice:    Choice offered to:     DME Arranged:    DME Agency:     HH Arranged:    HH Agency:     Status of Service:     If discussed at [employer] of Stay Meetings, dates discussed:    Additional Comments:  Lorenzo Pereyra A, RN 11/27/2017, 9:28 AM

## 2017-11-29 NOTE — Discharge Summary (Signed)
[facility] NAME: Catherine Landry    MR#:  [id number]  DATE OF BIRTH:  05-08-1936  DATE OF ADMISSION:  11/22/2017 ADMITTING PHYSICIAN: Harrie Foreman, MD  DATE OF DISCHARGE: 11/27/2017  1:00 PM  PRIMARY CARE PHYSICIAN: Tracie Harrier, MD    ADMISSION DIAGNOSIS:  COPD exacerbation ([place]) [J44.1] Acute respiratory failure with hypoxia ([place]) [J96.01] Community acquired pneumonia, unspecified laterality [J18.9]  DISCHARGE DIAGNOSIS:  Active Problems:   Acute on chronic respiratory failure with hypoxia ([place])   SECONDARY DIAGNOSIS:   Past Medical History:  Diagnosis Date  . Asthma   . Bladder cancer ([place]) 2015  . Cancer [facility]) 2001   kidney left   . COPD (chronic obstructive pulmonary disease) ([place])   . Hypertension   . Raynaud's disease   . Renal insufficiency     HOSPITAL COURSE:   82 year old female with past medical history of hypertension, COPD, history of bladder cancer, chronic renal insufficiency who presented to the hospital due to shortness of breath and noted to be in COPD exacerbation.  1. Acute on chronic respiratory failure with hypoxia-this was secondary to COPD exacerbation. Patient was treated with IV steroids, scheduled DuoNeb's, Pulmicort nebs and also maintaining on theophylline. She has improved and has less wheezing and bronchospasm and therefore not being discharged home. She will continue her baseline oxygen at 3 L.  2. COPD exacerbation-this was the cause of patient's worsening respiratory failure. Patient was treated with IV steroids, scheduled DuoNeb's, Pulmicort nebs. She has improved. She is now being discharged on oral prednisone taper,Advair, Combivent and a rescue albuterol inhaler. -She will continue her oxygen at 3 L continuously at home.  3.essential hypertension-patient will continue her metoprolol, losartan.  4. Thrush-patient was discharged on some nystatin swish and swallow for a few  days.  5. GERD-patient will continue omeprazole.  Patient is being discharged with home health nursing, physical therapy services.  DISCHARGE CONDITIONS:   stable  CONSULTS OBTAINED:    DRUG ALLERGIES:  No Known Allergies  DISCHARGE MEDICATIONS:   Allergies as of 11/27/2017   No Known Allergies     Medication List    TAKE these medications   ADVAIR DISKUS 250-50 MCG/DOSE Aepb Generic drug:  Fluticasone-Salmeterol Inhale 1 puff into the lungs 2 (two) times daily. *Rinse mouth after use*   aspirin EC 81 MG tablet Take 81 mg by mouth every morning.   azithromycin 250 MG tablet Commonly known as:  ZITHROMAX Take 1 tablet (250 mg total) by mouth daily for 5 days. What changed:    how much to take  how to take this  when to take this  additional instructions  Another medication with the same name was removed. Continue taking this medication, and follow the directions you see here.   CALCIUM 1200+D3 PO Take 1 tablet by mouth every morning.   COMBIVENT RESPIMAT 20-100 MCG/ACT Aers respimat Generic drug:  Ipratropium-Albuterol Inhale 1 puff into the lungs 4 (four) times daily as needed. For COPD.   levalbuterol 0.63 MG/3ML nebulizer solution Commonly known as:  XOPENEX Take 3 mLs (0.63 mg total) by nebulization every 6 (six) hours as needed for wheezing or shortness of breath. DX: COPD DX Code: J44.9   losartan 50 MG tablet Commonly known as:  COZAAR Take 50 mg by mouth every morning.   metoprolol succinate 25 MG 24 hr tablet Commonly known as:  TOPROL-XL Take 25 mg by mouth every morning.   multivitamin with minerals  Tabs tablet Take 1 tablet by mouth every morning.   nystatin 100000 UNIT/ML suspension Commonly known as:  MYCOSTATIN Take 5 mLs (500,000 Units total) by mouth 4 (four) times daily for 7 days.   omeprazole 40 MG capsule Commonly known as:  PRILOSEC Take 40 mg by mouth as needed.   polyethylene glycol powder powder Commonly known as:   GLYCOLAX/MIRALAX Take 17 g by mouth daily as needed for constipation. Mix with 4 to 8 ounces of fluid.   predniSONE 10 MG tablet Commonly known as:  DELTASONE Label  & dispense according to the schedule below. 5 Pills PO for 1 day then, 4 Pills PO for 1 day, 3 Pills PO for 1 day, 2 Pills PO for 1 day, 1 Pill PO for 1 days then STOP.   sucralfate 1 g tablet Commonly known as:  CARAFATE Take 1 g by mouth 2 (two) times daily before a meal.   theophylline 300 MG 24 hr capsule Commonly known as:  THEO-24 Take 1 capsule (300 mg total) by mouth every morning.         DISCHARGE INSTRUCTIONS:   DIET:  Cardiac diet  DISCHARGE CONDITION:  Stable  ACTIVITY:  Activity as tolerated  OXYGEN:  Home Oxygen: Yes.     Oxygen Delivery: 2 liters/min via Patient connected to nasal cannula oxygen  DISCHARGE LOCATION:  home   If you experience worsening of your admission symptoms, develop shortness of breath, life threatening emergency, suicidal or homicidal thoughts you must seek medical attention immediately by calling 911 or calling your MD immediately  if symptoms less severe.  You Must read complete instructions/literature along with all the possible adverse reactions/side effects for all the Medicines you take and that have been prescribed to you. Take any new Medicines after you have completely understood and accpet all the possible adverse reactions/side effects.   Please note  You were cared for by a hospitalist during your hospital stay. If you have any questions about your discharge medications or the care you received while you were in the hospital after you are discharged, you can call the unit and asked to speak with the hospitalist on call if the hospitalist that took care of you is not available. Once you are discharged, your primary care physician will handle any further medical issues. Please note that NO REFILLS for any discharge medications will be authorized once you are  discharged, as it is imperative that you return to your primary care physician (or establish a relationship with a primary care physician if you do not have one) for your aftercare needs so that they can reassess your need for medications and monitor your lab values.     Today   Shortness of breath improved and no further wheezing, bronchospasm. Wants to go home.   VITAL SIGNS:  Blood pressure (!) 155/78, pulse 82, temperature 98.2 F (36.8 C), temperature source Oral, resp. rate 17, height 5\' 3"  (1.6 m), weight 65.3 kg (143 lb 14.4 oz), SpO2 96 %.  I/O:  No intake or output data in the 24 hours ending 11/29/17 1538  PHYSICAL EXAMINATION:  GENERAL:  82 y.o.-year-old patient lying in the bed in no acute distress.  EYES: Pupils equal, round, reactive to light and accommodation. No scleral icterus. Extraocular muscles intact.  HEENT: Head atraumatic, normocephalic. Oropharynx and nasopharynx clear.  NECK:  Supple, no jugular venous distention. No thyroid enlargement, no tenderness.  LUNGS: Normal breath sounds bilaterally, no wheezing, rales,rhonchi. No use of accessory  muscles of respiration.  CARDIOVASCULAR: S1, S2 normal. No murmurs, rubs, or gallops.  ABDOMEN: Soft, non-tender, non-distended. Bowel sounds present. No organomegaly or mass.  EXTREMITIES: No pedal edema, cyanosis, or clubbing.  NEUROLOGIC: Cranial nerves II through XII are intact. No focal motor or sensory defecits b/l.  PSYCHIATRIC: The patient is alert and oriented x 3.  SKIN: No obvious rash, lesion, or ulcer.   DATA REVIEW:   CBC Recent Labs  Lab 11/27/17 0609  WBC 7.8  HGB 11.7*  HCT 34.9*  PLT 254    Chemistries  Recent Labs  Lab 11/22/17 1859 11/27/17 0609  NA 135 138  K 4.3 4.5  CL 99* 101  CO2 27 29  GLUCOSE 123* 111*  BUN 18 24*  CREATININE 1.04* 0.96  CALCIUM 9.2 8.7*  AST 29  --   ALT 15  --   ALKPHOS 44  --   BILITOT 1.2  --     Cardiac Enzymes Recent Labs  Lab 11/22/17 1859   TROPONINI <0.03    Microbiology Results  Results for orders placed or performed during the hospital encounter of 11/22/17  Blood Culture (routine x 2)     Status: None   Collection Time: 11/22/17  6:59 PM  Result Value Ref Range Status   Specimen Description BLOOD LEFT WRIST  Final   Special Requests   Final    BOTTLES DRAWN AEROBIC AND ANAEROBIC Blood Culture adequate volume   Culture   Final    NO GROWTH 5 DAYS Performed at [facility], [address], [place], [place] [address]    Report Status 11/27/2017 FINAL  Final  Blood Culture (routine x 2)     Status: None   Collection Time: 11/22/17  7:00 PM  Result Value Ref Range Status   Specimen Description BLOOD LAC  Final   Special Requests   Final    BOTTLES DRAWN AEROBIC AND ANAEROBIC Blood Culture adequate volume   Culture   Final    NO GROWTH 5 DAYS Performed at [facility], [address] [place], [place] [address]    Report Status 11/27/2017 FINAL  Final    RADIOLOGY:  No results found.    Management plans discussed with the patient, family and they are in agreement.  CODE STATUS:  Code Status History    Date Active Date Inactive Code Status Order ID Comments User Context   11/23/2017 02:19 11/27/2017 16:05 Full Code [id number]  Harrie Foreman, MD Inpatient  Advance Directive Documentation     Most Recent Value  Type of Advance Directive  Living will, Healthcare Power of Attorney  Pre-existing out of facility DNR order (yellow form or pink MOST form)  No data  "MOST" Form in Place?  No data      TOTAL TIME TAKING CARE OF THIS PATIENT: 40 minutes.    Henreitta Leber M.D on 11/29/2017 at 3:38 PM  Between 7am to 6pm - Pager - [phone]  After 6pm go to [email or web address] - Proofreader  [facility]  Office  [phone]  CC: Primary care physician; Tracie Harrier, MD

## 2017-11-30 LAB — THEOPHYLLINE LEVEL: THEOPHYLLINE LVL: 1.8 ug/mL — AB (ref 10.0–20.0)

## 2017-12-02 NOTE — Progress Notes (Deleted)
[place] [facility] Consultation      Assessment and Plan:  The patient is a 82 year old female with a history of invasive bladder cancer, emphysema, lung nodule, being followed up for COPD and lung nodule.   Lung Nodule. -CT chest in April 2016 Showed 0.7 cmm lung nodule in superior segment of Rt lower lobe  -Repeat scan at [place] continues to show nodules, on 07/16/16, stable from 07/11/15 --She is going to continue to follow up at the cancer center at [facility] for her CT scans.   Emphysema/COPD-- Group D with frequent exacerbation.  --No recent exacerbations, continue current regimen, she has stopped taking azithromycin, therefore, this was restarted every other day indefinitely. -We discussed the that she is to call and she is experiencing an exacerbation for a prednisone taper. We also discussed the possibility of starting on chronic prednisone in the future. She continues to experience exacerbations or decline in her breathing. -Chronic severe emphysema. -Continue advair, theophylline, combivent, and prn xopenex nebs.-CBC 10/06/15; absolute eosinophil count 0.5-- continue advair.   No orders of the defined types were placed in this encounter.    Dyspnea. --She is already maximally treated with inhaled medications and continues to experience continued decline in her breathing.  --She has graduated from pulmonary rehab.   Chronic hypoxic respiratory failure.  -Nocturnal hypoxia, continue oxygen at 2L qhs.   Anxiety.  --She get anxious and I think this contributes her dyspnea.   Bladder cancer. -We will need to follow her lung nodule, given risk for metastatic disease.  Previously discussed advance directives;  she does want life saving therapies but nothing long term. She does not think that she wants a feeding tube, because her mother had a stroke and was kept alive on a feeding tube for several years, she could not communicate during that time.   Date: 12/02/2017    MRN# [id number] Catherine Landry March 15, 1936  Referring Physician: Dr. Ginette Pitman.   Timmie Calix is a 82 y.o. old female seen in consultation for chief complaint of:    No chief complaint on file.   HPI:  The patient is a 82 year old female with a history of severely symptomatic COPD with frequent exacerbations. She is currently on Advair 250/50, theophylline, Combivent Respimat 4 times per day.   She returns to the clinic today as a follow-up visit, she notes cough and dyspnea, she is not wearing her oxygen at the time of evaluation. She feels that her breathing is doing better today than it has in previous visit.  She continues to get winded with he gets winded with minimal activity. She felt better while on the prednisone. She has run out of the azithro every MWF.   She has continue to use advair bid, combivent but she has been advised not to use it more than every 4 hours. She tried stopping theophylline but had to go back on it.    **CT chest 07/16/16; unchanged from previous.  **PFT; 12/08/16, FVC 55%, FEV1 33%, ratio of 44%. Mild improvement with bronchodilator. DLCO of 25%. No lung volumes. Consistent with severe emphysema.  She has a history of bladder cancer, chronic kidney disease, GERD, invasive bladder cancer, and left  nephroureterectomy in 2002. For her last note with oncology in October 2016, her restaging CT was negative for recurrent or metastatic disease.  **CT chest 01/16/16: at [facility] and compared with previous from 07/11/15 Impression: 1. Several scattered reticular and nodular opacities are new from prior, favored to be infectious/inflammatory in  etiology, although at least one nodule is solitary and could conceivably represent a neoplasm in the background setting of emphysema. Recommend 3 month follow-up CT.  **CT chest in April 2016 Showed 0.7 cmm lung nodule in superior segment of Rt lower lobe  **CT chest images and report from 10/10/2015 and compared with  previous from from 10/06/2015. There appeared to be a small right upper lobe nodule, as well as a small area of scarring/pneumonia which appeared to have resolved and the subsequent CT, although the nodule is still present. CT shows significant bilateral emphysema. There is also streaky atelectasis seen in the left base.   Medication:   Reviewed.     Allergies:  Patient has no known allergies.  Review of Systems: Gen:  Denies  fever, sweats, chills HEENT: Denies blurred vision, double vision.  Cvc:  No dizziness, chest pain. Resp:   Denies cough  Gi: Denies swallowing difficulty, stomach pain. Gu:  Denies bladder incontinence, burning urine Ext:   No Joint pain, stiffness. Skin: No skin rash,  hives Endoc:  No polyuria, polydipsia. Psych: No depression, insomnia. Other:  All other systems were reviewed with the patient and were negative other that what is mentioned in the HPI.   Physical Examination:   VS: There were no vitals taken for this visit. on room air. General Appearance: No distress  Neuro:without focal findings,  speech normal,  HEENT: PERRLA, EOM intact.   Pulmonary: normal breath sounds,  Decreased air entry bilaterally. No wheezing.  CardiovascularNormal S1,S2.  No m/r/g.   Abdomen: Benign, Soft, non-tender. Renal:  No costovertebral tenderness  GU:  No performed at this time. Endoc: No evident thyromegaly, no signs of acromegaly. Skin:   warm, no rashes, no ecchymosis  Extremities: normal, no cyanosis, clubbing.  Other findings:    LABORATORY PANEL:   CBC Recent Labs  Lab 11/27/17 0609  WBC 7.8  HGB 11.7*  HCT 34.9*  PLT 254   ------------------------------------------------------------------------------------------------------------------  Chemistries  Recent Labs  Lab 11/27/17 0609  NA 138  K 4.5  CL 101  CO2 29  GLUCOSE 111*  BUN 24*  CREATININE 0.96  CALCIUM 8.7*    ------------------------------------------------------------------------------------------------------------------  Cardiac Enzymes No results for input(s): TROPONINI in the last 168 hours. ------------------------------------------------------------  RADIOLOGY:  No results found.     Thank  you for the consultation and for allowing [facility] to assist in the care of your patient. Our recommendations are noted above.  Please contact us if we can be of further service.   Marda Stalker, MD.  Board Certified in Internal Medicine, Pulmonary Medicine, [place], and Sleep Medicine.  [facility]  Patricia Pesa, M.D.  Merton Border, M.D

## 2017-12-03 ENCOUNTER — Ambulatory Visit: Payer: Medicare Other | Admitting: Internal Medicine

## 2017-12-06 ENCOUNTER — Ambulatory Visit: Payer: Medicare Other | Admitting: Internal Medicine

## 2017-12-06 NOTE — Progress Notes (Deleted)
[place] [facility] Consultation      Assessment and Plan:  The patient is a 82 year old female with a history of invasive bladder cancer, emphysema, lung nodule, being followed up for COPD and lung nodule.   Lung Nodule. -CT chest in April 2016 Showed 0.7 cmm lung nodule in superior segment of Rt lower lobe  -Repeat scan at [place] continues to show nodules, on 07/16/16, stable from 07/11/15 --She is going to continue to follow up at the cancer center at [facility] for her CT scans.   Emphysema/COPD-- Group D with frequent exacerbation.  --No recent exacerbations, continue current regimen, she has stopped taking azithromycin, therefore, this was restarted every other day indefinitely. -We discussed the that she is to call and she is experiencing an exacerbation for a prednisone taper. We also discussed the possibility of starting on chronic prednisone in the future. She continues to experience exacerbations or decline in her breathing. -Chronic severe emphysema. -Continue advair, theophylline, combivent, and prn xopenex nebs.-CBC 10/06/15; absolute eosinophil count 0.5-- continue advair.     Dyspnea. --She is already maximally treated with inhaled medications and continues to experience continued decline in her breathing.  --She has graduated from pulmonary rehab.   Chronic hypoxic respiratory failure.  -Nocturnal hypoxia, continue oxygen at 2L qhs.   Anxiety.  --She get anxious and I think this contributes her dyspnea.   Bladder cancer. -We will need to follow her lung nodule, given risk for metastatic disease.    Date: 12/06/2017  MRN# [id number] Catherine Landry 07/13/1936  Referring Physician: Dr. Ginette Pitman.   Catherine Landry is a 82 y.o. old female seen in consultation for chief complaint of:    No chief complaint on file.   HPI:  The patient is a 82 year old female with a history of severely symptomatic COPD with frequent exacerbations. She is currently on  Advair 250/50, theophylline, Combivent Respimat 4 times per day.   She returns to the clinic today as a follow-up visit, she notes cough and dyspnea, she is not wearing her oxygen at the time of evaluation. She feels that her breathing is doing better today than it has in previous visit.  She continues to get winded with he gets winded with minimal activity. She felt better while on the prednisone. She has run out of the azithro every MWF.   She has continue to use advair bid, combivent but she has been advised not to use it more than every 4 hours. She tried stopping theophylline but had to go back on it.    **CT chest 07/16/16; unchanged from previous.  **PFT; 12/08/16, FVC 55%, FEV1 33%, ratio of 44%. Mild improvement with bronchodilator. DLCO of 25%. No lung volumes. Consistent with severe emphysema.  She has a history of bladder cancer, chronic kidney disease, GERD, invasive bladder cancer, and left  nephroureterectomy in 2002. For her last note with oncology in October 2016, her restaging CT was negative for recurrent or metastatic disease.  **CT chest 01/16/16: at [facility] and compared with previous from 07/11/15 Impression: 1. Several scattered reticular and nodular opacities are new from prior, favored to be infectious/inflammatory in etiology, although at least one nodule is solitary and could conceivably represent a neoplasm in the background setting of emphysema. Recommend 3 month follow-up CT.  **CT chest in April 2016 Showed 0.7 cmm lung nodule in superior segment of Rt lower lobe  **CT chest images and report from 10/10/2015 and compared with previous from from 10/06/2015. There appeared to be  a small right upper lobe nodule, as well as a small area of scarring/pneumonia which appeared to have resolved and the subsequent CT, although the nodule is still present. CT shows significant bilateral emphysema. There is also streaky atelectasis seen in the left base.   Medication:     Reviewed.     Allergies:  Patient has no known allergies.  Review of Systems: Gen:  Denies  fever, sweats, chills HEENT: Denies blurred vision, double vision.  Cvc:  No dizziness, chest pain. Resp:   Denies cough  Gi: Denies swallowing difficulty, stomach pain. Gu:  Denies bladder incontinence, burning urine Ext:   No Joint pain, stiffness. Skin: No skin rash,  hives Endoc:  No polyuria, polydipsia. Psych: No depression, insomnia. Other:  All other systems were reviewed with the patient and were negative other that what is mentioned in the HPI.   Physical Examination:   VS: There were no vitals taken for this visit. on room air. General Appearance: No distress  Neuro:without focal findings,  speech normal,  HEENT: PERRLA, EOM intact.   Pulmonary: normal breath sounds,  Decreased air entry bilaterally. No wheezing.  CardiovascularNormal S1,S2.  No m/r/g.   Abdomen: Benign, Soft, non-tender. Renal:  No costovertebral tenderness  GU:  No performed at this time. Endoc: No evident thyromegaly, no signs of acromegaly. Skin:   warm, no rashes, no ecchymosis  Extremities: normal, no cyanosis, clubbing.  Other findings:    LABORATORY PANEL:   CBC No results for input(s): WBC, HGB, HCT, PLT in the last 168 hours. ------------------------------------------------------------------------------------------------------------------  Chemistries  No results for input(s): NA, K, CL, CO2, GLUCOSE, BUN, CREATININE, CALCIUM, MG, AST, ALT, ALKPHOS, BILITOT in the last 168 hours.  Invalid input(s): GFRCGP ------------------------------------------------------------------------------------------------------------------  Cardiac Enzymes No results for input(s): TROPONINI in the last 168 hours. ------------------------------------------------------------  RADIOLOGY:  No results found.     Thank  you for the consultation and for allowing [facility] to  assist in the care of your patient. Our recommendations are noted above.  Please contact us if we can be of further service.   Marda Stalker, MD.  Board Certified in Internal Medicine, Pulmonary Medicine, [place], and Sleep Medicine.  [facility]  Patricia Pesa, M.D.  Merton Border, M.D

## 2017-12-07 ENCOUNTER — Ambulatory Visit: Payer: Medicare Other | Admitting: Internal Medicine

## 2017-12-12 NOTE — Progress Notes (Signed)
[facility] Consultation      Assessment and Plan:  The patient is a 82 year old female with a history of invasive bladder cancer, emphysema, lung nodule, being followed up for COPD and lung nodule.   Lung Nodule. -CT chest in April 2016 Showed 0.7 cmm lung nodule in superior segment of Rt lower lobe  -Repeat scan at [place] continues to show nodules, on 07/16/16, stable from 07/11/15 --She is going to continue to follow up at the cancer center at [facility] for her CT scans.   Emphysema/COPD-- Group D with frequent exacerbation.  --recent exacerbation, continue current regimen -Chronic severe emphysema. -Continue advair, theophylline, azithromycin, Combivent, and prn xopenex nebs. -CBC 10/06/15; absolute eosinophil count 0.5-- continue advair.   Dyspnea. --She is already maximally treated with inhaled medications and continues to experience continued decline in her breathing.  --She has graduated from pulmonary rehab.   Chronic hypoxic respiratory failure.  -Nocturnal hypoxia, continue oxygen at 2L qhs.   Anxiety.  --She gets anxious and I think this contributes her dyspnea.   Bladder cancer. -We will need to follow her lung nodule, given risk for metastatic disease.  She continues to follow-up at [facility] for follow-up CAT scans for this.  S/p pneumovax and prevnar.  Date: 12/12/2017  MRN# [id number] Catherine Landry April 07, 1936  Referring Physician: Dr. Ginette Pitman.   Catherine Landry is a 82 y.o. old female seen in consultation for chief complaint of:    Chief Complaint  Patient presents with  . Hospitalization Follow-up    copd exacerbation patient she is back to baseline on breathing. She is on 2 L 02 cont and 2 L pulse with activity.    HPI:  The patient is a 82 year old female with a history of severely symptomatic COPD with frequent exacerbations. She is currently on Advair 250/50, theophylline, Combivent Respimat 4 times per day, azithro 3 days per week.  She remains on 2L oxygen.   She returns to the clinic today as a follow-up visit, she was recently hospitalized earlier this month for COPD exacerbation.She feels that her breathing is doing better today, nearly back to baseline.   **CT chest 07/16/16; unchanged from previous.  **PFT; 12/08/16, FVC 55%, FEV1 33%, ratio of 44%. Mild improvement with bronchodilator. DLCO of 25%. No lung volumes. Consistent with severe emphysema.  She has a history of bladder cancer, chronic kidney disease, GERD, invasive bladder cancer, and left  nephroureterectomy in 2002. For her last note with oncology in October 2016, her restaging CT was negative for recurrent or metastatic disease.  **CT chest 01/16/16: at [facility] and compared with previous from 07/11/15 Impression: 1. Several scattered reticular and nodular opacities are new from prior, favored to be infectious/inflammatory in etiology, although at least one nodule is solitary and could conceivably represent a neoplasm in the background setting of emphysema. Recommend 3 month follow-up CT.  **CT chest in April 2016 Showed 0.7 cmm lung nodule in superior segment of Rt lower lobe  **CT chest images and report from 10/10/2015 and compared with previous from from 10/06/2015. There appeared to be a small right upper lobe nodule, as well as a small area of scarring/pneumonia which appeared to have resolved on the subsequent CT, although the nodule is still present. CT shows significant bilateral emphysema. There is also streaky atelectasis seen in the left base.   Medication:   Reviewed.     Allergies:  Patient has no known allergies.  Review of Systems: Gen:  Denies  fever, sweats,  chills HEENT: Denies blurred vision, double vision.  Cvc:  No dizziness, chest pain. Resp:   Denies cough  Gi: Denies swallowing difficulty, stomach pain. Gu:  Denies bladder incontinence, burning urine Ext:   No Joint pain, stiffness. Skin: No skin rash,  hives Endoc:  No  polyuria, polydipsia. Psych: No depression, insomnia. Other:  All other systems were reviewed with the patient and were negative other that what is mentioned in the HPI.   Physical Examination:   VS: BP 106/60 (BP Location: Left Arm, Cuff Size: Normal)   Pulse 91   Resp 16   Ht 5\' 3"  (1.6 m)   Wt 141 lb (64 kg)   SpO2 94%   BMI 24.98 kg/m   General Appearance: No distress  Neuro:without focal findings,  speech normal,  HEENT: PERRLA, EOM intact.   Pulmonary: normal breath sounds,  Decreased air entry bilaterally. No wheezing.  CardiovascularNormal S1,S2.  No m/r/g.   Abdomen: Benign, Soft, non-tender. Renal:  No costovertebral tenderness  GU:  No performed at this time. Endoc: No evident thyromegaly, no signs of acromegaly. Skin:   warm, no rashes, no ecchymosis  Extremities: normal, no cyanosis, clubbing.  Other findings:    LABORATORY PANEL:   CBC No results for input(s): WBC, HGB, HCT, PLT in the last 168 hours. ------------------------------------------------------------------------------------------------------------------  Chemistries  No results for input(s): NA, K, CL, CO2, GLUCOSE, BUN, CREATININE, CALCIUM, MG, AST, ALT, ALKPHOS, BILITOT in the last 168 hours.  Invalid input(s): GFRCGP ------------------------------------------------------------------------------------------------------------------  Cardiac Enzymes No results for input(s): TROPONINI in the last 168 hours. ------------------------------------------------------------  RADIOLOGY:  No results found.     Thank  you for the consultation and for allowing [facility] to assist in the care of your patient. Our recommendations are noted above.  Please contact us if we can be of further service.   Marda Stalker, MD.  Board Certified in Internal Medicine, Pulmonary Medicine, [place], and Sleep Medicine.  [facility]  Patricia Pesa, M.D.  Merton Border, M.D

## 2017-12-13 ENCOUNTER — Encounter: Payer: Self-pay | Admitting: Internal Medicine

## 2017-12-13 ENCOUNTER — Ambulatory Visit (INDEPENDENT_AMBULATORY_CARE_PROVIDER_SITE_OTHER): Payer: Medicare Other | Admitting: Internal Medicine

## 2017-12-13 VITALS — BP 106/60 | HR 91 | Resp 16 | Ht 63.0 in | Wt 141.0 lb

## 2017-12-13 DIAGNOSIS — J441 Chronic obstructive pulmonary disease with (acute) exacerbation: Secondary | ICD-10-CM

## 2017-12-13 DIAGNOSIS — J9621 Acute and chronic respiratory failure with hypoxia: Secondary | ICD-10-CM | POA: Diagnosis not present

## 2017-12-13 DIAGNOSIS — R0609 Other forms of dyspnea: Secondary | ICD-10-CM

## 2017-12-13 NOTE — Patient Instructions (Signed)
Continue current medications.  Call back earlier if you have trouble with your breathing.

## 2017-12-17 ENCOUNTER — Other Ambulatory Visit: Payer: Self-pay | Admitting: Internal Medicine

## 2017-12-17 DIAGNOSIS — Z1231 Encounter for screening mammogram for malignant neoplasm of breast: Secondary | ICD-10-CM

## 2018-01-25 ENCOUNTER — Ambulatory Visit
Admission: RE | Admit: 2018-01-25 | Discharge: 2018-01-25 | Disposition: A | Discharge status: Home / Self Care | Payer: Medicare Other | Source: Ambulatory Visit | Attending: Internal Medicine | Admitting: Internal Medicine

## 2018-01-25 DIAGNOSIS — Z1231 Encounter for screening mammogram for malignant neoplasm of breast: Secondary | ICD-10-CM | POA: Insufficient documentation

## 2018-01-28 ENCOUNTER — Other Ambulatory Visit: Payer: Self-pay | Admitting: Pulmonary Disease

## 2018-01-28 MED ORDER — LEVALBUTEROL HCL 0.63 MG/3ML IN NEBU: 0.6300 mg | INHALATION_SOLUTION | Freq: Four times a day (QID) | RESPIRATORY_TRACT | 5 refills | Status: DC | PRN

## 2018-02-11 ENCOUNTER — Encounter: Payer: Self-pay | Admitting: Internal Medicine

## 2018-02-11 ENCOUNTER — Ambulatory Visit (INDEPENDENT_AMBULATORY_CARE_PROVIDER_SITE_OTHER): Payer: Medicare Other | Admitting: Internal Medicine

## 2018-02-11 VITALS — BP 114/72 | HR 96 | Ht 63.0 in | Wt 141.1 lb

## 2018-02-11 DIAGNOSIS — J441 Chronic obstructive pulmonary disease with (acute) exacerbation: Secondary | ICD-10-CM

## 2018-02-11 DIAGNOSIS — J9621 Acute and chronic respiratory failure with hypoxia: Secondary | ICD-10-CM | POA: Diagnosis not present

## 2018-02-11 MED ORDER — PREDNISONE 10 MG (21) PO TBPK: ORAL_TABLET | ORAL | 0 refills | Status: DC

## 2018-02-11 NOTE — Patient Instructions (Signed)
Will give 6 day course of prednisone.  Call back if not feeling better in next 4-5 days.

## 2018-02-11 NOTE — Progress Notes (Signed)
[place] [place] [facility] Consultation      Assessment and Plan:  The patient is a 82 year old female with a history of invasive bladder cancer, emphysema, lung nodule, being followed up for COPD and lung nodule.   Lung Nodule. -CT chest in April 2016 Showed 0.7 cmm lung nodule in superior segment of Rt lower lobe  -Repeat scan at [facility] continues to show nodules, on 07/16/16, stable from 07/11/15 --She is going to continue to follow up at the cancer center at [facility] for her CT scans.   Acute exacerbation of COPD with acute bronchitis. Emphysema/COPD-- Group D with frequent exacerbation.  --Doubt bacterial infection, will call in prescription for prednisone taper.  Asked to call us back if she is not improved in the next few days. -Chronic severe emphysema. -Continue advair, theophylline, azithromycin, Combivent, and prn xopenex nebs. -CBC 10/06/15; absolute eosinophil count 0.5-- continue advair.   Dyspnea. --She is already maximally treated with inhaled medications and continues to experience continued decline in her breathing.  --She has graduated from pulmonary rehab.   Chronic hypoxic respiratory failure.  -Nocturnal hypoxia, continue oxygen at 2L qhs.   Anxiety.  --She gets anxious and I think this contributes her dyspnea.   Bladder cancer. -We will need to follow her lung nodule, given risk for metastatic disease.  She continues to follow-up at [facility] for follow-up CAT scans for this.  S/p pneumovax and prevnar. Meds ordered this encounter  Medications  . predniSONE (STERAPRED UNI-PAK 21 TAB) 10 MG (21) TBPK tablet    Sig: Take as directed.    Dispense:  21 tablet    Refill:  0   Return in about 3 months (around 05/14/2018).   Date: 02/11/2018  MRN# [id number] Catherine Landry [birth year]  Referring Physician: Dr. Ginette Pitman.   [name] is a 82 y.o. old female seen in consultation for chief complaint of:    Chief Complaint  Patient presents with  . Acute  Visit    Pt states she is having increased SOB and chest congestion-non productive for the most. Pt notes she coughed green mucus up this morning. Has been using nebulizer treatments.     HPI:  The patient is a 82 year old female with a history of severely symptomatic COPD with frequent exacerbations. She is currently on Advair 250/50, theophylline daily (she went off it and felt worse), Combivent Respimat 4 times per day, azithro 3 days per week. She remains on 2L oxygen.   She returns to the clinic today as an urgent visit, she has been feeling sick for the past week, she has had sick family members at home. She has been waking up in the middle of the night with trouble breathing. She has been using her nebulizer every 6 hours and feels that it is helping.    **CT chest 07/16/16; unchanged from previous.   **PFT; 12/08/16, FVC 55%, FEV1 33%, ratio of 44%. Mild improvement with bronchodilator. DLCO of 25%. No lung volumes. Consistent with severe emphysema.  She has a history of bladder cancer, chronic kidney disease, GERD, invasive bladder cancer, and left  nephroureterectomy in 2002. For her last note with oncology in October 2016, her restaging CT was negative for recurrent or metastatic disease.  **CT chest 01/16/16: at [facility] and compared with previous from 07/11/15 Impression: 1. Several scattered reticular and nodular opacities are new from prior, favored to be infectious/inflammatory in etiology, although at least one nodule is solitary and could conceivably represent a neoplasm in the background  setting of emphysema. Recommend 3 month follow-up CT.  **CT chest in April 2016 Showed 0.7 cmm lung nodule in superior segment of Rt lower lobe  **CT chest images and report from 10/10/2015 and compared with previous from from 10/06/2015. There appeared to be a small right upper lobe nodule, as well as a small area of scarring/pneumonia which appeared to have resolved on the subsequent CT,  although the nodule is still present. CT shows significant bilateral emphysema. There is also streaky atelectasis seen in the left base.   Medication:   Reviewed.     Allergies:  Patient has no known allergies.  Review of Systems:Gen:  Denies  fever, sweats, chills HEENT: Denies blurred vision, double vision.  Cvc:  No dizziness, chest pain. Resp:   Denies cough  Gi: Denies swallowing difficulty, stomach pain. Gu:  Denies bladder incontinence, burning urine Ext:   No Joint pain, stiffness. Skin: No skin rash,  hives Endoc:  No polyuria, polydipsia. Psych: No depression, insomnia. Other:  All other systems were reviewed with the patient and were negative other that what is mentioned in the HPI.    Physical Examination:   VS: BP 114/72 (BP Location: Left Arm, Cuff Size: Normal)   Pulse 96   Ht 5\' 3"  (1.6 m)   Wt 141 lb 1.6 oz (64 kg)   SpO2 94%   BMI 24.99 kg/m   General Appearance: No distress  Neuro:without focal findings,  speech normal,  HEENT: PERRLA, EOM intact.   Pulmonary: normal breath sounds,  Decreased air entry bilaterally. No wheezing.  CardiovascularNormal S1,S2.  No m/r/g.   Abdomen: Benign, Soft, non-tender. Renal:  No costovertebral tenderness  GU:  No performed at this time. Endoc: No evident thyromegaly, no signs of acromegaly. Skin:   warm, no rashes, no ecchymosis  Extremities: normal, no cyanosis, clubbing.  Other findings:    LABORATORY PANEL:   CBC No results for input(s): WBC, HGB, HCT, PLT in the last 168 hours. ------------------------------------------------------------------------------------------------------------------  Chemistries  No results for input(s): NA, K, CL, CO2, GLUCOSE, BUN, CREATININE, CALCIUM, MG, AST, ALT, ALKPHOS, BILITOT in the last 168 hours.  Invalid input(s): GFRCGP ------------------------------------------------------------------------------------------------------------------  Cardiac Enzymes No results  for input(s): TROPONINI in the last 168 hours. ------------------------------------------------------------  RADIOLOGY:  No results found.     Thank  you for the consultation and for allowing [facility] to assist in the care of your patient. Our recommendations are noted above.  Please contact us if we can be of further service.   Marda Stalker, MD.  Board Certified in Internal Medicine, Pulmonary Medicine, [place], and Sleep Medicine.  [facility]  Patricia Pesa, M.D.  [place] Border, M.D

## 2018-02-16 ENCOUNTER — Telehealth: Payer: Self-pay | Admitting: Internal Medicine

## 2018-02-16 ENCOUNTER — Other Ambulatory Visit: Payer: Self-pay | Admitting: Internal Medicine

## 2018-02-16 MED ORDER — PREDNISONE 20 MG PO TABS: 40.0000 mg | ORAL_TABLET | Freq: Every day | ORAL | 0 refills | Status: AC

## 2018-02-16 MED ORDER — AZITHROMYCIN 250 MG PO TABS: 250.0000 mg | ORAL_TABLET | ORAL | 1 refills | Status: DC

## 2018-02-16 NOTE — Telephone Encounter (Signed)
Pt calling stating she finished her Prednisone this morning and was advised to call us to give an update She states she is still feeling sick and very much congested she is wondering if something else needs to be called in   She is also has a concern about her medication  [facility] [address] [facility]  As 1 tablet once a day but patient states on her paperwork it is to be take 1 tablet  three times a week   She would like a call back about this  Please advise

## 2018-02-16 NOTE — Telephone Encounter (Signed)
Sent in script for prednisone. azithro should be taken as per the prescription on the bottle.

## 2018-02-16 NOTE — Telephone Encounter (Signed)
Pt informed and refill on Azithro sent in.

## 2018-02-16 NOTE — Telephone Encounter (Signed)
Please advise on message below.

## 2018-03-16 ENCOUNTER — Encounter: Payer: Self-pay | Admitting: Urology

## 2018-03-16 ENCOUNTER — Ambulatory Visit (INDEPENDENT_AMBULATORY_CARE_PROVIDER_SITE_OTHER): Payer: Medicare Other | Admitting: Urology

## 2018-03-16 VITALS — BP 125/70 | HR 82 | Ht 63.0 in | Wt 144.1 lb

## 2018-03-16 DIAGNOSIS — Z8551 Personal history of malignant neoplasm of bladder: Secondary | ICD-10-CM | POA: Diagnosis not present

## 2018-03-16 NOTE — Progress Notes (Signed)
03/16/2018 10:28 AM   Catherine Landry Catherine Landry June 03, 1936 [id number]  Referring provider: Tracie Harrier, MD [address] [facility] [place], [place] [address]  Chief Complaint  Patient presents with  . Establish Care  . Bladder Cancer    HPI: Catherine Landry is an 82 year old female who I last saw in 2015 after a diagnosis of squamous cell carcinoma.  She was subsequently referred to [facility] urology and was seeing Dr. Hulen Shouts.  Brief summary of her prior urologic history as follows:  --Previous history of left nephrectomy for hematuria and nonfunctioning kidney in 2002 with path returning as incidental UC (unclear stage) and therefore had completion ureterectomy in 2002. She subsequently had recurrent UC (notes do not denote path but suggest Lg) with multiple TURBT. She was disease free for many years. --2008 pTa disease sp TURBT --Prior TURBT and [place] in 11/2008 for papillary UC, low grade. 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 Reviewed at [facility]--T2 --01/03/14 CT c/a/p: There is a spiculated right upper lobe pulmonary nodule measuring 5 mm (series 603, image 15). There is a 3 mm right upper lobe pulmonary nodule (series 603, image 19). There is a 4 mm nodule in the right upper lobe (series 603, image 38). There is a left upper lobe pulmonary nodule measuring 3 mm (series 603, image 25). There are no pleural effusions. Small stable right adrenal adenoma, no clear nodal or visceral metastases seen. No prior CT chests for comparison. --01/03/14 echo normal. --01/16/14 re-TURBT [place]): negative re-TURBT --Cr 1.5 baseline GFR 34. June 2015 - chemo/XRT Sept 2015 Bbx = negative Feb 2016 cysto neg May 2016 cysto neg August 2016 cysto neg, cyto  neg Nov 2016 cysto neg May 2017 cysto neg 07/16/16 CTU NED 07/30/16 cysto neg 01/14/17 CTU = new RML groundglass opacity, but otherwise NED 01/21/17 cysto neg  She was treated with chemotherapy/radiation for cT2 bladder cancer  Completed in June 2015.  Her surveillance cystoscopies have been negative with her last cystoscopy in April 2018. She is followed regularly by radiation oncology at [facility] and was last seen April 2019 and is followed for pulmonary nodules.  She will be seen in April 2020 with a CT of the chest/abdomen/pelvis.  She has chronic urinary incontinence which is stable.   PMH: Past Medical History:  Diagnosis Date  . Arthritis   . Asthma   . Bladder cancer ([place]) 2015  . Cancer [facility]) 2001   kidney left   . COPD (chronic obstructive pulmonary disease) ([place])   . GERD (gastroesophageal reflux disease)   . Gout   . History of bladder cancer   . History of kidney cancer   . Hypertension   . Raynaud's disease   . Renal insufficiency     Surgical History: Past Surgical History:  Procedure Laterality Date  . APPENDECTOMY    . BACK SURGERY    . CATARACT EXTRACTION    . COLONOSCOPY WITH PROPOFOL N/A 10/15/2016   Procedure: COLONOSCOPY WITH PROPOFOL;  Surgeon: Lollie Sails, MD;  Location: [facility] ENDOSCOPY;  Service: Endoscopy;  Laterality: N/A;  . ESOPHAGOGASTRODUODENOSCOPY (EGD) WITH PROPOFOL N/A 10/15/2016   Procedure: ESOPHAGOGASTRODUODENOSCOPY (EGD) WITH PROPOFOL;  Surgeon: Lollie Sails, MD;  Location: [facility] ENDOSCOPY;  Service: Endoscopy;  Laterality: N/A;  . NEPHRECTOMY      Home Medications:  Allergies as of 03/16/2018  No Known Allergies     Medication List        Accurate as of 03/16/18 10:28 AM. Always use your most recent med list.          ADVAIR DISKUS 250-50 MCG/DOSE Aepb Generic drug:  Fluticasone-Salmeterol Inhale 1 puff into the lungs 2 (two) times daily. *Rinse mouth after use*   aspirin EC 81 MG tablet Take 81 mg by mouth every  morning.   azithromycin 250 MG tablet Commonly known as:  ZITHROMAX Take 1 tablet (250 mg total) by mouth 3 (three) times a week. Monday, Wednesday, Friday   CALCIUM 1200+D3 PO Take 1 tablet by mouth every morning.   COMBIVENT RESPIMAT 20-100 MCG/ACT Aers respimat Generic drug:  Ipratropium-Albuterol Inhale 1 puff into the lungs 4 (four) times daily as needed. For COPD.   levalbuterol 0.63 MG/3ML nebulizer solution Commonly known as:  XOPENEX Take 3 mLs (0.63 mg total) by nebulization every 6 (six) hours as needed for wheezing or shortness of breath. DX: COPD DX Code: J44.9   losartan 50 MG tablet Commonly known as:  COZAAR Take 50 mg by mouth every morning.   metoprolol succinate 25 MG 24 hr tablet Commonly known as:  TOPROL-XL Take 25 mg by mouth every morning.   multivitamin with minerals Tabs tablet Take 1 tablet by mouth every morning.   omeprazole 40 MG capsule Commonly known as:  PRILOSEC Take 40 mg by mouth as needed.   polyethylene glycol powder powder Commonly known as:  GLYCOLAX/MIRALAX Take 17 g by mouth daily as needed for constipation. Mix with 4 to 8 ounces of fluid.   predniSONE 10 MG (21) Tbpk tablet Commonly known as:  STERAPRED UNI-PAK 21 TAB Take as directed.   theophylline 300 MG 24 hr capsule Commonly known as:  THEO-24 Take 1 capsule (300 mg total) by mouth every morning.       Allergies: No Known Allergies  Family History: Family History  Problem Relation Age of Onset  . Breast cancer Mother 71  . Stroke Mother   . Breast cancer Other 12  . Heart disease Father     Social History:  reports that she quit smoking about 18 years ago. She has a 60.00 pack-year smoking history. She has never used smokeless tobacco. She reports that she drinks alcohol. She reports that she does not use drugs.  ROS: UROLOGY Frequent Urination?: Yes Hard to postpone urination?: Yes Burning/pain with urination?: No Get up at night to urinate?:  No Leakage of urine?: Yes Urine stream starts and stops?: No Trouble starting stream?: No Do you have to strain to urinate?: No Blood in urine?: No Urinary tract infection?: No Sexually transmitted disease?: No Injury to kidneys or bladder?: No Painful intercourse?: No Weak stream?: No Currently pregnant?: No Vaginal bleeding?: No Last menstrual period?: Postmenopausal   Gastrointestinal Nausea?: No Vomiting?: No Indigestion/heartburn?: No Diarrhea?: No Constipation?: No  Constitutional Fever: No Night sweats?: No Weight loss?: Yes Fatigue?: Yes  Skin Skin rash/lesions?: No Itching?: No  Eyes Blurred vision?: No Double vision?: No  Ears/Nose/Throat Sore throat?: No Sinus problems?: No  Hematologic/Lymphatic Swollen glands?: No Easy bruising?: Yes  Cardiovascular Leg swelling?: No Chest pain?: No  Respiratory Cough?: Yes Shortness of breath?: Yes  Endocrine Excessive thirst?: No  Musculoskeletal Back pain?: No Joint pain?: No  Neurological Headaches?: No Dizziness?: No  Psychologic Depression?: No Anxiety?: No  Physical Exam: BP 125/70 (BP Location: Right Arm, Patient Position: Sitting, Cuff Size: Normal)   Pulse  82   Ht 5\' 3"  (1.6 m)   Wt 144 lb 1.6 oz (65.4 kg)   BMI 25.53 kg/m   Constitutional:  Alert and oriented, No acute distress. HEENT: [place] AT, moist mucus membranes.  Trachea midline, no masses. Cardiovascular: No clubbing, cyanosis, or edema. Respiratory: Normal respiratory effort, no increased work of breathing. GI: Abdomen is soft, nontender, nondistended, no abdominal masses GU: No CVA tenderness Lymph: No cervical or inguinal lymphadenopathy. Skin: No rashes, bruises or suspicious lesions. Neurologic: Grossly intact, no focal deficits, moving all 4 extremities. Psychiatric: Normal mood and affect.  Assessment & Plan:   82 year old female status post chemotherapy/radiation for cT2 muscle invasive bladder cancer.  She will  continue her radiation oncology follow-up at [facility].  She is past due for surveillance cystoscopy and will return tomorrow for same.   Abbie Sons, Page [address], [place], [place] [address] [phone]

## 2018-03-17 ENCOUNTER — Encounter: Payer: Self-pay | Admitting: Urology

## 2018-03-17 ENCOUNTER — Ambulatory Visit (INDEPENDENT_AMBULATORY_CARE_PROVIDER_SITE_OTHER): Payer: Medicare Other | Admitting: Urology

## 2018-03-17 VITALS — BP 134/71 | HR 93 | Ht 63.0 in | Wt 144.0 lb

## 2018-03-17 DIAGNOSIS — Z8551 Personal history of malignant neoplasm of bladder: Secondary | ICD-10-CM | POA: Diagnosis not present

## 2018-03-17 MED ORDER — CIPROFLOXACIN HCL 500 MG PO TABS
500.0000 mg | ORAL_TABLET | Freq: Once | ORAL | Status: AC
  Administered 2018-03-17: 500 mg via ORAL

## 2018-03-17 MED ORDER — LIDOCAINE HCL URETHRAL/MUCOSAL 2 % EX GEL
1.0000 "application " | Freq: Once | CUTANEOUS | Status: AC
  Administered 2018-03-17: 1 via URETHRAL

## 2018-03-17 NOTE — Progress Notes (Signed)
   03/17/18  CC:  Chief Complaint  Patient presents with  . Cysto    HPI: Refer to my office note of 03/16/2018  Blood pressure 134/71, pulse 93, height 5\' 3"  (1.6 m), weight 144 lb (65.3 kg). NED. A&Ox3.    Cystoscopy Procedure Note  Patient identification was confirmed, informed consent was obtained, and patient was prepped using Betadine solution.  Lidocaine jelly was administered per urethral meatus.    Preoperative abx where received prior to procedure.    Procedure: - Flexible cystoscope introduced, without any difficulty.   - Thorough search of the bladder revealed:    normal urethral meatus    Mucosal findings consistent with radiation change.    no stones    no ulcers     no tumors    no urethral polyps    no trabeculation  - Ureteral orifices were normal in position and appearance.  Post-Procedure: - Patient tolerated the procedure well  Assessment/ Plan: Follow-up cystoscopy in 1 year.   Abbie Sons, MD

## 2018-03-18 LAB — URINALYSIS, COMPLETE
Bilirubin, UA: NEGATIVE
GLUCOSE, UA: NEGATIVE
Ketones, UA: NEGATIVE
Leukocytes, UA: NEGATIVE
NITRITE UA: NEGATIVE
PH UA: 5 (ref 5.0–7.5)
RBC, UA: NEGATIVE
Specific Gravity, UA: 1.02 (ref 1.005–1.030)
UUROB: 0.2 mg/dL (ref 0.2–1.0)

## 2018-03-18 LAB — MICROSCOPIC EXAMINATION

## 2018-05-16 NOTE — Progress Notes (Signed)
[place] [facility] Consultation      Assessment and Plan:  The patient is a 82 year old female with a history of invasive bladder cancer, emphysema, lung nodule, being followed up for COPD and lung nodule.   Lung Nodule. -CT chest in April 2016 Showed 0.7 cm lung nodule in superior segment of Rt lower lobe  -Repeat scan at [place] continues to show nodules, on 07/16/16, stable from 07/11/15 --She is going to continue to follow up at the cancer center at [facility] for her CT scans.   Emphysema/COPD-- Group D with frequent exacerbation.  -Chronic severe emphysema. -Continue advair, theophylline, azithromycin, Combivent. -CBC 10/06/15; absolute eosinophil count 0.5-- continue advair.   Dyspnea. --She is already maximally treated with inhaled medications and continues to experience continued decline in her breathing.  --She has graduated from pulmonary rehab.   Chronic hypoxic respiratory failure.  -Nocturnal hypoxia, continue oxygen at 2L qhs.   Anxiety.  --She gets anxious and I think this contributes her dyspnea.   Bladder cancer. -We will need to follow her lung nodule, given risk for metastatic disease.  She continues to follow-up at [facility] for follow-up CAT scans for this.  S/p pneumovax and prevnar. No orders of the defined types were placed in this encounter.  Return in about 6 months (around 11/17/2018).   Date: 05/16/2018  MRN# [id number] Catherine Landry 16-Sep-1936  Referring Physician: Dr. Ginette Pitman.   Catherine Landry is a 82 y.o. old female seen in consultation for chief complaint of:    Chief Complaint  Patient presents with  . COPD    pt on 2 L POC she states breathing doing well. When she get sob she still gets sob with exertion.    HPI:  The patient is a 82 year old female with a history of severely symptomatic COPD with frequent exacerbations. She is currently on Advair 250/50, theophylline daily (she went off it and felt worse), Combivent Respimat 4  times per day, azithro 3 days per week. She remains on 2L oxygen.   At last visit she was having an exacerbation and was given prednisone and abx, she is now doing better. She is taking advair bid, she remains on thephylline, combivent respimat bid. She remains on azithro three per week. She is doing oxygen at 2L per day.    **CT chest 07/16/16; unchanged from previous.   **PFT; 12/08/16, FVC 55%, FEV1 33%, ratio of 44%. Mild improvement with bronchodilator. DLCO of 25%. No lung volumes. Consistent with severe emphysema.  She has a history of bladder cancer, chronic kidney disease, GERD, invasive bladder cancer, and left  nephroureterectomy in 2002. For her last note with oncology in October 2016, her restaging CT was negative for recurrent or metastatic disease.  **CT chest 01/16/16: at [facility] and compared with previous from 07/11/15 Impression: 1. Several scattered reticular and nodular opacities are new from prior, favored to be infectious/inflammatory in etiology, although at least one nodule is solitary and could conceivably represent a neoplasm in the background setting of emphysema. Recommend 3 month follow-up CT.  **CT chest in April 2016 Showed 0.7 cmm lung nodule in superior segment of Rt lower lobe  **CT chest images and report from 10/10/2015 and compared with previous from from 10/06/2015. There appeared to be a small right upper lobe nodule, as well as a small area of scarring/pneumonia which appeared to have resolved on the subsequent CT, although the nodule is still present. CT shows significant bilateral emphysema. There is also streaky atelectasis seen  in the left base.   Medication:   Reviewed.     Allergies:  Patient has no known allergies.      LABORATORY PANEL:   CBC No results for input(s): WBC, HGB, HCT, PLT in the last 168 hours. ------------------------------------------------------------------------------------------------------------------  Chemistries    No results for input(s): NA, K, CL, CO2, GLUCOSE, BUN, CREATININE, CALCIUM, MG, AST, ALT, ALKPHOS, BILITOT in the last 168 hours.  Invalid input(s): GFRCGP ------------------------------------------------------------------------------------------------------------------  Cardiac Enzymes No results for input(s): TROPONINI in the last 168 hours. ------------------------------------------------------------  RADIOLOGY:  No results found.     Thank  you for the consultation and for allowing [place] [facility] to assist in the care of your patient. Our recommendations are noted above.  Please contact us if we can be of further service.   Marda Stalker, M.D., F.C.C.P.  Board Certified in Internal Medicine, Pulmonary Medicine, [place], and Sleep Medicine.  [place] [facility] Office Number: [phone]

## 2018-05-17 ENCOUNTER — Encounter: Payer: Self-pay | Admitting: Internal Medicine

## 2018-05-17 ENCOUNTER — Ambulatory Visit (INDEPENDENT_AMBULATORY_CARE_PROVIDER_SITE_OTHER): Payer: Medicare Other | Admitting: Internal Medicine

## 2018-05-17 VITALS — BP 100/58 | HR 91 | Resp 16 | Ht 63.0 in | Wt 144.0 lb

## 2018-05-17 DIAGNOSIS — J9621 Acute and chronic respiratory failure with hypoxia: Secondary | ICD-10-CM

## 2018-05-17 DIAGNOSIS — J449 Chronic obstructive pulmonary disease, unspecified: Secondary | ICD-10-CM

## 2018-05-17 DIAGNOSIS — J441 Chronic obstructive pulmonary disease with (acute) exacerbation: Secondary | ICD-10-CM

## 2018-05-17 NOTE — Patient Instructions (Signed)
Continue your current inhaled medications.

## 2018-07-15 ENCOUNTER — Other Ambulatory Visit: Payer: Self-pay | Admitting: Internal Medicine

## 2018-08-08 ENCOUNTER — Telehealth: Payer: Self-pay | Admitting: Internal Medicine

## 2018-08-08 MED ORDER — PREDNISONE 10 MG (21) PO TBPK: ORAL_TABLET | ORAL | 0 refills | Status: DC

## 2018-08-08 NOTE — Telephone Encounter (Signed)
Can send in 6 day prednisone taper.

## 2018-08-08 NOTE — Telephone Encounter (Signed)
Per Dr. Juanell Fairly, 6 day taper of prednisone. Send to [place], patient aware with no further questions at this time.

## 2018-08-08 NOTE — Telephone Encounter (Signed)
Patient calling asking for [place] to please [facility] [address] She states she is still fighting bronchitis    Please advise

## 2018-09-27 ENCOUNTER — Other Ambulatory Visit: Payer: Self-pay | Admitting: Gastroenterology

## 2018-09-27 DIAGNOSIS — K219 Gastro-esophageal reflux disease without esophagitis: Secondary | ICD-10-CM

## 2018-09-27 DIAGNOSIS — R1310 Dysphagia, unspecified: Secondary | ICD-10-CM

## 2018-10-04 ENCOUNTER — Ambulatory Visit
Admission: RE | Admit: 2018-10-04 | Discharge: 2018-10-04 | Disposition: A | Discharge status: Home / Self Care | Payer: Medicare Other | Source: Ambulatory Visit | Attending: Gastroenterology | Admitting: Gastroenterology

## 2018-10-04 DIAGNOSIS — K219 Gastro-esophageal reflux disease without esophagitis: Secondary | ICD-10-CM | POA: Insufficient documentation

## 2018-10-04 DIAGNOSIS — R1310 Dysphagia, unspecified: Secondary | ICD-10-CM | POA: Insufficient documentation

## 2018-11-30 ENCOUNTER — Other Ambulatory Visit: Payer: Self-pay | Admitting: Internal Medicine

## 2018-12-21 HISTORY — PX: EYE SURGERY: SHX253

## 2019-01-09 ENCOUNTER — Other Ambulatory Visit: Payer: Self-pay | Admitting: Internal Medicine

## 2019-01-09 DIAGNOSIS — Z1231 Encounter for screening mammogram for malignant neoplasm of breast: Secondary | ICD-10-CM

## 2019-01-12 ENCOUNTER — Other Ambulatory Visit: Payer: Self-pay | Admitting: Internal Medicine

## 2019-01-12 MED ORDER — LEVALBUTEROL HCL 0.63 MG/3ML IN NEBU: 0.6300 mg | INHALATION_SOLUTION | Freq: Four times a day (QID) | RESPIRATORY_TRACT | 5 refills | Status: DC | PRN

## 2019-01-25 ENCOUNTER — Other Ambulatory Visit: Payer: Self-pay | Admitting: Internal Medicine

## 2019-03-15 ENCOUNTER — Ambulatory Visit: Payer: Medicare Other | Admitting: Urology

## 2019-03-29 ENCOUNTER — Other Ambulatory Visit: Payer: Self-pay

## 2019-03-29 ENCOUNTER — Ambulatory Visit
Admission: RE | Admit: 2019-03-29 | Discharge: 2019-03-29 | Disposition: A | Discharge status: Home / Self Care | Payer: Medicare Other | Source: Ambulatory Visit | Attending: Internal Medicine | Admitting: Internal Medicine

## 2019-03-29 DIAGNOSIS — Z1231 Encounter for screening mammogram for malignant neoplasm of breast: Secondary | ICD-10-CM

## 2019-04-26 ENCOUNTER — Other Ambulatory Visit: Payer: Self-pay

## 2019-04-26 ENCOUNTER — Inpatient Hospital Stay
Admission: EM | Admit: 2019-04-26 | Discharge: 2019-04-30 | DRG: 189 | Disposition: A | Discharge status: Home Health | Payer: Medicare Other | Attending: Specialist | Admitting: Specialist

## 2019-04-26 ENCOUNTER — Inpatient Hospital Stay
Admit: 2019-04-26 | Discharge: 2019-04-26 | Disposition: A | Discharge status: Home / Self Care | Payer: Medicare Other | Attending: Nurse Practitioner | Admitting: Nurse Practitioner

## 2019-04-26 ENCOUNTER — Emergency Department: Payer: Medicare Other

## 2019-04-26 DIAGNOSIS — J9601 Acute respiratory failure with hypoxia: Secondary | ICD-10-CM

## 2019-04-26 DIAGNOSIS — Z9981 Dependence on supplemental oxygen: Secondary | ICD-10-CM | POA: Diagnosis not present

## 2019-04-26 DIAGNOSIS — N170 Acute kidney failure with tubular necrosis: Secondary | ICD-10-CM | POA: Diagnosis present

## 2019-04-26 DIAGNOSIS — Z823 Family history of stroke: Secondary | ICD-10-CM | POA: Diagnosis not present

## 2019-04-26 DIAGNOSIS — Z905 Acquired absence of kidney: Secondary | ICD-10-CM | POA: Diagnosis not present

## 2019-04-26 DIAGNOSIS — Z85528 Personal history of other malignant neoplasm of kidney: Secondary | ICD-10-CM

## 2019-04-26 DIAGNOSIS — Z8249 Family history of ischemic heart disease and other diseases of the circulatory system: Secondary | ICD-10-CM

## 2019-04-26 DIAGNOSIS — R778 Other specified abnormalities of plasma proteins: Secondary | ICD-10-CM

## 2019-04-26 DIAGNOSIS — I7300 Raynaud's syndrome without gangrene: Secondary | ICD-10-CM | POA: Diagnosis present

## 2019-04-26 DIAGNOSIS — I272 Pulmonary hypertension, unspecified: Secondary | ICD-10-CM | POA: Diagnosis present

## 2019-04-26 DIAGNOSIS — M109 Gout, unspecified: Secondary | ICD-10-CM | POA: Diagnosis present

## 2019-04-26 DIAGNOSIS — J441 Chronic obstructive pulmonary disease with (acute) exacerbation: Secondary | ICD-10-CM

## 2019-04-26 DIAGNOSIS — Z803 Family history of malignant neoplasm of breast: Secondary | ICD-10-CM

## 2019-04-26 DIAGNOSIS — N390 Urinary tract infection, site not specified: Secondary | ICD-10-CM | POA: Diagnosis present

## 2019-04-26 DIAGNOSIS — Z87891 Personal history of nicotine dependence: Secondary | ICD-10-CM

## 2019-04-26 DIAGNOSIS — M1990 Unspecified osteoarthritis, unspecified site: Secondary | ICD-10-CM | POA: Diagnosis present

## 2019-04-26 DIAGNOSIS — Z79899 Other long term (current) drug therapy: Secondary | ICD-10-CM

## 2019-04-26 DIAGNOSIS — I5033 Acute on chronic diastolic (congestive) heart failure: Secondary | ICD-10-CM | POA: Diagnosis not present

## 2019-04-26 DIAGNOSIS — Z8551 Personal history of malignant neoplasm of bladder: Secondary | ICD-10-CM

## 2019-04-26 DIAGNOSIS — M940 Chondrocostal junction syndrome [Tietze]: Secondary | ICD-10-CM | POA: Diagnosis present

## 2019-04-26 DIAGNOSIS — Z7982 Long term (current) use of aspirin: Secondary | ICD-10-CM | POA: Diagnosis not present

## 2019-04-26 DIAGNOSIS — J9600 Acute respiratory failure, unspecified whether with hypoxia or hypercapnia: Secondary | ICD-10-CM | POA: Diagnosis present

## 2019-04-26 DIAGNOSIS — I130 Hypertensive heart and chronic kidney disease with heart failure and stage 1 through stage 4 chronic kidney disease, or unspecified chronic kidney disease: Secondary | ICD-10-CM | POA: Diagnosis present

## 2019-04-26 DIAGNOSIS — I248 Other forms of acute ischemic heart disease: Secondary | ICD-10-CM | POA: Diagnosis present

## 2019-04-26 DIAGNOSIS — K219 Gastro-esophageal reflux disease without esophagitis: Secondary | ICD-10-CM | POA: Diagnosis present

## 2019-04-26 DIAGNOSIS — Z20828 Contact with and (suspected) exposure to other viral communicable diseases: Secondary | ICD-10-CM | POA: Diagnosis present

## 2019-04-26 DIAGNOSIS — J9621 Acute and chronic respiratory failure with hypoxia: Secondary | ICD-10-CM | POA: Diagnosis present

## 2019-04-26 DIAGNOSIS — E860 Dehydration: Secondary | ICD-10-CM | POA: Diagnosis present

## 2019-04-26 DIAGNOSIS — N189 Chronic kidney disease, unspecified: Secondary | ICD-10-CM | POA: Diagnosis present

## 2019-04-26 DIAGNOSIS — R7989 Other specified abnormal findings of blood chemistry: Secondary | ICD-10-CM | POA: Diagnosis present

## 2019-04-26 DIAGNOSIS — F419 Anxiety disorder, unspecified: Secondary | ICD-10-CM | POA: Diagnosis present

## 2019-04-26 DIAGNOSIS — Z792 Long term (current) use of antibiotics: Secondary | ICD-10-CM | POA: Diagnosis not present

## 2019-04-26 DIAGNOSIS — R079 Chest pain, unspecified: Secondary | ICD-10-CM

## 2019-04-26 DIAGNOSIS — R350 Frequency of micturition: Secondary | ICD-10-CM | POA: Diagnosis present

## 2019-04-26 LAB — CBC WITH DIFFERENTIAL/PLATELET
Abs Immature Granulocytes: 0.02 10*3/uL (ref 0.00–0.07)
Basophils Absolute: 0 10*3/uL (ref 0.0–0.1)
Basophils Relative: 0 %
Eosinophils Absolute: 0 10*3/uL (ref 0.0–0.5)
Eosinophils Relative: 0 %
HCT: 36.2 % (ref 36.0–46.0)
Hemoglobin: 11.5 g/dL — ABNORMAL LOW (ref 12.0–15.0)
Immature Granulocytes: 0 %
Lymphocytes Relative: 9 %
Lymphs Abs: 0.4 10*3/uL — ABNORMAL LOW (ref 0.7–4.0)
MCH: 31.9 pg (ref 26.0–34.0)
MCHC: 31.8 g/dL (ref 30.0–36.0)
MCV: 100.3 fL — ABNORMAL HIGH (ref 80.0–100.0)
Monocytes Absolute: 0.1 10*3/uL (ref 0.1–1.0)
Monocytes Relative: 3 %
Neutro Abs: 4.5 10*3/uL (ref 1.7–7.7)
Neutrophils Relative %: 88 %
Platelets: 201 10*3/uL (ref 150–400)
RBC: 3.61 MIL/uL — ABNORMAL LOW (ref 3.87–5.11)
RDW: 13.5 % (ref 11.5–15.5)
WBC: 5.1 10*3/uL (ref 4.0–10.5)
nRBC: 0 % (ref 0.0–0.2)

## 2019-04-26 LAB — BLOOD GAS, ARTERIAL
Acid-Base Excess: 0.1 mmol/L (ref 0.0–2.0)
Bicarbonate: 28.2 mmol/L — ABNORMAL HIGH (ref 20.0–28.0)
Delivery systems: POSITIVE
Expiratory PAP: 8
FIO2: 0.35
Inspiratory PAP: 14
O2 Saturation: 93.9 %
Patient temperature: 37
RATE: 12 resp/min
pCO2 arterial: 60 mmHg — ABNORMAL HIGH (ref 32.0–48.0)
pH, Arterial: 7.28 — ABNORMAL LOW (ref 7.350–7.450)
pO2, Arterial: 79 mmHg — ABNORMAL LOW (ref 83.0–108.0)

## 2019-04-26 LAB — COMPREHENSIVE METABOLIC PANEL
ALT: 16 U/L (ref 0–44)
AST: 28 U/L (ref 15–41)
Albumin: 4.1 g/dL (ref 3.5–5.0)
Alkaline Phosphatase: 51 U/L (ref 38–126)
Anion gap: 8 (ref 5–15)
BUN: 22 mg/dL (ref 8–23)
CO2: 26 mmol/L (ref 22–32)
Calcium: 8.1 mg/dL — ABNORMAL LOW (ref 8.9–10.3)
Chloride: 102 mmol/L (ref 98–111)
Creatinine, Ser: 1.17 mg/dL — ABNORMAL HIGH (ref 0.44–1.00)
GFR calc Af Amer: 51 mL/min — ABNORMAL LOW (ref >60)
GFR calc non Af Amer: 44 mL/min — ABNORMAL LOW (ref >60)
Glucose, Bld: 183 mg/dL — ABNORMAL HIGH (ref 70–99)
Potassium: 5.2 mmol/L — ABNORMAL HIGH (ref 3.5–5.1)
Sodium: 136 mmol/L (ref 135–145)
Total Bilirubin: 0.7 mg/dL (ref 0.3–1.2)
Total Protein: 7 g/dL (ref 6.5–8.1)

## 2019-04-26 LAB — URINALYSIS, COMPLETE (UACMP) WITH MICROSCOPIC
Bacteria, UA: NONE SEEN
Bilirubin Urine: NEGATIVE
Glucose, UA: NEGATIVE mg/dL
Hgb urine dipstick: NEGATIVE
Ketones, ur: 5 mg/dL — AB
Nitrite: NEGATIVE
Protein, ur: 100 mg/dL — AB
Specific Gravity, Urine: 1.016 (ref 1.005–1.030)
WBC, UA: >50 WBC/hpf — ABNORMAL HIGH (ref 0–5)
pH: 5 (ref 5.0–8.0)

## 2019-04-26 LAB — LACTIC ACID, PLASMA
Lactic Acid, Venous: 1 mmol/L (ref 0.5–1.9)
Lactic Acid, Venous: 1.6 mmol/L (ref 0.5–1.9)

## 2019-04-26 LAB — ECHOCARDIOGRAM COMPLETE
Height: 62 in
Weight: 2128 oz

## 2019-04-26 LAB — TSH: TSH: 1.131 u[IU]/mL (ref 0.350–4.500)

## 2019-04-26 LAB — SARS CORONAVIRUS 2 BY RT PCR (HOSPITAL ORDER, PERFORMED IN ~~LOC~~ HOSPITAL LAB): SARS Coronavirus 2: NEGATIVE

## 2019-04-26 LAB — TROPONIN I (HIGH SENSITIVITY)
Troponin I (High Sensitivity): 264 ng/L (ref <18)
Troponin I (High Sensitivity): 363 ng/L (ref <18)
Troponin I (High Sensitivity): 466 ng/L (ref <18)
Troponin I (High Sensitivity): 593 ng/L (ref <18)

## 2019-04-26 LAB — BRAIN NATRIURETIC PEPTIDE: B Natriuretic Peptide: 265 pg/mL — ABNORMAL HIGH (ref 0.0–100.0)

## 2019-04-26 LAB — MRSA PCR SCREENING: MRSA by PCR: NEGATIVE

## 2019-04-26 LAB — GLUCOSE, CAPILLARY: Glucose-Capillary: 158 mg/dL — ABNORMAL HIGH (ref 70–99)

## 2019-04-26 MED ORDER — IPRATROPIUM-ALBUTEROL 0.5-2.5 (3) MG/3ML IN SOLN
3.0000 mL | Freq: Once | RESPIRATORY_TRACT | Status: AC
  Administered 2019-04-26: 3 mL via RESPIRATORY_TRACT
  Filled 2019-04-26: qty 3

## 2019-04-26 MED ORDER — SODIUM CHLORIDE 0.9% FLUSH
3.0000 mL | Freq: Two times a day (BID) | INTRAVENOUS | Status: DC
  Administered 2019-04-26 – 2019-04-30 (×9): 3 mL via INTRAVENOUS

## 2019-04-26 MED ORDER — ONDANSETRON HCL 4 MG/2ML IJ SOLN: 4.0000 mg | Freq: Four times a day (QID) | INTRAMUSCULAR | Status: DC | PRN

## 2019-04-26 MED ORDER — ORAL CARE MOUTH RINSE
15.0000 mL | Freq: Two times a day (BID) | OROMUCOSAL | Status: DC
  Administered 2019-04-26 (×2): 15 mL via OROMUCOSAL

## 2019-04-26 MED ORDER — METOPROLOL SUCCINATE ER 25 MG PO TB24
25.0000 mg | ORAL_TABLET | Freq: Every morning | ORAL | Status: DC
  Administered 2019-04-26 – 2019-04-30 (×5): 25 mg via ORAL
  Filled 2019-04-26 (×5): qty 1

## 2019-04-26 MED ORDER — MOMETASONE FURO-FORMOTEROL FUM 200-5 MCG/ACT IN AERO
2.0000 | INHALATION_SPRAY | Freq: Two times a day (BID) | RESPIRATORY_TRACT | Status: DC
  Administered 2019-04-26 – 2019-04-29 (×7): 2 via RESPIRATORY_TRACT
  Filled 2019-04-26: qty 8.8

## 2019-04-26 MED ORDER — LEVOFLOXACIN IN D5W 750 MG/150ML IV SOLN
750.0000 mg | INTRAVENOUS | Status: DC
  Filled 2019-04-26: qty 150

## 2019-04-26 MED ORDER — CHLORHEXIDINE GLUCONATE 0.12 % MT SOLN
15.0000 mL | Freq: Two times a day (BID) | OROMUCOSAL | Status: DC
  Administered 2019-04-26 – 2019-04-27 (×4): 15 mL via OROMUCOSAL
  Filled 2019-04-26 (×3): qty 15

## 2019-04-26 MED ORDER — VITAMIN B-12 1000 MCG PO TABS
3000.0000 ug | ORAL_TABLET | Freq: Every day | ORAL | Status: DC
  Administered 2019-04-26 – 2019-04-30 (×5): 3000 ug via ORAL
  Filled 2019-04-26 (×5): qty 3

## 2019-04-26 MED ORDER — SODIUM CHLORIDE 0.9 % IV SOLN: 250.0000 mL | INTRAVENOUS | Status: DC | PRN

## 2019-04-26 MED ORDER — FLUTICASONE PROPIONATE 50 MCG/ACT NA SUSP
2.0000 | Freq: Every day | NASAL | Status: DC
  Administered 2019-04-26 – 2019-04-30 (×5): 2 via NASAL
  Filled 2019-04-26: qty 16

## 2019-04-26 MED ORDER — LORAZEPAM 2 MG/ML IJ SOLN
1.0000 mg | Freq: Once | INTRAMUSCULAR | Status: AC
  Administered 2019-04-26: 03:00:00 1 mg via INTRAVENOUS

## 2019-04-26 MED ORDER — VITAMIN C 500 MG PO TABS
500.0000 mg | ORAL_TABLET | Freq: Every day | ORAL | Status: DC
  Administered 2019-04-26 – 2019-04-30 (×5): 500 mg via ORAL
  Filled 2019-04-26 (×5): qty 1

## 2019-04-26 MED ORDER — LOSARTAN POTASSIUM 50 MG PO TABS: 50.0000 mg | ORAL_TABLET | Freq: Every morning | ORAL | Status: DC

## 2019-04-26 MED ORDER — ASPIRIN EC 81 MG PO TBEC: 81.0000 mg | DELAYED_RELEASE_TABLET | Freq: Every day | ORAL | Status: DC

## 2019-04-26 MED ORDER — IPRATROPIUM-ALBUTEROL 0.5-2.5 (3) MG/3ML IN SOLN: 3.0000 mL | Freq: Four times a day (QID) | RESPIRATORY_TRACT | Status: DC | PRN

## 2019-04-26 MED ORDER — LORAZEPAM 2 MG/ML IJ SOLN
INTRAMUSCULAR | Status: AC
  Administered 2019-04-26: 1 mg via INTRAVENOUS
  Filled 2019-04-26: qty 1

## 2019-04-26 MED ORDER — ORAL CARE MOUTH RINSE: 15.0000 mL | Freq: Two times a day (BID) | OROMUCOSAL | Status: DC

## 2019-04-26 MED ORDER — THEOPHYLLINE ER 200 MG PO CP24
300.0000 mg | ORAL_CAPSULE | Freq: Every morning | ORAL | Status: DC
  Administered 2019-04-26 – 2019-04-30 (×5): 300 mg via ORAL
  Filled 2019-04-26 (×5): qty 1

## 2019-04-26 MED ORDER — MORPHINE SULFATE (PF) 2 MG/ML IV SOLN
1.0000 mg | INTRAVENOUS | Status: DC | PRN
  Administered 2019-04-26 – 2019-04-28 (×2): 2 mg via INTRAVENOUS
  Filled 2019-04-26 (×2): qty 1

## 2019-04-26 MED ORDER — ENOXAPARIN SODIUM 40 MG/0.4ML ~~LOC~~ SOLN
40.0000 mg | SUBCUTANEOUS | Status: DC
  Administered 2019-04-26: 40 mg via SUBCUTANEOUS
  Filled 2019-04-26: qty 0.4

## 2019-04-26 MED ORDER — ASPIRIN EC 81 MG PO TBEC
81.0000 mg | DELAYED_RELEASE_TABLET | Freq: Every morning | ORAL | Status: DC
  Administered 2019-04-26 – 2019-04-30 (×5): 81 mg via ORAL
  Filled 2019-04-26 (×5): qty 1

## 2019-04-26 MED ORDER — METHYLPREDNISOLONE SODIUM SUCC 125 MG IJ SOLR
60.0000 mg | Freq: Four times a day (QID) | INTRAMUSCULAR | Status: DC
  Administered 2019-04-26: 60 mg via INTRAVENOUS
  Filled 2019-04-26: qty 2

## 2019-04-26 MED ORDER — ACETAMINOPHEN 650 MG RE SUPP: 650.0000 mg | Freq: Four times a day (QID) | RECTAL | Status: DC | PRN

## 2019-04-26 MED ORDER — METHYLPREDNISOLONE SODIUM SUCC 40 MG IJ SOLR
40.0000 mg | Freq: Two times a day (BID) | INTRAMUSCULAR | Status: DC
  Administered 2019-04-26 – 2019-04-30 (×9): 40 mg via INTRAVENOUS
  Filled 2019-04-26 (×9): qty 1

## 2019-04-26 MED ORDER — SODIUM CHLORIDE 0.9 % IV SOLN: 1.0000 g | INTRAVENOUS | Status: DC

## 2019-04-26 MED ORDER — ASPIRIN 81 MG PO CHEW
324.0000 mg | CHEWABLE_TABLET | Freq: Once | ORAL | Status: AC
  Administered 2019-04-26: 324 mg via ORAL

## 2019-04-26 MED ORDER — CHLORHEXIDINE GLUCONATE 0.12 % MT SOLN: 15.0000 mL | Freq: Two times a day (BID) | OROMUCOSAL | Status: DC

## 2019-04-26 MED ORDER — PANTOPRAZOLE SODIUM 40 MG PO TBEC
40.0000 mg | DELAYED_RELEASE_TABLET | Freq: Two times a day (BID) | ORAL | Status: DC
  Administered 2019-04-26 – 2019-04-30 (×9): 40 mg via ORAL
  Filled 2019-04-26 (×9): qty 1

## 2019-04-26 MED ORDER — CHLORHEXIDINE GLUCONATE CLOTH 2 % EX PADS
6.0000 | MEDICATED_PAD | Freq: Every day | CUTANEOUS | Status: DC
  Administered 2019-04-26 – 2019-04-27 (×2): 6 via TOPICAL
  Filled 2019-04-26: qty 6

## 2019-04-26 MED ORDER — ONDANSETRON HCL 4 MG PO TABS: 4.0000 mg | ORAL_TABLET | Freq: Four times a day (QID) | ORAL | Status: DC | PRN

## 2019-04-26 MED ORDER — SODIUM CHLORIDE 0.9% FLUSH
3.0000 mL | INTRAVENOUS | Status: DC | PRN
  Administered 2019-04-28: 3 mL via INTRAVENOUS
  Filled 2019-04-26: qty 3

## 2019-04-26 MED ORDER — SODIUM CHLORIDE 0.9 % IV SOLN
1.0000 g | INTRAVENOUS | Status: AC
  Administered 2019-04-26 – 2019-04-28 (×3): 1 g via INTRAVENOUS
  Filled 2019-04-26: qty 1
  Filled 2019-04-26: qty 10
  Filled 2019-04-26: qty 1

## 2019-04-26 MED ORDER — IPRATROPIUM-ALBUTEROL 0.5-2.5 (3) MG/3ML IN SOLN
3.0000 mL | Freq: Four times a day (QID) | RESPIRATORY_TRACT | Status: DC
  Administered 2019-04-26 – 2019-04-30 (×17): 3 mL via RESPIRATORY_TRACT
  Filled 2019-04-26 (×18): qty 3

## 2019-04-26 MED ORDER — BUDESONIDE 0.25 MG/2ML IN SUSP
0.2500 mg | Freq: Two times a day (BID) | RESPIRATORY_TRACT | Status: DC
  Administered 2019-04-26 – 2019-04-30 (×9): 0.25 mg via RESPIRATORY_TRACT
  Filled 2019-04-26 (×9): qty 2

## 2019-04-26 MED ORDER — IPRATROPIUM-ALBUTEROL 0.5-2.5 (3) MG/3ML IN SOLN
3.0000 mL | RESPIRATORY_TRACT | Status: DC | PRN
  Administered 2019-04-27: 3 mL via RESPIRATORY_TRACT
  Filled 2019-04-26 (×2): qty 3

## 2019-04-26 MED ORDER — SODIUM CHLORIDE 0.9% FLUSH: 3.0000 mL | Freq: Two times a day (BID) | INTRAVENOUS | Status: DC

## 2019-04-26 MED ORDER — METHYLPREDNISOLONE SODIUM SUCC 125 MG IJ SOLR
125.0000 mg | Freq: Once | INTRAMUSCULAR | Status: AC
  Administered 2019-04-26: 125 mg via INTRAVENOUS
  Filled 2019-04-26: qty 2

## 2019-04-26 MED ORDER — FUROSEMIDE 10 MG/ML IJ SOLN
20.0000 mg | Freq: Once | INTRAMUSCULAR | Status: AC
  Administered 2019-04-26: 20 mg via INTRAVENOUS
  Filled 2019-04-26: qty 2

## 2019-04-26 MED ORDER — FERROUS SULFATE 325 (65 FE) MG PO TABS
325.0000 mg | ORAL_TABLET | Freq: Every day | ORAL | Status: DC
  Administered 2019-04-28 – 2019-04-30 (×3): 325 mg via ORAL
  Filled 2019-04-26 (×4): qty 1

## 2019-04-26 MED ORDER — ACETAMINOPHEN 325 MG PO TABS: 650.0000 mg | ORAL_TABLET | Freq: Four times a day (QID) | ORAL | Status: DC | PRN

## 2019-04-26 MED ORDER — PANTOPRAZOLE SODIUM 40 MG PO TBEC: 40.0000 mg | DELAYED_RELEASE_TABLET | Freq: Every day | ORAL | Status: DC

## 2019-04-26 MED ORDER — POLYSACCHARIDE IRON COMPLEX 150 MG PO CAPS
150.0000 mg | ORAL_CAPSULE | Freq: Every day | ORAL | Status: DC
  Administered 2019-04-28 – 2019-04-30 (×3): 150 mg via ORAL
  Filled 2019-04-26 (×5): qty 1

## 2019-04-26 MED ORDER — POLYETHYLENE GLYCOL 3350 17 G PO PACK: 17.0000 g | PACK | Freq: Every day | ORAL | Status: DC | PRN

## 2019-04-26 MED ORDER — PHENOL 1.4 % MT LIQD
1.0000 | OROMUCOSAL | Status: DC | PRN
  Administered 2019-04-26 – 2019-04-30 (×2): 1 via OROMUCOSAL
  Filled 2019-04-26: qty 177

## 2019-04-26 MED ORDER — PREDNISONE 20 MG PO TABS: 40.0000 mg | ORAL_TABLET | Freq: Every day | ORAL | Status: DC

## 2019-04-26 NOTE — Progress Notes (Signed)
OT Cancellation Note  Patient Details Name: Catherine Landry MRN: [id number] DOB: 21-Nov-1935   Cancelled Evaluation:    Reason Eval/Treat Not Completed: Patient at procedure or test/ unavailable. Thank you for the OT consult. Order received and chart reviewed. Upon arrival to pt room, pt with physical therapist in room for session. Will re-attempt at a later time/date as available and pt medically appropriate for OT evaluation.   Shara Blazing, M.S., OTR/L Ascom: [phone] 04/26/19, 11:12 AM   Brelee Renk Roosvelt Maser 04/26/2019, 11:11 AM

## 2019-04-26 NOTE — Progress Notes (Signed)
eLink Physician-Brief Progress Note Patient Name: Catherine Landry DOB: 11-28-1935 MRN: [id number]   Date of Service  04/26/2019  HPI/Events of Note  35 F history of COPD presented with shortness of breath now on BiPap and transferred to ICU.  She also has symptoms of dysuria consistent with UTI on UA. CXR no infiltrates.  eICU Interventions   Continue BiPap, bronchodilators and systemic steroids  On Levofloxacin for COPD bronchitis as well as UTI     Intervention Category Major Interventions: Respiratory failure - evaluation and management Intermediate Interventions: Other: Evaluation Type: New Patient Evaluation  Judd Lien 04/26/2019, 6:29 AM

## 2019-04-26 NOTE — ED Notes (Signed)
Respirator placed patient back on Bipap. Patient's breathing immediately became less labored and patient's O2 saturation returned to 90's.

## 2019-04-26 NOTE — H&P (Signed)
[facility] [place] NAME: Catherine Landry    MR#:  [id number]  DATE OF BIRTH:  1936/03/09  DATE OF ADMISSION:  04/26/2019  PRIMARY CARE PHYSICIAN: Tracie Harrier, MD   REQUESTING/REFERRING PHYSICIAN: Arta Silence, MD  CHIEF COMPLAINT:   Chief Complaint  Patient presents with  . Shortness of Breath    HISTORY OF PRESENT ILLNESS:  Catherine Landry  is a 83 y.o. female with a known history of COPD, GERD, hypertension, ray nods disease, renal insufficiency, and a history of left kidney cancer.  She presented to the emergency room reporting a 2-week history of increased shortness of breath having become worse over the last 24 hours.  Shortness of breath is no longer relieved with oxygen and albuterol treatments at home.  She still complained of midsternal chest pain described as heaviness with a pain score 8-10 out of 10 on arrival to the emergency room.  However while I am seeing her, after BiPAP has been placed, she is no longer complaining of chest pain.  Chest pain was described as nonradiating with no associated nausea or diaphoresis.  She reports having subjective fevers and chills at home as well as productive cough with yellow mucus.  On arrival to the emergency room, oxygen saturations decreased to the low 80% and therefore patient was placed on BiPAP therapy.  Current oxygen saturation is 98 to 99% on BiPAP.  Chest x-ray demonstrated no acute pulmonary disease.  Troponin is 264-high-sensitivity.  Potassium is 5.2 with BUN 22 and creatinine 1.17.  Urinalysis demonstrates moderate leukocytes and greater than 50 WBCs.  Patient admits to dysuria over the last 3 days with urine frequency and urgency.  He has not noted a foul urine odor or dark urine.  She denies hematuria.  We have admitted her to the hospitalist service planning transfer of care to the ICU intensivist.  PAST MEDICAL HISTORY:   Past Medical History:  Diagnosis Date  .  Arthritis   . Asthma   . Bladder cancer ([place]) 2015  . Cancer [facility]) 2001   kidney left   . COPD (chronic obstructive pulmonary disease) ([place])   . GERD (gastroesophageal reflux disease)   . Gout   . History of bladder cancer   . History of kidney cancer   . Hypertension   . Raynaud's disease   . Renal insufficiency     PAST SURGICAL HISTORY:   Past Surgical History:  Procedure Laterality Date  . APPENDECTOMY    . BACK SURGERY    . CATARACT EXTRACTION    . COLONOSCOPY WITH PROPOFOL N/A 10/15/2016   Procedure: COLONOSCOPY WITH PROPOFOL;  Surgeon: Lollie Sails, MD;  Location[facility] ENDOSCOPY;  Service: Endoscopy;  Laterality: N/A;  . ESOPHAGOGASTRODUODENOSCOPY (EGD) WITH PROPOFOL N/A 10/15/2016   Procedure: ESOPHAGOGASTRODUODENOSCOPY (EGD) WITH PROPOFOL;  Surgeon: Lollie Sails, MD;  Location: [facility] ENDOSCOPY;  Service: Endoscopy;  Laterality: N/A;  . EYE SURGERY Left 12/2018  . NEPHRECTOMY      SOCIAL HISTORY:   Social History   Tobacco Use  . Smoking status: Former Smoker    Packs/day: 1.50    Years: 40.00    Pack years: 60.00    Quit date: 12/09/1999    Years since quitting: 19.3  . Smokeless tobacco: Never Used  Substance Use Topics  . Alcohol use: Yes    Comment: Socally     FAMILY HISTORY:   Family History  Problem Relation Age of Onset  .  Breast cancer Mother 32  . Stroke Mother   . Breast cancer Other 40  . Heart disease Father     DRUG ALLERGIES:  No Known Allergies  REVIEW OF SYSTEMS:   Review of Systems  Constitutional: Positive for chills, fever and malaise/fatigue. Negative for diaphoresis.  HENT: Positive for congestion. Negative for sinus pain and sore throat.   Eyes: Negative for blurred vision and double vision.  Respiratory: Positive for cough, sputum production, shortness of breath and wheezing. Negative for hemoptysis.   Cardiovascular: Positive for chest pain. Negative for palpitations and leg swelling.  Gastrointestinal:  Negative for abdominal pain, blood in stool, constipation, diarrhea, heartburn, nausea and vomiting.  Genitourinary: Negative for dysuria, flank pain and hematuria.  Musculoskeletal: Negative for myalgias.  Skin: Negative for rash.  Neurological: Negative for dizziness, weakness and headaches.  Psychiatric/Behavioral: Negative for depression.      MEDICATIONS AT HOME:   Prior to Admission medications   Medication Sig Start Date End Date Taking? Authorizing Provider  ADVAIR DISKUS 250-50 MCG/DOSE AEPB Inhale 1 puff into the lungs 2 (two) times daily. *Rinse mouth after use* 09/18/15   [provider]  aspirin EC 81 MG tablet Take 81 mg by mouth every morning.    [provider]  azithromycin (ZITHROMAX) 250 MG tablet TAKE 1 TABLET (250 MG TOTAL) BY MOUTH 3 (THREE) TIMES A WEEK. Laurine Blazer, [place] 11/30/18   Laverle Hobby, MD  Calcium-Magnesium-Vitamin D (CALCIUM 1200+D3 PO) Take 1 tablet by mouth every morning.    [provider]  COMBIVENT RESPIMAT 20-100 MCG/ACT AERS respimat Inhale 1 puff into the lungs 4 (four) times daily as needed. For COPD. 09/16/15   [provider]  ferrous sulfate 325 (65 FE) MG tablet Take 325 mg by mouth daily with breakfast.    [provider]  levalbuterol (XOPENEX) 0.63 MG/3ML nebulizer solution Take 3 mLs (0.63 mg total) by nebulization every 6 (six) hours as needed for wheezing or shortness of breath. DX: COPD DX Code: J44.9 01/12/19 01/12/20  Laverle Hobby, MD  losartan (COZAAR) 50 MG tablet Take 50 mg by mouth every morning.    [provider]  metoprolol succinate (TOPROL-XL) 25 MG 24 hr tablet Take 25 mg by mouth every morning.    [provider]  Multiple Vitamin (MULTIVITAMIN WITH MINERALS) TABS Take 1 tablet by mouth every morning.    [provider]  omeprazole (PRILOSEC) 40 MG capsule Take 40 mg by mouth as needed. 02/01/17   [provider]   polyethylene glycol powder (GLYCOLAX/MIRALAX) powder Take 17 g by mouth daily as needed for constipation. Mix with 4 to 8 ounces of fluid. 04/10/16   [provider]  predniSONE (STERAPRED UNI-PAK 21 TAB) 10 MG (21) TBPK tablet Dispense as taper. 08/08/18   Laverle Hobby, MD  theophylline (THEO-24) 300 MG 24 hr capsule Take 1 capsule (300 mg total) by mouth every morning. 10/12/15   Bettey Costa, MD      VITAL SIGNS:  Blood pressure 132/85, pulse (!) 109, temperature 97.6 F (36.4 C), temperature source Axillary, resp. rate (!) 28, height 5\' 2"  (1.575 m), weight 60.3 kg, SpO2 98 %.  PHYSICAL EXAMINATION:  Physical Exam  GENERAL:  83 y.o.-year-old ill-appearing patient lying in the bed with no acute distress.  EYES: Pupils equal, round, reactive to light and accommodation. No scleral icterus. Extraocular muscles intact.  HEENT: Head atraumatic, normocephalic. Oropharynx and nasopharynx clear.  NECK:  Supple, no jugular venous distention. No thyroid  enlargement, no tenderness.  LUNGS: Increased work of breathing breath sounds diminished in the bases with bilateral expiratory wheezes faint.   CARDIOVASCULAR: Regular rate and rhythm, S1, S2 normal. No murmurs, rubs, or gallops.  ABDOMEN: Soft, nondistended, nontender. Bowel sounds present. No organomegaly or mass.  EXTREMITIES: No pedal edema, cyanosis, or clubbing.  NEUROLOGIC: Cranial nerves II through XII are intact. Muscle strength 5/5 in all extremities. Sensation intact. Gait not checked.  PSYCHIATRIC: The patient is alert and oriented x 3.  Normal affect and good eye contact. SKIN: No obvious rash, lesion, or ulcer.   LABORATORY PANEL:   CBC Recent Labs  Lab 04/26/19 0222  WBC 5.1  HGB 11.5*  HCT 36.2  PLT 201   ------------------------------------------------------------------------------------------------------------------  Chemistries  Recent Labs  Lab 04/26/19 0222  NA 136  K 5.2*  CL 102  CO2 26   GLUCOSE 183*  BUN 22  CREATININE 1.17*  CALCIUM 8.1*  AST 28  ALT 16  ALKPHOS 51  BILITOT 0.7   ------------------------------------------------------------------------------------------------------------------  Cardiac Enzymes No results for input(s): TROPONINI in the last 168 hours. ------------------------------------------------------------------------------------------------------------------  RADIOLOGY:  Dg Chest Portable 1 View  Result Date: 04/26/2019 CLINICAL DATA:  Shortness of breath EXAM: PORTABLE CHEST 1 VIEW COMPARISON:  11/23/2017 FINDINGS: Cardiac shadow is stable. Aortic calcifications are noted. The lungs are hyperinflated consistent with COPD. No focal infiltrate is seen. Interstitial edema is noted particularly on the right increased from the prior exam. IMPRESSION: COPD and mild interstitial edema. Electronically Signed   By: Inez Catalina M.D.   On: 04/26/2019 02:42      IMPRESSION AND PLAN:   1.  Acute respiratory failure - BiPAP therapy placed - Patient will be transferred to ICU for higher level of care and close monitoring with transfer of care to the intensivist. -ABG pending  2. acute exacerbation COPD - IV antibiotic and steroid therapy with a Medrol initiated. - DuoNebs as needed for shortness of breath and wheezing. -BiPAP therapy for acute respiratory failure  3.  Chest pain with elevated troponin - Continue to trend troponin levels - Echocardiogram and repeat EKG every 6 hours - Beta-blocker and aspirin initiated - Cardiology consulted for further evaluation and recommendations -Telemetry monitoring  4.  Urinary tract infection - IV Levaquin - Will adjust treatment based on culture results when available  DVT and PPI prophylaxis initiated Transfer of care to [facility], intensivist in the ICU    All the records are reviewed and case discussed with ED provider. The plan of care was discussed in details with the patient (and  family). I answered all questions. The patient agreed to proceed with the above mentioned plan. Further management will depend upon hospital course.   CODE STATUS: Full code  TOTAL TIME TAKING CARE OF THIS PATIENT: 45 minutes.    Theo Dills Nyemah Watton CRNPon 04/26/2019 at [place] AM  Pager - [phone]  After 6pm go to [email or web address] - Proofreader  [facility]  Office  [phone]  CC: Primary care physician; Tracie Harrier, MD   Note: This dictation was prepared with Dragon dictation along with smaller phrase technology. Any transcriptional errors that result from this process are unintentional.

## 2019-04-26 NOTE — ED Notes (Addendum)
Patient found with BiPaP removed and on room air. Patient placed on nasal cannula 4L/min.

## 2019-04-26 NOTE — ED Provider Notes (Signed)
[facility] Emergency Department Provider Note ____________________________________________   First MD Initiated Contact with Patient 04/26/19 0211     (approximate)  I have reviewed the triage vital signs and the nursing notes.   HISTORY  Chief Complaint Shortness of Breath  Level 5 caveat: History of present illness limited due to respiratory distress  HPI Catherine Landry is a 83 y.o. female with PMH as noted below who presents with shortness of breath, gradual onset over the last several days, worsening today, not relieved by oxygen and albuterol at home, and associated with some chest discomfort.  The patient states she normally uses home oxygen at 2 or 2.5 L.  Patient states that she has had some cough but denies fever.  She has no known exposure to anyone with COVID-19.  Per EMS, her O2 saturation on their arrival was in the mid to high 80s.  Past Medical History:  Diagnosis Date  . Arthritis   . Asthma   . Bladder cancer ([place]) 2015  . Cancer [facility]) 2001   kidney left   . COPD (chronic obstructive pulmonary disease) ([place])   . GERD (gastroesophageal reflux disease)   . Gout   . History of bladder cancer   . History of kidney cancer   . Hypertension   . Raynaud's disease   . Renal insufficiency     Patient Active Problem List   Diagnosis Date Noted  . Personal history of bladder cancer 03/16/2018  . Acute on chronic respiratory failure with hypoxia ([place]) 11/23/2017  . UTI (lower urinary tract infection) 04/25/2016  . COPD exacerbation ([place]) 10/06/2015    Past Surgical History:  Procedure Laterality Date  . APPENDECTOMY    . BACK SURGERY    . CATARACT EXTRACTION    . COLONOSCOPY WITH PROPOFOL N/A 10/15/2016   Procedure: COLONOSCOPY WITH PROPOFOL;  Surgeon: Lollie Sails, MD;  Location: [facility] ENDOSCOPY;  Service: Endoscopy;  Laterality: N/A;  . ESOPHAGOGASTRODUODENOSCOPY (EGD) WITH PROPOFOL N/A 10/15/2016   Procedure:  ESOPHAGOGASTRODUODENOSCOPY (EGD) WITH PROPOFOL;  Surgeon: Lollie Sails, MD;  Location: [facility] ENDOSCOPY;  Service: Endoscopy;  Laterality: N/A;  . EYE SURGERY Left 12/2018  . NEPHRECTOMY      Prior to Admission medications   Medication Sig Start Date End Date Taking? Authorizing Provider  ADVAIR DISKUS 250-50 MCG/DOSE AEPB Inhale 1 puff into the lungs 2 (two) times daily. *Rinse mouth after use* 09/18/15   [provider]  aspirin EC 81 MG tablet Take 81 mg by mouth every morning.    [provider]  azithromycin (ZITHROMAX) 250 MG tablet TAKE 1 TABLET (250 MG TOTAL) BY MOUTH 3 (THREE) TIMES A WEEK. Laurine Blazer, [place] 11/30/18   Laverle Hobby, MD  Calcium-Magnesium-Vitamin D (CALCIUM 1200+D3 PO) Take 1 tablet by mouth every morning.    [provider]  COMBIVENT RESPIMAT 20-100 MCG/ACT AERS respimat Inhale 1 puff into the lungs 4 (four) times daily as needed. For COPD. 09/16/15   [provider]  ferrous sulfate 325 (65 FE) MG tablet Take 325 mg by mouth daily with breakfast.    [provider]  levalbuterol (XOPENEX) 0.63 MG/3ML nebulizer solution Take 3 mLs (0.63 mg total) by nebulization every 6 (six) hours as needed for wheezing or shortness of breath. DX: COPD DX Code: J44.9 01/12/19 01/12/20  Laverle Hobby, MD  losartan (COZAAR) 50 MG tablet Take 50 mg by mouth every morning.    [provider]  metoprolol succinate (TOPROL-XL) 25 MG  24 hr tablet Take 25 mg by mouth every morning.    [provider]  Multiple Vitamin (MULTIVITAMIN WITH MINERALS) TABS Take 1 tablet by mouth every morning.    [provider]  omeprazole (PRILOSEC) 40 MG capsule Take 40 mg by mouth as needed. 02/01/17   [provider]  polyethylene glycol powder (GLYCOLAX/MIRALAX) powder Take 17 g by mouth daily as needed for constipation. Mix with 4 to 8 ounces of fluid. 04/10/16   [provider]  predniSONE  (STERAPRED UNI-PAK 21 TAB) 10 MG (21) TBPK tablet Dispense as taper. 08/08/18   Laverle Hobby, MD  theophylline (THEO-24) 300 MG 24 hr capsule Take 1 capsule (300 mg total) by mouth every morning. 10/12/15   Bettey Costa, MD    Allergies Patient has no known allergies.  Family History  Problem Relation Age of Onset  . Breast cancer Mother 65  . Stroke Mother   . Breast cancer Other 61  . Heart disease Father     Social History Social History   Tobacco Use  . Smoking status: Former Smoker    Packs/day: 1.50    Years: 40.00    Pack years: 60.00    Quit date: 12/09/1999    Years since quitting: 19.3  . Smokeless tobacco: Never Used  Substance Use Topics  . Alcohol use: Yes    Comment: Socally   . Drug use: No    Review of Systems Level 5 caveat: Review of systems limited due to respiratory distress Constitutional: No fever/chills. Cardiovascular: Positive for chest pain. Respiratory: Positive for shortness of breath. Gastrointestinal: No vomiting or diarrhea.  Neurological: Negative for headache.   ____________________________________________   PHYSICAL EXAM:  VITAL SIGNS: ED Triage Vitals  Enc Vitals Group     BP 04/26/19 0208 (!) 147/83     Pulse Rate 04/26/19 0208 (!) 109     Resp 04/26/19 0208 (!) 22     Temp --      Temp src --      SpO2 04/26/19 0204 (!) 88 %     Weight 04/26/19 0210 133 lb (60.3 kg)     Height 04/26/19 0210 5\' 2"  (1.575 m)     Head Circumference --      Peak Flow --      Pain Score 04/26/19 0209 8     Pain Loc --      Pain Edu? --      Excl. in [place]? --     Constitutional: Alert and oriented.  Uncomfortable and somewhat anxious appearing. Eyes: Conjunctivae are normal.  EOMI. Head: Atraumatic. Nose: No congestion/rhinnorhea. Mouth/Throat: Mucous membranes are slightly dry.   Neck: Normal range of motion.  Cardiovascular: Tachycardic, regular rhythm. Grossly normal heart sounds.  Good peripheral circulation. Respiratory:  Increased work of breathing with some respiratory distress.  Decreased breath sounds bilaterally with some faint wheezing at the bases. Gastrointestinal: Soft and nontender. No distention.  Genitourinary: No flank tenderness. Musculoskeletal: No lower extremity edema.  Extremities warm and well perfused.  Neurologic:  Normal speech and language.  Motor intact in all extremities.  No gross focal neurologic deficits are appreciated.  Skin:  Skin is warm and dry. No rash noted. Psychiatric: Anxious appearing but cooperative.  ____________________________________________   LABS (all labs ordered are listed, but only abnormal results are displayed)  Labs Reviewed  COMPREHENSIVE METABOLIC PANEL - Abnormal; Notable for the following components:      Result Value   Potassium 5.2 (*)  Glucose, Bld 183 (*)    Creatinine, Ser 1.17 (*)    Calcium 8.1 (*)    GFR calc non Af Amer 44 (*)    GFR calc Af Amer 51 (*)    All other components within normal limits  CBC WITH DIFFERENTIAL/PLATELET - Abnormal; Notable for the following components:   RBC 3.61 (*)    Hemoglobin 11.5 (*)    MCV 100.3 (*)    Lymphs Abs 0.4 (*)    All other components within normal limits  BRAIN NATRIURETIC PEPTIDE - Abnormal; Notable for the following components:   B Natriuretic Peptide 265.0 (*)    All other components within normal limits  URINALYSIS, COMPLETE (UACMP) WITH MICROSCOPIC - Abnormal; Notable for the following components:   Color, Urine YELLOW (*)    APPearance HAZY (*)    Ketones, ur 5 (*)    Protein, ur 100 (*)    Leukocytes,Ua MODERATE (*)    WBC, UA >50 (*)    All other components within normal limits  TROPONIN I (HIGH SENSITIVITY) - Abnormal; Notable for the following components:   Troponin I (High Sensitivity) 264 (*)    All other components within normal limits  SARS CORONAVIRUS 2 (HOSPITAL ORDER, [place] LAB)  LACTIC ACID, PLASMA  LACTIC ACID, PLASMA  TROPONIN I  (HIGH SENSITIVITY)   ____________________________________________  EKG  ED ECG REPORT I, Arta Silence, the attending physician, personally viewed and interpreted this ECG.  Date: 04/26/2019 EKG Time: 0222 Rate: 108 Rhythm: normal sinus rhythm QRS Axis: normal Intervals: Nonspecific IVCD ST/T Wave abnormalities: Nonspecific ST abnormalities Narrative Interpretation: Nonspecific abnormalities with no evidence of acute ischemia  ____________________________________________  RADIOLOGY  CXR: Chronic findings consistent with COPD with no focal infiltrate  ____________________________________________   PROCEDURES  Procedure(s) performed: No  Procedures  Critical Care performed: Yes  CRITICAL CARE Performed by: Arta Silence   Total critical care time: 30 minutes  Critical care time was exclusive of separately billable procedures and treating other patients.  Critical care was necessary to treat or prevent imminent or life-threatening deterioration.  Critical care was time spent personally by me on the following activities: development of treatment plan with patient and/or surrogate as well as nursing, discussions with consultants, evaluation of patient's response to treatment, examination of patient, obtaining history from patient or surrogate, ordering and performing treatments and interventions, ordering and review of laboratory studies, ordering and review of radiographic studies, pulse oximetry and re-evaluation of patient's condition. ____________________________________________   INITIAL IMPRESSION / ASSESSMENT AND PLAN / ED COURSE  Pertinent labs & imaging results that were available during my care of the patient were reviewed by me and considered in my medical decision making (see chart for details).  83 year old female with PMH as noted above including a history of COPD on home oxygen presents with worsening shortness of breath which she states has  been escalating nightly over approximately the last week.  Tonight it was associated with some chest pain as well.  Per EMS the O2 saturation was in the mid to high 80s although it is unclear if this was on room air or on the patient's normal oxygen.  I reviewed the past medical records in [place].  The patient was most recent admitted last year for COPD exacerbation and pneumonia requiring BiPAP.  On ED arrival, the patient has some respiratory distress and increased work of breathing.  Her breath sounds are diminished bilaterally with some wheezing at the bases.  The  remainder of the exam is as described above.  Overall presentation is most consistent with COPD exacerbation although differential also includes pneumonia or possible viral etiology including COVID-19.  We placed the patient on BiPAP due to increased work of breathing.  We will continue to give bronchodilators, Solu-Medrol, obtain chest x-ray, lab work-up, and reassess.  ----------------------------------------- 3:44 AM on 04/26/2019 -----------------------------------------  The patient's work of breathing has improved.  She was having relatively significant anxiety on the BiPAP which improved after I gave 1 of Ativan.  However, we attempted to wean the patient off the BiPAP and she is now doing well on 4 L O2 by nasal cannula.  Chest x-ray shows findings consistent with COPD.  The patient's troponin is elevated.  She is no longer having active chest pain, and has no significant ischemic changes on her EKG.  I gave aspirin.  The COVID-19 swab is negative.  The patient will require admission due to her respiratory failure.  I signed the patient out to the hospitalist.  _______________________________  [name] was evaluated in Emergency Department on 04/26/2019 for the symptoms described in the history of present illness. She was evaluated in the context of the global COVID-19 pandemic, which necessitated consideration that the  patient might be at risk for infection with the SARS-CoV-2 virus that causes COVID-19. Institutional protocols and algorithms that pertain to the evaluation of patients at risk for COVID-19 are in a state of rapid change based on information released by regulatory bodies including the CDC and federal and state organizations. These policies and algorithms were followed during the patient's care in the ED.  ____________________________________________   FINAL CLINICAL IMPRESSION(S) / ED DIAGNOSES  Final diagnoses:  Acute on chronic respiratory failure with hypoxia (HCC)  Elevated troponin      NEW MEDICATIONS STARTED DURING THIS VISIT:  New Prescriptions   No medications on file     Note:  This document was prepared using Dragon voice recognition software and may include unintentional dictation errors.    Arta Silence, MD 04/26/19 [phone]

## 2019-04-26 NOTE — Evaluation (Signed)
Physical Therapy Evaluation Patient Details Name: Catherine Landry MRN: [id number] DOB: 1936/08/20 Today's Date: 04/26/2019   History of Present Illness  Pt is an 83 year old female admitted with acute on chronic hypoxic respiratory failure in the setting of COPD exacerbation and mild pulmonary edema requiring BiPAP.  Assessment includes: Acute on Chronic Hypoxic Respiratory Failure secondary to COPD Exacerbation & Mild pulmonary edema, Chest pain with elevated troponin (demand ischemia secondary to respiratory failure per cardiology), UTI, and anemia without s/sx of bleeding.    Clinical Impression  Pt presents with deficits in strength, transfers, mobility, gait, balance, and activity tolerance.  Pt required SBA with cues for sequencing along with extra time and effort and use of the bed rails with sup to/from sit.  Pt was CGA with cues for hand placement during sit to/from stand transfers.  Pt was able to amb 15' with a RW and CGA with mod lean on the walker for stability and with SpO2 dropping from the mid 90s to 83-84% on 4LO2/min.  Pt's SpO2 increased quickly back to the 90s upon sitting with cues for PLB, nursing notified.  Pt will benefit from HHPT services upon discharge to safely address above deficits for decreased caregiver assistance and eventual return to PLOF.      Follow Up Recommendations Home health PT    Equipment Recommendations  Rolling walker with 5" wheels    Recommendations for Other Services       Precautions / Restrictions Precautions Precautions: Fall Restrictions Weight Bearing Restrictions: No      Mobility  Bed Mobility Overal bed mobility: Needs Assistance Bed Mobility: Supine to Sit;Sit to Supine     Supine to sit: Supervision Sit to supine: Supervision   General bed mobility comments: Min verbal cues for sequencing with extra time, effort, and use of bed rail required but no physical assistance needed  Transfers Overall transfer level: Needs  assistance Equipment used: Rolling walker (2 wheeled) Transfers: Sit to/from Stand Sit to Stand: Min guard;From elevated surface         General transfer comment: Min verbal cues for hand placement with fair eccentric and concentric control during sit to/from stand from a slightly elevated surface  Ambulation/Gait Ambulation/Gait assistance: Min guard Gait Distance (Feet): 15 Feet Assistive device: Rolling walker (2 wheeled) Gait Pattern/deviations: Step-through pattern;Decreased step length - right;Decreased step length - left Gait velocity: decreased   General Gait Details: Slow cadence with mod lean on the RW but steady without LOB; SpO2 on 4LO2/min dropped from mid 90s to 83-84% after amb but quicky increased back to the 90s with PLB  Stairs            Wheelchair Mobility    Modified Rankin (Stroke Patients Only)       Balance Overall balance assessment: Needs assistance Sitting-balance support: Feet supported Sitting balance-Leahy Scale: Good     Standing balance support: Bilateral upper extremity supported Standing balance-Leahy Scale: Fair Standing balance comment: Mod lean on the RW in standing                             Pertinent Vitals/Pain Pain Assessment: No/denies pain    Home Living Family/patient expects to be discharged to:: Private residence Living Arrangements: Spouse/significant other Available Help at Discharge: Family;Available 24 hours/day Type of Home: House Home Access: Stairs to enter Entrance Stairs-Rails: Right Entrance Stairs-Number of Steps: 1 Home Layout: One level Home Equipment: Other (comment);Walker - 4 wheels;Cane -  quad;Cane - single point Additional Comments: Owns a tub transfer bench that pt's spouse uses    Prior Function Level of Independence: Independent         Comments: Ind amb without an AD with no fall history, Ind with ADLs, housekeeper for cleaning     Hand Dominance         Extremity/Trunk Assessment   Upper Extremity Assessment Upper Extremity Assessment: Defer to OT evaluation    Lower Extremity Assessment Lower Extremity Assessment: Generalized weakness       Communication   Communication: No difficulties  Cognition Arousal/Alertness: Awake/alert Behavior During Therapy: WFL for tasks assessed/performed Overall Cognitive Status: Within Functional Limits for tasks assessed                                        General Comments      Exercises Total Joint Exercises Ankle Circles/Pumps: Strengthening;Both;10 reps Quad Sets: Strengthening;Both;10 reps Towel Squeeze: Strengthening;Both;10 reps Hip ABduction/ADduction: AROM;Both;5 reps Long Arc Quad: Strengthening;Both;5 reps;10 reps Knee Flexion: Strengthening;Both;5 reps;10 reps Marching in Standing: AROM;Both;5 reps;Standing   Assessment/Plan    PT Assessment Patient needs continued PT services  PT Problem List Decreased strength;Decreased activity tolerance;Decreased balance;Decreased mobility;Decreased knowledge of use of DME       PT Treatment Interventions DME instruction;Gait training;Stair training;Functional mobility training;Therapeutic activities;Therapeutic exercise;Balance training;Patient/family education    PT Goals (Current goals can be found in the Care Plan section)  Acute Rehab PT Goals Patient Stated Goal: To get back to walking better PT Goal Formulation: With patient Time For Goal Achievement: 05/09/19 Potential to Achieve Goals: Good    Frequency Min 2X/week   Barriers to discharge        Co-evaluation               AM-PAC PT "6 Clicks" Mobility  Outcome Measure Help needed turning from your back to your side while in a flat bed without using bedrails?: A Little Help needed moving from lying on your back to sitting on the side of a flat bed without using bedrails?: A Little Help needed moving to and from a bed to a chair (including a  wheelchair)?: A Little Help needed standing up from a chair using your arms (e.g., wheelchair or bedside chair)?: A Little Help needed to walk in hospital room?: A Little Help needed climbing 3-5 steps with a railing? : A Little 6 Click Score: 18    End of Session Equipment Utilized During Treatment: Gait belt;Oxygen Activity Tolerance: Patient tolerated treatment well Patient left: in bed;with call bell/phone within reach;with family/visitor present Nurse Communication: Mobility status;Other (comment)(SpO2 dropped to 83-84% with amb) PT Visit Diagnosis: Difficulty in walking, not elsewhere classified (R26.2);Muscle weakness (generalized) (M62.81)    Time: 7371-0626 PT Time Calculation (min) (ACUTE ONLY): 35 min   Charges:   PT Evaluation $PT Eval Moderate Complexity: 1 Mod PT Treatments $Therapeutic Exercise: 8-22 mins        D. Royetta Asal PT, DPT 04/26/19, 12:18 PM

## 2019-04-26 NOTE — ED Notes (Signed)
Patient unable to maintain O2 saturation above 90 on nasal cannula. Patient placed on nonre-breather at 15l/min. Respiratory contacted.

## 2019-04-26 NOTE — Evaluation (Signed)
Occupational Therapy Evaluation Patient Details Name: Catherine Landry MRN: [id number] DOB: 1935/11/02 Today's Date: 04/26/2019    History of Present Illness Pt is an 83 year old female admitted with acute on chronic hypoxic respiratory failure in the setting of COPD exacerbation and mild pulmonary edema requiring BiPAP.  Assessment includes: Acute on Chronic Hypoxic Respiratory Failure secondary to COPD Exacerbation & Mild pulmonary edema, Chest pain with elevated troponin (demand ischemia secondary to respiratory failure per cardiology), UTI, and anemia without s/sx of bleeding.   Clinical Impression   Pt seen for OT evaluation this date. Pt was independent in all ADLs and mobility, living in a 1 story home with her husband whom she assists with care. Pt on 2 liters of O2 at home. Pt reports becoming easily fatigued or out of breath with minimal exertion. Pt currently requires minimal assist for lower body bathing and dressing due to poor activity tolerance. Pt educated in energy conservation conservation strategies including pursed lip breathing, activity pacing, home/routines modifications, work simplification, AE/DME, prioritizing of meaningful occupations, and falls prevention. Handout provided. Review limited as pt falling asleep during evaluation. Pt would benefit from additional skilled OT services to maximize recall and carryover of learned techniques and facilitate implementation of learned techniques into daily routines. Upon discharge, recommend [facility] services.       Follow Up Recommendations  Home health OT    Equipment Recommendations  3 in 1 bedside commode    Recommendations for Other Services       Precautions / Restrictions Precautions Precautions: Fall Restrictions Weight Bearing Restrictions: No      Mobility Bed Mobility Overal bed mobility: Needs Assistance Bed Mobility: Supine to Sit;Sit to Supine     Supine to sit: Supervision Sit to supine: Supervision    General bed mobility comments: Min verbal cues for sequencing with extra time, effort, and use of bed rail required but no physical assistance needed  Transfers Overall transfer level: Needs assistance Equipment used: Rolling walker (2 wheeled) Transfers: Sit to/from Stand Sit to Stand: Min guard;From elevated surface         General transfer comment: Min verbal cues for hand placement with fair eccentric and concentric control during sit to/from stand from a slightly elevated surface    Balance Overall balance assessment: Needs assistance Sitting-balance support: Feet supported Sitting balance-Leahy Scale: Good     Standing balance support: Bilateral upper extremity supported Standing balance-Leahy Scale: Fair Standing balance comment: Mod lean on the RW in standing                           ADL either performed or assessed with clinical judgement   ADL Overall ADL's : Needs assistance/impaired                                       General ADL Comments: Min a for LB ADL with cues for ECS t/o, Pt on 3 L of O2 at time of OT evaluation and SpO2 was noted to drop to 86-87 when talking at rest. Pt familiar with ECS, but instruction limited by lethargy.     Vision Baseline Vision/History: Wears glasses Wears Glasses: Reading only Patient Visual Report: No change from baseline       Perception     Praxis      Pertinent Vitals/Pain Pain Assessment: No/denies pain     Hand Dominance  Right   Extremity/Trunk Assessment Upper Extremity Assessment Upper Extremity Assessment: Generalized weakness   Lower Extremity Assessment Lower Extremity Assessment: Generalized weakness       Communication Communication Communication: No difficulties   Cognition Arousal/Alertness: Awake/alert Behavior During Therapy: WFL for tasks assessed/performed Overall Cognitive Status: Within Functional Limits for tasks assessed                                      General Comments       Exercises Total Joint Exercises Ankle Circles/Pumps: Strengthening;Both;10 reps Quad Sets: Strengthening;Both;10 reps Towel Squeeze: Strengthening;Both;10 reps Hip ABduction/ADduction: AROM;Both;5 reps Long Arc Quad: Strengthening;Both;5 reps;10 reps Knee Flexion: Strengthening;Both;5 reps;10 reps Marching in Standing: AROM;Both;5 reps;Standing Other Exercises Other Exercises: Pt instructed in ECS including activity pacing, PLB, and falls prevention strategies. Handout provided. Pt familiar with ECS from [facility] program. Reinforcement/instruction limited by pt fatigue during eval.   Shoulder Instructions      Home Living Family/patient expects to be discharged to:: Private residence Living Arrangements: Spouse/significant other Available Help at Discharge: Family;Available 24 hours/day Type of Home: House Home Access: Stairs to enter [employer] of Steps: 1 Entrance Stairs-Rails: Right Home Layout: One level     Bathroom Shower/Tub: Teacher, early years/pre: Handicapped height     Home Equipment: Other (comment);Walker - 4 wheels;Cane - quad;Cane - single point;Hand held shower head;Tub bench   Additional Comments: Owns a tub transfer bench that pt's spouse uses      Prior Functioning/Environment Level of Independence: Independent        Comments: Ind amb without an AD with no fall history, Ind with ADLs, housekeeper for cleaning. Cares for husband.        OT Problem List: Decreased strength;Cardiopulmonary status limiting activity;Decreased activity tolerance;Decreased safety awareness;Decreased knowledge of use of DME or AE;Decreased knowledge of precautions      OT Treatment/Interventions: Self-care/ADL training;Therapeutic exercise;Therapeutic activities;Energy conservation;DME and/or AE instruction;Patient/family education    OT Goals(Current goals can be found in the care plan section) Acute  Rehab OT Goals Patient Stated Goal: To get back to walking better OT Goal Formulation: With patient Time For Goal Achievement: 05/10/19 Potential to Achieve Goals: Good  OT Frequency: Min 1X/week   Barriers to D/C: Inaccessible home environment;Decreased caregiver support          Co-evaluation              AM-PAC OT "6 Clicks" Daily Activity     Outcome Measure Help from another person eating meals?: None Help from another person taking care of personal grooming?: None Help from another person toileting, which includes using toliet, bedpan, or urinal?: A Little Help from another person bathing (including washing, rinsing, drying)?: A Little Help from another person to put on and taking off regular upper body clothing?: A Little Help from another person to put on and taking off regular lower body clothing?: A Little 6 Click Score: 20   End of Session Nurse Communication: Other (comment)(Pt quite fatigued, falling asleep during Eval.)  Activity Tolerance: Patient limited by fatigue Patient left: in bed;with call bell/phone within reach;with bed alarm set  OT Visit Diagnosis: Other abnormalities of gait and mobility (R26.89);Muscle weakness (generalized) (M62.81)                Time: 1308-6578 OT Time Calculation (min): 12 min Charges:  OT General Charges $OT Visit: 1 Visit OT  Evaluation $OT Eval Low Complexity: 1 Low  Shara Blazing, M.S., OTR/L Ascom: [phone] 04/26/19, 2:16 PM

## 2019-04-26 NOTE — Progress Notes (Signed)
  Progress:  Tabbetha Kutscher is a 83 y.o. female presenting to ED in acute on chronic hypoxic respiratory failure with hx significant for COPD and supplemental O2 use. She remains on BiPAP this morning with SpO2 >92%. With conversation noted increased WOB. States she continues to feel fatigued but notes improvement in SOB and resolution of chest tightness. Ascultation of lungs noted with wheezing, rhonchi, and decreased breath sound at the bases. Echo pending.   Wean off biPAP as tolerated Wean fio2 as tolerated Lasix as tolerated ABX for UTI

## 2019-04-26 NOTE — Consult Note (Addendum)
Name: Catherine Landry MRN: [id number] DOB: 1936-09-02    ADMISSION DATE:  04/26/2019 CONSULTATION DATE:  04/26/2019  REFERRING MD :  Gardiner Barefoot, NP  CHIEF COMPLAINT:  Shortness of Breath  BRIEF PATIENT DESCRIPTION:  83 year old female admitted with acute on chronic hypoxic respiratory failure in the setting of COPD exacerbation and mild pulmonary edema requiring BiPAP. Pt also with UTI. COVID-19 PCR is negative  SIGNIFICANT EVENTS  8/5>> admission to stepdown  STUDIES:  N/A  CULTURES: Urine  SARS-CoV-2 PCR 8/5>> negative  ANTIBIOTICS: Levaquin 8/5>>  HISTORY OF PRESENT ILLNESS:   Mrs. Catherine Landry is a 83 year old female with a past medical history notable for COPD on home O2, GERD, hypertension, ray nods disease, renal insufficiency, and left kidney cancer who presents to [facility] ED on 04/26/2019 with complaints of shortness of breath. She is currently on BiPAP with respiratory distress, therefore history is obtained from ED and nursing notes. Per notes she reports progressive shortness of breath for approximately 2 weeks that worsened over the last 24 hours unrelieved with oxygen and albuterol treatments at home.  She also complained of chest pain described as heaviness with score 8 out of 10. Upon arrival to the ED she was noted to have acute respiratory distress and was hypoxic with O2 sats in the low 80s, therefore she was subsequently placed on BiPAP.  Initial work-up in the ED revealed high-sensitivity troponin  264, BNP 265, potassium 5.2, creatinine 1.17, WBC 5.1, lactic acid 1.0.  Chest x-ray is concerning for COPD and mild interstitial edema.  Urinalysis is concerning for UTI, and her COVID-19 PCR is negative.  She is being admitted to stepdown unit for further work-up and treatment of acute on chronic hypoxic respiratory failure in setting of COPD exacerbation and mild pulmonary edema requiring BiPAP.  PCCM is consulted for further management.  PAST MEDICAL HISTORY :   has a past  medical history of Arthritis, Asthma, Bladder cancer ([place]) (2015), Cancer ([place]) (2001), COPD (chronic obstructive pulmonary disease) ([place]), GERD (gastroesophageal reflux disease), Gout, History of bladder cancer, History of kidney cancer, Hypertension, Raynaud's disease, and Renal insufficiency.  has a past surgical history that includes Back surgery; Nephrectomy; Appendectomy; Cataract extraction; Colonoscopy with propofol (N/A, 10/15/2016); Esophagogastroduodenoscopy (egd) with propofol (N/A, 10/15/2016); and Eye surgery (Left, 12/2018). Prior to Admission medications   Medication Sig Start Date End Date Taking? Authorizing Provider  acetaminophen (TYLENOL) 650 MG CR tablet Take 650 mg by mouth every 8 (eight) hours as needed for pain.   Yes [provider]  ADVAIR DISKUS 250-50 MCG/DOSE AEPB Inhale 1 puff into the lungs 2 (two) times daily. *Rinse mouth after use* 09/18/15  Yes [provider]  aspirin EC 81 MG tablet Take 81 mg by mouth every morning.   Yes [provider]  azithromycin (ZITHROMAX) 250 MG tablet TAKE 1 TABLET (250 MG TOTAL) BY MOUTH 3 (THREE) TIMES A WEEK. [place], [facility], [place] Patient taking differently: Take 250 mg by mouth every Monday, Wednesday, and Friday.  11/30/18  Yes Laverle Hobby, MD  Calcium Carbonate-Vit D-Min (CALCIUM 600+D PLUS MINERALS) 600-400 MG-UNIT TABS Take 2 tablets by mouth daily.   Yes [provider]  COMBIVENT RESPIMAT 20-100 MCG/ACT AERS respimat Inhale 1 puff into the lungs 4 (four) times daily as needed for wheezing or shortness of breath.    Yes [provider]  fluticasone (FLONASE) 50 MCG/ACT nasal spray Place 2 sprays into both nostrils daily.   Yes [provider]  hydrOXYzine (ATARAX/VISTARIL) 10  MG tablet Take 10 mg by mouth 3 (three) times daily as needed for itching.   Yes [provider]  iron polysaccharides (NIFEREX) 150 MG capsule Take 150 mg by mouth daily.   Yes  [provider]  levalbuterol (XOPENEX) 0.63 MG/3ML nebulizer solution Take 3 mLs (0.63 mg total) by nebulization every 6 (six) hours as needed for wheezing or shortness of breath. DX: COPD DX Code: J44.9 01/12/19 01/12/20 Yes Laverle Hobby, MD  losartan (COZAAR) 50 MG tablet Take 50 mg by mouth every morning.   Yes [provider]  metoprolol succinate (TOPROL-XL) 25 MG 24 hr tablet Take 25 mg by mouth every morning.   Yes [provider]  Multiple Vitamin (MULTIVITAMIN WITH MINERALS) TABS Take 1 tablet by mouth every morning.   Yes [provider]  pantoprazole (PROTONIX) 40 MG tablet Take 40 mg by mouth 2 (two) times daily.   Yes [provider]  polyethylene glycol powder (GLYCOLAX/MIRALAX) powder Take 17 g by mouth daily as needed for constipation. Mix with 4 to 8 ounces of fluid. 04/10/16  Yes [provider]  theophylline (THEO-24) 300 MG 24 hr capsule Take 1 capsule (300 mg total) by mouth every morning. 10/12/15  Yes Mody, Sital, MD  vitamin B-12 (CYANOCOBALAMIN) 1000 MCG tablet Take 3,000 mcg by mouth daily.   Yes [provider]  vitamin C (ASCORBIC ACID) 500 MG tablet Take 500 mg by mouth daily.   Yes [provider]  predniSONE (STERAPRED UNI-PAK 21 TAB) 10 MG (21) TBPK tablet Dispense as taper. Patient not taking: Reported on 04/26/2019 08/08/18   Laverle Hobby, MD   No Known Allergies  FAMILY HISTORY:  family history includes Breast cancer (age of onset: 9) in an other family member; Breast cancer (age of onset: 59) in her mother; Heart disease in her father; Stroke in her mother. SOCIAL HISTORY:  reports that she quit smoking about 19 years ago. She has a 60.00 pack-year smoking history. She has never used smokeless tobacco. She reports current alcohol use. She reports that she does not use drugs.   COVID-19 DISASTER DECLARATION:  FULL CONTACT PHYSICAL EXAMINATION WAS NOT POSSIBLE DUE TO  TREATMENT OF COVID-19 AND  CONSERVATION OF PERSONAL PROTECTIVE EQUIPMENT, LIMITED EXAM FINDINGS INCLUDE-  Patient assessed or the symptoms described in the history of present illness.  In the context of the Global COVID-19 pandemic, which necessitated consideration that the patient might be at risk for infection with the SARS-CoV-2 virus that causes COVID-19, Institutional protocols and algorithms that pertain to the evaluation of patients at risk for COVID-19 are in a state of rapid change based on information released by regulatory bodies including the CDC and federal and state organizations. These policies and algorithms were followed during the patient's care while in hospital.  REVIEW OF SYSTEMS:  Unable to obtain due to respiratory distress and BiPAP  SUBJECTIVE:  Unable to obtain due to respiratory distress and BiPAP   VITAL SIGNS: Temp:  [97.6 F (36.4 C)] 97.6 F (36.4 C) (08/05 0217) Pulse Rate:  [109-116] 115 (08/05 0430) Resp:  [22-32] 28 (08/05 0430) BP: (111-147)/(81-89) 136/89 (08/05 0430) SpO2:  [88 %-99 %] 94 % (08/05 0430) Weight:  [60.3 kg] 60.3 kg (08/05 0210)  PHYSICAL EXAMINATION: General: Acute on chronically ill-appearing female, laying in bed, on BiPAP, with moderate respiratory distress Neuro: Awake, alert and oriented, follows commands, no focal deficits, speech clear HEENT: Atraumatic, normocephalic, neck supple, no JVD Cardiovascular: Tachycardia, regular rhythm, S1-S2, no  murmurs rubs or gallops Lungs: Diminished breath sounds bilaterally with fine crackles in the bases bilaterally, increased work of breathing with accessory muscle use, BiPAP assisted Abdomen: Soft, nontender, nondistended,  no guarding or rebound tenderness, bowel sounds present x4 Musculoskeletal: No deformities, no normal bulk and tone, no edema Skin: Warm and dry, no obvious rashes, lesions, or ulcerations  Recent Labs  Lab 04/26/19 0222  NA 136  K 5.2*  CL 102  CO2 26  BUN  22  CREATININE 1.17*  GLUCOSE 183*   Recent Labs  Lab 04/26/19 0222  HGB 11.5*  HCT 36.2  WBC 5.1  PLT 201   Dg Chest Portable 1 View  Result Date: 04/26/2019 CLINICAL DATA:  Shortness of breath EXAM: PORTABLE CHEST 1 VIEW COMPARISON:  11/23/2017 FINDINGS: Cardiac shadow is stable. Aortic calcifications are noted. The lungs are hyperinflated consistent with COPD. No focal infiltrate is seen. Interstitial edema is noted particularly on the right increased from the prior exam. IMPRESSION: COPD and mild interstitial edema. Electronically Signed   By: Inez Catalina M.D.   On: 04/26/2019 02:42    ASSESSMENT / PLAN:  Acute on Chronic Hypoxic Respiratory Failure secondary to COPD Exacerbation & Mild pulmonary edema -Supplemental O2 as needed to maintain O2 sats between 88 to 94% -BiPAP, wean as tolerated -Follow intermittent chest x-ray and ABG as needed -Bronchodilators -Budesonide nebs -IV steroids -IV Levaquin -Continue Theophylline  -prn Morphine for increased WOB/air hunger  Chest pain with elevated troponin Elevated BNP -Cardiac monitoring -Trend troponin -Continue aspirin and beta-blocker -Cardiology consulted, appreciate input -Echocardiogram pending -Trend BNP -Will give Lasix 20 mg x1 dose  Urinary tract infection -Monitor fever curve -Trend WBCs -Follow cultures as above -Continue Levaquin  Anemia without s/sx of bleeding -Monitor for S/Sx of bleeding -Trend CBC -Lovenox for VTE Prophylaxis  -Transfuse for Hgb <7     Pt is high risk for intubation.   Disposition: ICU Goals of care: Full code VTE prophylaxis: Lovenox SQ Updates: Updated patient at bedside 04/26/2019  Darel Hong, [facility] [place] Pager: [phone] Cell: [phone]  04/26/2019, 5:16 AM   PCCM ATTENDING ATTESTATION:  I have evaluated patient with the APP, I personally  reviewed database in its entirety and discussed care plan in detail. In  addition, this patient was discussed on multidisciplinary rounds.   I agree with assessment and plan.  Important exam findings: On biPAP Severe resp distress +rhonchi    Major problems addressed by PCCM team: Acute respiratory failure from COPD exacerbation  Severe ACUTE Hypoxic and Hypercapnic Respiratory Failure High risk for intubation  SEVERE COPD EXACERBATION -continue IV steroids as prescribed -continue NEB THERAPY as prescribed -morphine as needed -wean fio2 as needed and tolerated   ELECTROLYTES -follow labs as needed -replace as needed -pharmacy consultation and following    DVT/GI PRX ordered TRANSFUSIONS AS NEEDED MONITOR FSBS ASSESS the need for LABS as needed      Critical Care Time devoted to patient care services described in this note is 40 minutes.   Overall, patient is critically ill, prognosis is guarded.    Corrin Parker, M.D.  [facility]  Medical Director [place] Director [facility] Cardio-Pulmonary Department

## 2019-04-26 NOTE — Progress Notes (Signed)
Initial Nutrition Assessment  DOCUMENTATION CODES:   Not applicable  INTERVENTION:  Patient does not want any oral nutrition supplements during admission. At home she drinks frozen butter pecan Ensure.  Encouraged adequate intake of calories and protein at meals.  NUTRITION DIAGNOSIS:   Increased nutrient needs related to catabolic illness(COPD, urothelial carcinoma of bladder s/p chemo/XRT) as evidenced by estimated needs.  GOAL:   Patient will meet greater than or equal to 90% of their needs  MONITOR:   PO intake, Labs, Weight trends, I & O's  REASON FOR ASSESSMENT:   Consult COPD Protocol  ASSESSMENT:   83 year old female with PMHx of HTN, asthma, hx left nephrectomy, urothelial carcinoma of the bladder s/p chemo/XRT, COPD, Raynaud's disease, GERD, arthritis admitted with acute on chronic hypoxic respiratory failure in setting of COPD exacerbation and mild pulmonary edema, also with UTI.   Met with patient at bedside. She reports her appetite is good at baseline and that she eats well at meals. She reports eating either 3 meals per day or 2 meals and a snack. She reports eating adequate portion sizes and choosing protein at each meal. She also drinks butter pecan Ensure at home. She reports she does not want any Ensure or other ONS here because she likes hers frozen.  Patient reports she has lost about 15 lbs over the past few years since her diagnosis of bladder cancer. Per chart patient was 65.3 kg on 05/17/2018. She is now 60.3 kg (133 lbs). She has lost 5 kg (7.7% body weight) over the past year, which is not significant for time frame.  Medications reviewed and include: ferrous sulfate 325 mg daily, Solu-Medrol 40 mg Q12hrs IV, pantoprazole, vitamin B12 3000 micrograms daily, vitamin C 500 mg daily, ceftriaxone.  Labs reviewed: CBG 158, Potassium 5.2, Creatinine 1.17, BNP 265.  Patient does not meet criteria for malnutrition at this time.  NUTRITION - FOCUSED PHYSICAL  EXAM:    Most Recent Value  Orbital Region  No depletion  Upper Arm Region  Mild depletion  Thoracic and Lumbar Region  No depletion  Buccal Region  No depletion  Temple Region  Mild depletion  Clavicle Bone Region  Mild depletion  Clavicle and Acromion Bone Region  Mild depletion  Scapular Bone Region  Unable to assess  Dorsal Hand  No depletion  Patellar Region  No depletion  Anterior Thigh Region  No depletion  Posterior Calf Region  Mild depletion  Edema (RD Assessment)  None  Hair  Reviewed  Eyes  Reviewed  Mouth  Reviewed  Skin  Reviewed  Nails  Reviewed     Diet Order:   Diet Order            Diet heart healthy/carb modified Room service appropriate? Yes; Fluid consistency: Thin  Diet effective now             EDUCATION NEEDS:   No education needs have been identified at this time  Skin:  Skin Assessment: Reviewed RN Assessment  Last BM:  PTA  Height:   Ht Readings from Last 1 Encounters:  04/26/19 '5\' 2"'$  (1.575 m)   Weight:   Wt Readings from Last 1 Encounters:  04/26/19 60.3 kg   Ideal Body Weight:  50 kg  BMI:  Body mass index is 24.33 kg/m.  Estimated Nutritional Needs:   Kcal:  1500-1700  Protein:  75-85 grams  Fluid:  1.5 L/day  Willey Blade, MS, RD, LDN Office: [phone] Pager: [phone] After Hours/Weekend  Pager: [phone]

## 2019-04-26 NOTE — Progress Notes (Signed)
*  PRELIMINARY RESULTS* Echocardiogram 2D Echocardiogram has been performed.  Catherine Landry 04/26/2019, 9:47 AM

## 2019-04-26 NOTE — ED Notes (Signed)
ED TO INPATIENT HANDOFF REPORT  ED Nurse Name and Phone #:  Quillian Quince [phone]  S Name/Age/Gender Catherine Landry 83 y.o. female Room/Bed: ED06A/ED06A  Code Status   Code Status: Prior  Home/SNF/Other Home Patient oriented to: self, place, time and situation Is this baseline? Yes   Triage Complete: Triage complete  Chief Complaint [facility] - Breathing difficulty   Triage Note Pt to ED via EMS from home. C/o SOB that's been getting worse every night for the past 2 weeks. HX of COPD. PT's o2 was 88% on room air when EMS arrived. En route pt received duo neb, albuterol and 1 SL nitro. EMS attempted Cpap but pt could not tolerate. PT currently on bipap and tolerating well at this time.    Allergies No Known Allergies  Level of Care/Admitting Diagnosis ED Disposition    ED Disposition Condition [facility] Area: [place] [[id number]]  Level of Care: Stepdown [14]  Covid Evaluation: Confirmed COVID Negative  Diagnosis: Respiratory failure, acute [facility]) [[id number]]  Admitting Physician: Mayer Camel [[id number]]  Attending Physician: Mayer Camel [[id number]]  Estimated length of stay: past midnight tomorrow  Certification:: I certify this patient will need inpatient services for at least 2 midnights  PT Class (Do Not Modify): Inpatient [101]  PT Acc Code (Do Not Modify): Private [1]       B Medical/Surgery History Past Medical History:  Diagnosis Date  . Arthritis   . Asthma   . Bladder cancer ([place]) 2015  . Cancer [facility]) 2001   kidney left   . COPD (chronic obstructive pulmonary disease) ([place])   . GERD (gastroesophageal reflux disease)   . Gout   . History of bladder cancer   . History of kidney cancer   . Hypertension   . Raynaud's disease   . Renal insufficiency    Past Surgical History:  Procedure Laterality Date  . APPENDECTOMY    . BACK SURGERY    . CATARACT EXTRACTION    . COLONOSCOPY WITH PROPOFOL N/A 10/15/2016   Procedure: COLONOSCOPY WITH PROPOFOL;  Surgeon: Lollie Sails, MD;  Location: [facility] ENDOSCOPY;  Service: Endoscopy;  Laterality: N/A;  . ESOPHAGOGASTRODUODENOSCOPY (EGD) WITH PROPOFOL N/A 10/15/2016   Procedure: ESOPHAGOGASTRODUODENOSCOPY (EGD) WITH PROPOFOL;  Surgeon: Lollie Sails, MD;  Location: [facility] ENDOSCOPY;  Service: Endoscopy;  Laterality: N/A;  . EYE SURGERY Left 12/2018  . NEPHRECTOMY       A IV Location/Drains/Wounds Patient Lines/Drains/Airways Status   Active Line/Drains/Airways    Name:   Placement date:   Placement time:   Site:   Days:   Peripheral IV 04/26/19 Left Antecubital   04/26/19    0145    Antecubital   less than 1   Peripheral IV 04/26/19 Right Hand   04/26/19    0254    Hand   less than 1   Airway   10/15/16    0800     923          Intake/Output Last 24 hours No intake or output data in the 24 hours ending 04/26/19 0458  Labs/Imaging Results for orders placed or performed during the hospital encounter of 04/26/19 (from the past 48 hour(s))  Comprehensive metabolic panel     Status: Abnormal   Collection Time: 04/26/19  2:22 AM  Result Value Ref Range   Sodium 136 135 - 145 mmol/L   Potassium 5.2 (H) 3.5 - 5.1 mmol/L   Chloride  102 98 - 111 mmol/L   CO2 26 22 - 32 mmol/L   Glucose, Bld 183 (H) 70 - 99 mg/dL   BUN 22 8 - 23 mg/dL   Creatinine, Ser 1.17 (H) 0.44 - 1.00 mg/dL   Calcium 8.1 (L) 8.9 - 10.3 mg/dL   Total Protein 7.0 6.5 - 8.1 g/dL   Albumin 4.1 3.5 - 5.0 g/dL   AST 28 15 - 41 U/L   ALT 16 0 - 44 U/L   Alkaline Phosphatase 51 38 - 126 U/L   Total Bilirubin 0.7 0.3 - 1.2 mg/dL   GFR calc non Af Amer 44 (L) >60 mL/min   GFR calc Af Amer 51 (L) >60 mL/min   Anion gap 8 5 - 15    Comment: Performed at [facility], [address] [place], [place] [address]  CBC with Differential     Status: Abnormal   Collection Time: 04/26/19  2:22 AM  Result Value Ref Range   WBC 5.1 4.0 - 10.5 K/uL   RBC 3.61 (L) 3.87 - 5.11 MIL/uL    Hemoglobin 11.5 (L) 12.0 - 15.0 g/dL   HCT 36.2 36.0 - 46.0 %   MCV 100.3 (H) 80.0 - 100.0 fL   MCH 31.9 26.0 - 34.0 pg   MCHC 31.8 30.0 - 36.0 g/dL   RDW 13.5 11.5 - 15.5 %   Platelets 201 150 - 400 K/uL   nRBC 0.0 0.0 - 0.2 %   Neutrophils Relative % 88 %   Neutro Abs 4.5 1.7 - 7.7 K/uL   Lymphocytes Relative 9 %   Lymphs Abs 0.4 (L) 0.7 - 4.0 K/uL   Monocytes Relative 3 %   Monocytes Absolute 0.1 0.1 - 1.0 K/uL   Eosinophils Relative 0 %   Eosinophils Absolute 0.0 0.0 - 0.5 K/uL   Basophils Relative 0 %   Basophils Absolute 0.0 0.0 - 0.1 K/uL   Immature Granulocytes 0 %   Abs Immature Granulocytes 0.02 0.00 - 0.07 K/uL    Comment: Performed at [facility], [address] [place], [place] [address]  Lactic acid, plasma     Status: None   Collection Time: 04/26/19  2:22 AM  Result Value Ref Range   Lactic Acid, Venous 1.0 0.5 - 1.9 mmol/L    Comment: Performed at [facility], [address] [place], [place] [address]  Brain natriuretic peptide     Status: Abnormal   Collection Time: 04/26/19  2:22 AM  Result Value Ref Range   B Natriuretic Peptide 265.0 (H) 0.0 - 100.0 pg/mL    Comment: Performed at [facility], [address] [place], [place] [address]  Troponin I (High Sensitivity)     Status: Abnormal   Collection Time: 04/26/19  2:22 AM  Result Value Ref Range   Troponin I (High Sensitivity) 264 (HH) <18 ng/L    Comment: CRITICAL RESULT CALLED TO, READ BACK BY AND VERIFIED WITH PAIGE JOHNSON RN AT [phone] ON 04/26/2019 SNG (NOTE) Elevated high sensitivity troponin I (hsTnI) values and significant  changes across serial measurements may suggest ACS but many other  chronic and acute conditions are known to elevate hsTnI results.  Refer to the "Links" section for chest pain algorithms and additional  guidance. Performed at [facility], [address], [place],  [address]   Urinalysis, Complete w Microscopic      Status: Abnormal   Collection Time: 04/26/19  2:22 AM  Result Value Ref  Range   Color, Urine YELLOW (A) YELLOW   APPearance HAZY (A) CLEAR   Specific Gravity, Urine 1.016 1.005 - 1.030   pH 5.0 5.0 - 8.0   Glucose, UA NEGATIVE NEGATIVE mg/dL   Hgb urine dipstick NEGATIVE NEGATIVE   Bilirubin Urine NEGATIVE NEGATIVE   Ketones, ur 5 (A) NEGATIVE mg/dL   Protein, ur 100 (A) NEGATIVE mg/dL   Nitrite NEGATIVE NEGATIVE   Leukocytes,Ua MODERATE (A) NEGATIVE   RBC / HPF 6-10 0 - 5 RBC/hpf   WBC, UA >50 (H) 0 - 5 WBC/hpf   Bacteria, UA NONE SEEN NONE SEEN   Squamous Epithelial / LPF 0-5 0 - 5   Mucus PRESENT    Hyaline Casts, UA PRESENT     Comment: Performed at [facility], [address], [place], [place] [address]  SARS Coronavirus 2 [facility] order, Performed in [place] hospital lab) Nasopharyngeal     Status: None   Collection Time: 04/26/19  2:22 AM   Specimen: Nasopharyngeal  Result Value Ref Range   SARS Coronavirus 2 NEGATIVE NEGATIVE    Comment: (NOTE) If result is NEGATIVE SARS-CoV-2 target nucleic acids are NOT DETECTED. The SARS-CoV-2 RNA is generally detectable in upper and lower  respiratory specimens during the acute phase of infection. The lowest  concentration of SARS-CoV-2 viral copies this assay can detect is 250  copies / mL. A negative result does not preclude SARS-CoV-2 infection  and should not be used as the sole basis for treatment or other  patient management decisions.  A negative result may occur with  improper specimen collection / handling, submission of specimen other  than nasopharyngeal swab, presence of viral mutation(s) within the  areas targeted by this assay, and inadequate number of viral copies  (<250 copies / mL). A negative result must be combined with clinical  observations, patient history, and epidemiological information. If result is POSITIVE SARS-CoV-2 target nucleic acids are DETECTED. The SARS-CoV-2 RNA is generally  detectable in upper and lower  respiratory specimens dur ing the acute phase of infection.  Positive  results are indicative of active infection with SARS-CoV-2.  Clinical  correlation with patient history and other diagnostic information is  necessary to determine patient infection status.  Positive results do  not rule out bacterial infection or co-infection with other viruses. If result is PRESUMPTIVE POSTIVE SARS-CoV-2 nucleic acids MAY BE PRESENT.   A presumptive positive result was obtained on the submitted specimen  and confirmed on repeat testing.  While 2019 novel coronavirus  (SARS-CoV-2) nucleic acids may be present in the submitted sample  additional confirmatory testing may be necessary for epidemiological  and / or clinical management purposes  to differentiate between  SARS-CoV-2 and other Sarbecovirus currently known to infect humans.  If clinically indicated additional testing with an alternate test  methodology [phone]) is advised. The SARS-CoV-2 RNA is generally  detectable in upper and lower respiratory sp ecimens during the acute  phase of infection. The expected result is Negative. Fact Sheet for Patients:  StrictlyIdeas.no Fact Sheet for Healthcare Providers: BankingDealers.co.za This test is not yet approved or cleared by the [place] FDA and has been authorized for detection and/or diagnosis of SARS-CoV-2 by FDA under an Emergency Use Authorization (EUA).  This EUA will remain in effect (meaning this test can be used) for the duration of the COVID-19 declaration under Section 564(b)(1) of the Act, 21 U.S.C. section 360bbb-3(b)(1), unless the authorization is terminated or revoked sooner. Performed at [facility], [address] [place], [place] [address]    Dg Chest Portable 1 View  Result Date: 04/26/2019 CLINICAL DATA:  Shortness of breath EXAM: PORTABLE CHEST 1 VIEW COMPARISON:  11/23/2017  FINDINGS: Cardiac shadow is stable. Aortic calcifications are noted. The lungs are hyperinflated consistent with COPD. No focal infiltrate is seen. Interstitial edema is noted particularly on the right increased from the prior exam. IMPRESSION: COPD and mild interstitial edema. Electronically Signed   By: Inez Catalina M.D.   On: 04/26/2019 02:42    Pending Labs Unresulted Labs (From admission, onward)    Start     Ordered   04/26/19 0212  Lactic acid, plasma  Now then every 2 hours,   STAT     04/26/19 0212   Signed and Held  HIV antibody  Once,   R     Signed and Held   Signed and Held  CBC  (enoxaparin (LOVENOX)    CrCl >/= 30 ml/min)  Once,   R    Comments: Baseline for enoxaparin therapy IF NOT ALREADY DRAWN.  Notify MD if PLT < 100 K.    Signed and Held   Signed and Held  Creatinine, serum  (enoxaparin (LOVENOX)    CrCl >/= 30 ml/min)  Once,   R    Comments: Baseline for enoxaparin therapy IF NOT ALREADY DRAWN.    Signed and Held   Signed and Held  Creatinine, serum  (enoxaparin (LOVENOX)    CrCl >/= 30 ml/min)  Weekly,   R    Comments: while on enoxaparin therapy    Signed and Held   Signed and Held  TSH  Once,   R     Signed and Held   Signed and Held  Urine culture  Once,   R    Question:  Patient immune status  Answer:  Normal   Signed and Held   Visual merchandiser and Occupational hygienist morning,   R     Signed and Held   Signed and Held  CBC  Tomorrow morning,   R     Signed and Held   Signed and Held  Blood gas, arterial  Once,   STAT     Signed and Held   Signed and Held  Urine culture  Once,   R    Question:  Patient immune status  Answer:  Normal   Signed and Held          Vitals/Pain Today's Vitals   04/26/19 0230 04/26/19 0244 04/26/19 0400 04/26/19 0430  BP: 132/85  111/81 136/89  Pulse: (!) 109  (!) 116 (!) 115  Resp: (!) 28  (!) 32 (!) 28  Temp:      TempSrc:      SpO2: 95% 98% 96% 94%  Weight:      Height:      PainSc:        Isolation  Precautions No active isolations  Medications Medications  ipratropium-albuterol (DUONEB) 0.5-2.5 (3) MG/3ML nebulizer solution 3 mL (3 mLs Nebulization Given 04/26/19 0244)  methylPREDNISolone sodium succinate (SOLU-MEDROL) 125 mg/2 mL injection 125 mg (125 mg Intravenous Given 04/26/19 0220)  LORazepam (ATIVAN) injection 1 mg (1 mg Intravenous Given 04/26/19 0300)  aspirin chewable tablet 324 mg (324 mg Oral Given 04/26/19 0327)    Mobility walks Low fall risk   Focused Assessments Cardiac Assessment Handoff:  Cardiac Rhythm: Sinus tachycardia Lab Results  Component Value Date   TROPONINI <  0.03 11/22/2017   No results found for: DDIMER Does the Patient currently have chest pain? No     R Recommendations: See Admitting Provider Note  Report given to:   Additional Notes: Patient came in for shortness of breath that she states has gone on for weeks. Patient has an elevated troponin. A second troponin has been drawn and is in process. We attempted to titrate the patient's oxygen down, but she is unable to maintain O2 above 90% on nasal cannula or non re-breather. Patient to continue to be evaluated for any cardiac abnormalities.

## 2019-04-26 NOTE — ED Triage Notes (Signed)
Pt to ED via EMS from home. C/o SOB that's been getting worse every night for the past 2 weeks. HX of COPD. PT's o2 was 88% on room air when EMS arrived. En route pt received duo neb, albuterol and 1 SL nitro. EMS attempted Cpap but pt could not tolerate. PT currently on bipap and tolerating well at this time.

## 2019-04-26 NOTE — ED Notes (Signed)
Called  Respiratory to administer breathing tx through bipap machine

## 2019-04-26 NOTE — ED Notes (Signed)
MD made aware of elevated troponin. Orders for baby aspirin.

## 2019-04-26 NOTE — Consult Note (Signed)
Cardiology Consultation Note    Patient ID: Catherine Landry, MRN: [id number], DOB/AGE: Jan 17, 1936 83 y.o. Admit date: 04/26/2019   Date of Consult: 04/26/2019 Primary Physician: Tracie Harrier, MD Primary Cardiologist: Dr. Ubaldo Glassing  Chief Complaint: sob Reason for Consultation: elevated troponin Requesting MD: NP Seals  HPI: Catherine Landry is a 83 y.o. female with history significant for oxygen dependent COPD, history of malignant neoplasm of the bladder, pulmonary hypertension, GERD, and essential hypertension who was last seen in our office a week ago with complaints of shortness of breath.  She has an occasional chest burning when she lays down at night especially after eating high acid foods such as chocolate and alcohol.  She denied any tachycardia.  Echocardiogram done in June 2019 revealed normal LV function with an EF of 55% with moderate TR and moderate to severe pulmonary hypertension with estimated RV systolic pressures of 79 mmHg.  She presented to emergency room with 2-week history of increased shortness of breath worse over the last 24 hours.  She attempted to improve symptoms with oxygen and albuterol at home without improvement.  She also complained of heaviness in her chest.  Her chest pain resolved after being placed on BiPAP.  Chest pain was nonradiating and not associated with nausea or diaphoresis.  She had fevers and chills at home as well as productive cough.  Troponin high-sensitivity was 264 on presentation.  Her pulse oximetry was 80% on 2 L of oxygen but improved to 98% on BiPAP.  Chest x-ray revealed no acute pulmonary disease.  Electrocardiogram showed sinus rhythm with interventricular conduction delay.  Arterial blood gas earlier this morning showed a pH of 7.28 with PCO2 of 60 PO2 of 79.  Patient denies chest pain.   Past Medical History:  Diagnosis Date  . Arthritis   . Asthma   . Bladder cancer ([place]) 2015  . Cancer [facility]) 2001   kidney left   . COPD  (chronic obstructive pulmonary disease) ([place])   . GERD (gastroesophageal reflux disease)   . Gout   . History of bladder cancer   . History of kidney cancer   . Hypertension   . Raynaud's disease   . Renal insufficiency       Surgical History:  Past Surgical History:  Procedure Laterality Date  . APPENDECTOMY    . BACK SURGERY    . CATARACT EXTRACTION    . COLONOSCOPY WITH PROPOFOL N/A 10/15/2016   Procedure: COLONOSCOPY WITH PROPOFOL;  Surgeon: Lollie Sails, MD;  Location[facility] ENDOSCOPY;  Service: Endoscopy;  Laterality: N/A;  . ESOPHAGOGASTRODUODENOSCOPY (EGD) WITH PROPOFOL N/A 10/15/2016   Procedure: ESOPHAGOGASTRODUODENOSCOPY (EGD) WITH PROPOFOL;  Surgeon: Lollie Sails, MD;  Location: [facility] ENDOSCOPY;  Service: Endoscopy;  Laterality: N/A;  . EYE SURGERY Left 12/2018  . NEPHRECTOMY       Home Meds: Prior to Admission medications   Medication Sig Start Date End Date Taking? Authorizing Provider  acetaminophen (TYLENOL) 650 MG CR tablet Take 650 mg by mouth every 8 (eight) hours as needed for pain.   Yes [provider]  ADVAIR DISKUS 250-50 MCG/DOSE AEPB Inhale 1 puff into the lungs 2 (two) times daily. *Rinse mouth after use* 09/18/15  Yes [provider]  aspirin EC 81 MG tablet Take 81 mg by mouth every morning.   Yes [provider]  azithromycin (ZITHROMAX) 250 MG tablet TAKE 1 TABLET (250 MG TOTAL) BY MOUTH 3 (THREE) TIMES A WEEK. [place], [facility], [place] Patient taking  differently: Take 250 mg by mouth every Monday, Wednesday, and Friday.  11/30/18  Yes Laverle Hobby, MD  Calcium Carbonate-Vit D-Min (CALCIUM 600+D PLUS MINERALS) 600-400 MG-UNIT TABS Take 2 tablets by mouth daily.   Yes [provider]  COMBIVENT RESPIMAT 20-100 MCG/ACT AERS respimat Inhale 1 puff into the lungs 4 (four) times daily as needed for wheezing or shortness of breath.    Yes [provider]  fluticasone (FLONASE) 50 MCG/ACT nasal spray  Place 2 sprays into both nostrils daily.   Yes [provider]  hydrOXYzine (ATARAX/VISTARIL) 10 MG tablet Take 10 mg by mouth 3 (three) times daily as needed for itching.   Yes [provider]  iron polysaccharides (NIFEREX) 150 MG capsule Take 150 mg by mouth daily.   Yes [provider]  levalbuterol (XOPENEX) 0.63 MG/3ML nebulizer solution Take 3 mLs (0.63 mg total) by nebulization every 6 (six) hours as needed for wheezing or shortness of breath. DX: COPD DX Code: J44.9 01/12/19 01/12/20 Yes Laverle Hobby, MD  losartan (COZAAR) 50 MG tablet Take 50 mg by mouth every morning.   Yes [provider]  metoprolol succinate (TOPROL-XL) 25 MG 24 hr tablet Take 25 mg by mouth every morning.   Yes [provider]  Multiple Vitamin (MULTIVITAMIN WITH MINERALS) TABS Take 1 tablet by mouth every morning.   Yes [provider]  pantoprazole (PROTONIX) 40 MG tablet Take 40 mg by mouth 2 (two) times daily.   Yes [provider]  polyethylene glycol powder (GLYCOLAX/MIRALAX) powder Take 17 g by mouth daily as needed for constipation. Mix with 4 to 8 ounces of fluid. 04/10/16  Yes [provider]  theophylline (THEO-24) 300 MG 24 hr capsule Take 1 capsule (300 mg total) by mouth every morning. 10/12/15  Yes Mody, Sital, MD  vitamin B-12 (CYANOCOBALAMIN) 1000 MCG tablet Take 3,000 mcg by mouth daily.   Yes [provider]  vitamin C (ASCORBIC ACID) 500 MG tablet Take 500 mg by mouth daily.   Yes [provider]  predniSONE (STERAPRED UNI-PAK 21 TAB) 10 MG (21) TBPK tablet Dispense as taper. Patient not taking: Reported on 04/26/2019 08/08/18   Laverle Hobby, MD    Inpatient Medications:  . aspirin EC  81 mg Oral q morning - 10a  . budesonide (PULMICORT) nebulizer solution  0.25 mg Nebulization BID  . chlorhexidine  15 mL Mouth Rinse BID  . Chlorhexidine Gluconate Cloth  6 each Topical Q0600  . enoxaparin  (LOVENOX) injection  40 mg Subcutaneous Q24H  . ferrous sulfate  325 mg Oral Q breakfast  . fluticasone  2 spray Each Nare Daily  . ipratropium-albuterol  3 mL Nebulization Q6H  . iron polysaccharides  150 mg Oral Daily  . losartan  50 mg Oral q morning - 10a  . mouth rinse  15 mL Mouth Rinse q12n4p  . methylPREDNISolone (SOLU-MEDROL) injection  60 mg Intravenous Q6H   Followed by  . [START ON 04/27/2019] predniSONE  40 mg Oral Q breakfast  . metoprolol succinate  25 mg Oral q morning - 10a  . mometasone-formoterol  2 puff Inhalation BID  . pantoprazole  40 mg Oral BID  . sodium chloride flush  3 mL Intravenous Q12H  . sodium chloride flush  3 mL Intravenous Q12H  . theophylline  300 mg Oral q morning - 10a  . vitamin B-12  3,000 mcg Oral Daily  . vitamin C  500 mg Oral Daily   . sodium chloride    .  levofloxacin (LEVAQUIN) IV      Allergies: No Known Allergies  Social History   Socioeconomic History  . Marital status: Married    Spouse name: Not on file  . Number of children: Not on file  . Years of education: Not on file  . Highest education level: Not on file  Occupational History  . Not on file  Social Needs  . Financial resource strain: Not on file  . Food insecurity    Worry: Not on file    Inability: Not on file  . Transportation needs    Medical: Not on file    Non-medical: Not on file  Tobacco Use  . Smoking status: Former Smoker    Packs/day: 1.50    Years: 40.00    Pack years: 60.00    Quit date: 12/09/1999    Years since quitting: 19.3  . Smokeless tobacco: Never Used  Substance and Sexual Activity  . Alcohol use: Yes    Comment: Socally   . Drug use: No  . Sexual activity: Yes    Birth control/protection: Post-menopausal  Lifestyle  . Physical activity    Days per week: Not on file    Minutes per session: Not on file  . Stress: Not on file  Relationships  . Social Herbalist on phone: Not on file    Gets together: Not on file     Attends religious service: Not on file    Active member of club or organization: Not on file    Attends meetings of clubs or organizations: Not on file    Relationship status: Not on file  . Intimate partner violence    Fear of current or ex partner: Not on file    Emotionally abused: Not on file    Physically abused: Not on file    Forced sexual activity: Not on file  Other Topics Concern  . Not on file  Social History Narrative  . Not on file     Family History  Problem Relation Age of Onset  . Breast cancer Mother 53  . Stroke Mother   . Breast cancer Other 25  . Heart disease Father      Review of Systems: A 12-system review of systems was performed and is negative except as noted in the HPI.  Labs: No results for input(s): CKTOTAL, CKMB, TROPONINI in the last 72 hours. Lab Results  Component Value Date   WBC 5.1 04/26/2019   HGB 11.5 (L) 04/26/2019   HCT 36.2 04/26/2019   MCV 100.3 (H) 04/26/2019   PLT 201 04/26/2019    Recent Labs  Lab 04/26/19 0222  NA 136  K 5.2*  CL 102  CO2 26  BUN 22  CREATININE 1.17*  CALCIUM 8.1*  PROT 7.0  BILITOT 0.7  ALKPHOS 51  ALT 16  AST 28  GLUCOSE 183*   No results found for: CHOL, HDL, LDLCALC, TRIG No results found for: DDIMER  Radiology/Studies:  Dg Chest Portable 1 View  Result Date: 04/26/2019 CLINICAL DATA:  Shortness of breath EXAM: PORTABLE CHEST 1 VIEW COMPARISON:  11/23/2017 FINDINGS: Cardiac shadow is stable. Aortic calcifications are noted. The lungs are hyperinflated consistent with COPD. No focal infiltrate is seen. Interstitial edema is noted particularly on the right increased from the prior exam. IMPRESSION: COPD and mild interstitial edema. Electronically Signed   By: Inez Catalina M.D.   On: 04/26/2019 02:42   Mm 3d Screen Breast Bilateral  Result Date:  03/29/2019 CLINICAL DATA:  Screening. EXAM: DIGITAL SCREENING BILATERAL MAMMOGRAM WITH TOMO AND CAD COMPARISON:  Previous exam(s). ACR Breast Density  Category c: The breast tissue is heterogeneously dense, which may obscure small masses. FINDINGS: There are no findings suspicious for malignancy. Images were processed with CAD. IMPRESSION: No mammographic evidence of malignancy. A result letter of this screening mammogram will be mailed directly to the patient. RECOMMENDATION: Screening mammogram in one year. (Code:SM-B-01Y) BI-RADS CATEGORY  1: Negative. Electronically Signed   By: Margarette Canada M.D.   On: 03/29/2019 15:54    Wt Readings from Last 3 Encounters:  04/26/19 60.3 kg  05/17/18 65.3 kg  03/17/18 65.3 kg    EKG: sinus rhythm with ivcd. No obvious ischemia  Physical Exam:  Blood pressure 138/88, pulse (!) 116, temperature 97.6 F (36.4 C), temperature source Axillary, resp. rate 20, height 5\' 2"  (1.575 m), weight 60.3 kg, SpO2 98 %. Body mass index is 24.33 kg/m. General: Well developed, well nourished, in no acute distress. Head: Normocephalic, atraumatic, sclera non-icteric, no xanthomas, nares are without discharge.  Neck: Negative for carotid bruits. JVD not elevated. Lungs: Clear bilaterally to auscultation without wheezes, rales, or rhonchi.  Breathing with BiPAP. Heart: RRR with S1 S2. No murmurs, rubs, or gallops appreciated. Abdomen: Soft, non-tender, non-distended with normoactive bowel sounds. No hepatomegaly. No rebound/guarding. No obvious abdominal masses. Msk:  Strength and tone appear normal for age. Extremities: No clubbing or cyanosis. No edema.  Distal pedal pulses are 2+ and equal bilaterally. Neuro: Alert and oriented X 3. No facial asymmetry. No focal deficit. Moves all extremities spontaneously. Psych:  Responds to questions appropriately with a normal affect.     Assessment and Plan  83 year old female with severe pulmonary hypertension with estimated RV systolic pressure of 79 by echo last year who was admitted with increasing shortness of breath and noted to be hypoxic requiring BiPAP.  Chest x-ray  revealed possible mild pulmonary edema.  She had high-sensitivity troponin elevation to 466 thus far.  It was 264 on presentation.  Patient had chest pain in the emergency room however this resolved when being placed on BiPAP.  BNP showed mild elevation at 265.  Renal function was slightly decreased at 1.17 up from a baseline of approximately 1.  Potassium this morning was 5.2.  EKG showed no obvious ischemia.  Elevated troponin appears to be demand secondary to respiratory failure.  She does not appear to have had an acute coronary event.  We will continue to aggressively treat her pulmonary status.  We will follow with you.  Echo is pending to reassess LV function estimated RV pressures.  Ivin Booty MD 04/26/2019, 8:03 AM Pager: [phone]

## 2019-04-26 NOTE — Progress Notes (Signed)
PHARMACY NOTE:  ANTIMICROBIAL RENAL DOSAGE ADJUSTMENT  Current antimicrobial regimen includes a mismatch between antimicrobial dosage and estimated renal function.  As per policy approved by the Pharmacy & Therapeutics and Medical Executive Committees, the antimicrobial dosage will be adjusted accordingly.  Current antimicrobial dosage:  Levaquin 750mg  IV q24hrs  Indication: COPD exacerbatioin  Renal Function:  Estimated Creatinine Clearance: 32.3 mL/min (A) (by C-G formula based on SCr of 1.17 mg/dL (H)).     Antimicrobial dosage has been changed to:  Levaquin 750mg  IV q48hrs  Additional comments:  Monitor renal fxn and progress.   Thank you for allowing pharmacy to be a part of this patient's care.  Ena Dawley, [facility] 04/26/2019 6:55 AM

## 2019-04-26 NOTE — Clinical Social Work Note (Signed)
Went by room to discuss home health. Patient sleeping heavily and would not wake up to [place] calling her name.  Dayton Scrape, [place]

## 2019-04-27 ENCOUNTER — Inpatient Hospital Stay: Payer: Medicare Other

## 2019-04-27 LAB — BASIC METABOLIC PANEL
Anion gap: 7 (ref 5–15)
BUN: 39 mg/dL — ABNORMAL HIGH (ref 8–23)
CO2: 27 mmol/L (ref 22–32)
Calcium: 8.3 mg/dL — ABNORMAL LOW (ref 8.9–10.3)
Chloride: 103 mmol/L (ref 98–111)
Creatinine, Ser: 1.6 mg/dL — ABNORMAL HIGH (ref 0.44–1.00)
GFR calc Af Amer: 35 mL/min — ABNORMAL LOW (ref >60)
GFR calc non Af Amer: 30 mL/min — ABNORMAL LOW (ref >60)
Glucose, Bld: 188 mg/dL — ABNORMAL HIGH (ref 70–99)
Potassium: 4.9 mmol/L (ref 3.5–5.1)
Sodium: 137 mmol/L (ref 135–145)

## 2019-04-27 LAB — HIV ANTIBODY (ROUTINE TESTING W REFLEX): HIV Screen 4th Generation wRfx: NONREACTIVE

## 2019-04-27 LAB — URINE CULTURE: Special Requests: NORMAL

## 2019-04-27 LAB — BRAIN NATRIURETIC PEPTIDE: B Natriuretic Peptide: 1095 pg/mL — ABNORMAL HIGH (ref 0.0–100.0)

## 2019-04-27 MED ORDER — ENOXAPARIN SODIUM 30 MG/0.3ML ~~LOC~~ SOLN
30.0000 mg | SUBCUTANEOUS | Status: DC
  Administered 2019-04-27 – 2019-04-29 (×3): 30 mg via SUBCUTANEOUS
  Filled 2019-04-27 (×4): qty 0.3

## 2019-04-27 MED ORDER — GUAIFENESIN-DM 100-10 MG/5ML PO SYRP
5.0000 mL | ORAL_SOLUTION | ORAL | Status: DC | PRN
  Administered 2019-04-27 – 2019-04-29 (×5): 5 mL via ORAL
  Filled 2019-04-27 (×5): qty 5

## 2019-04-27 NOTE — Progress Notes (Signed)
Occupational Therapy Treatment Patient Details Name: Catherine Landry MRN: [id number] DOB: [birth year] Today's Date: 04/27/2019    History of present illness Pt is an 83 year old female admitted with acute on chronic hypoxic respiratory failure in the setting of COPD exacerbation and mild pulmonary edema requiring BiPAP.  Assessment includes: Acute on Chronic Hypoxic Respiratory Failure secondary to COPD Exacerbation & Mild pulmonary edema, Chest pain with elevated troponin (demand ischemia secondary to respiratory failure per cardiology), UTI, and anemia without s/sx of bleeding.   OT comments  Pt seen for OT tx this date. Pt eager to participate. Pt instructed in ECS with review of handout from previous session to suport recall and carryover. Pt educated in benefits of a wedge pillow for her regular bed at home to elevate her UB to improve her breathing overnight. Pt verbalized a plan to speak with her family about getting one. Pt continues to benefit from skilled OT services to maximize return to PLOF and minimize risk of functional decline, falls, caregiver burden, and readmission. Will continue to progress towards OT goals while hospitalized.    Follow Up Recommendations  Home health OT    Equipment Recommendations  3 in 1 bedside commode;Other (comment)(wedge pillow)    Recommendations for Other Services      Precautions / Restrictions Precautions Precautions: Fall Restrictions Weight Bearing Restrictions: No       Mobility Bed Mobility Overal bed mobility: Needs Assistance             General bed mobility comments: supervision level  Transfers                      Balance Overall balance assessment: Needs assistance Sitting-balance support: Feet supported Sitting balance-Leahy Scale: Good                                     ADL either performed or assessed with clinical judgement   ADL Overall ADL's : Needs assistance/impaired                                       General ADL Comments: Min a for LB ADL with cues for ECS t/o, Pt now on 2L (baseline) O2     Vision Baseline Vision/History: Wears glasses Wears Glasses: Reading only Patient Visual Report: No change from baseline     Perception     Praxis      Cognition Arousal/Alertness: Awake/alert Behavior During Therapy: WFL for tasks assessed/performed Overall Cognitive Status: Within Functional Limits for tasks assessed                                          Exercises Other Exercises Other Exercises: Pt instructed in ECS with review of handout from previous session to suport recall and carryover. Pt educated in benefits of a wedge pillow for her regular bed at home to elevate her UB to improve her breathing overnight. Pt verbalized a plan to speak with her family about getting one.   Shoulder Instructions       General Comments On 2L, HR 91, O2 sats 90%    Pertinent Vitals/ Pain       Pain Assessment: No/denies pain  Home Living  Prior Functioning/Environment              Frequency  Min 1X/week        Progress Toward Goals  OT Goals(current goals can now be found in the care plan section)  Progress towards OT goals: Progressing toward goals  Acute Rehab OT Goals Patient Stated Goal: To get back to walking better OT Goal Formulation: With patient Time For Goal Achievement: 05/10/19 Potential to Achieve Goals: Good  Plan Discharge plan remains appropriate;Frequency remains appropriate    Co-evaluation                 AM-PAC OT "6 Clicks" Daily Activity     Outcome Measure   Help from another person eating meals?: None Help from another person taking care of personal grooming?: None Help from another person toileting, which includes using toliet, bedpan, or urinal?: A Little Help from another person bathing (including washing,  rinsing, drying)?: A Little Help from another person to put on and taking off regular upper body clothing?: A Little Help from another person to put on and taking off regular lower body clothing?: A Little 6 Click Score: 20    End of Session    OT Visit Diagnosis: Other abnormalities of gait and mobility (R26.89);Muscle weakness (generalized) (M62.81)   Activity Tolerance Patient tolerated treatment well   Patient Left in bed;with call bell/phone within reach;with bed alarm set   Nurse Communication Other (comment)(notified nurse tech that pt requesting change of clothes/linens 2/2 bladder incontinence)        Time: 6283-6629 OT Time Calculation (min): 23 min  Charges: OT General Charges $OT Visit: 1 Visit OT Treatments $Self Care/Home Management : 23-37 mins  Jeni Salles, MPH, MS, OTR/L ascom [phone] 04/27/19, 4:47 PM

## 2019-04-27 NOTE — Progress Notes (Signed)
[facility] [place] NAME: Catherine Landry    MR#:  [id number]  DATE OF BIRTH:  09-29-1935  SUBJECTIVE: Admitted for COPD and CHF exacerbation.  He initially required BiPAP, now on 2 L of oxygen and transferred to telemetry.  Patient usually on chronically on 2 L of oxygen.  CHIEF COMPLAINT:   Chief Complaint  Patient presents with  . Shortness of Breath    REVIEW OF SYSTEMS:   ROS CONSTITUTIONAL: No fever, fatigue or weakness.  EYES: No blurred or double vision.  EARS, NOSE, AND THROAT: No tinnitus or ear pain.  RESPIRATORY: Cough, shortness of breath improved.  CARDIOVASCULAR: No chest pain, orthopnea, edema.  GASTROINTESTINAL: No nausea, vomiting, diarrhea or abdominal pain.  GENITOURINARY: No dysuria, hematuria.  ENDOCRINE: No polyuria, nocturia,  HEMATOLOGY: No anemia, easy bruising or bleeding SKIN: No rash or lesion. MUSCULOSKELETAL: No joint pain or arthritis.   NEUROLOGIC: No tingling, numbness, weakness.  PSYCHIATRY: No anxiety or depression.   DRUG ALLERGIES:  No Known Allergies  VITALS:  Blood pressure 113/67, pulse 93, temperature 97.8 F (36.6 C), temperature source Oral, resp. rate 20, height 5\' 2"  (1.575 m), weight 60.3 kg, SpO2 92 %.  PHYSICAL EXAMINATION:  GENERAL:  83 y.o.-year-old patient lying in the bed with no acute distress.  EYES: Pupils equal, round, reactive to light and accommodation. No scleral icterus. Extraocular muscles intact.  HEENT: Head atraumatic, normocephalic. Oropharynx and nasopharynx clear.  NECK:  Supple, no jugular venous distention. No thyroid enlargement, no tenderness.  LUNGS: Faint bilateral expiratory wheeze. CARDIOVASCULAR: S1, S2 normal. No murmurs, rubs, or gallops.  ABDOMEN: Soft, nontender, nondistended. Bowel sounds present. No organomegaly or mass.  EXTREMITIES: No pedal edema, cyanosis, or clubbing.  NEUROLOGIC: Cranial nerves II through XII are intact. Muscle  strength 5/5 in all extremities. Sensation intact. Gait not checked.  PSYCHIATRIC: The patient is alert and oriented x 3.  SKIN: No obvious rash, lesion, or ulcer.    LABORATORY PANEL:   CBC Recent Labs  Lab 04/26/19 0222  WBC 5.1  HGB 11.5*  HCT 36.2  PLT 201   ------------------------------------------------------------------------------------------------------------------  Chemistries  Recent Labs  Lab 04/26/19 0222 04/27/19 0432  NA 136 137  K 5.2* 4.9  CL 102 103  CO2 26 27  GLUCOSE 183* 188*  BUN 22 39*  CREATININE 1.17* 1.60*  CALCIUM 8.1* 8.3*  AST 28  --   ALT 16  --   ALKPHOS 51  --   BILITOT 0.7  --    ------------------------------------------------------------------------------------------------------------------  Cardiac Enzymes No results for input(s): TROPONINI in the last 168 hours. ------------------------------------------------------------------------------------------------------------------  RADIOLOGY:  Dg Chest Port 1 View  Result Date: 04/27/2019 CLINICAL DATA:  Acute respiratory failure EXAM: PORTABLE CHEST 1 VIEW COMPARISON:  04/26/2019 FINDINGS: Cardiac shadow is stable. Aortic calcifications are again seen. The lungs are well aerated bilaterally. Mild interstitial edema is again identified although the vascular congestion has improved somewhat in the interval from the prior exam. No bony abnormality is noted. IMPRESSION: Slight improvement in the degree of vascular congestion and edema. Electronically Signed   By: Inez Catalina M.D.   On: 04/27/2019 03:47   Dg Chest Portable 1 View  Result Date: 04/26/2019 CLINICAL DATA:  Shortness of breath EXAM: PORTABLE CHEST 1 VIEW COMPARISON:  11/23/2017 FINDINGS: Cardiac shadow is stable. Aortic calcifications are noted. The lungs are hyperinflated consistent with COPD. No focal infiltrate is seen. Interstitial edema is noted particularly on the right increased  from the prior exam. IMPRESSION: COPD and  mild interstitial edema. Electronically Signed   By: Inez Catalina M.D.   On: 04/26/2019 02:42    EKG:   Orders placed or performed during the hospital encounter of 04/26/19  . ED EKG  . ED EKG  . EKG 12-Lead  . EKG 12-Lead  . EKG 12-Lead  . EKG 12-Lead  . EKG 12-Lead    ASSESSMENT AND PLAN:   83 year old female with acute on chronic respiratory failure secondary to COPD exacerbation, patient initially required BiPAP but now improved.  Back to 2 L of oxygen.  Continue bronchodilators, IV steroids, IV antibiotics, patient oxygenating better but she feels very tired even with little exertion.  Physical therapy recommend home health physical therapy and 3 of 3 in 1 commode. #2. acute on chronic diastolic heart failure, continue IV diuretics, EF 55%, follows up with Dr. Ubaldo Glassing. 3.  COVID-19 test is negative.  #4 severe COPD exacerbation, patient is improving continue IV steroids. 4.  Acute renal failure due to dehydration, ATN,.  Follow renal function closely . 5. UTI, continue IV antibiotics, urine culture showed mixed bacteria and likely contamination.  Patient has no dysuria now. #6-slightly elevated troponins now likely due to demand ischemia.  All the records are reviewed and case discussed with Care Management/Social Workerr. Management plans discussed with the patient, family and they are in agreement.  CODE STATUS: Code TOTAL TIME TAKING CARE OF THIS PATIENT: 38 minutes.  Than 50% time spent in counseling, coordination of care.  POSSIBLE D/C IN 1-2DAYS, DEPENDING ON CLINICAL CONDITION.   [name] M.D on 04/27/2019 at 3:49 PM  Between 7am to 6pm - Pager - [phone]  After 6pm go to [email or web address] - password [email or web address] [facility]  Office  [phone]  CC: Primary care physician; Tracie Harrier, MD   Note: This dictation was prepared with Dragon dictation along with smaller phrase technology. Any transcriptional errors that result from this  process are unintentional.

## 2019-04-27 NOTE — Progress Notes (Signed)
Name: Catherine Landry MRN: [id number] DOB: July 16, 1936    ADMISSION DATE: 04/26/2019 CONSULTATION DATE: 04/26/2019  REFERRING MD : Gardiner Barefoot, NP   CHIEF COMPLAINT: Shortness of breath   HISTORY OF PRESENT ILLNESS:   83 y.o. female presenting to ED in acute on chronic hypoxic respiratory failure with hx significant for COPD and supplemental O2 use. CXR shows mild pulmonary edema. Requiring BiPAP. UTI noted on UA.   EVENTS OVERNIGHT: Patients SOB and WOB have improved, she was able to be weaned off BiPAP yesterday and remained on 4L [place]. Today she on 2L [place], her baseline. She reports improvement in her SOB.   SIGNIFICANT EVENTS: 04/26/19: BiPAP required  04/26/19: Echo - EF 50% with left diastolic dysfunction 05/24/78: CXR- Insterstitial edema   04/27/19: CXR- slight improvement in vascular congestion and edema   PAST MEDICAL HISTORY :   has a past medical history of Arthritis, Asthma, Bladder cancer ([place]) (2015), Cancer ([place]) (2001), COPD (chronic obstructive pulmonary disease) ([place]), GERD (gastroesophageal reflux disease), Gout, History of bladder cancer, History of kidney cancer, Hypertension, Raynaud's disease, and Renal insufficiency.  has a past surgical history that includes Back surgery; Nephrectomy; Appendectomy; Cataract extraction; Colonoscopy with propofol (N/A, 10/15/2016); Esophagogastroduodenoscopy (egd) with propofol (N/A, 10/15/2016); and Eye surgery (Left, 12/2018). No Known Allergies   REVIEW OF SYSTEMS:   Review of Systems  Constitutional: Positive for malaise/fatigue. Negative for chills and fever.  HENT: Positive for sore throat.   Respiratory: Positive for cough and shortness of breath (improving ). Negative for sputum production and wheezing.   Cardiovascular: Negative for chest pain and leg swelling.  Gastrointestinal: Negative for abdominal pain and constipation.  Genitourinary: Negative for dysuria.       Incontinence, chronic and unchanged   Musculoskeletal:       Costochondritis   Neurological: Positive for weakness.   VITAL SIGNS: Temp:  [97.7 F (36.5 C)-98.8 F (37.1 C)] 98.1 F (36.7 C) (08/06 0735) Pulse Rate:  [54-107] 80 (08/06 0800) Resp:  [17-26] 21 (08/06 0800) BP: (88-111)/(52-87) 105/62 (08/06 0800) SpO2:  [91 %-100 %] 98 % (08/06 0845)   I/O last 3 completed shifts: In: 220 [P.O.:120; IV Piggyback:100] Out: 375 [Urine:375] No intake/output data recorded.   SpO2: 98 % O2 Flow Rate (L/min): 2 L/min FiO2 (%): 35 %   Physical Examination:  GENERAL: in no acute distress, resting comfortably seated up in bed  HEAD: Normocephalic, atraumatic.  EYES: Pupils equal, round, reactive to light.  No scleral icterus.  MOUTH: Moist mucosal membrane. NECK: Supple. No JVD.  PULMONARY: +rhonchi, +wheezing but improving  CARDIOVASCULAR: S1 and S2. Regular rate and rhythm. No murmurs, rubs, or gallops.  GASTROINTESTINAL: Soft, nontender, -distended. No masses. Positive bowel sounds. No hepatosplenomegaly.  MUSCULOSKELETAL: No swelling, clubbing, or edema.  NEUROLOGIC: alert and oriented x 3  SKIN:intact,warm,dry  I personally reviewed lab work that was obtained in last 24 hrs.  BMP Latest Ref Rng & Units 04/27/2019 04/26/2019 11/27/2017  Glucose 70 - 99 mg/dL 188(H) 183(H) 111(H)  BUN 8 - 23 mg/dL 39(H) 22 24(H)  Creatinine 0.44 - 1.00 mg/dL 1.60(H) 1.17(H) 0.96  Sodium 135 - 145 mmol/L 137 136 138  Potassium 3.5 - 5.1 mmol/L 4.9 5.2(H) 4.5  Chloride 98 - 111 mmol/L 103 102 101  CO2 22 - 32 mmol/L 27 26 29   Calcium 8.9 - 10.3 mg/dL 8.3(L) 8.1(L) 8.7(L)   BNP    Component Value Date/Time   BNP 1,095.0 (H) 04/27/2019 0240    MEDICATIONS:  I have reviewed all medications and confirmed regimen as documented   CULTURE RESULTS   Recent Results (from the past 240 hour(s))  SARS Coronavirus 2 [facility] order, Performed in [facility] hospital lab) Nasopharyngeal     Status: None   Collection Time: 04/26/19  2:22 AM   Specimen:  Nasopharyngeal  Result Value Ref Range Status   SARS Coronavirus 2 NEGATIVE NEGATIVE Final    Comment: (NOTE) If result is NEGATIVE SARS-CoV-2 target nucleic acids are NOT DETECTED. The SARS-CoV-2 RNA is generally detectable in upper and lower  respiratory specimens during the acute phase of infection. The lowest  concentration of SARS-CoV-2 viral copies this assay can detect is 250  copies / mL. A negative result does not preclude SARS-CoV-2 infection  and should not be used as the sole basis for treatment or other  patient management decisions.  A negative result may occur with  improper specimen collection / handling, submission of specimen other  than nasopharyngeal swab, presence of viral mutation(s) within the  areas targeted by this assay, and inadequate number of viral copies  (<250 copies / mL). A negative result must be combined with clinical  observations, patient history, and epidemiological information. If result is POSITIVE SARS-CoV-2 target nucleic acids are DETECTED. The SARS-CoV-2 RNA is generally detectable in upper and lower  respiratory specimens dur ing the acute phase of infection.  Positive  results are indicative of active infection with SARS-CoV-2.  Clinical  correlation with patient history and other diagnostic information is  necessary to determine patient infection status.  Positive results do  not rule out bacterial infection or co-infection with other viruses. If result is PRESUMPTIVE POSTIVE SARS-CoV-2 nucleic acids MAY BE PRESENT.   A presumptive positive result was obtained on the submitted specimen  and confirmed on repeat testing.  While 2019 novel coronavirus  (SARS-CoV-2) nucleic acids may be present in the submitted sample  additional confirmatory testing may be necessary for epidemiological  and / or clinical management purposes  to differentiate between  SARS-CoV-2 and other Sarbecovirus currently known to infect humans.  If clinically  indicated additional testing with an alternate test  methodology [phone]) is advised. The SARS-CoV-2 RNA is generally  detectable in upper and lower respiratory sp ecimens during the acute  phase of infection. The expected result is Negative. Fact Sheet for Patients:  StrictlyIdeas.no Fact Sheet for Healthcare Providers: BankingDealers.co.za This test is not yet approved or cleared by the [place] FDA and has been authorized for detection and/or diagnosis of SARS-CoV-2 by FDA under an Emergency Use Authorization (EUA).  This EUA will remain in effect (meaning this test can be used) for the duration of the COVID-19 declaration under Section 564(b)(1) of the Act, 21 U.S.C. section 360bbb-3(b)(1), unless the authorization is terminated or revoked sooner. Performed at [facility], [address] [place], [place] [address]   MRSA PCR Screening     Status: None   Collection Time: 04/26/19  5:46 AM   Specimen: Nasal Mucosa; Nasopharyngeal  Result Value Ref Range Status   MRSA by PCR NEGATIVE NEGATIVE Final    Comment:        The GeneXpert MRSA Assay (FDA approved for NASAL specimens only), is one component of a comprehensive MRSA colonization surveillance program. It is not intended to diagnose MRSA infection nor to guide or monitor treatment for MRSA infections. Performed at [facility], [address] [place], [place] [address]  IMAGING    Dg Chest Port 1 View  Result Date: 04/27/2019 CLINICAL DATA:  Acute respiratory failure EXAM: PORTABLE CHEST 1 VIEW COMPARISON:  04/26/2019 FINDINGS: Cardiac shadow is stable. Aortic calcifications are again seen. The lungs are well aerated bilaterally. Mild interstitial edema is again identified although the vascular congestion has improved somewhat in the interval from the prior exam. No bony abnormality is noted. IMPRESSION: Slight improvement in the  degree of vascular congestion and edema. Electronically Signed   By: Inez Catalina M.D.   On: 04/27/2019 03:47     The CXR was Independently Reviewed By Me Today CXR reviewed-improved infiltrates     External Urinary Catheter continued, requirement due to   Reason to continue External Urinary Catheter strict Intake/Output monitoring for hemodynamic instability     ASSESSMENT AND PLAN SYNOPSIS  83 y.o female with improved acute on chronic hypoxic respiratory failure in the setting of COPD exacerbation with acute diastolic heart failure  no longer requiring BiPAP.   Severe ACUTE on chronic Hypoxic Respiratory Failure -Improving -continue Bronchodilator Therapy -Back to baseline supplemental O2 use, 2L [place]   Diastolic CARDIAC FAILURE- EF 50% -Cardiology consulted -Lasix given yesterday   - Held today due to renal function  -Elevated cardiac enzymes likely due to demand secondary to respiratory failure per cardiology.  -follow up cardiology recs   SEVERE COPD EXACERBATION -Improving -continue IV steroids as prescribed -continue NEB THERAPY as prescribed -morphine as needed -wean fio2 as needed and tolerated   ACUTE on chronic KIDNEY INJURY/Renal Failure -follow chem 7 -follow UO -continue Foley Catheter-assess need -Avoid nephrotoxic agents   MSK -Reproducible chest pain, likely costochondritis  -Acetaminophen PRN  ID -continue IV abx as prescribed for UTI -Urine cultures completed, with multiple bacterial morphotypes present, likely due to contamination.   GI GI PROPHYLAXIS as indicated  NUTRITIONAL STATUS DIET-->Heart healthy/ carb modified Constipation protocol as indicated  ENDO - will use ICU hypoglycemic\Hyperglycemia protocol if needed  ELECTROLYTES -follow labs as needed -replace as needed -pharmacy consultation and following  PT/OT consulted   DVT/GI PRX ordered TRANSFUSIONS AS NEEDED MONITOR FSBS ASSESS the need for LABS      Patient improving, back to baseline for supplemental oxygen use, plan to transfer to med-surg floor.     Corrin Parker, M.D.  [facility]  Medical Director [place] Director [facility] Cardio-Pulmonary Department

## 2019-04-27 NOTE — Progress Notes (Signed)
PHARMACIST - PHYSICIAN COMMUNICATION  CONCERNING:  Enoxaparin (Lovenox) for DVT Prophylaxis    RECOMMENDATION: Patient was prescribed enoxaprin 40mg  q24 hours for VTE prophylaxis.   Filed Weights   04/26/19 0210  Weight: 133 lb (60.3 kg)    Body mass index is 24.33 kg/m.  Estimated Creatinine Clearance: 23.6 mL/min (A) (by C-G formula based on SCr of 1.6 mg/dL (H)).   Patient is candidate for enoxaparin 30mg  every 24 hours based on CrCl <57ml/min   DESCRIPTION: Pharmacy has adjusted enoxaparin dose per [facility] policy.   Patient is now receiving enoxaparin 30mg  every 24 hours.  Catherine Landry, PharmD Clinical Pharmacist  04/27/2019 5:52 AM

## 2019-04-28 MED ORDER — TRAZODONE HCL 50 MG PO TABS
50.0000 mg | ORAL_TABLET | Freq: Every evening | ORAL | Status: DC | PRN
  Administered 2019-04-28 – 2019-04-29 (×2): 50 mg via ORAL
  Filled 2019-04-28 (×2): qty 1

## 2019-04-28 MED ORDER — GLUCERNA SHAKE PO LIQD
237.0000 mL | Freq: Three times a day (TID) | ORAL | Status: DC
  Administered 2019-04-28 – 2019-04-29 (×2): 237 mL via ORAL

## 2019-04-28 NOTE — Progress Notes (Signed)
OT Cancellation Note  Patient Details Name: Catherine Landry MRN: [id number] DOB: 1936/09/13   Cancelled Treatment:    Reason Eval/Treat Not Completed: Patient at procedure or test/ unavailable. OT attempted to see this pt for tx on this date. Upon arrival to room, pt noted to be receiving nebulizer treatment. Per guidelines, pt will need to wait at least 70 min after receiving nebulizer treatment before working with therapy in room. Will follow acutely and re-attempt at a later time/date as available and pt medically appropriate for OT tx.   Shara Blazing, M.S., OTR/L Ascom: [phone] 04/28/19, 2:27 PM

## 2019-04-28 NOTE — Progress Notes (Signed)
Occupational Therapy Treatment Patient Details Name: Catherine Landry MRN: [id number] DOB: [birth year] Today's Date: 04/28/2019    History of present illness Pt is an 83 year old female admitted with acute on chronic hypoxic respiratory failure in the setting of COPD exacerbation and mild pulmonary edema requiring BiPAP.  Assessment includes: Acute on Chronic Hypoxic Respiratory Failure secondary to COPD Exacerbation & Mild pulmonary edema, Chest pain with elevated troponin (demand ischemia secondary to respiratory failure per cardiology), UTI, and anemia without s/sx of bleeding.   OT comments  Pt seen for OT treatment on this date. Upon arrival to room pt awake/alert seated at EOB. Pt agreeable to OT tx and stated she is eager to sit up in chair on this date. Pt instructed in ECS, particularly use of PLB during functional mobility/ADL tasks. Pt assisted with doffing/donning mesh undergarment on this date to remove external catheter in preparation for functional activity. Upon initial standing attempt pt stated she was too fatigued to pull up undergarment. With rest break and cues for PLB, pt demonstrated improved endurance and activity tolerance upon subsequent STS transfers during this session. Pt ambulated ~15' around her bed to the room recliner. Upon sitting back down pt noted to have a HR of 101 and SpO2 of 87. Both improved with rest and cues for PLB. Pt left in room recliner with last HR reading of 80 and SpO2 sat of 93. External catheter replaced 2/2 pt concerns for bladder incontinence. RN notified pt may need external catheter placement checked. Pt making good progress toward goals and continues to benefit from skilled OT services to maximize return to PLOF and minimize risk of future falls, injury, caregiver burden, and readmission. Will continue to follow POC as written. Discharge recommendation remains appropriate.    Follow Up Recommendations  Home health OT    Equipment  Recommendations  3 in 1 bedside commode;Other (comment)([place])    Recommendations for Other Services      Precautions / Restrictions Precautions Precautions: Fall Restrictions Weight Bearing Restrictions: No       Mobility Bed Mobility Overal bed mobility: Needs Assistance             General bed mobility comments: Deferred. Pt seated EOB at start of session, in recliner at end of session.  Transfers Overall transfer level: Needs assistance Equipment used: Rolling walker (2 wheeled) Transfers: Sit to/from Stand Sit to Stand: Min guard         General transfer comment: Min verbal cues for hand placement. Pt attempts to pull up from RW on this date. Demonstrated improved safety awareness with additional trials. STS x5 on this date for mgt of external catheter before and after functional mobility.    Balance Overall balance assessment: Needs assistance Sitting-balance support: Feet supported Sitting balance-Leahy Scale: Good Sitting balance - Comments: Steady reaching within BOS. Good dynamic seated balance and weight shift during LB dressing task.   Standing balance support: Bilateral upper extremity supported;During functional activity Standing balance-Leahy Scale: Fair                             ADL either performed or assessed with clinical judgement   ADL Overall ADL's : Needs assistance/impaired                     Lower Body Dressing: Sit to/from stand;Minimal assistance Lower Body Dressing Details (indicate cue type and reason): Pt required min assist to don/doff mesh underwear  on this date. Pt able to complete STS transfer with min VC's for technique and Cues to PLB during functional activity. Pt demonstrated improved functional independence to pull underwear up than to pull down on this date.             Functional mobility during ADLs: Rolling walker;Supervision/safety;Min guard       Vision Baseline Vision/History:  Wears glasses Wears Glasses: Reading only Patient Visual Report: No change from baseline     Perception     Praxis      Cognition Arousal/Alertness: Awake/alert Behavior During Therapy: WFL for tasks assessed/performed Overall Cognitive Status: Within Functional Limits for tasks assessed                                          Exercises Other Exercises Other Exercises: Additional review of ECS during functional activity. Pt assist with external catheter mgt and LB dressing on this date. Required consistent cues to implement PLB strategies during functional mobility.   Shoulder Instructions       General Comments On 2L, HR up to 101 after amb to room recliner and SpO2 dropped to 87-88. HR and SpO2 improved with rest and cues to PLB. Pt observed to hold her breath during functional mobility and requried consistent cues in order to utilize ECS.    Pertinent Vitals/ Pain       Pain Assessment: No/denies pain  Home Living                                          Prior Functioning/Environment              Frequency  Min 1X/week        Progress Toward Goals  OT Goals(current goals can now be found in the care plan section)  Progress towards OT goals: Progressing toward goals  Acute Rehab OT Goals Patient Stated Goal: To get back to walking better OT Goal Formulation: With patient Time For Goal Achievement: 05/10/19 Potential to Achieve Goals: Good  Plan Discharge plan remains appropriate;Frequency remains appropriate    Co-evaluation                 AM-PAC OT "6 Clicks" Daily Activity     Outcome Measure   Help from another person eating meals?: None Help from another person taking care of personal grooming?: None Help from another person toileting, which includes using toliet, bedpan, or urinal?: A Little Help from another person bathing (including washing, rinsing, drying)?: A Little Help from another person to  put on and taking off regular upper body clothing?: A Little Help from another person to put on and taking off regular lower body clothing?: A Little 6 Click Score: 20    End of Session Equipment Utilized During Treatment: Gait belt;Rolling walker  OT Visit Diagnosis: Other abnormalities of gait and mobility (R26.89);Muscle weakness (generalized) (M62.81)   Activity Tolerance Patient tolerated treatment well   Patient Left in bed;with call bell/phone within reach;with chair alarm set   Nurse Communication Other (comment)(Notified RN External catheter removed for functional mobility and replaced by this OT. Request RN or NT check placement.)        Time: 1740-8144 OT Time Calculation (min): 32 min  Charges: OT General Charges $OT Visit: 1 Visit  OT Treatments $Self Care/Home Management : 23-37 mins  Shara Blazing, M.S., OTR/L Ascom: [phone] 04/28/19, 4:06 PM

## 2019-04-28 NOTE — TOC Initial Note (Addendum)
Transition of Care [facility]) - Initial/Assessment Note    Patient Details  Name: Catherine Landry MRN: [id number] Date of Birth: September 03, 1936  Transition of Care [facility]) CM/SW Contact:    Katrina Stack, RN Phone Number: 04/28/2019, 8:09 PM  Clinical Narrative:                patient admitted with heart failure. .  Patient  is the one that supports herself and husband who has dementia.  She drives to appointments.  She and her husband together do not have children but husband has two living sons- one disabled with multiple health issues and one that is out of the picture.  Patient's niece takes care of patient's 64 year old sister. Her daughter in law Arcola Jansky provides some support.  She becomes tearful when discussing discharge options. She wants to go home.  Stresses she and her husband want to stay at home together.  Says she has filled out some applications for in home care services with [facility]- stresses it is not for assisted living.Judeth [place] with [facility] and informed  there is an inhome care service that can be offered to the public . Patient is more than willing to have private pay in home support to assist  being able to remain in her home. CM had been informed through a third party that family wanted patient and husband to go to assisted living and patient was "for it."  Patient does not wish to pursue facility. Has access to scales and is agreeable to home health. [facility] preference is [facility]. [facility] called to and accepted for RN PT OT Aide SW. Patient did not give CM to discuss disposition with other family members    Expected Discharge Plan: [place]     Patient Goals and CMS Choice   CMS Medicare.gov Compare Post Acute Care list provided to:: Patient Choice offered to / list presented to : Patient  Expected Discharge Plan and Services Expected Discharge Plan: [place] Choice: [place]  arrangements for the past 2 months: Single Family Home                           HH Arranged: RN, PT, OT, Social Work [employer] Agency: [place] ([place]) Date [place]: 04/28/19   Representative spoke with at [place]: Corene Cornea  Prior Living Arrangements/Services Living arrangements for the past 2 months: [place] Lives with:: Spouse Patient language and need for interpreter reviewed:: Yes Do you feel safe going back to the place where you live?: Yes        Care giver support system in place?: Yes (comment) Current home services: DME(oxygen through [place]) Criminal Activity/Legal Involvement Pertinent to Current Situation/Hospitalization: No - Comment as needed  Activities of Daily Living Home Assistive Devices/Equipment: None ADL Screening (condition at time of admission) Patient's cognitive ability adequate to safely complete daily activities?: Yes Is the patient deaf or have difficulty hearing?: No Does the patient have difficulty seeing, even when wearing glasses/contacts?: No Does the patient have difficulty concentrating, remembering, or making decisions?: No Patient able to express need for assistance with ADLs?: Yes Does the patient have difficulty dressing or bathing?: No Independently performs ADLs?: Yes (appropriate for developmental age) Does the patient have difficulty walking or climbing stairs?: No Weakness of Legs: None Weakness of Arms/Hands: None  Permission Sought/Granted Permission sought to share information with : Case Manager Permission granted to share information with : Yes, Verbal Permission Granted     Permission granted to share info w AGENCY: [facility]        Emotional Assessment Appearance:: Appears older than stated age     Orientation: : Oriented to Self, Oriented to Place, Oriented to  Time, Oriented to Situation, Fluctuating Orientation (Suspected and/or reported Sundowners) Alcohol / Substance Use: Not  Applicable Psych Involvement: No (comment)  Admission diagnosis:  Elevated troponin [R79.89] Acute on chronic respiratory failure with hypoxia ([place]) [J96.21] Patient Active Problem List   Diagnosis Date Noted  . Respiratory failure, acute ([place]) 04/26/2019  . Elevated troponin 04/26/2019  . Personal history of bladder cancer 03/16/2018  . Acute on chronic respiratory failure with hypoxia ([place]) 11/23/2017  . UTI (lower urinary tract infection) 04/25/2016  . COPD exacerbation ([place]) 10/06/2015   PCP:  Tracie Harrier, MD Pharmacy:   [id number] [place] [address] [place] [address] Phone: [phone] Fax: [phone]  [id number] [place], [place] [address] Phone: [phone] Fax: [phone]     Social Determinants of Health (SDOH) Interventions    Readmission Risk Interventions No flowsheet data found.

## 2019-04-28 NOTE — Progress Notes (Signed)
Physical Therapy Treatment Patient Details Name: Catherine Landry MRN: [id number] DOB: December 24, 1935 Today's Date: 04/28/2019    History of Present Illness Pt is an 83 year old female admitted with acute on chronic hypoxic respiratory failure in the setting of COPD exacerbation and mild pulmonary edema requiring BiPAP.  Assessment includes: Acute on Chronic Hypoxic Respiratory Failure secondary to COPD Exacerbation & Mild pulmonary edema, Chest pain with elevated troponin (demand ischemia secondary to respiratory failure per cardiology), UTI, and anemia without s/sx of bleeding.    PT Comments    Pt agreeable to PT; denies pain. Pt notes shortness of breath than is worse than baseline and difficulty with any activity causing worsening breathing and O2 saturation. Educated on Energy conservation approach to exercise and activity and performed seated exercises with rest periods between sets of 10. O2 saturation between 87% and 92% and HR between 94 and 108 bpm throughout. Encouraged listening to body signals as well as O2 saturation/HR parameters for beginning next set of exercise. Educated pt on use of assistive device for Energy conservation with ambulation/activities around the home and to support use of accessory breathing muscles as well as portable O2 in the home. Pt fatigued post 2 stand marching sets. No further intervention at this time. Pt hoping to receive PT again tomorrow if possible. Continue PT to progress strength and endurance and functional mobility tolerance with safe O2 saturation levels.   Follow Up Recommendations  Home health PT     Equipment Recommendations  Other (comment);Rolling walker with 5" wheels(pt has rollator at home, would be helpful for seat/carrying)    Recommendations for Other Services       Precautions / Restrictions Precautions Precautions: Fall Restrictions Weight Bearing Restrictions: No    Mobility  Bed Mobility Overal bed mobility: Needs  Assistance             General bed mobility comments: Not tested; up in chair  Transfers Overall transfer level: Needs assistance Equipment used: None Transfers: Sit to/from Stand Sit to Stand: Min guard         General transfer comment: Slow to rise; but steady. Feels winded with stand  Ambulation/Gait                 Stairs             Wheelchair Mobility    Modified Rankin (Stroke Patients Only)       Balance Overall balance assessment: Needs assistance Sitting-balance support: Feet supported Sitting balance-Leahy Scale: Good Sitting balance - Comments: Steady reaching within BOS. Good dynamic seated balance and weight shift during LB dressing task.   Standing balance support: Single extremity supported Standing balance-Leahy Scale: Fair                              Cognition Arousal/Alertness: Awake/alert Behavior During Therapy: WFL for tasks assessed/performed Overall Cognitive Status: Within Functional Limits for tasks assessed                                        Exercises General Exercises - Lower Extremity Long Arc Quad: AROM;Both;10 reps;Seated(2 sets) Hip ABduction/ADduction: AROM;Both;10 reps;Seated(2 sets) Hip Flexion/Marching: AROM;Both;10 reps;Seated;Standing(2 sets in each position) Toe Raises: AROM;Both;20 reps;Seated Heel Raises: AROM;Both;20 reps;Seated Other Exercises Other Exercises: pursed lip breathing between sets of 10 exercises for O2 saturation management Other Exercises: Additional review  of ECS during functional activity. Pt assist with external catheter mgt and LB dressing on this date. Required consistent cues to implement PLB strategies during functional mobility.    General Comments General comments (skin integrity, edema, etc.): On 2L, HR up to 101 after amb to room recliner and SpO2 dropped to 87-88. HR and SpO2 improved with rest and cues to PLB. Pt observed to hold her breath  during functional mobility and requried consistent cues in order to utilize ECS.      Pertinent Vitals/Pain Pain Assessment: No/denies pain    Home Living                      Prior Function            PT Goals (current goals can now be found in the care plan section) Acute Rehab PT Goals Patient Stated Goal: To get back to walking better Progress towards PT goals: Progressing toward goals    Frequency    Min 2X/week      PT Plan Current plan remains appropriate    Co-evaluation              AM-PAC PT "6 Clicks" Mobility   Outcome Measure  Help needed turning from your back to your side while in a flat bed without using bedrails?: A Little Help needed moving from lying on your back to sitting on the side of a flat bed without using bedrails?: A Little Help needed moving to and from a bed to a chair (including a wheelchair)?: A Little Help needed standing up from a chair using your arms (e.g., wheelchair or bedside chair)?: A Little Help needed to walk in hospital room?: A Little Help needed climbing 3-5 steps with a railing? : A Lot 6 Click Score: 17    End of Session Equipment Utilized During Treatment: Oxygen;Gait belt Activity Tolerance: Other (comment);Patient limited by fatigue(O2 saturation ) Patient left: in chair;with call bell/phone within reach;with chair alarm set   PT Visit Diagnosis: Difficulty in walking, not elsewhere classified (R26.2);Muscle weakness (generalized) (M62.81)     Time: 0981-1914 PT Time Calculation (min) (ACUTE ONLY): 26 min  Charges:  $Therapeutic Exercise: 23-37 mins                      Larae Grooms, PTA 04/28/2019, 5:07 PM

## 2019-04-28 NOTE — Care Management Important Message (Signed)
Important Message  Patient Details  Name: Catherine Landry MRN: [id number] Date of Birth: Apr 13, 1936   Medicare Important Message Given:  Yes     [name] 04/28/2019, 2:55 PM

## 2019-04-28 NOTE — Progress Notes (Addendum)
Pt is requesting something to help with sleep. Page prime and talked to Dr. Jannifer Franklin and states will place order. Will continue to monitor.  Update 0212: Pt states " Im anxious right now and need something to help me calm down". Notify prime. Will continue to monitor.  Update : Dr. Marcille Blanco states will Place order. Will continue to monitor.

## 2019-04-28 NOTE — Plan of Care (Signed)
Nutrition Education Note  RD consulted for nutrition education regarding CHF.  83 y/o female admitted with CHF and COPD  RD provided "Low Sodium Nutrition Therapy" handout from the [employer]. Reviewed patient's dietary recall. Provided examples on ways to decrease sodium intake in diet. Discouraged intake of processed foods and use of salt shaker. Encouraged fresh fruits and vegetables as well as whole grain sources of carbohydrates to maximize fiber intake.   RD discussed why it is important for patient to adhere to diet recommendations, and emphasized the role of fluids, foods to avoid, and importance of weighing self daily. Teach back method used.  Expect good compliance.  Body mass index is 25.41 kg/m. Pt meets criteria for overweight based on current BMI.  RD following this patient   Catherine Distance MS, RD, LDN Pager #- [phone] Office#- [phone] After Hours Pager: [phone]

## 2019-04-29 LAB — CBC
HCT: 30.6 % — ABNORMAL LOW (ref 36.0–46.0)
Hemoglobin: 9.9 g/dL — ABNORMAL LOW (ref 12.0–15.0)
MCH: 32.2 pg (ref 26.0–34.0)
MCHC: 32.4 g/dL (ref 30.0–36.0)
MCV: 99.7 fL (ref 80.0–100.0)
Platelets: 193 10*3/uL (ref 150–400)
RBC: 3.07 MIL/uL — ABNORMAL LOW (ref 3.87–5.11)
RDW: 13.9 % (ref 11.5–15.5)
WBC: 6.6 10*3/uL (ref 4.0–10.5)
nRBC: 0 % (ref 0.0–0.2)

## 2019-04-29 MED ORDER — ALUM & MAG HYDROXIDE-SIMETH 200-200-20 MG/5ML PO SUSP
30.0000 mL | Freq: Once | ORAL | Status: AC
  Administered 2019-04-29: 30 mL via ORAL
  Filled 2019-04-29: qty 30

## 2019-04-29 MED ORDER — LORAZEPAM 1 MG PO TABS
1.0000 mg | ORAL_TABLET | Freq: Every day | ORAL | Status: DC
  Administered 2019-04-29 (×2): 1 mg via ORAL
  Filled 2019-04-29 (×2): qty 1

## 2019-04-29 NOTE — Plan of Care (Signed)
  Problem: Education: Goal: Ability to demonstrate management of disease process will improve Outcome: Progressing Goal: Ability to verbalize understanding of medication therapies will improve Outcome: Progressing   Problem: Activity: Goal: Capacity to carry out activities will improve Outcome: Progressing   Problem: Cardiac: Goal: Ability to achieve and maintain adequate cardiopulmonary perfusion will improve Outcome: Progressing   

## 2019-04-29 NOTE — Progress Notes (Signed)
[facility] [place] NAME: Catherine Landry    MR#:  [id number]  DATE OF BIRTH:  1936-07-12  SUBJECTIVE:   Pt. Presented to the hospital due to shortness of breath and noted to COPD exacerbation.  Patient still complaining of some exertional dyspnea but improved since yesterday.  Denies any fever or cough, chest pain.  Patient's husband is at bedside.  REVIEW OF SYSTEMS:    Review of Systems  Constitutional: Negative for chills and fever.  HENT: Negative for congestion and tinnitus.   Eyes: Negative for blurred vision and double vision.  Respiratory: Positive for shortness of breath. Negative for cough and wheezing.   Cardiovascular: Negative for chest pain, orthopnea and PND.  Gastrointestinal: Negative for abdominal pain, diarrhea, nausea and vomiting.  Genitourinary: Negative for dysuria and hematuria.  Neurological: Negative for dizziness, sensory change and focal weakness.  All other systems reviewed and are negative.   Nutrition: Heart Healthy/Carb control Tolerating Diet: Yes Tolerating PT: Ambulatory  DRUG ALLERGIES:  No Known Allergies  VITALS:  Blood pressure 134/76, pulse (!) 103, temperature 97.7 F (36.5 C), temperature source Oral, resp. rate 18, height 5\' 2"  (1.575 m), weight 64.3 kg, SpO2 91 %.  PHYSICAL EXAMINATION:   Physical Exam  GENERAL:  83 y.o.-year-old patient sitting in bedside commode in no acute distress.  EYES: Pupils equal, round, reactive to light and accommodation. No scleral icterus. Extraocular muscles intact.  HEENT: Head atraumatic, normocephalic. Oropharynx and nasopharynx clear.  NECK:  Supple, no jugular venous distention. No thyroid enlargement, no tenderness.  LUNGS: Good a/e B/l, no wheezing, rales, rhonchi. No use of accessory muscles of respiration.  CARDIOVASCULAR: S1, S2 normal. No murmurs, rubs, or gallops.  ABDOMEN: Soft, nontender, nondistended. Bowel sounds present. No  organomegaly or mass.  EXTREMITIES: No cyanosis, clubbing or edema b/l.    NEUROLOGIC: Cranial nerves II through XII are intact. No focal Motor or sensory deficits b/l.   PSYCHIATRIC: The patient is alert and oriented x 3.  SKIN: No obvious rash, lesion, or ulcer.    LABORATORY PANEL:   CBC Recent Labs  Lab 04/29/19 0511  WBC 6.6  HGB 9.9*  HCT 30.6*  PLT 193   ------------------------------------------------------------------------------------------------------------------  Chemistries  Recent Labs  Lab 04/26/19 0222 04/27/19 0432  NA 136 137  K 5.2* 4.9  CL 102 103  CO2 26 27  GLUCOSE 183* 188*  BUN 22 39*  CREATININE 1.17* 1.60*  CALCIUM 8.1* 8.3*  AST 28  --   ALT 16  --   ALKPHOS 51  --   BILITOT 0.7  --    ------------------------------------------------------------------------------------------------------------------  Cardiac Enzymes No results for input(s): TROPONINI in the last 168 hours. ------------------------------------------------------------------------------------------------------------------  RADIOLOGY:  No results found.   ASSESSMENT AND PLAN:   83 year old female with past medical history of bladder cancer, GERD, COPD, history of gout, history of renal cell carcinoma who presented to the hospital due to shortness of breath and noted to be in COPD exacerbation.  1.  Acute on chronic respiratory failure with hypoxia-secondary to COPD exacerbation. -Continue O2 supplementation, continue IV steroids, scheduled duo nebs, Pulmicort nebs. - Improving.    2.  COPD exacerbation-source of patient's worsening shortness of breath and hypoxemia. - Continue IV steroids, scheduled duo nebs, Pulmicort nebs.  Slow to improve. -Continue theophylline.  3.  GERD-continue Protonix.  4.  Essential hypertension-continue Toprol.  5.  Anxiety-continue Ativan at bedtime.  Ambulate today and if feeling better  likely discharge home tomorrow morning.    All the records are reviewed and case discussed with Care Management/Social Worker. Management plans discussed with the patient, family and they are in agreement.  CODE STATUS: Full code  DVT Prophylaxis: Lovenox  TOTAL TIME TAKING CARE OF THIS PATIENT: 30 minutes.   POSSIBLE D/C IN 1-2 DAYS, DEPENDING ON CLINICAL CONDITION.   Henreitta Leber M.D on 04/29/2019 at 4:20 PM  Between 7am to 6pm - Pager - [phone]  After 6pm go to [email or web address] - Proofreader  [facility]  Office  [phone]  CC: Primary care physician; Tracie Harrier, MD

## 2019-04-30 MED ORDER — PREDNISONE 10 MG PO TABS: ORAL_TABLET | ORAL | 0 refills | Status: DC

## 2019-04-30 MED ORDER — ALUM & MAG HYDROXIDE-SIMETH 200-200-20 MG/5ML PO SUSP
30.0000 mL | Freq: Once | ORAL | Status: AC
  Administered 2019-04-30: 30 mL via ORAL
  Filled 2019-04-30: qty 30

## 2019-04-30 NOTE — Discharge Summary (Signed)
[facility] NAME: Catherine Landry    MR#:  [id number]  DATE OF BIRTH:  July 21, 1936  DATE OF ADMISSION:  04/26/2019 ADMITTING PHYSICIAN: Christel Mormon, MD  DATE OF DISCHARGE: 04/30/2019  1:10 PM  PRIMARY CARE PHYSICIAN: Tracie Harrier, MD    ADMISSION DIAGNOSIS:  Elevated troponin [R79.89] Acute on chronic respiratory failure with hypoxia (HCC) [J96.21]  DISCHARGE DIAGNOSIS:  Active Problems:   Respiratory failure, acute (HCC)   Elevated troponin   SECONDARY DIAGNOSIS:   Past Medical History:  Diagnosis Date  . Arthritis   . Asthma   . Bladder cancer ([place]) 2015  . Cancer [facility]) 2001   kidney left   . COPD (chronic obstructive pulmonary disease) ([place])   . GERD (gastroesophageal reflux disease)   . Gout   . History of bladder cancer   . History of kidney cancer   . Hypertension   . Raynaud's disease   . Renal insufficiency     HOSPITAL COURSE:   83 year old female with past medical history of bladder cancer, GERD, COPD, history of gout, history of renal cell carcinoma who presented to the hospital due to shortness of breath and noted to be in COPD exacerbation.  1.  Acute on chronic respiratory failure with hypoxia-secondary to COPD exacerbation. -Patient was treated with IV steroids, scheduled duo nebs, Pulmicort nebs and has improved.  She is chronically on oxygen at home and will resume her home O2 at 2 to 3 L nasal cannula.  2.  COPD exacerbation-source of patient's worsening shortness of breath and hypoxemia. -Patient was treated with IV steroids, scheduled duo nebs, Pulmicort nebs.  She was also maintained on theophylline.  Her wheezing and bronchospasm is improved.  She continues to have exertional dyspnea which is chronic for her given her's COPD. -She will be discharged on oral prednisone taper, maintenance of her inhalers and her duo nebs.  Follow-up with PCP/pulmonary as an outpatient.  She also continue her  theophylline.  3.  GERD- she will continue Protonix.  4.  Essential hypertension- she will continue Toprol, Losartan  Patient was seen by physical therapy and the recommend home health services which is being arranged for the patient prior to discharge.  DISCHARGE CONDITIONS:   Stable.   CONSULTS OBTAINED:  Treatment Team:  Teodoro Spray, MD  DRUG ALLERGIES:  No Known Allergies  DISCHARGE MEDICATIONS:   Allergies as of 04/30/2019   No Known Allergies     Medication List    STOP taking these medications   azithromycin 250 MG tablet Commonly known as: ZITHROMAX   predniSONE 10 MG (21) Tbpk tablet Commonly known as: STERAPRED UNI-PAK 21 TAB Replaced by: predniSONE 10 MG tablet     TAKE these medications   acetaminophen 650 MG CR tablet Commonly known as: TYLENOL Take 650 mg by mouth every 8 (eight) hours as needed for pain.   Advair Diskus 250-50 MCG/DOSE Aepb Generic drug: Fluticasone-Salmeterol Inhale 1 puff into the lungs 2 (two) times daily. *Rinse mouth after use*   aspirin EC 81 MG tablet Take 81 mg by mouth every morning.   Calcium 600+D Plus Minerals 600-400 MG-UNIT Tabs Take 2 tablets by mouth daily.   Combivent Respimat 20-100 MCG/ACT Aers respimat Generic drug: Ipratropium-Albuterol Inhale 1 puff into the lungs 4 (four) times daily as needed for wheezing or shortness of breath.   fluticasone 50 MCG/ACT nasal spray Commonly known as: FLONASE Place 2 sprays into both nostrils daily.  hydrOXYzine 10 MG tablet Commonly known as: ATARAX/VISTARIL Take 10 mg by mouth 3 (three) times daily as needed for itching.   iron polysaccharides 150 MG capsule Commonly known as: NIFEREX Take 150 mg by mouth daily.   levalbuterol 0.63 MG/3ML nebulizer solution Commonly known as: XOPENEX Take 3 mLs (0.63 mg total) by nebulization every 6 (six) hours as needed for wheezing or shortness of breath. DX: COPD DX Code: J44.9   losartan 50 MG tablet Commonly  known as: COZAAR Take 50 mg by mouth every morning.   metoprolol succinate 25 MG 24 hr tablet Commonly known as: TOPROL-XL Take 25 mg by mouth every morning.   multivitamin with minerals Tabs tablet Take 1 tablet by mouth every morning.   pantoprazole 40 MG tablet Commonly known as: PROTONIX Take 40 mg by mouth 2 (two) times daily.   polyethylene glycol powder 17 GM/SCOOP powder Commonly known as: GLYCOLAX/MIRALAX Take 17 g by mouth daily as needed for constipation. Mix with 4 to 8 ounces of fluid.   predniSONE 10 MG tablet Commonly known as: DELTASONE Label  & dispense according to the schedule below. 5 Pills PO for 1 day then, 4 Pills PO for 1 day, 3 Pills PO for 1 day, 2 Pills PO for 1 day, 1 Pill PO for 1 days then STOP. Replaces: predniSONE 10 MG (21) Tbpk tablet   theophylline 300 MG 24 hr capsule Commonly known as: THEO-24 Take 1 capsule (300 mg total) by mouth every morning.   vitamin B-12 1000 MCG tablet Commonly known as: CYANOCOBALAMIN Take 3,000 mcg by mouth daily.   vitamin C 500 MG tablet Commonly known as: ASCORBIC ACID Take 500 mg by mouth daily.            Durable Medical Equipment  (From admission, onward)         Start     Ordered   04/30/19 1100  For home use only DME Walker rolling  Once    Question:  Patient needs a walker to treat with the following condition  Answer:  Weakness   04/30/19 1059            DISCHARGE INSTRUCTIONS:   DIET:  Cardiac diet  DISCHARGE CONDITION:  Stable  ACTIVITY:  Activity as tolerated  OXYGEN:  Home Oxygen: Yes.     Oxygen Delivery: 3-4 liters/min via Patient connected to nasal cannula oxygen  DISCHARGE LOCATION:  Home with Home Health PT, RN, Aide, Social Work, OT   If you experience worsening of your admission symptoms, develop shortness of breath, life threatening emergency, suicidal or homicidal thoughts you must seek medical attention immediately by calling 911 or calling your MD  immediately  if symptoms less severe.  You Must read complete instructions/literature along with all the possible adverse reactions/side effects for all the Medicines you take and that have been prescribed to you. Take any new Medicines after you have completely understood and accpet all the possible adverse reactions/side effects.   Please note  You were cared for by a hospitalist during your hospital stay. If you have any questions about your discharge medications or the care you received while you were in the hospital after you are discharged, you can call the unit and asked to speak with the hospitalist on call if the hospitalist that took care of you is not available. Once you are discharged, your primary care physician will handle any further medical issues. Please note that NO REFILLS for any discharge medications will be  authorized once you are discharged, as it is imperative that you return to your primary care physician (or establish a relationship with a primary care physician if you do not have one) for your aftercare needs so that they can reassess your need for medications and monitor your lab values.     Today   She still complaining of a cough.  No wheezing or bronchospasm, shortness of breath overall improved since admission.  Will discharge home today.  VITAL SIGNS:  Blood pressure 124/69, pulse 94, temperature 98.4 F (36.9 C), temperature source Oral, resp. rate 17, height 5\' 2"  (1.575 m), weight 61.1 kg, SpO2 94 %.  I/O:    Intake/Output Summary (Last 24 hours) at 04/30/2019 1505 Last data filed at 04/30/2019 1035 Gross per 24 hour  Intake 240 ml  Output 350 ml  Net -110 ml    PHYSICAL EXAMINATION:   GENERAL:  83 y.o.-year-old patient sitting in bedside commode in no acute distress.  EYES: Pupils equal, round, reactive to light and accommodation. No scleral icterus. Extraocular muscles intact.  HEENT: Head atraumatic, normocephalic. Oropharynx and nasopharynx clear.   NECK:  Supple, no jugular venous distention. No thyroid enlargement, no tenderness.  LUNGS: Good a/e B/l, no wheezing, rales, rhonchi. No use of accessory muscles of respiration.  CARDIOVASCULAR: S1, S2 normal. No murmurs, rubs, or gallops.  ABDOMEN: Soft, nontender, nondistended. Bowel sounds present. No organomegaly or mass.  EXTREMITIES: No cyanosis, clubbing or edema b/l.    NEUROLOGIC: Cranial nerves II through XII are intact. No focal Motor or sensory deficits b/l.   PSYCHIATRIC: The patient is alert and oriented x 3.  SKIN: No obvious rash, lesion, or ulcer.   DATA REVIEW:   CBC Recent Labs  Lab 04/29/19 0511  WBC 6.6  HGB 9.9*  HCT 30.6*  PLT 193    Chemistries  Recent Labs  Lab 04/26/19 0222 04/27/19 0432  NA 136 137  K 5.2* 4.9  CL 102 103  CO2 26 27  GLUCOSE 183* 188*  BUN 22 39*  CREATININE 1.17* 1.60*  CALCIUM 8.1* 8.3*  AST 28  --   ALT 16  --   ALKPHOS 51  --   BILITOT 0.7  --     Cardiac Enzymes No results for input(s): TROPONINI in the last 168 hours.  Microbiology Results  Results for orders placed or performed during the hospital encounter of 04/26/19  SARS Coronavirus 2 [facility] order, Performed in [facility] hospital lab) Nasopharyngeal     Status: None   Collection Time: 04/26/19  2:22 AM   Specimen: Nasopharyngeal  Result Value Ref Range Status   SARS Coronavirus 2 NEGATIVE NEGATIVE Final    Comment: (NOTE) If result is NEGATIVE SARS-CoV-2 target nucleic acids are NOT DETECTED. The SARS-CoV-2 RNA is generally detectable in upper and lower  respiratory specimens during the acute phase of infection. The lowest  concentration of SARS-CoV-2 viral copies this assay can detect is 250  copies / mL. A negative result does not preclude SARS-CoV-2 infection  and should not be used as the sole basis for treatment or other  patient management decisions.  A negative result may occur with  improper specimen collection / handling, submission of  specimen other  than nasopharyngeal swab, presence of viral mutation(s) within the  areas targeted by this assay, and inadequate number of viral copies  (<250 copies / mL). A negative result must be combined with clinical  observations, patient history, and epidemiological information. If result is  POSITIVE SARS-CoV-2 target nucleic acids are DETECTED. The SARS-CoV-2 RNA is generally detectable in upper and lower  respiratory specimens dur ing the acute phase of infection.  Positive  results are indicative of active infection with SARS-CoV-2.  Clinical  correlation with patient history and other diagnostic information is  necessary to determine patient infection status.  Positive results do  not rule out bacterial infection or co-infection with other viruses. If result is PRESUMPTIVE POSTIVE SARS-CoV-2 nucleic acids MAY BE PRESENT.   A presumptive positive result was obtained on the submitted specimen  and confirmed on repeat testing.  While 2019 novel coronavirus  (SARS-CoV-2) nucleic acids may be present in the submitted sample  additional confirmatory testing may be necessary for epidemiological  and / or clinical management purposes  to differentiate between  SARS-CoV-2 and other Sarbecovirus currently known to infect humans.  If clinically indicated additional testing with an alternate test  methodology [phone]) is advised. The SARS-CoV-2 RNA is generally  detectable in upper and lower respiratory sp ecimens during the acute  phase of infection. The expected result is Negative. Fact Sheet for Patients:  StrictlyIdeas.no Fact Sheet for Healthcare Providers: BankingDealers.co.za This test is not yet approved or cleared by the [place] FDA and has been authorized for detection and/or diagnosis of SARS-CoV-2 by FDA under an Emergency Use Authorization (EUA).  This EUA will remain in effect (meaning this test can be used) for the  duration of the COVID-19 declaration under Section 564(b)(1) of the Act, 21 U.S.C. section 360bbb-3(b)(1), unless the authorization is terminated or revoked sooner. Performed at [facility], [address], [place], [place] [address]   Urine culture     Status: None   Collection Time: 04/26/19  2:22 AM   Specimen: Urine, Random  Result Value Ref Range Status   Specimen Description   Final    URINE, RANDOM Performed at [facility], [address], [place], [place] [address]    Special Requests   Final    Normal Performed at [facility], [address] [place], [place] [address]    Culture   Final    Multiple bacterial morphotypes present, none predominant. Suggest appropriate recollection if clinically indicated.   Report Status 04/27/2019 FINAL  Final  MRSA PCR Screening     Status: None   Collection Time: 04/26/19  5:46 AM   Specimen: Nasal Mucosa; Nasopharyngeal  Result Value Ref Range Status   MRSA by PCR NEGATIVE NEGATIVE Final    Comment:        The GeneXpert MRSA Assay (FDA approved for NASAL specimens only), is one component of a comprehensive MRSA colonization surveillance program. It is not intended to diagnose MRSA infection nor to guide or monitor treatment for MRSA infections. Performed at [facility], [address] [place], [place] [address]     RADIOLOGY:  No results found.    Management plans discussed with the patient, family and they are in agreement.  CODE STATUS:     Code Status Orders  (From admission, onward)         Start     Ordered   04/26/19 0623  Full code  Continuous     04/26/19 0622       TOTAL TIME TAKING CARE OF THIS PATIENT: 40 minutes.    Henreitta Leber M.D on 04/30/2019 at 3:05 PM  Between 7am to 6pm - Pager - [phone]  After 6pm go to [email or web address] - [place]  [facility]  Office  [phone]  CC: Primary care physician;  Tracie Harrier, MD

## 2019-04-30 NOTE — Plan of Care (Signed)
  Problem: Education: Goal: Ability to demonstrate management of disease process will improve Outcome: Progressing   Problem: Activity: Goal: Capacity to carry out activities will improve Outcome: Progressing   

## 2019-04-30 NOTE — Discharge Instructions (Signed)
Acute Respiratory Failure, Adult ° °Acute respiratory failure occurs when there is not enough oxygen passing from your lungs to your body. When this happens, your lungs have trouble removing carbon dioxide from the blood. This causes your blood oxygen level to drop too low as carbon dioxide builds up. °Acute respiratory failure is a medical emergency. It can develop quickly, but it is temporary if treated promptly. Your lung capacity, or how much air your lungs can hold, may improve with time, exercise, and treatment. °What are the causes? °There are many possible causes of acute respiratory failure, including: °· Lung injury. °· Chest injury or damage to the ribs or tissues near the lungs. °· Lung conditions that affect the flow of air and blood into and out of the lungs, such as pneumonia, acute respiratory distress syndrome, and cystic fibrosis. °· Medical conditions, such as strokes or spinal cord injuries, that affect the muscles and nerves that control breathing. °· Blood infection (sepsis). °· Inflammation of the pancreas (pancreatitis). °· A blood clot in the lungs (pulmonary embolism). °· A large-volume blood transfusion. °· Burns. °· Near-drowning. °· Seizure. °· Smoke inhalation. °· Reaction to medicines. °· Alcohol or drug overdose. °What increases the risk? °This condition is more likely to develop in people who have: °· A blocked airway. °· Asthma. °· A condition or disease that damages or weakens the muscles, nerves, bones, or tissues that are involved in breathing. °· A serious infection. °· A health problem that blocks the unconscious reflex that is involved in breathing, such as hypothyroidism or sleep apnea. °· A lung injury or trauma. °What are the signs or symptoms? °Trouble breathing is the main symptom of acute respiratory failure. Symptoms may also include: °· Rapid breathing. °· Restlessness or anxiety. °· Skin, lips, or fingernails that appear blue (cyanosis). °· Rapid heart  rate. °· Abnormal heart rhythms (arrhythmias). °· Confusion or changes in behavior. °· Tiredness or loss of energy. °· Feeling sleepy or having a loss of consciousness. °How is this diagnosed? °Your health care provider can diagnose acute respiratory failure with a medical history and physical exam. During the exam, your health care provider will listen to your heart and check for crackling or wheezing sounds in your lungs. Your may also have tests to confirm the diagnosis and determine what is causing respiratory failure. These tests may include: °· Measuring the amount of oxygen in your blood (pulse oximetry). The measurement comes from a small device that is placed on your finger, earlobe, or toe. °· Other blood tests to measure blood gases and to look for signs of infection. °· Sampling your cerebral spinal fluid or tracheal fluid to check for infections. °· Chest X-ray to look for fluid in spaces that should be filled with air. °· Electrocardiogram (ECG) to look at the heart's electrical activity. °How is this treated? °Treatment for this condition usually takes places in a hospital intensive care unit (ICU). Treatment depends on what is causing the condition. It may include one or more treatments until your symptoms improve. Treatment may include: °· Supplemental oxygen. Extra oxygen is given through a tube in the nose, a face mask, or a hood. °· A device such as a continuous positive airway pressure (CPAP) or bi-level positive airway pressure (BiPAP or BPAP) machine. This treatment uses mild air pressure to keep the airways open. A mask or other device will be placed over your nose or mouth. A tube that is connected to a motor will deliver oxygen through the   mask. °· Ventilator. This treatment helps move air into and out of the lungs. This may be done with a bag and mask or a machine. For this treatment, a tube is placed in your windpipe (trachea) so air and oxygen can flow to the lungs. °· Extracorporeal  membrane oxygenation (ECMO). This treatment temporarily takes over the function of the heart and lungs, supplying oxygen and removing carbon dioxide. ECMO gives the lungs a chance to recover. It may be used if a ventilator is not effective. °· Tracheostomy. This is a procedure that creates a hole in the neck to insert a breathing tube. °· Receiving fluids and medicines. °· Rocking the bed to help breathing. °Follow these instructions at home: °· Take over-the-counter and prescription medicines only as told by your health care provider. °· Return to normal activities as told by your health care provider. Ask your health care provider what activities are safe for you. °· Keep all follow-up visits as told by your health care provider. This is important. °How is this prevented? °Treating infections and medical conditions that may lead to acute respiratory failure can help prevent the condition from developing. °Contact a health care provider if: °· You have a fever. °· Your symptoms do not improve or they get worse. °Get help right away if: °· You are having trouble breathing. °· You lose consciousness. °· Your have cyanosis or turn blue. °· You develop a rapid heart rate. °· You are confused. °These symptoms may represent a serious problem that is an emergency. Do not wait to see if the symptoms will go away. Get medical help right away. Call your local emergency services (911 in the [place] Do not drive yourself to the hospital. °This information is not intended to replace advice given to you by your health care provider. Make sure you discuss any questions you have with your health care provider. °Document Released: 09/12/2013 Document Revised: 08/20/2017 Document Reviewed: 03/25/2016 °Elsevier Patient Education © 2020 [employer] Inc. ° °

## 2019-04-30 NOTE — Plan of Care (Signed)
Pt ready for discharge home with family via [place].   Problem: Increased Nutrient Needs (NI-5.1) Goal: Food and/or nutrient delivery Description: Individualized approach for food/nutrient provision. Outcome: Completed/Met   Problem: Acute Rehab PT Goals(only PT should resolve) Goal: Pt Will Go Sit To Supine/Side Outcome: Completed/Met Goal: Pt Will Transfer Bed To Chair/Chair To Bed Outcome: Completed/Met Goal: Pt Will Ambulate Outcome: Completed/Met Goal: Pt Will Go Up/Down Stairs Outcome: Completed/Met   Problem: Acute Rehab OT Goals (only OT should resolve) Goal: OT Additional ADL Goal #1 Outcome: Completed/Met   Problem: Education: Goal: Ability to demonstrate management of disease process will improve Outcome: Completed/Met Goal: Ability to verbalize understanding of medication therapies will improve Outcome: Completed/Met Goal: Individualized Educational Video(s) Outcome: Completed/Met   Problem: Activity: Goal: Capacity to carry out activities will improve Outcome: Completed/Met   Problem: Cardiac: Goal: Ability to achieve and maintain adequate cardiopulmonary perfusion will improve Outcome: Completed/Met   Problem: Food- and Nutrition-Related Knowledge Deficit (NB-1.1) Goal: Nutrition education Description: Formal process to instruct or train a patient/client in a skill or to impart knowledge to help patients/clients voluntarily manage or modify food choices and eating behavior to maintain or improve health. Outcome: Completed/Met   Problem: Education: Goal: Knowledge of General Education information will improve Description: Including pain rating scale, medication(s)/side effects and non-pharmacologic comfort measures Outcome: Completed/Met   Problem: Health Behavior/Discharge Planning: Goal: Ability to manage health-related needs will improve Outcome: Completed/Met   Problem: Clinical Measurements: Goal: Ability to maintain clinical measurements within  normal limits will improve Outcome: Completed/Met Goal: Will remain free from infection Outcome: Completed/Met Goal: Diagnostic test results will improve Outcome: Completed/Met Goal: Respiratory complications will improve Outcome: Completed/Met Goal: Cardiovascular complication will be avoided Outcome: Completed/Met   Problem: Activity: Goal: Risk for activity intolerance will decrease Outcome: Completed/Met   Problem: Nutrition: Goal: Adequate nutrition will be maintained Outcome: Completed/Met   Problem: Coping: Goal: Level of anxiety will decrease Outcome: Completed/Met   Problem: Elimination: Goal: Will not experience complications related to bowel motility Outcome: Completed/Met Goal: Will not experience complications related to urinary retention Outcome: Completed/Met   Problem: Pain Managment: Goal: General experience of comfort will improve Outcome: Completed/Met   Problem: Safety: Goal: Ability to remain free from injury will improve Outcome: Completed/Met   Problem: Skin Integrity: Goal: Risk for impaired skin integrity will decrease Outcome: Completed/Met

## 2019-04-30 NOTE — TOC Transition Note (Signed)
Transition of Care [facility]) - CM/SW Discharge Note   Patient Details  Name: Catherine Landry MRN: [id number] Date of Birth: 08/05/1936  [facility] CM/SW Contact:  Latanya Maudlin, RN Phone Number: 04/30/2019, 9:14 AM   Clinical Narrative:  Patient to be discharged per MD order. Orders in place for home health services. Previous RNCM had setup home health via [place] care. Notified Corene Cornea of discharge. No DME needs. Spouse to transport.      Final next level of care: Home w Home Health Services Barriers to Discharge: No Barriers Identified   Patient Goals and CMS Choice   CMS Medicare.gov Compare Post Acute Care list provided to:: Patient Choice offered to / list presented to : Patient  Discharge Placement                       Discharge Plan and Services     Post Acute Care Choice: Home Health                    HH Arranged: RN, PT, OT, Nurse's Aide [facility] Agency: [place] ([place]) Date [facility] Contacted: 04/30/19 Time [place]: [phone] Representative spoke with at [place]: [place] ([place]) Interventions     Readmission Risk Interventions Readmission Risk Prevention Plan 04/30/2019  Transportation Screening Complete  PCP or Specialist Appt within 5-7 Days Complete  Home Care Screening Complete  Medication Review (RN CM) Complete  Some recent data might be hidden

## 2019-04-30 NOTE — Progress Notes (Addendum)
Pt called the nurse and complaints of indigestion. Notify prime. Will continue to monitor.  Update 0330: Dr. Marcille Blanco place a one time order for maalox. maalox was given to pt. Will continue to monitor.

## 2019-04-30 NOTE — Progress Notes (Signed)
Physical Therapy Treatment Patient Details Name: Catherine Landry MRN: [id number] DOB: 03-07-1936 Today's Date: 04/30/2019    History of Present Illness Pt is an 83 year old female admitted with acute on chronic hypoxic respiratory failure in the setting of COPD exacerbation and mild pulmonary edema requiring BiPAP.  Assessment includes: Acute on Chronic Hypoxic Respiratory Failure secondary to COPD Exacerbation & Mild pulmonary edema, Chest pain with elevated troponin (demand ischemia secondary to respiratory failure per cardiology), UTI, and anemia without s/sx of bleeding.    PT Comments    Pt standing at sink finishing bathing and commode with tech.  No balance deficits noted with tasks.  Pt generally fatigued and she was allowed an extended rest at EOB before gait.  Sats 88% on 2 lpm and recovered with rest and cues for breathing to 94%.  She was able to walk with walker and supervision 60' in hallway before fatigue.  Sats decreased to 80% on 2 lpm.  She was encouraged with proper breathing.  Pt stated she was "about to give out."  When asked why she walked so far, she was asked to sit by writer due to appearing tired, she shrugged her shoulders.  With time sats increased back to baseline.  Extensive education regarding pacing and listening to her body with mobility and tasks.  Pt does overall well with mobility but SOB will be limiting factor at home.  She voiced understanding.    Follow Up Recommendations  Home health PT     Equipment Recommendations    Rolling Walker   Recommendations for Other Services       Precautions / Restrictions Precautions Precautions: Fall Restrictions Weight Bearing Restrictions: No    Mobility  Bed Mobility Overal bed mobility: Modified Independent                Transfers Overall transfer level: Modified independent     Sit to Stand: Modified independent (Device/Increase time)            Ambulation/Gait Ambulation/Gait  assistance: Modified independent (Device/Increase time)   Assistive device: Rolling walker (2 wheeled) Gait Pattern/deviations: Step-through pattern Gait velocity: decreased   General Gait Details: generally steady   Chief Strategy Officer    Modified Rankin (Stroke Patients Only)       Balance Overall balance assessment: Modified Independent                                          Cognition Arousal/Alertness: Awake/alert Behavior During Therapy: WFL for tasks assessed/performed Overall Cognitive Status: Within Functional Limits for tasks assessed                                        Exercises      General Comments        Pertinent Vitals/Pain Pain Assessment: No/denies pain    Home Living                      Prior Function            PT Goals (current goals can now be found in the care plan section) Progress towards PT goals: Progressing toward goals    Frequency    Min 2X/week  PT Plan Current plan remains appropriate    Co-evaluation              AM-PAC PT "6 Clicks" Mobility   Outcome Measure  Help needed turning from your back to your side while in a flat bed without using bedrails?: None Help needed moving from lying on your back to sitting on the side of a flat bed without using bedrails?: None Help needed moving to and from a bed to a chair (including a wheelchair)?: None Help needed standing up from a chair using your arms (e.g., wheelchair or bedside chair)?: None Help needed to walk in hospital room?: A Little Help needed climbing 3-5 steps with a railing? : A Little 6 Click Score: 22    End of Session Equipment Utilized During Treatment: Gait belt;Oxygen Activity Tolerance: Patient tolerated treatment well;Other (comment) Patient left: in bed;with call bell/phone within reach;with bed alarm set         Time: [phone] PT Time Calculation (min)  (ACUTE ONLY): 24 min  Charges:  $Gait Training: 8-22 mins                    Chesley Noon, PTA 04/30/19, 9:37 AM

## 2019-04-30 NOTE — Progress Notes (Signed)
Pt escorted out via W/C to POV.  Supplemental oxygen tank from home available for pt.

## 2019-05-04 NOTE — Progress Notes (Signed)
Patient ID: Pieper Kasik, female    DOB: 1935-12-15, 83 y.o.   MRN: [id number]  HPI  Ms Mcgillivray is a 83 y/o female with a history of asthma, HTN, CKD, COPD, GERD, arthritis, gout, previous tobacco use and chronic heart failure.   Echo report from 04/26/2019 reviewed and showed an EF of 50-55% along with trivial AR.   Admitted 04/26/2019 due to acute on chronic respiratory failure with hypoxia-secondary to COPD exacerbation. Cardiology consult obtained. Given IV steroids and nebulizers. Discharged after 4 days.   She presents today for her initial visit with a chief complaint of moderate shortness of breath upon minimal exertion. She describes this as chronic in nature having been present for several years. She has associated fatigue, decreased appetite, pedal edema, easy bruising and difficulty sleeping along with this. She denies any abdominal distention, palpitations, chest pain, cough, dizziness or weight gain.   Past Medical History:  Diagnosis Date  . Arthritis   . Asthma   . Bladder cancer ([place]) 2015  . Cancer [facility]) 2001   kidney left   . CHF (congestive heart failure) ([place])   . COPD (chronic obstructive pulmonary disease) ([place])   . GERD (gastroesophageal reflux disease)   . Gout   . History of bladder cancer   . History of kidney cancer   . Hypertension   . Raynaud's disease   . Renal insufficiency    Past Surgical History:  Procedure Laterality Date  . APPENDECTOMY    . BACK SURGERY    . CATARACT EXTRACTION    . COLONOSCOPY WITH PROPOFOL N/A 10/15/2016   Procedure: COLONOSCOPY WITH PROPOFOL;  Surgeon: Lollie Sails, MD;  Location: [facility] ENDOSCOPY;  Service: Endoscopy;  Laterality: N/A;  . ESOPHAGOGASTRODUODENOSCOPY (EGD) WITH PROPOFOL N/A 10/15/2016   Procedure: ESOPHAGOGASTRODUODENOSCOPY (EGD) WITH PROPOFOL;  Surgeon: Lollie Sails, MD;  Location: [facility] ENDOSCOPY;  Service: Endoscopy;  Laterality: N/A;  . EYE SURGERY Left 12/2018  . NEPHRECTOMY     Family  History  Problem Relation Age of Onset  . Breast cancer Mother 69  . Stroke Mother   . Breast cancer Other 67  . Heart disease Father    Social History   Tobacco Use  . Smoking status: Former Smoker    Packs/day: 1.50    Years: 40.00    Pack years: 60.00    Quit date: 12/09/1999    Years since quitting: 19.4  . Smokeless tobacco: Never Used  Substance Use Topics  . Alcohol use: Yes    Comment: Socally    No Known Allergies Prior to Admission medications   Medication Sig Start Date End Date Taking? Authorizing Provider  acetaminophen (TYLENOL) 650 MG CR tablet Take 650 mg by mouth every 8 (eight) hours as needed for pain.   Yes [provider]  aspirin EC 81 MG tablet Take 81 mg by mouth every morning.   Yes [provider]  Calcium Carbonate-Vit D-Min (CALCIUM 600+D PLUS MINERALS) 600-400 MG-UNIT TABS Take 2 tablets by mouth daily.   Yes [provider]  COMBIVENT RESPIMAT 20-100 MCG/ACT AERS respimat Inhale 1 puff into the lungs 4 (four) times daily as needed for wheezing or shortness of breath.    Yes [provider]  fluticasone (FLONASE) 50 MCG/ACT nasal spray Place 2 sprays into both nostrils daily.   Yes [provider]  Fluticasone-Umeclidin-Vilant 100-62.5-25 MCG/INH AEPB Inhale 1 puff into the lungs daily. 05/04/19  Yes [provider]  hydrOXYzine (ATARAX/VISTARIL) 10 MG  tablet Take 10 mg by mouth 3 (three) times daily as needed for itching.   Yes [provider]  iron polysaccharides (NIFEREX) 150 MG capsule Take 150 mg by mouth daily.   Yes [provider]  levalbuterol (XOPENEX) 0.63 MG/3ML nebulizer solution Take 3 mLs (0.63 mg total) by nebulization every 6 (six) hours as needed for wheezing or shortness of breath. DX: COPD DX Code: J44.9 01/12/19 01/12/20 Yes Laverle Hobby, MD  losartan (COZAAR) 50 MG tablet Take 50 mg by mouth every morning.   Yes [provider]  metoprolol  succinate (TOPROL-XL) 25 MG 24 hr tablet Take 25 mg by mouth every morning.   Yes [provider]  Multiple Vitamin (MULTIVITAMIN WITH MINERALS) TABS Take 1 tablet by mouth every morning.   Yes [provider]  pantoprazole (PROTONIX) 40 MG tablet Take 40 mg by mouth 2 (two) times daily.   Yes [provider]  polyethylene glycol powder (GLYCOLAX/MIRALAX) powder Take 17 g by mouth daily as needed for constipation. Mix with 4 to 8 ounces of fluid. 04/10/16  Yes [provider]  theophylline (THEO-24) 300 MG 24 hr capsule Take 1 capsule (300 mg total) by mouth every morning. 10/12/15  Yes Mody, Sital, MD  vitamin B-12 (CYANOCOBALAMIN) 1000 MCG tablet Take 3,000 mcg by mouth daily.   Yes [provider]  vitamin C (ASCORBIC ACID) 500 MG tablet Take 500 mg by mouth daily.   Yes [provider]    Review of Systems  Constitutional: Positive for appetite change (decreased) and fatigue (easily).  HENT: Positive for rhinorrhea. Negative for congestion and sore throat.   Eyes: Negative.   Respiratory: Positive for shortness of breath (with minimal exertion). Negative for cough and chest tightness.   Cardiovascular: Positive for leg swelling. Negative for chest pain and palpitations.  Gastrointestinal: Negative for abdominal distention and abdominal pain.  Endocrine: Negative.   Genitourinary: Negative.   Musculoskeletal: Negative for back pain and neck pain.  Skin: Negative.   Allergic/Immunologic: Negative.   Neurological: Negative for dizziness and light-headedness.  Hematological: Negative for adenopathy. Bruises/bleeds easily.  Psychiatric/Behavioral: Positive for sleep disturbance (not sleeping well with prednisone (took last dose today)). Negative for dysphoric mood. The patient is not nervous/anxious.     Vitals:   05/05/19 1319  BP: (!) 106/59  Pulse: 77  Resp: 18  SpO2: 90%  Weight: 137 lb 8 oz (62.4 kg)  Height: 5\' 2"  (1.575 m)    Wt Readings from Last 3 Encounters:  05/05/19 137 lb 8 oz (62.4 kg)  04/30/19 134 lb 9.6 oz (61.1 kg)  05/17/18 144 lb (65.3 kg)   Lab Results  Component Value Date   CREATININE 1.60 (H) 04/27/2019   CREATININE 1.17 (H) 04/26/2019   CREATININE 0.96 11/27/2017    Physical Exam Vitals signs and nursing note reviewed.  Constitutional:      Appearance: Normal appearance.  HENT:     Head: Normocephalic and atraumatic.  Neck:     Musculoskeletal: Normal range of motion and neck supple.  Cardiovascular:     Rate and Rhythm: Normal rate and regular rhythm.  Pulmonary:     Effort: Pulmonary effort is normal. No respiratory distress.     Breath sounds: No wheezing or rales.  Abdominal:     General: There is no distension.     Palpations: Abdomen is soft.  Musculoskeletal:        General: No tenderness.     Right lower leg: No  edema.     Left lower leg: No edema.  Skin:    General: Skin is warm and dry.  Neurological:     General: No focal deficit present.     Mental Status: She is alert and oriented to person, place, and time.  Psychiatric:        Mood and Affect: Mood normal.        Behavior: Behavior normal.     Assessment & Plan:  1: Chronic heart failure with preserved ejection fraction- - NYHA class III - euvolemic today - weighing daily; reminded to call for an overnight weight gain of >2 pounds or a weekly weight gain of >5 pounds - not adding salt and has recently starting reading food labels; did eat some pretzels yesterday and noticed some pedal edema this morning - edema resolves when elevating legs - saw cardiology Minette Brine) 04/17/2019 - currently has home health - BNP 04/27/2019 was 1095.0  2: HTN- - BP on the low side - saw PCP (Hande) 05/04/2019 - BMP 04/27/2019 reviewed and showed sodium 137, potassium 4.9, creatinine 1.6 and GFR 30  3: COPD- - wearing oxygen at 2L around the clock - using trelegy and nebulizer  Patient did not bring her medications  nor a list. Each medication was verbally reviewed with the patient and she was encouraged to bring the bottles to every visit to confirm accuracy of list.  Return in 2 months or sooner for any questions/problems before then.

## 2019-05-05 ENCOUNTER — Ambulatory Visit: Payer: Medicare Other | Attending: Family | Admitting: Family

## 2019-05-05 ENCOUNTER — Other Ambulatory Visit: Payer: Self-pay

## 2019-05-05 ENCOUNTER — Encounter: Payer: Self-pay | Admitting: Family

## 2019-05-05 VITALS — BP 106/59 | HR 77 | Resp 18 | Ht 62.0 in | Wt 137.5 lb

## 2019-05-05 DIAGNOSIS — J45909 Unspecified asthma, uncomplicated: Secondary | ICD-10-CM | POA: Insufficient documentation

## 2019-05-05 DIAGNOSIS — Z8249 Family history of ischemic heart disease and other diseases of the circulatory system: Secondary | ICD-10-CM | POA: Insufficient documentation

## 2019-05-05 DIAGNOSIS — Z803 Family history of malignant neoplasm of breast: Secondary | ICD-10-CM | POA: Insufficient documentation

## 2019-05-05 DIAGNOSIS — Z79899 Other long term (current) drug therapy: Secondary | ICD-10-CM | POA: Diagnosis not present

## 2019-05-05 DIAGNOSIS — K219 Gastro-esophageal reflux disease without esophagitis: Secondary | ICD-10-CM | POA: Insufficient documentation

## 2019-05-05 DIAGNOSIS — I7300 Raynaud's syndrome without gangrene: Secondary | ICD-10-CM | POA: Diagnosis not present

## 2019-05-05 DIAGNOSIS — I130 Hypertensive heart and chronic kidney disease with heart failure and stage 1 through stage 4 chronic kidney disease, or unspecified chronic kidney disease: Secondary | ICD-10-CM | POA: Diagnosis present

## 2019-05-05 DIAGNOSIS — N189 Chronic kidney disease, unspecified: Secondary | ICD-10-CM | POA: Insufficient documentation

## 2019-05-05 DIAGNOSIS — I10 Essential (primary) hypertension: Secondary | ICD-10-CM

## 2019-05-05 DIAGNOSIS — I5032 Chronic diastolic (congestive) heart failure: Secondary | ICD-10-CM | POA: Diagnosis present

## 2019-05-05 DIAGNOSIS — M1990 Unspecified osteoarthritis, unspecified site: Secondary | ICD-10-CM | POA: Insufficient documentation

## 2019-05-05 DIAGNOSIS — Z8551 Personal history of malignant neoplasm of bladder: Secondary | ICD-10-CM | POA: Diagnosis not present

## 2019-05-05 DIAGNOSIS — Z905 Acquired absence of kidney: Secondary | ICD-10-CM | POA: Diagnosis not present

## 2019-05-05 DIAGNOSIS — Z87891 Personal history of nicotine dependence: Secondary | ICD-10-CM | POA: Diagnosis not present

## 2019-05-05 DIAGNOSIS — Z7982 Long term (current) use of aspirin: Secondary | ICD-10-CM | POA: Diagnosis not present

## 2019-05-05 DIAGNOSIS — Z85528 Personal history of other malignant neoplasm of kidney: Secondary | ICD-10-CM | POA: Diagnosis not present

## 2019-05-05 DIAGNOSIS — J449 Chronic obstructive pulmonary disease, unspecified: Secondary | ICD-10-CM | POA: Diagnosis not present

## 2019-05-05 NOTE — Patient Instructions (Addendum)
Continue weighing daily and call for an overnight weight gain of > 2 pounds or a weekly weight gain of >5 pounds.    Low-Sodium Eating Plan Sodium, which is an element that makes up salt, helps you maintain a healthy balance of fluids in your body. Too much sodium can increase your blood pressure and cause fluid and waste to be held in your body. Your health care provider or dietitian may recommend following this plan if you have high blood pressure (hypertension), kidney disease, liver disease, or heart failure. Eating less sodium can help lower your blood pressure, reduce swelling, and protect your heart, liver, and kidneys. What are tips for following this plan? General guidelines  Most people on this plan should limit their sodium intake to 2,000 mg (milligrams) of sodium each day. Reading food labels   The Nutrition Facts label lists the amount of sodium in one serving of the food. If you eat more than one serving, you must multiply the listed amount of sodium by the number of servings.  Choose foods with less than 140 mg of sodium per serving.  Avoid foods with 300 mg of sodium or more per serving. Shopping  Look for lower-sodium products, often labeled as "low-sodium" or "no salt added."  Always check the sodium content even if foods are labeled as "unsalted" or "no salt added".  Buy fresh foods. ? Avoid canned foods and premade or frozen meals. ? Avoid canned, cured, or processed meats  Buy breads that have less than 80 mg of sodium per slice. Cooking  Eat more home-cooked food and less restaurant, buffet, and fast food.  Avoid adding salt when cooking. Use salt-free seasonings or herbs instead of table salt or sea salt. Check with your health care provider or pharmacist before using salt substitutes.  Cook with plant-based oils, such as canola, sunflower, or olive oil. Meal planning  When eating at a restaurant, ask that your food be prepared with less salt or no salt,  if possible.  Avoid foods that contain MSG (monosodium glutamate). MSG is sometimes added to [place] food, bouillon, and some canned foods. What foods are recommended? The items listed may not be a complete list. Talk with your dietitian about what dietary choices are best for you. Grains Low-sodium cereals, including oats, puffed wheat and rice, and shredded wheat. Low-sodium crackers. Unsalted rice. Unsalted pasta. Low-sodium bread. Whole-grain breads and whole-grain pasta. Vegetables Fresh or frozen vegetables. "No salt added" canned vegetables. "No salt added" tomato sauce and paste. Low-sodium or reduced-sodium tomato and vegetable juice. Fruits Fresh, frozen, or canned fruit. Fruit juice. Meats and other protein foods Fresh or frozen (no salt added) meat, poultry, seafood, and fish. Low-sodium canned tuna and salmon. Unsalted nuts. Dried peas, beans, and lentils without added salt. Unsalted canned beans. Eggs. Unsalted nut butters. Dairy Milk. Soy milk. Cheese that is naturally low in sodium, such as ricotta cheese, fresh mozzarella, or Swiss cheese Low-sodium or reduced-sodium cheese. Cream cheese. Yogurt. Fats and oils Unsalted butter. Unsalted margarine with no trans fat. Vegetable oils such as canola or olive oils. Seasonings and other foods Fresh and dried herbs and spices. Salt-free seasonings. Low-sodium mustard and ketchup. Sodium-free salad dressing. Sodium-free light mayonnaise. Fresh or refrigerated horseradish. Lemon juice. Vinegar. Homemade, reduced-sodium, or low-sodium soups. Unsalted popcorn and pretzels. Low-salt or salt-free chips. What foods are not recommended? The items listed may not be a complete list. Talk with your dietitian about what dietary choices are best for you. Grains Instant  hot cereals. Bread stuffing, pancake, and biscuit mixes. Croutons. Seasoned rice or pasta mixes. Noodle soup cups. Boxed or frozen macaroni and cheese. Regular salted crackers.  Self-rising flour. Vegetables Sauerkraut, pickled vegetables, and relishes. Olives. [place] fries. Onion rings. Regular canned vegetables (not low-sodium or reduced-sodium). Regular canned tomato sauce and paste (not low-sodium or reduced-sodium). Regular tomato and vegetable juice (not low-sodium or reduced-sodium). Frozen vegetables in sauces. Meats and other protein foods Meat or fish that is salted, canned, smoked, spiced, or pickled. Bacon, ham, sausage, hotdogs, corned beef, chipped beef, packaged lunch meats, salt pork, jerky, pickled herring, anchovies, regular canned tuna, sardines, salted nuts. Dairy Processed cheese and cheese spreads. Cheese curds. Blue cheese. Feta cheese. String cheese. Regular cottage cheese. Buttermilk. Canned milk. Fats and oils Salted butter. Regular margarine. Ghee. Bacon fat. Seasonings and other foods Onion salt, garlic salt, seasoned salt, table salt, and sea salt. Canned and packaged gravies. Worcestershire sauce. Tartar sauce. Barbecue sauce. Teriyaki sauce. Soy sauce, including reduced-sodium. Steak sauce. Fish sauce. Oyster sauce. Cocktail sauce. Horseradish that you find on the shelf. Regular ketchup and mustard. Meat flavorings and tenderizers. Bouillon cubes. Hot sauce and Tabasco sauce. Premade or packaged marinades. Premade or packaged taco seasonings. Relishes. Regular salad dressings. Salsa. Potato and tortilla chips. Corn chips and puffs. Salted popcorn and pretzels. Canned or dried soups. Pizza. Frozen entrees and pot pies. Summary  Eating less sodium can help lower your blood pressure, reduce swelling, and protect your heart, liver, and kidneys.  Most people on this plan should limit their sodium intake to 1,500-2,000 mg (milligrams) of sodium each day.  Canned, boxed, and frozen foods are high in sodium. Restaurant foods, fast foods, and pizza are also very high in sodium. You also get sodium by adding salt to food.  Try to cook at home, eat  more fresh fruits and vegetables, and eat less fast food, canned, processed, or prepared foods. This information is not intended to replace advice given to you by your health care provider. Make sure you discuss any questions you have with your health care provider. Document Released: 02/27/2002 Document Revised: 08/20/2017 Document Reviewed: 08/31/2016 Elsevier Patient Education  2020 [employer].

## 2019-05-09 ENCOUNTER — Ambulatory Visit: Payer: Medicare Other | Admitting: Urology

## 2019-05-10 ENCOUNTER — Other Ambulatory Visit: Payer: Self-pay | Admitting: Internal Medicine

## 2019-05-20 ENCOUNTER — Other Ambulatory Visit: Payer: Self-pay | Admitting: Internal Medicine

## 2019-06-02 ENCOUNTER — Ambulatory Visit: Payer: Medicare Other | Admitting: Urology

## 2019-06-13 ENCOUNTER — Other Ambulatory Visit
Admission: RE | Admit: 2019-06-13 | Discharge: 2019-06-13 | Disposition: A | Discharge status: Home / Self Care | Payer: Medicare Other | Source: Ambulatory Visit | Attending: Cardiology | Admitting: Cardiology

## 2019-06-13 DIAGNOSIS — R0602 Shortness of breath: Secondary | ICD-10-CM | POA: Insufficient documentation

## 2019-06-13 DIAGNOSIS — R079 Chest pain, unspecified: Secondary | ICD-10-CM | POA: Diagnosis present

## 2019-06-13 DIAGNOSIS — I10 Essential (primary) hypertension: Secondary | ICD-10-CM | POA: Insufficient documentation

## 2019-06-13 LAB — BRAIN NATRIURETIC PEPTIDE: B Natriuretic Peptide: 694 pg/mL — ABNORMAL HIGH (ref 0.0–100.0)

## 2019-06-16 ENCOUNTER — Other Ambulatory Visit
Admission: RE | Admit: 2019-06-16 | Discharge: 2019-06-16 | Disposition: A | Discharge status: Home / Self Care | Payer: Medicare Other | Source: Ambulatory Visit | Attending: Cardiology | Admitting: Cardiology

## 2019-06-16 DIAGNOSIS — Z20828 Contact with and (suspected) exposure to other viral communicable diseases: Secondary | ICD-10-CM | POA: Diagnosis not present

## 2019-06-16 DIAGNOSIS — Z01812 Encounter for preprocedural laboratory examination: Secondary | ICD-10-CM | POA: Insufficient documentation

## 2019-06-17 LAB — SARS CORONAVIRUS 2 (TAT 6-24 HRS): SARS Coronavirus 2: NEGATIVE

## 2019-06-20 ENCOUNTER — Other Ambulatory Visit: Payer: Self-pay | Admitting: Internal Medicine

## 2019-06-21 ENCOUNTER — Ambulatory Visit
Admission: RE | Admit: 2019-06-21 | Discharge: 2019-06-21 | Disposition: A | Discharge status: Home / Self Care | Payer: Medicare Other | Attending: Cardiology | Admitting: Cardiology

## 2019-06-21 ENCOUNTER — Other Ambulatory Visit: Payer: Self-pay | Admitting: Cardiology

## 2019-06-21 ENCOUNTER — Encounter: Payer: Self-pay | Admitting: *Deleted

## 2019-06-21 ENCOUNTER — Other Ambulatory Visit: Payer: Self-pay

## 2019-06-21 ENCOUNTER — Encounter
Admission: RE | Disposition: A | Discharge status: Home / Self Care | Payer: Self-pay | Source: Home / Self Care | Attending: Cardiology

## 2019-06-21 DIAGNOSIS — R079 Chest pain, unspecified: Secondary | ICD-10-CM

## 2019-06-21 DIAGNOSIS — N189 Chronic kidney disease, unspecified: Secondary | ICD-10-CM | POA: Insufficient documentation

## 2019-06-21 DIAGNOSIS — Z905 Acquired absence of kidney: Secondary | ICD-10-CM | POA: Insufficient documentation

## 2019-06-21 DIAGNOSIS — I272 Pulmonary hypertension, unspecified: Secondary | ICD-10-CM | POA: Insufficient documentation

## 2019-06-21 DIAGNOSIS — K219 Gastro-esophageal reflux disease without esophagitis: Secondary | ICD-10-CM | POA: Diagnosis not present

## 2019-06-21 DIAGNOSIS — J449 Chronic obstructive pulmonary disease, unspecified: Secondary | ICD-10-CM | POA: Diagnosis not present

## 2019-06-21 DIAGNOSIS — I5032 Chronic diastolic (congestive) heart failure: Secondary | ICD-10-CM | POA: Diagnosis not present

## 2019-06-21 DIAGNOSIS — Z79899 Other long term (current) drug therapy: Secondary | ICD-10-CM | POA: Insufficient documentation

## 2019-06-21 DIAGNOSIS — Z85528 Personal history of other malignant neoplasm of kidney: Secondary | ICD-10-CM | POA: Insufficient documentation

## 2019-06-21 DIAGNOSIS — Z87891 Personal history of nicotine dependence: Secondary | ICD-10-CM | POA: Diagnosis not present

## 2019-06-21 DIAGNOSIS — I130 Hypertensive heart and chronic kidney disease with heart failure and stage 1 through stage 4 chronic kidney disease, or unspecified chronic kidney disease: Secondary | ICD-10-CM | POA: Diagnosis not present

## 2019-06-21 HISTORY — PX: RIGHT/LEFT HEART CATH AND CORONARY ANGIOGRAPHY: CATH118266

## 2019-06-21 SURGERY — RIGHT/LEFT HEART CATH AND CORONARY ANGIOGRAPHY
Anesthesia: Moderate Sedation

## 2019-06-21 MED ORDER — SODIUM CHLORIDE 0.9 % WEIGHT BASED INFUSION
1.0000 mL/kg/h | INTRAVENOUS | Status: DC
  Administered 2019-06-21: 1 mL/kg/h via INTRAVENOUS

## 2019-06-21 MED ORDER — ACETAMINOPHEN 500 MG PO TABS
1000.0000 mg | ORAL_TABLET | Freq: Once | ORAL | Status: AC
  Administered 2019-06-21: 10:00:00 1000 mg via ORAL

## 2019-06-21 MED ORDER — IOHEXOL 300 MG/ML  SOLN
INTRAMUSCULAR | Status: DC | PRN
  Administered 2019-06-21: 12:00:00 105 mL

## 2019-06-21 MED ORDER — ACETAMINOPHEN 500 MG PO TABS
ORAL_TABLET | ORAL | Status: AC
  Administered 2019-06-21: 1000 mg via ORAL
  Filled 2019-06-21: qty 2

## 2019-06-21 MED ORDER — SODIUM CHLORIDE 0.9% FLUSH: 3.0000 mL | Freq: Two times a day (BID) | INTRAVENOUS | Status: DC

## 2019-06-21 MED ORDER — SODIUM CHLORIDE 0.9% FLUSH: 3.0000 mL | INTRAVENOUS | Status: DC | PRN

## 2019-06-21 MED ORDER — FENTANYL CITRATE (PF) 100 MCG/2ML IJ SOLN
INTRAMUSCULAR | Status: AC
  Filled 2019-06-21: qty 2

## 2019-06-21 MED ORDER — MIDAZOLAM HCL 2 MG/2ML IJ SOLN
INTRAMUSCULAR | Status: AC
  Filled 2019-06-21: qty 2

## 2019-06-21 MED ORDER — SODIUM CHLORIDE 0.9 % IV SOLN: 250.0000 mL | INTRAVENOUS | Status: DC | PRN

## 2019-06-21 MED ORDER — MIDAZOLAM HCL 2 MG/2ML IJ SOLN
INTRAMUSCULAR | Status: DC | PRN
  Administered 2019-06-21: 1 mg via INTRAVENOUS

## 2019-06-21 MED ORDER — HEPARIN (PORCINE) IN NACL 1000-0.9 UT/500ML-% IV SOLN
INTRAVENOUS | Status: DC | PRN
  Administered 2019-06-21: 500 mL

## 2019-06-21 MED ORDER — ACETAMINOPHEN 325 MG PO TABS: 650.0000 mg | ORAL_TABLET | ORAL | Status: DC | PRN

## 2019-06-21 MED ORDER — ASPIRIN 81 MG PO CHEW: 81.0000 mg | CHEWABLE_TABLET | ORAL | Status: DC

## 2019-06-21 MED ORDER — FENTANYL CITRATE (PF) 100 MCG/2ML IJ SOLN
INTRAMUSCULAR | Status: DC | PRN
  Administered 2019-06-21: 25 ug via INTRAVENOUS

## 2019-06-21 MED ORDER — CLOPIDOGREL BISULFATE 75 MG PO TABS: 75.0000 mg | ORAL_TABLET | Freq: Every day | ORAL | 11 refills | Status: AC

## 2019-06-21 MED ORDER — HEPARIN (PORCINE) IN NACL 1000-0.9 UT/500ML-% IV SOLN
INTRAVENOUS | Status: AC
  Filled 2019-06-21: qty 1000

## 2019-06-21 MED ORDER — SODIUM CHLORIDE 0.9 % IV SOLN: INTRAVENOUS | Status: DC

## 2019-06-21 MED ORDER — ONDANSETRON HCL 4 MG/2ML IJ SOLN: 4.0000 mg | Freq: Four times a day (QID) | INTRAMUSCULAR | Status: DC | PRN

## 2019-06-21 MED ORDER — LABETALOL HCL 5 MG/ML IV SOLN: 10.0000 mg | INTRAVENOUS | Status: DC | PRN

## 2019-06-21 MED ORDER — HYDRALAZINE HCL 20 MG/ML IJ SOLN: 10.0000 mg | INTRAMUSCULAR | Status: DC | PRN

## 2019-06-21 MED ORDER — SODIUM CHLORIDE 0.9 % WEIGHT BASED INFUSION: 3.0000 mL/kg/h | INTRAVENOUS | Status: DC

## 2019-06-21 SURGICAL SUPPLY — 16 items
CATH INFINITI 5FR ANG PIGTAIL (CATHETERS) ×2 IMPLANT
CATH INFINITI 5FR JL4 (CATHETERS) ×2 IMPLANT
CATH INFINITI 5FR JL5 (CATHETERS) ×2 IMPLANT
CATH INFINITI JR4 5F (CATHETERS) ×2 IMPLANT
CATH SWANZ 7F THERMO (CATHETERS) ×2 IMPLANT
DEVICE CLOSURE MYNXGRIP 5F (Vascular Products) ×2 IMPLANT
GUIDEWIRE EMER 3M J .025X150CM (WIRE) ×2 IMPLANT
KIT MANI 3VAL PERCEP (MISCELLANEOUS) ×3 IMPLANT
KIT RIGHT HEART (MISCELLANEOUS) ×3 IMPLANT
NDL PERC 18GX7CM (NEEDLE) IMPLANT
NEEDLE PERC 18GX7CM (NEEDLE) ×3 IMPLANT
PACK CARDIAC CATH (CUSTOM PROCEDURE TRAY) ×3 IMPLANT
SHEATH AVANTI 5FR X 11CM (SHEATH) ×2 IMPLANT
SHEATH AVANTI 6FR X 11CM (SHEATH) IMPLANT
SHEATH AVANTI 7FRX11 (SHEATH) ×2 IMPLANT
WIRE GUIDERIGHT .035X150 (WIRE) ×2 IMPLANT

## 2019-06-21 NOTE — Progress Notes (Signed)
Chief Complaint: Chief Complaint  Patient presents with  . Shortness of Breath  . Chest Pain  Date of Service: 06/13/2019 Date of Birth: Oct 29, 1935 PCP: Azzie Glatter, MD  History of Present Illness: Catherine Landry is a 83 y.o.female patient who presents for a follow-up visit. She was recently hospitalized with shortness of breath with an elevated BNP. Echo during her hospitalization again showed preserved LV function with limited views. She has some shortness of breath and underwent an echocardiogram in June 2019.. This revealed normal LV function with moderate TR mild MR. She had moderate to severe pulmonary hypertension with an estimated right ventricular systolic pressure of [id number]. She is been diagnosed with COPD and is being closely followed by pulmonology. She also is being treated for chronic kidney disease has a single kidney and diastolic heart failure, hypertension. She is also had a history of renal cell carcinoma. She denies back or arm pain. She denies chest pain. Functional study 3 or 4 years ago was unremarkable for ischemia.. She has a lot of shortness of breath with activity. She is on 24/7 oxygen per nasal cannula. EKG today reveals sinus rhythm at a rate of 81 bpm with a PR interval 136 ms, QRS duration 128 ms with a QTC of 473 ms with a QRS axis VI 7 degrees. She has a nonspecific interventricular conduction delay unchanged from baseline. She continues to have chest tightness and heaviness. We will need to follow her renal function and if adequate will consider right left heart cath to evaluate coronary anatomy and pulmonary pressures.  Past Medical and Surgical History  Past Medical History Past Medical History:  Diagnosis Date  . Allergy  . Arthritis  . Asthma, unspecified asthma severity, unspecified whether complicated, unspecified whether persistent  . Bladder cancer (CMS-HCC) 01/03/2014  . Chronic cystitis  . Chronic kidney disease (CKD)  . COPD (chronic  obstructive pulmonary disease) (CMS-HCC)  . Diverticulosis 10/15/2016  . Erosive gastritis 10/15/2016  . GERD (gastroesophageal reflux disease) 01/03/2014  . History of blood transfusion 2005?  hip replacement  . History of cataract  . Hypertension  . Internal hemorrhoids 10/15/2016  . Lung nodules  . Reflux esophagitis 10/15/2016  . Renal cell cancer (CMS-HCC)  . Thyroid nodule  unclear if benign  . Urothelial carcinoma (CMS-HCC)  squamous histology   Past Surgical History She has a past surgical history that includes Nephroureterectomy (Left, 2002); Hysterectomy; Appendectomy; Arthroplasty Hip Total (Right); discectomy; Tonsillectomy; Cataract extraction; cystourethroscopy w/fulguration &/or resection bladder tumor (N/A, 01/16/2014); Replacement total hip w/ resurfacing implants (Right, 10/02/2003); cystourethroscopy w/biopsy (N/A, 05/30/2014); Colonoscopy (10/15/2016); egd (10/15/2016); repair complex retinal detachment (Left, 10/2018); Appendectomy; Hernia repair; and Joint replacement.   Medications and Allergies  Current Medications  Current Outpatient Medications  Medication Sig Dispense Refill  . acetaminophen (TYLENOL) 650 MG ER tablet Take 650 mg by mouth every 8 (eight) hours as needed for Pain.  Marland Kitchen ALPRAZolam (XANAX) 0.25 MG tablet Take 1 tablet (0.25 mg total) by mouth once daily as needed for Sleep for up to 20 days 20 tablet 0  . ascorbic acid (VITAMIN C ORAL) Take 1 capsule by mouth once daily  . ascorbic acid, vitamin C, (VITAMIN C) 500 MG tablet Take 1 tablet by mouth once daily  . aspirin 81 MG EC tablet Take 81 mg by mouth once daily.  . busPIRone (BUSPAR) 7.5 MG tablet Take 1 tablet (7.5 mg total) by mouth 2 (two) times daily 60 tablet 3  . calcium carbonate-vitamin D3 (CALTRATE  600+D) 600 mg(1,500mg ) -400 unit tablet Take 2 tablets by mouth once daily.  . colchicine (COLCRYS) 0.6 mg tablet Take 2 tablets (1.2mg ) by mouth at first sign of gout flare followed by 1  tablet (0.6mg ) after 1 hour. (Max 1.8mg  within 1 hour) 10 tablet 3  . cyanocobalamin (VITAMIN B12) 1000 MCG tablet Take 3,000 mcg by mouth once daily.  Marland Kitchen diaper,brief,adult,disposable Misc Use 10 Pad continuously as needed. 300 each 6  . fluticasone propionate (FLONASE) 50 mcg/actuation nasal spray SPRAY 2 SPRAYS INTO EACH NOSTRIL EVERY DAY 48 g 1  . FOLIC ACID/MULTIVIT-MIN/LUTEIN (CENTRUM SILVER ORAL) Take 1 tablet by mouth once daily.  . hydrocortisone 2.5 % cream Apply topically 2 (two) times daily Use on affected area x 10 days 30 g 2  . ipratropium-albuteroL (COMBIVENT RESPIMAT) 20-100 mcg/actuation inhaler Inhale 1 inhalation into the lungs 4 (four) times daily 3 Inhaler 3  . iron polysaccharides (IRON POLYSACCHARIDES) 150 mg iron capsule Take 1 capsule (150 mg total) by mouth 2 (two) times daily 180 capsule 3  . levalbuterol (XOPENEX) 0.63 mg/3 mL nebulizer solution TAKE 3 MLS BY NEBULIZATION EVERY 6HRS AS NEEDED FOR WHEEZING/SHORTNESS OF BREATH [id number] DX:J44.9 5  . losartan (COZAAR) 50 MG tablet TAKE 1 TABLET BY MOUTH EVERY DAY 90 tablet 1  . metoprolol succinate (TOPROL-XL) 25 MG XL tablet TAKE 1 TABLET BY MOUTH EVERY DAY 90 tablet 1  . pantoprazole (PROTONIX) 40 MG DR tablet Take 1 tablet by mouth 2 (two) times daily  . polyethylene glycol (MIRALAX) powder Take 17 g by mouth once daily. Mix in 4-8ounces of fluid prior to taking. 765 g 3  . theophylline (THEODUR) 300 MG ER tablet Take 1 tablet (300 mg total) by mouth once daily 90 tablet 1  . fluticasone-umeclidin-vilanter (TRELEGY ELLIPTA) 100-62.5-25 mcg DsDv Inhale 1 inhalation into the lungs once daily (Patient not taking: Reported on 06/06/2019 ) 1 Disk 5   No current facility-administered medications for this visit.   Allergies: Sulfa (sulfonamide antibiotics)  Social and Family History  Social History reports that she quit smoking about 18 years ago. She has a 48.00 pack-year smoking history. She has never used smokeless  tobacco. She reports current alcohol use. She reports that she does not use drugs.  Family History Family History  Problem Relation Age of Onset  . Stomach cancer Father  . Coronary Artery Disease (Blocked arteries around heart) Father  . Asthma Father  . Breast cancer Mother  . Stroke Mother  . Diabetes type II Brother  . Coronary Artery Disease (Blocked arteries around heart) Brother  . Asthma Brother  . Stroke Brother  . Coronary Artery Disease (Blocked arteries around heart) Brother  . Diabetes type II Brother  . Anesthesia problems Neg Hx  . Malignant hyperthermia Neg Hx  . Pseudochol deficiency Neg Hx   Review of Systems  Review of Systems  Constitutional: Negative for chills, diaphoresis, fever, malaise/fatigue and weight loss.  HENT: Negative for congestion, ear discharge, hearing loss and tinnitus.  Eyes: Negative for blurred vision.  Respiratory: Positive for shortness of breath. Negative for cough, hemoptysis, sputum production and wheezing.  Cardiovascular: Negative for chest pain, palpitations, orthopnea, claudication, leg swelling and PND.  Gastrointestinal: Negative for abdominal pain, blood in stool, constipation, diarrhea, heartburn, melena, nausea and vomiting.  Genitourinary: Negative for dysuria, frequency, hematuria and urgency.  Musculoskeletal: Negative for back pain, falls, joint pain and myalgias.  Skin: Negative for itching and rash.  Neurological: Negative for dizziness, tingling, focal weakness,  loss of consciousness, weakness and headaches.  Endo/Heme/Allergies: Negative for polydipsia. Does not bruise/bleed easily.  Psychiatric/Behavioral: Negative for depression, memory loss and substance abuse. The patient is not nervous/anxious.   Physical Examination   Vitals:BP 118/66  Pulse 80  Ht 157.5 cm (5\' 2" )  Wt 63 kg (139 lb)  LMP (LMP Unknown)  SpO2 99%  BMI 25.42 kg/m  Ht:157.5 cm (5\' 2" ) Wt:63 kg (139 lb) [id number] surface area is 1.66 meters  squared. Body mass index is 25.42 kg/m.  Wt Readings from Last 3 Encounters:  06/13/19 63 kg (139 lb)  05/04/19 63 kg (139 lb)  04/17/19 62.1 kg (137 lb)   BP Readings from Last 3 Encounters:  06/13/19 118/66  05/04/19 132/70  04/17/19 130/70   General appearance appears in no acute distress  Head Mouth and Eye exam Normocephalic, without obvious abnormality, atraumatic Dentition is good Eyes appear anicteric   LUNGS Breath Sounds: inspiratory and expiratory rhonchi diffuse Percussion: hyperresonance diffuse  CARDIOVASCULAR JVP CV wave: no HJR: no Elevation at 90 degrees: None Carotid Pulse: normal pulsation bilaterally Bruit: None Apex: apical impulse normal  Auscultation Rhythm: normal sinus rhythm S1: normal S2: normal Clicks: no Rub: no Murmurs: no murmurs  Gallop: None ABDOMEN Liver enlargement: no Pulsatile aorta: no Ascites: no Bruits: no  EXTREMITIES Clubbing: no Edema: trace to 1+ bilateral pedal edema Pulses: peripheral pulses symmetrical Femoral Bruits: no Amputation: no SKIN Rash: no Cyanosis: no Embolic phemonenon: no Bruising: no NEURO Alert and Oriented to person, place and time: yes Non focal: yes  PSYCH: Pt appears to have normal affect  LABS REVIEWED Last 3 CBC results: Lab Results  Component Value Date  WBC 7.0 05/31/2019  WBC 6.3 01/24/2019  WBC 5.1 09/26/2018   Lab Results  Component Value Date  HGB 11.4 (L) 05/31/2019  HGB 12.9 01/24/2019  HGB 12.4 09/26/2018   Lab Results  Component Value Date  HCT 36.2 05/31/2019  HCT 39.3 01/24/2019  HCT 37.6 09/26/2018   Lab Results  Component Value Date  PLT 287 05/31/2019  PLT 182 01/24/2019  PLT 199 09/26/2018   Lab Results  Component Value Date  CREATININE 1.3 (H) 05/31/2019  BUN 16 05/31/2019  NA 140 05/31/2019  K 5.0 05/31/2019  CL 100 05/31/2019  CO2 31.5 05/31/2019   No results found for: HGBA1C  Lab Results  Component Value Date  HDL 64.0  05/31/2019  HDL 94.3 (H) 01/24/2019  HDL 80.5 09/26/2018   Lab Results  Component Value Date  LDLCALC 99 05/31/2019  LDLCALC 96 01/24/2019  LDLCALC 97 09/26/2018   Lab Results  Component Value Date  TRIG 85 05/31/2019  TRIG 91 01/24/2019  TRIG 77 09/26/2018   Lab Results  Component Value Date  ALT 11 05/31/2019  AST 22 05/31/2019  ALKPHOS 51 05/31/2019   Lab Results  Component Value Date  TSH 3.606 05/31/2019   Diagnostic Studies Reviewed:  EKG EKG demonstrated normal sinus rhythm.  Assessment and Plan   83 y.o. female with  ICD-10-CM ICD-9-CM  1. SOB (shortness of breath)-shortness of breath is likely secondary to her COPD however if she feels it is more notable. Echo showed preserved LV function however has evidence of pulmonary hypertension with an estimated RV systolic pressure of 79. No evidence of regional wall motion abnormality. Elevated right-sided pressures secondary to pulmonary disease. Recent echo in the hospital was limited views did not measure pulmonary pressures although EF was again normal. She continues to have further exertional and rest symptoms.  Continue to follow and treat medically however will proceed with right and left heart cath to evaluate coronary anatomy if renal function is adequate for this and measure pulmonary pressures to guide further therapy.. R06.02 786.05  2. Essential hypertension treat with losartan and metoprolol for blood pressure and heart rate controlled. We will continue with current regimen for now. I10 401.9   Return in about 4 weeks (around 07/11/2019).  These notes generated with voice recognition software. I apologize for typographical errors.  Catherine Levans, MD   Pt seen and examined. No change from above

## 2019-06-22 ENCOUNTER — Encounter: Payer: Self-pay | Admitting: Cardiology

## 2019-06-28 ENCOUNTER — Ambulatory Visit: Payer: Medicare Other | Admitting: Urology

## 2019-06-29 ENCOUNTER — Ambulatory Visit: Payer: Medicare Other | Admitting: Family

## 2019-07-08 ENCOUNTER — Other Ambulatory Visit: Payer: Self-pay | Admitting: Internal Medicine

## 2019-07-12 ENCOUNTER — Telehealth: Payer: Self-pay | Admitting: Internal Medicine

## 2019-07-12 MED ORDER — LEVALBUTEROL HCL 0.63 MG/3ML IN NEBU: INHALATION_SOLUTION | RESPIRATORY_TRACT | 0 refills | Status: AC

## 2019-07-12 NOTE — Telephone Encounter (Signed)
Rx for xopenex has been corrected with dx code. Sidney with [facility] is aware and voiced her understanding. Nothing further is needed.

## 2019-08-14 ENCOUNTER — Encounter: Payer: Self-pay | Admitting: Physician Assistant

## 2019-08-14 ENCOUNTER — Other Ambulatory Visit: Payer: Self-pay

## 2019-08-14 ENCOUNTER — Ambulatory Visit (INDEPENDENT_AMBULATORY_CARE_PROVIDER_SITE_OTHER): Payer: Medicare Other | Admitting: Physician Assistant

## 2019-08-14 VITALS — BP 103/62 | HR 86

## 2019-08-14 DIAGNOSIS — R310 Gross hematuria: Secondary | ICD-10-CM | POA: Diagnosis not present

## 2019-08-14 LAB — URINALYSIS, COMPLETE
Bilirubin, UA: NEGATIVE
Glucose, UA: NEGATIVE
Ketones, UA: NEGATIVE
Nitrite, UA: NEGATIVE
Specific Gravity, UA: >1.03 — ABNORMAL HIGH (ref 1.005–1.030)
Urobilinogen, Ur: 0.2 mg/dL (ref 0.2–1.0)
pH, UA: 6 (ref 5.0–7.5)

## 2019-08-14 LAB — MICROSCOPIC EXAMINATION

## 2019-08-14 LAB — BLADDER SCAN AMB NON-IMAGING

## 2019-08-14 NOTE — Progress Notes (Signed)
08/14/2019 10:45 AM   Catherine Landry 01/14/1936 [id number]  CC: Blood clots in urine  HPI: Catherine Landry is a 83 y.o. female who presents today for evaluation of blood clots per urine. She is an established [facility] patient who last saw Dr. Bernardo Heater on 03/17/2018 for cystoscopy.  Past urologic history significant for CT to muscle invasive bladder cancer, s/p left nephrectomy and subsequent left ureterectomy.  She has had a history of multiple TURBTs.  Also with a history of multiple urinary tract infections.  Last cystoscopy negative, she was due for annual follow-up in June 2020 and has not yet completed this.  Of note, she underwent drug-eluting coronary stent placement on 07/31/2019 for CAD.  She is taking Plavix and aspirin. She was discharged with concerns for her renal function following IV contrast administration (Cr rose to 1.6 from baseline 1.3), and is followed up with [facility] clinic 3 days later for further monitoring of this. At that point her creatinine had returned to baseline.  She presents today with a 1 week history of intermittent gross hematuria with the passage of clots about the size of her fingernail.  She reports dysuria with clot passage but not otherwise.  She feels she is appropriately emptying her bladder.  In-office UA today positive for 3+ blood, 3+ protein, and trace leukocyte esterase; urine microscopy with 11-30 WBCs/HPF, 11-30 RBCs/HPF, and moderate bacteria. PVR 52mL.  PMH: Past Medical History:  Diagnosis Date  . Arthritis   . Asthma   . Bladder cancer ([place]) 2015  . Cancer [facility]) 2001   kidney left   . CHF (congestive heart failure) ([place])   . COPD (chronic obstructive pulmonary disease) ([place])   . GERD (gastroesophageal reflux disease)   . Gout   . History of bladder cancer   . History of kidney cancer   . Hypertension   . Raynaud's disease   . Renal insufficiency     Surgical History: Past Surgical History:  Procedure Laterality Date  .  APPENDECTOMY    . BACK SURGERY    . CATARACT EXTRACTION    . COLONOSCOPY WITH PROPOFOL N/A 10/15/2016   Procedure: COLONOSCOPY WITH PROPOFOL;  Surgeon: Lollie Sails, MD;  [facility] ENDOSCOPY;  Service: Endoscopy;  Laterality: N/A;  . ESOPHAGOGASTRODUODENOSCOPY (EGD) WITH PROPOFOL N/A 10/15/2016   Procedure: ESOPHAGOGASTRODUODENOSCOPY (EGD) WITH PROPOFOL;  Surgeon: Lollie Sails, MD;  Location: [facility] ENDOSCOPY;  Service: Endoscopy;  Laterality: N/A;  . EYE SURGERY Left 12/2018  . NEPHRECTOMY    . RIGHT/LEFT HEART CATH AND CORONARY ANGIOGRAPHY N/A 06/21/2019   Procedure: RIGHT/LEFT HEART CATH AND CORONARY ANGIOGRAPHY;  Surgeon: Teodoro Spray, MD;  Location: [facility] CV LAB;  Service: Cardiovascular;  Laterality: N/A;    Home Medications:  Allergies as of 08/14/2019   No Known Allergies     Medication List       Accurate as of August 14, 2019 10:45 AM. If you have any questions, ask your nurse or doctor.        acetaminophen 650 MG CR tablet Commonly known as: TYLENOL Take 650 mg by mouth every 8 (eight) hours as needed for pain.   aspirin EC 81 MG tablet Take 81 mg by mouth every morning.   Calcium 600+D Plus Minerals 600-400 MG-UNIT Tabs Take 2 tablets by mouth daily.   clopidogrel 75 MG tablet Commonly known as: Plavix Take 1 tablet (75 mg total) by mouth daily.   Combivent Respimat 20-100 MCG/ACT Aers respimat Generic drug:  Ipratropium-Albuterol Inhale 1 puff into the lungs 4 (four) times daily as needed for wheezing or shortness of breath.   fluticasone 50 MCG/ACT nasal spray Commonly known as: FLONASE Place 2 sprays into both nostrils daily.   Fluticasone-Salmeterol 250-50 MCG/DOSE Aepb Commonly known as: ADVAIR Inhale 1 puff into the lungs 2 (two) times daily.   furosemide 20 MG tablet Commonly known as: LASIX Take 20 mg by mouth daily.   iron polysaccharides 150 MG capsule Commonly known as: NIFEREX Take 150 mg by mouth 2 (two) times  daily.   levalbuterol 0.63 MG/3ML nebulizer solution Commonly known as: XOPENEX INHALE THE CONTENTS OF 1 VIAL IN NEBULIZER EVERY 6 HRS AS NEEDED FOR WHEEZING OR SHORTNESS OF BREATH   losartan 50 MG tablet Commonly known as: COZAAR Take 50 mg by mouth every morning.   metoprolol succinate 25 MG 24 hr tablet Commonly known as: TOPROL-XL Take 25 mg by mouth every morning.   multivitamin with minerals Tabs tablet Take 1 tablet by mouth every morning.   nitroGLYCERIN 0.4 MG SL tablet Commonly known as: NITROSTAT   pantoprazole 40 MG tablet Commonly known as: PROTONIX Take 40 mg by mouth 2 (two) times daily.   polyethylene glycol powder 17 GM/SCOOP powder Commonly known as: GLYCOLAX/MIRALAX Take 17 g by mouth daily as needed for moderate constipation. Mix with 4 to 8 ounces of fluid.   theophylline 300 MG 24 hr capsule Commonly known as: THEO-24 Take 1 capsule (300 mg total) by mouth every morning.   vitamin B-12 1000 MCG tablet Commonly known as: CYANOCOBALAMIN Take 3,000 mcg by mouth daily.       Allergies:  No Known Allergies  Family History: Family History  Problem Relation Age of Onset  . Breast cancer Mother 68  . Stroke Mother   . Breast cancer Other 18  . Heart disease Father     Social History:   reports that she quit smoking about 19 years ago. She has a 60.00 pack-year smoking history. She has never used smokeless tobacco. She reports current alcohol use. She reports that she does not use drugs.  ROS: UROLOGY Frequent Urination?: No Hard to postpone urination?: No Burning/pain with urination?: No Get up at night to urinate?: No Leakage of urine?: No Urine stream starts and stops?: No Trouble starting stream?: No Do you have to strain to urinate?: No Blood in urine?: Yes Urinary tract infection?: No Sexually transmitted disease?: No Injury to kidneys or bladder?: No Painful intercourse?: No Weak stream?: No Currently pregnant?: No Vaginal  bleeding?: No Last menstrual period?: n  Gastrointestinal Nausea?: No Vomiting?: No Indigestion/heartburn?: No Diarrhea?: No Constipation?: No  Constitutional Fever: No Night sweats?: No Weight loss?: No Fatigue?: Yes  Skin Skin rash/lesions?: No Itching?: No  Eyes Blurred vision?: No Double vision?: No  Ears/Nose/Throat Sore throat?: No Sinus problems?: No  Hematologic/Lymphatic Swollen glands?: No Easy bruising?: Yes  Cardiovascular Leg swelling?: Yes Chest pain?: No  Respiratory Cough?: No Shortness of breath?: Yes  Endocrine Excessive thirst?: No  Musculoskeletal Back pain?: No Joint pain?: No  Neurological Headaches?: No Dizziness?: No  Psychologic Depression?: No Anxiety?: No  Physical Exam: BP 103/62   Pulse 86   Constitutional:  Alert and oriented, no acute distress, nontoxic appearing HEENT: [place], AT Cardiovascular: No clubbing, cyanosis, or edema Respiratory: Normal respiratory effort, no increased work of breathing GI: Abdomen is soft, nontender, nondistended, no abdominal masses GU: No CVA tenderness Lymph: No cervical or inguinal lymphadenopathy Skin: No rashes, bruises or  suspicious lesions Neurologic: Grossly intact, no focal deficits, moving all 4 extremities Psychiatric: Normal mood and affect  Laboratory Data: Results for orders placed or performed in visit on 08/14/19  Microscopic Examination   URINE  Result Value Ref Range   WBC, UA 11-30 (A) 0 - 5 /hpf   RBC 11-30 (A) 0 - 2 /hpf   Epithelial Cells (non renal) 0-10 0 - 10 /hpf   Mucus, UA Present (A) Not Estab.   Bacteria, UA Moderate (A) None seen/Few  Urinalysis, Complete  Result Value Ref Range   Specific Gravity, UA >1.030 (H) 1.005 - 1.030   pH, UA 6.0 5.0 - 7.5   Color, UA Yellow Yellow   Appearance Ur Cloudy (A) Clear   Leukocytes,UA Trace (A) Negative   Protein,UA 3+ (A) Negative/Trace   Glucose, UA Negative Negative   Ketones, UA Negative Negative    RBC, UA 3+ (A) Negative   Bilirubin, UA Negative Negative   Urobilinogen, Ur 0.2 0.2 - 1.0 mg/dL   Nitrite, UA Negative Negative   Microscopic Examination See below:   Bladder Scan (Post Void Residual) in office  Result Value Ref Range   Scan Result 43ml    Assessment & Plan:   1. Gross hematuria 83 year old female with an extensive history of prior urothelial carcinoma, s/p right nephrectomy, right ureterectomy, and multiple TURBTs here with a 1 week history of intermittent painless gross hematuria with passage of clots.  Recent medical history significant for cardiac stent 2 weeks ago with slight creatinine bump from baseline 1.3-1.6, now resolved; on Plavix and aspirin. Differential at this time includes recurrent urothelial carcinoma vs. anticoagulation vs. acute cystitis.  Urinalysis significant for pyuria and microscopic hematuria with moderate bacteria. Will send for culture. Given patient's age and absence irritative voiding symptoms, a positive urine culture is likely to represent chronic colonization versus acute cystitis.  Patient is overdue for her annual cystoscopy and follow-up with Dr. Bernardo Heater.  We will schedule that at this time and defer CT urogram pending this evaluation, especially given recent creatinine bump with cardiac stenting with a solitary kidney.  Given that we plan for instrumentation of this patient in the near future, will treat urine culture results regardless. - Urinalysis, Complete - Bladder Scan (Post Void Residual) in office - CULTURE, URINE COMPREHENSIVE   Return in about 2 weeks (around 08/28/2019) for Cystoscopy and annual follow-up with Dr. Bernardo Heater.  Debroah Loop, PA-C  [facility] [address], [place], [place] [address] [phone]

## 2019-08-18 LAB — CULTURE, URINE COMPREHENSIVE

## 2019-08-21 ENCOUNTER — Other Ambulatory Visit: Payer: Self-pay | Admitting: Physician Assistant

## 2019-08-21 MED ORDER — NITROFURANTOIN MONOHYD MACRO 100 MG PO CAPS: 100.0000 mg | ORAL_CAPSULE | Freq: Two times a day (BID) | ORAL | 0 refills | Status: AC

## 2019-08-21 NOTE — Progress Notes (Signed)
Patient's urine culture resulted with tetracycline-resistant E faecalis. She denies irritative voiding symptoms, however is scheduled for cystoscopy next week. Will treat regardless given plans for instrumentation. Nitrofurantoin 100mg  BID x5 days sent to her pharmacy.  I spoke with the patient via telephone to inform her of the above. She reports she continued to have gross hematuria following her office visit one week ago and took some leftover Ceftin that she found at home to treat what she suspected was a UTI. She reports resolution of gross hematuria following several doses of this med. Counseled patient to switch to prescribed Macrobid and complete the 5-day course in advance of cystoscopy. She expressed understanding.

## 2019-08-28 ENCOUNTER — Other Ambulatory Visit: Payer: Self-pay | Admitting: Physician Assistant

## 2019-08-28 ENCOUNTER — Encounter: Payer: Self-pay | Admitting: Physician Assistant

## 2019-08-28 ENCOUNTER — Telehealth: Payer: Self-pay | Admitting: Physician Assistant

## 2019-08-28 MED ORDER — AMOXICILLIN-POT CLAVULANATE 875-125 MG PO TABS: 1.0000 | ORAL_TABLET | Freq: Two times a day (BID) | ORAL | 0 refills | Status: DC

## 2019-08-28 NOTE — Telephone Encounter (Signed)
There was a message on my voicemail from Friday that this patient has been having headaches and diarrhea since she started taking the Macrobid you gave her. Ria Comment from [facility] called and left the message. I was out of the office on Friday so I did not get this message until today. She can be reached at [phone] or she said to call the patient back at her home.   Sharyn Lull

## 2019-08-28 NOTE — Telephone Encounter (Signed)
I spoke with Catherine Landry over the phone this morning.  Catherine Landry reports that she stopped taking Macrobid on Friday after a couple of doses secondary to side effects of headaches and diarrhea.  She has been taking leftover Ceftin at home in the interim.  I advised her to stop taking Ceftin.  Prescribed Augmentin twice daily x5 days in advance of her upcoming cystoscopy.  Advised her to pick this up from her pharmacy and start it today.  She expressed understanding.

## 2019-08-31 ENCOUNTER — Emergency Department: Payer: Medicare Other

## 2019-08-31 ENCOUNTER — Inpatient Hospital Stay
Admission: EM | Admit: 2019-08-31 | Discharge: 2019-09-12 | DRG: 193 | Disposition: A | Discharge status: Skilled Nursing Facility | Payer: Medicare Other | Attending: Internal Medicine | Admitting: Internal Medicine

## 2019-08-31 ENCOUNTER — Inpatient Hospital Stay
Admission: AD | Admit: 2019-08-31 | Payer: Medicare Other | Source: Other Acute Inpatient Hospital | Admitting: Internal Medicine

## 2019-08-31 ENCOUNTER — Other Ambulatory Visit: Payer: Self-pay

## 2019-08-31 DIAGNOSIS — Z0184 Encounter for antibody response examination: Secondary | ICD-10-CM

## 2019-08-31 DIAGNOSIS — J441 Chronic obstructive pulmonary disease with (acute) exacerbation: Secondary | ICD-10-CM | POA: Diagnosis not present

## 2019-08-31 DIAGNOSIS — M1990 Unspecified osteoarthritis, unspecified site: Secondary | ICD-10-CM | POA: Diagnosis present

## 2019-08-31 DIAGNOSIS — M109 Gout, unspecified: Secondary | ICD-10-CM | POA: Diagnosis present

## 2019-08-31 DIAGNOSIS — I5043 Acute on chronic combined systolic (congestive) and diastolic (congestive) heart failure: Secondary | ICD-10-CM | POA: Diagnosis not present

## 2019-08-31 DIAGNOSIS — K219 Gastro-esophageal reflux disease without esophagitis: Secondary | ICD-10-CM | POA: Diagnosis present

## 2019-08-31 DIAGNOSIS — J9601 Acute respiratory failure with hypoxia: Secondary | ICD-10-CM

## 2019-08-31 DIAGNOSIS — J159 Unspecified bacterial pneumonia: Principal | ICD-10-CM | POA: Diagnosis present

## 2019-08-31 DIAGNOSIS — I248 Other forms of acute ischemic heart disease: Secondary | ICD-10-CM | POA: Diagnosis present

## 2019-08-31 DIAGNOSIS — Z515 Encounter for palliative care: Secondary | ICD-10-CM | POA: Diagnosis not present

## 2019-08-31 DIAGNOSIS — J189 Pneumonia, unspecified organism: Secondary | ICD-10-CM

## 2019-08-31 DIAGNOSIS — J9621 Acute and chronic respiratory failure with hypoxia: Secondary | ICD-10-CM | POA: Diagnosis present

## 2019-08-31 DIAGNOSIS — D61818 Other pancytopenia: Secondary | ICD-10-CM | POA: Diagnosis not present

## 2019-08-31 DIAGNOSIS — Z803 Family history of malignant neoplasm of breast: Secondary | ICD-10-CM

## 2019-08-31 DIAGNOSIS — J1282 Pneumonia due to coronavirus disease 2019: Secondary | ICD-10-CM

## 2019-08-31 DIAGNOSIS — Z79899 Other long term (current) drug therapy: Secondary | ICD-10-CM | POA: Diagnosis not present

## 2019-08-31 DIAGNOSIS — J9691 Respiratory failure, unspecified with hypoxia: Secondary | ICD-10-CM | POA: Diagnosis present

## 2019-08-31 DIAGNOSIS — I2510 Atherosclerotic heart disease of native coronary artery without angina pectoris: Secondary | ICD-10-CM | POA: Diagnosis not present

## 2019-08-31 DIAGNOSIS — Z9981 Dependence on supplemental oxygen: Secondary | ICD-10-CM

## 2019-08-31 DIAGNOSIS — Z85528 Personal history of other malignant neoplasm of kidney: Secondary | ICD-10-CM

## 2019-08-31 DIAGNOSIS — Z823 Family history of stroke: Secondary | ICD-10-CM

## 2019-08-31 DIAGNOSIS — Z7902 Long term (current) use of antithrombotics/antiplatelets: Secondary | ICD-10-CM

## 2019-08-31 DIAGNOSIS — Z8551 Personal history of malignant neoplasm of bladder: Secondary | ICD-10-CM

## 2019-08-31 DIAGNOSIS — Z20828 Contact with and (suspected) exposure to other viral communicable diseases: Secondary | ICD-10-CM | POA: Diagnosis present

## 2019-08-31 DIAGNOSIS — I10 Essential (primary) hypertension: Secondary | ICD-10-CM | POA: Diagnosis not present

## 2019-08-31 DIAGNOSIS — T501X5A Adverse effect of loop [high-ceiling] diuretics, initial encounter: Secondary | ICD-10-CM | POA: Diagnosis not present

## 2019-08-31 DIAGNOSIS — Z7189 Other specified counseling: Secondary | ICD-10-CM

## 2019-08-31 DIAGNOSIS — Z66 Do not resuscitate: Secondary | ICD-10-CM | POA: Diagnosis not present

## 2019-08-31 DIAGNOSIS — I7300 Raynaud's syndrome without gangrene: Secondary | ICD-10-CM | POA: Diagnosis present

## 2019-08-31 DIAGNOSIS — Z8249 Family history of ischemic heart disease and other diseases of the circulatory system: Secondary | ICD-10-CM | POA: Diagnosis not present

## 2019-08-31 DIAGNOSIS — R918 Other nonspecific abnormal finding of lung field: Secondary | ICD-10-CM | POA: Diagnosis not present

## 2019-08-31 DIAGNOSIS — Z7982 Long term (current) use of aspirin: Secondary | ICD-10-CM

## 2019-08-31 DIAGNOSIS — J439 Emphysema, unspecified: Secondary | ICD-10-CM | POA: Diagnosis present

## 2019-08-31 DIAGNOSIS — R0602 Shortness of breath: Secondary | ICD-10-CM | POA: Diagnosis present

## 2019-08-31 DIAGNOSIS — N179 Acute kidney failure, unspecified: Secondary | ICD-10-CM | POA: Diagnosis present

## 2019-08-31 DIAGNOSIS — Z905 Acquired absence of kidney: Secondary | ICD-10-CM | POA: Diagnosis not present

## 2019-08-31 DIAGNOSIS — I5031 Acute diastolic (congestive) heart failure: Secondary | ICD-10-CM | POA: Diagnosis not present

## 2019-08-31 DIAGNOSIS — Z888 Allergy status to other drugs, medicaments and biological substances status: Secondary | ICD-10-CM

## 2019-08-31 DIAGNOSIS — Z87891 Personal history of nicotine dependence: Secondary | ICD-10-CM

## 2019-08-31 DIAGNOSIS — I110 Hypertensive heart disease with heart failure: Secondary | ICD-10-CM | POA: Diagnosis present

## 2019-08-31 LAB — COMPREHENSIVE METABOLIC PANEL
ALT: 11 U/L (ref 0–44)
AST: 22 U/L (ref 15–41)
Albumin: 4 g/dL (ref 3.5–5.0)
Alkaline Phosphatase: 50 U/L (ref 38–126)
Anion gap: 9 (ref 5–15)
BUN: 15 mg/dL (ref 8–23)
CO2: 34 mmol/L — ABNORMAL HIGH (ref 22–32)
Calcium: 9.4 mg/dL (ref 8.9–10.3)
Chloride: 94 mmol/L — ABNORMAL LOW (ref 98–111)
Creatinine, Ser: 1.18 mg/dL — ABNORMAL HIGH (ref 0.44–1.00)
GFR calc Af Amer: 50 mL/min — ABNORMAL LOW (ref >60)
GFR calc non Af Amer: 43 mL/min — ABNORMAL LOW (ref >60)
Glucose, Bld: 109 mg/dL — ABNORMAL HIGH (ref 70–99)
Potassium: 4.7 mmol/L (ref 3.5–5.1)
Sodium: 137 mmol/L (ref 135–145)
Total Bilirubin: 0.7 mg/dL (ref 0.3–1.2)
Total Protein: 7.9 g/dL (ref 6.5–8.1)

## 2019-08-31 LAB — URINALYSIS, COMPLETE (UACMP) WITH MICROSCOPIC
Bacteria, UA: NONE SEEN
Bilirubin Urine: NEGATIVE
Glucose, UA: NEGATIVE mg/dL
Hgb urine dipstick: NEGATIVE
Ketones, ur: NEGATIVE mg/dL
Leukocytes,Ua: NEGATIVE
Nitrite: NEGATIVE
Protein, ur: 30 mg/dL — AB
Specific Gravity, Urine: 1.014 (ref 1.005–1.030)
pH: 6 (ref 5.0–8.0)

## 2019-08-31 LAB — POC SARS CORONAVIRUS 2 AG: SARS Coronavirus 2 Ag: NEGATIVE

## 2019-08-31 LAB — CBC WITH DIFFERENTIAL/PLATELET
Abs Immature Granulocytes: 0.03 10*3/uL (ref 0.00–0.07)
Basophils Absolute: 0 10*3/uL (ref 0.0–0.1)
Basophils Relative: 1 %
Eosinophils Absolute: 0.6 10*3/uL — ABNORMAL HIGH (ref 0.0–0.5)
Eosinophils Relative: 8 %
HCT: 33.3 % — ABNORMAL LOW (ref 36.0–46.0)
Hemoglobin: 11 g/dL — ABNORMAL LOW (ref 12.0–15.0)
Immature Granulocytes: 0 %
Lymphocytes Relative: 15 %
Lymphs Abs: 1.1 10*3/uL (ref 0.7–4.0)
MCH: 30.5 pg (ref 26.0–34.0)
MCHC: 33 g/dL (ref 30.0–36.0)
MCV: 92.2 fL (ref 80.0–100.0)
Monocytes Absolute: 1 10*3/uL (ref 0.1–1.0)
Monocytes Relative: 13 %
Neutro Abs: 4.6 10*3/uL (ref 1.7–7.7)
Neutrophils Relative %: 63 %
Platelets: 158 10*3/uL (ref 150–400)
RBC: 3.61 MIL/uL — ABNORMAL LOW (ref 3.87–5.11)
RDW: 14.4 % (ref 11.5–15.5)
WBC: 7.4 10*3/uL (ref 4.0–10.5)
nRBC: 0 % (ref 0.0–0.2)

## 2019-08-31 LAB — FIBRIN DERIVATIVES D-DIMER (ARMC ONLY): Fibrin derivatives D-dimer (ARMC): 1726.27 ng/mL (FEU) — ABNORMAL HIGH (ref 0.00–499.00)

## 2019-08-31 LAB — PROCALCITONIN: Procalcitonin: <0.1 ng/mL

## 2019-08-31 LAB — BRAIN NATRIURETIC PEPTIDE: B Natriuretic Peptide: 273 pg/mL — ABNORMAL HIGH (ref 0.0–100.0)

## 2019-08-31 LAB — RESPIRATORY PANEL BY RT PCR (FLU A&B, COVID)
Influenza A by PCR: NEGATIVE
Influenza B by PCR: NEGATIVE
SARS Coronavirus 2 by RT PCR: NEGATIVE

## 2019-08-31 LAB — FERRITIN: Ferritin: 46 ng/mL (ref 11–307)

## 2019-08-31 LAB — C-REACTIVE PROTEIN: CRP: 8.3 mg/dL — ABNORMAL HIGH (ref <1.0)

## 2019-08-31 LAB — TROPONIN I (HIGH SENSITIVITY)
Troponin I (High Sensitivity): 29 ng/L — ABNORMAL HIGH (ref <18)
Troponin I (High Sensitivity): 31 ng/L — ABNORMAL HIGH (ref <18)

## 2019-08-31 MED ORDER — BUSPIRONE HCL 15 MG PO TABS
7.5000 mg | ORAL_TABLET | Freq: Two times a day (BID) | ORAL | Status: DC
  Administered 2019-08-31 – 2019-09-03 (×6): 7.5 mg via ORAL
  Filled 2019-08-31 (×7): qty 1
  Filled 2019-08-31: qty 2
  Filled 2019-08-31 (×3): qty 1
  Filled 2019-08-31: qty 2
  Filled 2019-08-31 (×3): qty 1

## 2019-08-31 MED ORDER — ENOXAPARIN SODIUM 40 MG/0.4ML ~~LOC~~ SOLN: 40.0000 mg | SUBCUTANEOUS | Status: DC

## 2019-08-31 MED ORDER — ALBUTEROL SULFATE HFA 108 (90 BASE) MCG/ACT IN AERS
4.0000 | INHALATION_SPRAY | Freq: Once | RESPIRATORY_TRACT | Status: AC
  Administered 2019-08-31: 4 via RESPIRATORY_TRACT
  Filled 2019-08-31: qty 6.7

## 2019-08-31 MED ORDER — LOSARTAN POTASSIUM 50 MG PO TABS
50.0000 mg | ORAL_TABLET | Freq: Every morning | ORAL | Status: DC
  Administered 2019-09-01: 50 mg via ORAL
  Filled 2019-08-31 (×2): qty 1

## 2019-08-31 MED ORDER — ACETAMINOPHEN 325 MG PO TABS
650.0000 mg | ORAL_TABLET | Freq: Four times a day (QID) | ORAL | Status: DC | PRN
  Administered 2019-09-02 – 2019-09-10 (×8): 650 mg via ORAL
  Filled 2019-08-31 (×8): qty 2

## 2019-08-31 MED ORDER — METHYLPREDNISOLONE SODIUM SUCC 125 MG IJ SOLR
60.0000 mg | Freq: Two times a day (BID) | INTRAMUSCULAR | Status: DC
  Administered 2019-08-31: 60 mg via INTRAVENOUS
  Filled 2019-08-31: qty 2

## 2019-08-31 MED ORDER — METOPROLOL SUCCINATE ER 25 MG PO TB24
25.0000 mg | ORAL_TABLET | Freq: Every morning | ORAL | Status: DC
  Administered 2019-09-02: 25 mg via ORAL
  Filled 2019-08-31 (×2): qty 1

## 2019-08-31 MED ORDER — DEXAMETHASONE SODIUM PHOSPHATE 10 MG/ML IJ SOLN: 8.0000 mg | Freq: Two times a day (BID) | INTRAMUSCULAR | Status: DC

## 2019-08-31 MED ORDER — POLYSACCHARIDE IRON COMPLEX 150 MG PO CAPS
150.0000 mg | ORAL_CAPSULE | Freq: Two times a day (BID) | ORAL | Status: DC
  Administered 2019-09-02 – 2019-09-03 (×4): 150 mg via ORAL
  Filled 2019-08-31 (×14): qty 1

## 2019-08-31 MED ORDER — CALCIUM CARBONATE-VITAMIN D 500-200 MG-UNIT PO TABS
2.0000 | ORAL_TABLET | Freq: Every day | ORAL | Status: DC
  Administered 2019-09-01 – 2019-09-12 (×12): 2 via ORAL
  Filled 2019-08-31 (×13): qty 2

## 2019-08-31 MED ORDER — METHYLPREDNISOLONE SODIUM SUCC 125 MG IJ SOLR
125.0000 mg | Freq: Once | INTRAMUSCULAR | Status: AC
  Administered 2019-08-31: 125 mg via INTRAVENOUS
  Filled 2019-08-31: qty 2

## 2019-08-31 MED ORDER — SODIUM CHLORIDE 0.9% FLUSH
3.0000 mL | Freq: Two times a day (BID) | INTRAVENOUS | Status: DC
  Administered 2019-09-02 – 2019-09-11 (×20): 3 mL via INTRAVENOUS

## 2019-08-31 MED ORDER — ACETAMINOPHEN 500 MG PO TABS
1000.0000 mg | ORAL_TABLET | Freq: Once | ORAL | Status: AC
  Administered 2019-08-31: 1000 mg via ORAL
  Filled 2019-08-31: qty 2

## 2019-08-31 MED ORDER — ONDANSETRON HCL 4 MG/2ML IJ SOLN: 4.0000 mg | Freq: Four times a day (QID) | INTRAMUSCULAR | Status: DC | PRN

## 2019-08-31 MED ORDER — VITAMIN B-12 1000 MCG PO TABS
3000.0000 ug | ORAL_TABLET | Freq: Every day | ORAL | Status: DC
  Administered 2019-09-01 – 2019-09-11 (×11): 3000 ug via ORAL
  Filled 2019-08-31 (×11): qty 3

## 2019-08-31 MED ORDER — THEOPHYLLINE ER 300 MG PO TB12
300.0000 mg | ORAL_TABLET | Freq: Every day | ORAL | Status: DC
  Filled 2019-08-31: qty 1

## 2019-08-31 MED ORDER — SODIUM CHLORIDE 0.9 % IV SOLN
INTRAVENOUS | Status: AC
  Administered 2019-08-31: 22:00:00 via INTRAVENOUS

## 2019-08-31 MED ORDER — NITROGLYCERIN 0.4 MG SL SUBL: 0.4000 mg | SUBLINGUAL_TABLET | SUBLINGUAL | Status: DC | PRN

## 2019-08-31 MED ORDER — FLUTICASONE PROPIONATE 50 MCG/ACT NA SUSP
2.0000 | Freq: Every day | NASAL | Status: DC
  Administered 2019-09-01 – 2019-09-12 (×12): 2 via NASAL
  Filled 2019-08-31: qty 16

## 2019-08-31 MED ORDER — IPRATROPIUM-ALBUTEROL 20-100 MCG/ACT IN AERS
1.0000 | INHALATION_SPRAY | Freq: Four times a day (QID) | RESPIRATORY_TRACT | Status: DC | PRN
  Administered 2019-09-03 – 2019-09-04 (×3): 1 via RESPIRATORY_TRACT
  Filled 2019-08-31: qty 4

## 2019-08-31 MED ORDER — VITAMIN C 500 MG PO TABS
500.0000 mg | ORAL_TABLET | Freq: Every day | ORAL | Status: DC
  Administered 2019-09-01 – 2019-09-12 (×12): 500 mg via ORAL
  Filled 2019-08-31 (×11): qty 1

## 2019-08-31 MED ORDER — ONDANSETRON HCL 4 MG PO TABS
4.0000 mg | ORAL_TABLET | Freq: Four times a day (QID) | ORAL | Status: DC | PRN
  Filled 2019-08-31: qty 1

## 2019-08-31 MED ORDER — MOMETASONE FURO-FORMOTEROL FUM 200-5 MCG/ACT IN AERO
2.0000 | INHALATION_SPRAY | Freq: Two times a day (BID) | RESPIRATORY_TRACT | Status: DC
  Administered 2019-09-01 – 2019-09-12 (×23): 2 via RESPIRATORY_TRACT
  Filled 2019-08-31: qty 8.8

## 2019-08-31 MED ORDER — CLOPIDOGREL BISULFATE 75 MG PO TABS
75.0000 mg | ORAL_TABLET | Freq: Every day | ORAL | Status: DC
  Administered 2019-08-31 – 2019-09-12 (×13): 75 mg via ORAL
  Filled 2019-08-31 (×13): qty 1

## 2019-08-31 MED ORDER — ACETAMINOPHEN 650 MG RE SUPP: 650.0000 mg | Freq: Four times a day (QID) | RECTAL | Status: DC | PRN

## 2019-08-31 MED ORDER — ASPIRIN EC 81 MG PO TBEC
81.0000 mg | DELAYED_RELEASE_TABLET | Freq: Every morning | ORAL | Status: DC
  Administered 2019-09-01 – 2019-09-12 (×12): 81 mg via ORAL
  Filled 2019-08-31 (×13): qty 1

## 2019-08-31 MED ORDER — SODIUM CHLORIDE 0.9 % IV SOLN
1.0000 g | Freq: Once | INTRAVENOUS | Status: AC
  Administered 2019-08-31: 1 g via INTRAVENOUS
  Filled 2019-08-31: qty 10

## 2019-08-31 MED ORDER — IOHEXOL 350 MG/ML SOLN
60.0000 mL | Freq: Once | INTRAVENOUS | Status: AC | PRN
  Administered 2019-08-31: 60 mL via INTRAVENOUS

## 2019-08-31 MED ORDER — FUROSEMIDE 40 MG PO TABS
20.0000 mg | ORAL_TABLET | Freq: Every day | ORAL | Status: DC
  Administered 2019-08-31: 20 mg via ORAL
  Filled 2019-08-31: qty 1

## 2019-08-31 MED ORDER — POLYETHYLENE GLYCOL 3350 17 G PO PACK
17.0000 g | PACK | Freq: Every day | ORAL | Status: DC | PRN
  Administered 2019-09-06: 17 g via ORAL
  Filled 2019-08-31 (×2): qty 1

## 2019-08-31 MED ORDER — SODIUM CHLORIDE 0.9 % IV SOLN
500.0000 mg | Freq: Once | INTRAVENOUS | Status: AC
  Administered 2019-08-31: 500 mg via INTRAVENOUS
  Filled 2019-08-31: qty 500

## 2019-08-31 MED ORDER — PANTOPRAZOLE SODIUM 40 MG PO TBEC
40.0000 mg | DELAYED_RELEASE_TABLET | Freq: Two times a day (BID) | ORAL | Status: DC
  Administered 2019-08-31 – 2019-09-12 (×23): 40 mg via ORAL
  Filled 2019-08-31 (×24): qty 1

## 2019-08-31 MED ORDER — LIDOCAINE 5 % EX PTCH
1.0000 | MEDICATED_PATCH | CUTANEOUS | Status: DC
  Administered 2019-08-31 – 2019-09-09 (×9): 1 via TRANSDERMAL
  Filled 2019-08-31 (×13): qty 1

## 2019-08-31 NOTE — ED Notes (Signed)
Sandwich tray and beverage given to pt.

## 2019-08-31 NOTE — ED Notes (Addendum)
Pt sats 61% on room air, pt placed on nonbreather at 15L and O2 sats up to 100%. Dr Charna Archer aware.

## 2019-08-31 NOTE — ED Notes (Signed)
Patient swabbed for covid x3 per Md Amin.

## 2019-08-31 NOTE — ED Notes (Signed)
Pt taken off of HFNC and placed on 6L [place]. Pt's O2 currently at 100%. MD informed

## 2019-08-31 NOTE — ED Notes (Signed)
Lab to add on tests per Levada Dy

## 2019-08-31 NOTE — ED Notes (Addendum)
Updated Biridiana Housey [phone]) daughter in law on pt's status and plan of care

## 2019-08-31 NOTE — ED Provider Notes (Signed)
[facility] Emergency Department Provider Note   ____________________________________________   First MD Initiated Contact with Patient 08/31/19 1506     (approximate)  I have reviewed the triage vital signs and the nursing notes.   HISTORY  Chief Complaint Shortness of Breath    HPI Catherine Landry is a 83 y.o. female with past medical history of COPD on 2 L nasal cannula, CHF, GERD, renal cancer status post left nephrectomy who presents to the ED complaining of shortness of breath.  Patient reports that she has been feeling increasingly short of breath over the past couple of days despite wearing her 2 L nasal cannula.  EMS was called to patient's home, where she was found to have O2 sats of 67% on 2 L.  She was subsequently placed on nonrebreather and brought to the ED for further evaluation.  Patient states she has been coughing more than usual, but she denies any fevers or chest pain.  Her husband recently tested positive for COVID-19 and had similar symptoms.        Past Medical History:  Diagnosis Date  . Arthritis   . Asthma   . Bladder cancer ([place]) 2015  . Cancer [facility]) 2001   kidney left   . CHF (congestive heart failure) ([place])   . COPD (chronic obstructive pulmonary disease) ([place])   . GERD (gastroesophageal reflux disease)   . Gout   . History of bladder cancer   . History of kidney cancer   . Hypertension   . Raynaud's disease   . Renal insufficiency     Patient Active Problem List   Diagnosis Date Noted  . Respiratory failure with hypoxia ([place]) 08/31/2019  . Respiratory failure, acute ([place]) 04/26/2019  . Elevated troponin 04/26/2019  . Personal history of bladder cancer 03/16/2018  . Acute on chronic respiratory failure with hypoxia ([place]) 11/23/2017  . UTI (lower urinary tract infection) 04/25/2016  . COPD exacerbation ([place]) 10/06/2015    Past Surgical History:  Procedure Laterality Date  . APPENDECTOMY    . BACK SURGERY     . CATARACT EXTRACTION    . COLONOSCOPY WITH PROPOFOL N/A 10/15/2016   Procedure: COLONOSCOPY WITH PROPOFOL;  Surgeon: Lollie Sails, MD;  Location: [facility] ENDOSCOPY;  Service: Endoscopy;  Laterality: N/A;  . ESOPHAGOGASTRODUODENOSCOPY (EGD) WITH PROPOFOL N/A 10/15/2016   Procedure: ESOPHAGOGASTRODUODENOSCOPY (EGD) WITH PROPOFOL;  Surgeon: Lollie Sails, MD;  Location: [facility] ENDOSCOPY;  Service: Endoscopy;  Laterality: N/A;  . EYE SURGERY Left 12/2018  . NEPHRECTOMY    . RIGHT/LEFT HEART CATH AND CORONARY ANGIOGRAPHY N/A 06/21/2019   Procedure: RIGHT/LEFT HEART CATH AND CORONARY ANGIOGRAPHY;  Surgeon: Teodoro Spray, MD;  Location: [facility] CV LAB;  Service: Cardiovascular;  Laterality: N/A;    Prior to Admission medications   Medication Sig Start Date End Date Taking? Authorizing Provider  amoxicillin-clavulanate (AUGMENTIN) 875-125 MG tablet Take 1 tablet by mouth 2 (two) times daily for 5 days. 08/28/19 09/02/19 Yes Vaillancourt, Aldona Bar, PA-C  ascorbic acid (VITAMIN C) 500 MG tablet Take 1 tablet by mouth daily.   Yes [provider]  busPIRone (BUSPAR) 7.5 MG tablet Take 7.5 mg by mouth 2 (two) times daily. 08/08/19  Yes [provider]  Calcium Carbonate-Vit D-Min (CALCIUM 600+D PLUS MINERALS) 600-400 MG-UNIT TABS Take 2 tablets by mouth daily.   Yes [provider]  clopidogrel (PLAVIX) 75 MG tablet Take 1 tablet (75 mg total) by mouth daily. 06/21/19 06/20/20 Yes Fath, Javier Docker,  MD  furosemide (LASIX) 20 MG tablet Take 20 mg by mouth daily. 07/18/19  Yes [provider]  iron polysaccharides (NIFEREX) 150 MG capsule Take 150 mg by mouth 2 (two) times daily.    Yes [provider]  levalbuterol (XOPENEX) 0.63 MG/3ML nebulizer solution INHALE THE CONTENTS OF 1 VIAL IN NEBULIZER EVERY 6 HRS AS NEEDED FOR WHEEZING OR SHORTNESS OF BREATH 07/12/19  Yes Kasa, Maretta Bees, MD  losartan (COZAAR) 50 MG tablet Take 50 mg by mouth every morning.    Yes [provider]  metoprolol succinate (TOPROL-XL) 25 MG 24 hr tablet Take 25 mg by mouth every morning.   Yes [provider]  Multiple Vitamin (MULTIVITAMIN WITH MINERALS) TABS Take 1 tablet by mouth every morning.   Yes [provider]  pantoprazole (PROTONIX) 40 MG tablet Take 40 mg by mouth 2 (two) times daily.   Yes [provider]  theophylline (THEODUR) 300 MG 12 hr tablet Take 300 mg by mouth daily. 08/08/19  Yes [provider]  vitamin B-12 (CYANOCOBALAMIN) 1000 MCG tablet Take 3,000 mcg by mouth daily.   Yes [provider]  acetaminophen (TYLENOL) 650 MG CR tablet Take 650 mg by mouth every 8 (eight) hours as needed for pain.    [provider]  aspirin EC 81 MG tablet Take 81 mg by mouth every morning.    [provider]  COMBIVENT RESPIMAT 20-100 MCG/ACT AERS respimat Inhale 1 puff into the lungs 4 (four) times daily as needed for wheezing or shortness of breath.     [provider]  fluticasone (FLONASE) 50 MCG/ACT nasal spray Place 2 sprays into both nostrils daily.    [provider]  Fluticasone-Salmeterol (ADVAIR) 250-50 MCG/DOSE AEPB Inhale 1 puff into the lungs 2 (two) times daily.    [provider]  nitroGLYCERIN (NITROSTAT) 0.4 MG SL tablet Place 0.4 mg under the tongue every 5 (five) minutes as needed for chest pain.  08/03/19   [provider]  polyethylene glycol powder (GLYCOLAX/MIRALAX) powder Take 17 g by mouth daily as needed for moderate constipation. Mix with 4 to 8 ounces of fluid. 04/10/16   [provider]  theophylline (THEO-24) 300 MG 24 hr capsule Take 1 capsule (300 mg total) by mouth every morning. Patient not taking: Reported on 08/31/2019 10/12/15   Bettey Costa, MD    Allergies Nitrofurantoin  Family History  Problem Relation Age of Onset  . Breast cancer Mother 5  . Stroke Mother   . Breast cancer Other 21  . Heart disease Father      Social History Social History   Tobacco Use  . Smoking status: Former Smoker    Packs/day: 1.50    Years: 40.00    Pack years: 60.00    Quit date: 12/09/1999    Years since quitting: 19.7  . Smokeless tobacco: Never Used  Substance Use Topics  . Alcohol use: Yes    Comment: Socally   . Drug use: No    Review of Systems  Constitutional: No fever/chills Eyes: No visual changes. ENT: No sore throat. Cardiovascular: Denies chest pain. Respiratory: Positive for cough and shortness of breath. Gastrointestinal: No abdominal pain.  No nausea, no vomiting.  No diarrhea.  No constipation. Genitourinary: Negative for dysuria. Musculoskeletal: Negative for back pain. Skin: Negative for rash. Neurological: Negative for headaches, focal weakness or numbness.  ____________________________________________   PHYSICAL EXAM:  VITAL SIGNS: ED Triage Vitals [08/31/19 1507]  Enc Vitals Group  BP      Pulse      Resp      Temp      Temp src      SpO2      Weight 121 lb (54.9 kg)     Height 5\' 2"  (1.575 m)     Head Circumference      Peak Flow      Pain Score 0     Pain Loc      Pain Edu?      Excl. in [place]?     Constitutional: Alert and oriented. Eyes: Conjunctivae are normal. Head: Atraumatic. Nose: No congestion/rhinnorhea. Mouth/Throat: Mucous membranes are moist. Neck: Normal ROM Cardiovascular: Tachycardic, regular rhythm. Grossly normal heart sounds. Respiratory: Tachypneic and in moderate respiratory distress with increased respiratory effort.  Lungs with poor air movement and expiratory wheezing.. Gastrointestinal: Soft and nontender. No distention. Genitourinary: deferred Musculoskeletal: No lower extremity tenderness nor edema. Neurologic:  Normal speech and language. No gross focal neurologic deficits are appreciated. Skin:  Skin is warm, dry and intact. No rash noted. Psychiatric: Mood and affect are normal. Speech and behavior are  normal.  ____________________________________________   LABS (all labs ordered are listed, but only abnormal results are displayed)  Labs Reviewed  COMPREHENSIVE METABOLIC PANEL - Abnormal; Notable for the following components:      Result Value   Chloride 94 (*)    CO2 34 (*)    Glucose, Bld 109 (*)    Creatinine, Ser 1.18 (*)    GFR calc non Af Amer 43 (*)    GFR calc Af Amer 50 (*)    All other components within normal limits  CBC WITH DIFFERENTIAL/PLATELET - Abnormal; Notable for the following components:   RBC 3.61 (*)    Hemoglobin 11.0 (*)    HCT 33.3 (*)    Eosinophils Absolute 0.6 (*)    All other components within normal limits  FIBRIN DERIVATIVES D-DIMER ([facility] ONLY) - Abnormal; Notable for the following components:   Fibrin derivatives D-dimer [facility]) [id number] (*)    All other components within normal limits  BRAIN NATRIURETIC PEPTIDE - Abnormal; Notable for the following components:   B Natriuretic Peptide 273.0 (*)    All other components within normal limits  URINALYSIS, COMPLETE (UACMP) WITH MICROSCOPIC - Abnormal; Notable for the following components:   Color, Urine YELLOW (*)    APPearance HAZY (*)    Protein, ur 30 (*)    All other components within normal limits  C-REACTIVE PROTEIN - Abnormal; Notable for the following components:   CRP 8.3 (*)    All other components within normal limits  TROPONIN I (HIGH SENSITIVITY) - Abnormal; Notable for the following components:   Troponin I (High Sensitivity) 29 (*)    All other components within normal limits  TROPONIN I (HIGH SENSITIVITY) - Abnormal; Notable for the following components:   Troponin I (High Sensitivity) 31 (*)    All other components within normal limits  RESPIRATORY PANEL BY RT PCR (FLU A&B, COVID)  SARS CORONAVIRUS 2 (TAT 6-24 HRS)  PROCALCITONIN  FERRITIN  CBC  BASIC METABOLIC PANEL  CBC  POC SARS CORONAVIRUS 2 AG -  ED  POC SARS CORONAVIRUS 2 AG    ____________________________________________  EKG  ED ECG REPORT I, Blake Divine, the attending physician, personally viewed and interpreted this ECG.   Date: 08/31/2019  EKG Time: 15:19  Rate: 98  Rhythm: sinus tachycardia  Axis: LAD  Intervals:left bundle branch block  ST&T Change: Old LBBB, negative Sgarbossa   PROCEDURES  Procedure(s) performed (including Critical Care):  .Critical Care Performed by: Blake Divine, MD Authorized by: Blake Divine, MD   Critical care provider statement:    Critical care time (minutes):  45   Critical care time was exclusive of:  Separately billable procedures and treating other patients and teaching time   Critical care was necessary to treat or prevent imminent or life-threatening deterioration of the following conditions:  Respiratory failure   Critical care was time spent personally by me on the following activities:  Discussions with consultants, evaluation of patient's response to treatment, examination of patient, ordering and performing treatments and interventions, ordering and review of laboratory studies, ordering and review of radiographic studies, pulse oximetry, re-evaluation of patient's condition, obtaining history from patient or surrogate and review of old charts   I assumed direction of critical care for this patient from another provider in my specialty: no       ____________________________________________   INITIAL IMPRESSION / ASSESSMENT AND PLAN / ED COURSE      83 year old female presents to the ED with increasing shortness of breath and productive cough over the past 2 to 3 days.  She arrives in moderate respiratory distress, but maintaining O2 sats on nonrebreather.  She was transitioned to high flow nasal cannula, given albuterol and IV dose of Solu-Medrol.  She had gradual improvement in her work of breathing and is maintaining O2 sats on high flow nasal cannula.  I would have a very high suspicion for  COVID-19 in this patient as her chest x-ray shows a multifocal pneumonia and her husband recently tested positive for COVID-19.  Lower suspicion for bacterial pneumonia and sepsis, will hold off on antibiotics for now and check procalcitonin.  EKG shows old left bundle branch block with no acute ischemic changes and troponin is mildly elevated, low suspicion for ACS.  Plan for discussion with [facility] regarding possible transfer.   COVID-19 testing is unexpectedly negative.  CTA was performed and is negative for PE, but does show diffuse groundglass opacities concerning for pneumonia.  Given negative COVID-19 testing, we will cover with antibiotics for possible bacterial pneumonia.  Given high suspicion for COVID-19 despite negative testing, we will maintain isolation precautions.  Patient's work of breathing remains improved and she was able to be transitioned to 6 L nasal cannula.  Case discussed with hospitalist, who accepts patient for admission.      ____________________________________________   FINAL CLINICAL IMPRESSION(S) / ED DIAGNOSES  Final diagnoses:  COPD exacerbation ([place])  Multifocal pneumonia  Acute respiratory failure with hypoxia [facility])     ED Discharge Orders    None       Note:  This document was prepared using Dragon voice recognition software and may include unintentional dictation errors.   Blake Divine, MD 08/31/19 2040

## 2019-08-31 NOTE — ED Notes (Signed)
x2 blood cultures sent to lab.

## 2019-08-31 NOTE — ED Notes (Signed)
Pharmacy called and will send up inhaler

## 2019-08-31 NOTE — ED Notes (Signed)
Called Pharmacy and waiting on them to send lovenox

## 2019-08-31 NOTE — H&P (Addendum)
History and Physical    [name] [id number] DOB: 1936-02-18 DOA: 08/31/2019  PCP: Tracie Harrier, MD   Patient coming from: Home  I have personally briefly reviewed patient's old medical records in [place]  Chief Complaint: Worsening shortness of breath.  HPI: Catherine Landry is a 83 y.o. female with medical history significant of Catherine Landry is a 83 y.o. female with past medical history of COPD on 2 L nasal cannula, CHF, GERD, renal cancer status post left nephrectomy who presents to the ED complaining of shortness of breath.  Per patient she is experiencing worsening shortness of breath spite using her home oxygen.  Her husband recently tested positive for COVID-19 and had similar symptoms.  She was brought to ED today for further evaluation.  Patient was complaining of worsening cough and shortness of breath for few days.  She was complaining of subjective fever and chills.  Having nausea, vomiting and diarrhea.  Diarrhea resolved today but she continued to experience nausea.  Complaining of headache.  Denies any change in her sense of smell or taste.  Denies any recent change in her weight or swelling.  She denies any orthopnea or PND.  Denies any chest pain.  No urinary symptoms.  ED Course: On arrival to ED patient was mildly tachycardic, tachypneic and saturating in 60s with 2 L.  She was placed on nonrebreather which improved her saturation.  Later transitioned to high flow nasal cannula.  Initial POC COVID-19 testing was negative but chest x-ray shows bilateral infiltrate consistent with COVID-19 pneumonia.  Respiratory viral panel negative. D-dimer was elevated at 1726.  Ferritin within normal limit, BNP mildly elevated at 273.  Troponin positive at 29.  Review of Systems: As per HPI otherwise 10 point review of systems negative.   Past Medical History:  Diagnosis Date  . Arthritis   . Asthma   . Bladder cancer ([place]) 2015  . Cancer [facility]) 2001     kidney left   . CHF (congestive heart failure) ([place])   . COPD (chronic obstructive pulmonary disease) ([place])   . GERD (gastroesophageal reflux disease)   . Gout   . History of bladder cancer   . History of kidney cancer   . Hypertension   . Raynaud's disease   . Renal insufficiency     Past Surgical History:  Procedure Laterality Date  . APPENDECTOMY    . BACK SURGERY    . CATARACT EXTRACTION    . COLONOSCOPY WITH PROPOFOL N/A 10/15/2016   Procedure: COLONOSCOPY WITH PROPOFOL;  Surgeon: Lollie Sails, MD;  Location: [facility] ENDOSCOPY;  Service: Endoscopy;  Laterality: N/A;  . ESOPHAGOGASTRODUODENOSCOPY (EGD) WITH PROPOFOL N/A 10/15/2016   Procedure: ESOPHAGOGASTRODUODENOSCOPY (EGD) WITH PROPOFOL;  Surgeon: Lollie Sails, MD;  Location: [facility] ENDOSCOPY;  Service: Endoscopy;  Laterality: N/A;  . EYE SURGERY Left 12/2018  . NEPHRECTOMY    . RIGHT/LEFT HEART CATH AND CORONARY ANGIOGRAPHY N/A 06/21/2019   Procedure: RIGHT/LEFT HEART CATH AND CORONARY ANGIOGRAPHY;  Surgeon: Teodoro Spray, MD;  Location: [place] CV LAB;  Service: Cardiovascular;  Laterality: N/A;     reports that she quit smoking about 19 years ago. She has a 60.00 pack-year smoking history. She has never used smokeless tobacco. She reports current alcohol use. She reports that she does not use drugs.  Allergies  Allergen Reactions  . Nitrofurantoin Diarrhea and Other (See Comments)    Headache    Family History  Problem Relation Age of Onset  .  Breast cancer Mother 83  . Stroke Mother   . Breast cancer Other 61  . Heart disease Father     Prior to Admission medications   Medication Sig Start Date End Date Taking? Authorizing Provider  amoxicillin-clavulanate (AUGMENTIN) 875-125 MG tablet Take 1 tablet by mouth 2 (two) times daily for 5 days. 08/28/19 09/02/19 Yes Vaillancourt, Aldona Bar, PA-C  ascorbic acid (VITAMIN C) 500 MG tablet Take 1 tablet by mouth daily.   Yes [provider]   busPIRone (BUSPAR) 7.5 MG tablet Take 7.5 mg by mouth 2 (two) times daily. 08/08/19  Yes [provider]  Calcium Carbonate-Vit D-Min (CALCIUM 600+D PLUS MINERALS) 600-400 MG-UNIT TABS Take 2 tablets by mouth daily.   Yes [provider]  clopidogrel (PLAVIX) 75 MG tablet Take 1 tablet (75 mg total) by mouth daily. 06/21/19 06/20/20 Yes FathJavier Docker, MD  furosemide (LASIX) 20 MG tablet Take 20 mg by mouth daily. 07/18/19  Yes [provider]  iron polysaccharides (NIFEREX) 150 MG capsule Take 150 mg by mouth 2 (two) times daily.    Yes [provider]  levalbuterol (XOPENEX) 0.63 MG/3ML nebulizer solution INHALE THE CONTENTS OF 1 VIAL IN NEBULIZER EVERY 6 HRS AS NEEDED FOR WHEEZING OR SHORTNESS OF BREATH 07/12/19  Yes Kasa, Maretta Bees, MD  losartan (COZAAR) 50 MG tablet Take 50 mg by mouth every morning.   Yes [provider]  metoprolol succinate (TOPROL-XL) 25 MG 24 hr tablet Take 25 mg by mouth every morning.   Yes [provider]  Multiple Vitamin (MULTIVITAMIN WITH MINERALS) TABS Take 1 tablet by mouth every morning.   Yes [provider]  pantoprazole (PROTONIX) 40 MG tablet Take 40 mg by mouth 2 (two) times daily.   Yes [provider]  theophylline (THEODUR) 300 MG 12 hr tablet Take 300 mg by mouth daily. 08/08/19  Yes [provider]  vitamin B-12 (CYANOCOBALAMIN) 1000 MCG tablet Take 3,000 mcg by mouth daily.   Yes [provider]  acetaminophen (TYLENOL) 650 MG CR tablet Take 650 mg by mouth every 8 (eight) hours as needed for pain.    [provider]  aspirin EC 81 MG tablet Take 81 mg by mouth every morning.    [provider]  COMBIVENT RESPIMAT 20-100 MCG/ACT AERS respimat Inhale 1 puff into the lungs 4 (four) times daily as needed for wheezing or shortness of breath.     [provider]  fluticasone (FLONASE) 50 MCG/ACT nasal spray Place 2 sprays into both nostrils daily.     [provider]  Fluticasone-Salmeterol (ADVAIR) 250-50 MCG/DOSE AEPB Inhale 1 puff into the lungs 2 (two) times daily.    [provider]  nitroGLYCERIN (NITROSTAT) 0.4 MG SL tablet Place 0.4 mg under the tongue every 5 (five) minutes as needed for chest pain.  08/03/19   [provider]  polyethylene glycol powder (GLYCOLAX/MIRALAX) powder Take 17 g by mouth daily as needed for moderate constipation. Mix with 4 to 8 ounces of fluid. 04/10/16   [provider]  theophylline (THEO-24) 300 MG 24 hr capsule Take 1 capsule (300 mg total) by mouth every morning. Patient not taking: Reported on 08/31/2019 10/12/15   Bettey Costa, MD    Physical Exam: Vitals:   08/31/19 1536 08/31/19 1600 08/31/19 1700 08/31/19 1730  BP:  126/66 (!) 119/57 (!) 112/59  Pulse: 92 90 90   Resp: (!) 32 (!) 25 (!) 26 19  Temp:  TempSrc:      SpO2: 100% 100% 100%   Weight:      Height:        Constitutional: NAD, calm, comfortable Vitals:   08/31/19 1536 08/31/19 1600 08/31/19 1700 08/31/19 1730  BP:  126/66 (!) 119/57 (!) 112/59  Pulse: 92 90 90   Resp: (!) 32 (!) 25 (!) 26 19  Temp:      TempSrc:      SpO2: 100% 100% 100%   Weight:      Height:       Eyes: PERRL, lids and conjunctivae normal ENMT: Mucous membranes are moist. Posterior pharynx clear of any exudate or lesions.Normal dentition.  Neck: normal, supple, no masses, no thyromegaly Respiratory: Bilateral scattered wheeze with mildly decreased air entry. Cardiovascular: Regular rate and rhythm, no murmurs / rubs / gallops. No extremity edema. 2+ pedal pulses. No carotid bruits.  Abdomen: no tenderness, no masses palpated. No hepatosplenomegaly. Bowel sounds positive.  Musculoskeletal: no clubbing / cyanosis. No joint deformity upper and lower extremities. Good ROM, no contractures. Normal muscle tone.  Skin: no rashes, lesions, ulcers. No induration Neurologic: CN 2-12 grossly intact. Sensation intact, DTR  normal. Strength 5/5 in all 4.  Psychiatric: Normal judgment and insight. Alert and oriented x 3. Normal mood.   Labs on Admission: I have personally reviewed following labs and imaging studies  CBC: Recent Labs  Lab 08/31/19 1512  WBC 7.4  NEUTROABS 4.6  HGB 11.0*  HCT 33.3*  MCV 92.2  PLT 0000000   Basic Metabolic Panel: Recent Labs  Lab 08/31/19 1512  NA 137  K 4.7  CL 94*  CO2 34*  GLUCOSE 109*  BUN 15  CREATININE 1.18*  CALCIUM 9.4   GFR: Estimated Creatinine Clearance: 29.6 mL/min (A) (by C-G formula based on SCr of 1.18 mg/dL (H)). Liver Function Tests: Recent Labs  Lab 08/31/19 1512  AST 22  ALT 11  ALKPHOS 50  BILITOT 0.7  PROT 7.9  ALBUMIN 4.0   No results for input(s): LIPASE, AMYLASE in the last 168 hours. No results for input(s): AMMONIA in the last 168 hours. Coagulation Profile: No results for input(s): INR, PROTIME in the last 168 hours. Cardiac Enzymes: No results for input(s): CKTOTAL, CKMB, CKMBINDEX, TROPONINI in the last 168 hours. BNP (last 3 results) No results for input(s): PROBNP in the last 8760 hours. HbA1C: No results for input(s): HGBA1C in the last 72 hours. CBG: No results for input(s): GLUCAP in the last 168 hours. Lipid Profile: No results for input(s): CHOL, HDL, LDLCALC, TRIG, CHOLHDL, LDLDIRECT in the last 72 hours. Thyroid Function Tests: No results for input(s): TSH, T4TOTAL, FREET4, T3FREE, THYROIDAB in the last 72 hours. Anemia Panel: Recent Labs    08/31/19 1512  FERRITIN 46   Urine analysis:    Component Value Date/Time   COLORURINE YELLOW (A) 08/31/2019 1635   APPEARANCEUR HAZY (A) 08/31/2019 1635   APPEARANCEUR Cloudy (A) 08/14/2019 1021   LABSPEC 1.014 08/31/2019 1635   PHURINE 6.0 08/31/2019 1635   GLUCOSEU NEGATIVE 08/31/2019 1635   HGBUR NEGATIVE 08/31/2019 1635   BILIRUBINUR NEGATIVE 08/31/2019 1635   BILIRUBINUR Negative 08/14/2019 [place] 08/31/2019 1635   PROTEINUR 30 (A)  08/31/2019 1635   NITRITE NEGATIVE 08/31/2019 1635   LEUKOCYTESUR NEGATIVE 08/31/2019 1635    Radiological Exams on Admission: DG Chest Portable 1 View  Result Date: 08/31/2019 CLINICAL DATA:  Difficulty breathing today. Patient's husband is COVID-19 positive. EXAM: PORTABLE CHEST 1 VIEW  COMPARISON:  April 27, 2019 FINDINGS: The mediastinal contour is normal. The heart size is upper limits of normal. Minimal increased pulmonary interstitium with minimal hazy ground-glass opacity is identified in the bilateral lungs. There is no pleural effusion. The bony structures are stable. IMPRESSION: Minimal increased pulmonary interstitium with minimal hazy ground-glass opacity is identified in the bilateral lungs. This can be seen in early pneumonia. Electronically Signed   By: Abelardo Diesel M.D.   On: 08/31/2019 15:43    EKG: Independently reviewed.  Sinus rhythm with Q waves in inferolateral leads.  No acute change.  Assessment/Plan Active Problems:   * No active hospital problems. *   Acute hypoxic respiratory failure.  Although initial COVID-19 test is negative.  Patient is high risk due to sick contact as her husband recently diagnosed with COVID-19 pneumonia.  Chest x-ray with bilateral infiltrate consistent with COVID-19 pneumonia.  Patient is high risk because of underlying COPD. -We will do CTA as D-dimer is markedly elevated to rule out PE. -Solu-Medrol 60 mg twice daily. -Continue home inhalers. -Ceftriaxone and azithromycin for possible CAP. -Repeat COVID-19 testing as  suspicion is high. -CRP pending. -Procalcitonin <0.10 -CTA with bilateral groundglass opacities consistent with COVID-19 pneumonia. Starting her on remdesivir because of high suspicion and patient is high risk for deterioration.  CAD with HFrEF.  Recent right and left cardiac catheterization.  Patient fraction of 45 to 50%, and 100% stenosis of proximal circumflex, 99% stenosis of RCA.  She was high risk for PCI and was  advised to continue with dual antiplatelets. BNP mildly elevated. Mildly elevated troponin-most likely demand. Does not appear volume overload.  AKI.  Mildly elevated creatinine at 1.18. -Give her some fluids that patient is going for CTA. -Avoid nephrotoxic. -BMP tomorrow  DVT prophylaxis: Lovenox Code Status: Full code Family Communication: No family at bedside.  Daughter-in-law was updated from ED. Disposition Plan: Pending improvement Consults called: None Admission status: Inpatient   Lorella Nimrod MD [facility] Pager [phone]  If 7PM-7AM, please contact night-coverage [email or web address] Password [facility]  08/31/2019, 5:45 PM   This record has been created using Systems analyst. Errors have been sought and corrected,but may not always be located. Such creation errors do not reflect on the standard of care.

## 2019-08-31 NOTE — ED Notes (Signed)
Pt's daughter-in-law Juliann Pulse updated on pt status

## 2019-08-31 NOTE — ED Notes (Signed)
Pt had a saturated brief. Pt cleaned up and repositioned in bed at this time

## 2019-08-31 NOTE — ED Triage Notes (Signed)
Pt here from home via [facility] for breathing difficulty. Pt sats 67% on 2L [place]- now 100% on nonrebreather 15L. Pt wears 2L chronically.  Husband is covid positive.  Ems VS- T98.6, 167/88, 105 HR

## 2019-08-31 NOTE — ED Notes (Signed)
Pt in Ct  

## 2019-09-01 ENCOUNTER — Encounter: Payer: Self-pay | Admitting: Internal Medicine

## 2019-09-01 DIAGNOSIS — I10 Essential (primary) hypertension: Secondary | ICD-10-CM

## 2019-09-01 DIAGNOSIS — J189 Pneumonia, unspecified organism: Secondary | ICD-10-CM

## 2019-09-01 DIAGNOSIS — N179 Acute kidney failure, unspecified: Secondary | ICD-10-CM | POA: Insufficient documentation

## 2019-09-01 LAB — BASIC METABOLIC PANEL
Anion gap: 5 (ref 5–15)
BUN: 22 mg/dL (ref 8–23)
CO2: 33 mmol/L — ABNORMAL HIGH (ref 22–32)
Calcium: 8.7 mg/dL — ABNORMAL LOW (ref 8.9–10.3)
Chloride: 99 mmol/L (ref 98–111)
Creatinine, Ser: 1.4 mg/dL — ABNORMAL HIGH (ref 0.44–1.00)
GFR calc Af Amer: 41 mL/min — ABNORMAL LOW (ref >60)
GFR calc non Af Amer: 35 mL/min — ABNORMAL LOW (ref >60)
Glucose, Bld: 233 mg/dL — ABNORMAL HIGH (ref 70–99)
Potassium: 5.5 mmol/L — ABNORMAL HIGH (ref 3.5–5.1)
Sodium: 137 mmol/L (ref 135–145)

## 2019-09-01 LAB — CBC
HCT: 32.8 % — ABNORMAL LOW (ref 36.0–46.0)
Hemoglobin: 10.3 g/dL — ABNORMAL LOW (ref 12.0–15.0)
MCH: 30.4 pg (ref 26.0–34.0)
MCHC: 31.4 g/dL (ref 30.0–36.0)
MCV: 96.8 fL (ref 80.0–100.0)
Platelets: 115 10*3/uL — ABNORMAL LOW (ref 150–400)
RBC: 3.39 MIL/uL — ABNORMAL LOW (ref 3.87–5.11)
RDW: 14.3 % (ref 11.5–15.5)
WBC: 3.2 10*3/uL — ABNORMAL LOW (ref 4.0–10.5)
nRBC: 0 % (ref 0.0–0.2)

## 2019-09-01 LAB — SARS CORONAVIRUS 2 (TAT 6-24 HRS): SARS Coronavirus 2: NEGATIVE

## 2019-09-01 LAB — POTASSIUM: Potassium: 4.8 mmol/L (ref 3.5–5.1)

## 2019-09-01 MED ORDER — SODIUM CHLORIDE 0.9 % IV SOLN
100.0000 mg | Freq: Every day | INTRAVENOUS | Status: AC
  Administered 2019-09-02 – 2019-09-05 (×4): 100 mg via INTRAVENOUS
  Filled 2019-09-01: qty 20
  Filled 2019-09-01 (×3): qty 100

## 2019-09-01 MED ORDER — DEXAMETHASONE SODIUM PHOSPHATE 10 MG/ML IJ SOLN
6.0000 mg | INTRAMUSCULAR | Status: DC
  Administered 2019-09-01 – 2019-09-04 (×4): 6 mg via INTRAVENOUS
  Filled 2019-09-01 (×4): qty 0.6
  Filled 2019-09-01: qty 1

## 2019-09-01 MED ORDER — INFLUENZA VAC A&B SA ADJ QUAD 0.5 ML IM PRSY
0.5000 mL | PREFILLED_SYRINGE | INTRAMUSCULAR | Status: DC
  Filled 2019-09-01: qty 0.5

## 2019-09-01 MED ORDER — SODIUM CHLORIDE 0.9 % IV SOLN
200.0000 mg | Freq: Once | INTRAVENOUS | Status: AC
  Administered 2019-09-01: 200 mg via INTRAVENOUS
  Filled 2019-09-01: qty 200

## 2019-09-01 MED ORDER — ENOXAPARIN SODIUM 40 MG/0.4ML ~~LOC~~ SOLN
30.0000 mg | SUBCUTANEOUS | Status: DC
  Administered 2019-09-01 – 2019-09-03 (×3): 30 mg via SUBCUTANEOUS
  Filled 2019-09-01 (×3): qty 0.4

## 2019-09-01 NOTE — ED Notes (Addendum)
ED TO INPATIENT HANDOFF REPORT  ED Nurse Name and Phone #:  Tom RN  S Name/Age/Gender Catherine Landry 83 y.o. female Room/Bed: ED36A/ED36A  Code Status   Code Status: Full Code  Home/SNF/Other Home Patient oriented to: self, place, time and situation Is this baseline? Yes   Triage Complete: Triage complete  Chief Complaint Respiratory failure with hypoxia [facility]) [J96.91]  Triage Note Pt here from home via [facility] for breathing difficulty. Pt sats 67% on 2L [place]- now 100% on nonrebreather 15L. Pt wears 2L chronically.  Husband is covid positive.  Ems VS- T98.6, 167/88, 105 HR    Allergies Allergies  Allergen Reactions  . Nitrofurantoin Diarrhea and Other (See Comments)    Headache    Level of Care/Admitting Diagnosis ED Disposition    ED Disposition Condition [facility]: [place] [[id number]]  Level of Care: Telemetry [5]  Covid Evaluation: Symptomatic Person Under Investigation (PUI)  Diagnosis: Respiratory failure with hypoxia [id number]  Admitting Physician: Lorella Nimrod [id number]  Attending Physician: Lorella Nimrod [id number]  Estimated length of stay: past midnight tomorrow  Certification:: I certify this patient will need inpatient services for at least 2 midnights       B Medical/Surgery History Past Medical History:  Diagnosis Date  . Arthritis   . Asthma   . Bladder cancer ([place]) 2015  . Cancer [facility]) 2001   kidney left   . CHF (congestive heart failure) ([place])   . COPD (chronic obstructive pulmonary disease) ([place])   . GERD (gastroesophageal reflux disease)   . Gout   . History of bladder cancer   . History of kidney cancer   . Hypertension   . Raynaud's disease   . Renal insufficiency    Past Surgical History:  Procedure Laterality Date  . APPENDECTOMY    . BACK SURGERY    . CATARACT EXTRACTION    . COLONOSCOPY WITH PROPOFOL N/A 10/15/2016   Procedure: COLONOSCOPY WITH PROPOFOL;  Surgeon:  Lollie Sails, MD;  Location: [facility] ENDOSCOPY;  Service: Endoscopy;  Laterality: N/A;  . ESOPHAGOGASTRODUODENOSCOPY (EGD) WITH PROPOFOL N/A 10/15/2016   Procedure: ESOPHAGOGASTRODUODENOSCOPY (EGD) WITH PROPOFOL;  Surgeon: Lollie Sails, MD;  Location: [facility] ENDOSCOPY;  Service: Endoscopy;  Laterality: N/A;  . EYE SURGERY Left 12/2018  . NEPHRECTOMY    . RIGHT/LEFT HEART CATH AND CORONARY ANGIOGRAPHY N/A 06/21/2019   Procedure: RIGHT/LEFT HEART CATH AND CORONARY ANGIOGRAPHY;  Surgeon: Teodoro Spray, MD;  Location: [facility] CV LAB;  Service: Cardiovascular;  Laterality: N/A;     A IV Location/Drains/Wounds Patient Lines/Drains/Airways Status   Active Line/Drains/Airways    Name:   Placement date:   Placement time:   Site:   Days:   Peripheral IV 08/31/19 Left Wrist   08/31/19    1510    Wrist   1   Peripheral IV 08/31/19 Right Antecubital   08/31/19    1532    Antecubital   1   External Urinary Catheter   08/31/19    1715    --   1   Airway   10/15/16    0800     1051          Intake/Output Last 24 hours No intake or output data in the 24 hours ending 09/01/19 2022  Labs/Imaging Results for orders placed or performed during the hospital encounter of 08/31/19 (from the past 48 hour(s))  Comprehensive metabolic panel     Status: Abnormal  Collection Time: 08/31/19  3:12 PM  Result Value Ref Range   Sodium 137 135 - 145 mmol/L   Potassium 4.7 3.5 - 5.1 mmol/L   Chloride 94 (L) 98 - 111 mmol/L   CO2 34 (H) 22 - 32 mmol/L   Glucose, Bld 109 (H) 70 - 99 mg/dL   BUN 15 8 - 23 mg/dL   Creatinine, Ser 1.18 (H) 0.44 - 1.00 mg/dL   Calcium 9.4 8.9 - 10.3 mg/dL   Total Protein 7.9 6.5 - 8.1 g/dL   Albumin 4.0 3.5 - 5.0 g/dL   AST 22 15 - 41 U/L   ALT 11 0 - 44 U/L   Alkaline Phosphatase 50 38 - 126 U/L   Total Bilirubin 0.7 0.3 - 1.2 mg/dL   GFR calc non Af Amer 43 (L) >60 mL/min   GFR calc Af Amer 50 (L) >60 mL/min   Anion gap 9 5 - 15    Comment: Performed at [facility], [address] [place], [place] [address]  CBC with Differential     Status: Abnormal   Collection Time: 08/31/19  3:12 PM  Result Value Ref Range   WBC 7.4 4.0 - 10.5 K/uL   RBC 3.61 (L) 3.87 - 5.11 MIL/uL   Hemoglobin 11.0 (L) 12.0 - 15.0 g/dL   HCT 33.3 (L) 36.0 - 46.0 %   MCV 92.2 80.0 - 100.0 fL   MCH 30.5 26.0 - 34.0 pg   MCHC 33.0 30.0 - 36.0 g/dL   RDW 14.4 11.5 - 15.5 %   Platelets 158 150 - 400 K/uL   nRBC 0.0 0.0 - 0.2 %   Neutrophils Relative % 63 %   Neutro Abs 4.6 1.7 - 7.7 K/uL   Lymphocytes Relative 15 %   Lymphs Abs 1.1 0.7 - 4.0 K/uL   Monocytes Relative 13 %   Monocytes Absolute 1.0 0.1 - 1.0 K/uL   Eosinophils Relative 8 %   Eosinophils Absolute 0.6 (H) 0.0 - 0.5 K/uL   Basophils Relative 1 %   Basophils Absolute 0.0 0.0 - 0.1 K/uL   Immature Granulocytes 0 %   Abs Immature Granulocytes 0.03 0.00 - 0.07 K/uL    Comment: Performed at [facility], [address] [place], [place] [address]  Fibrin derivatives D-Dimer     Status: Abnormal   Collection Time: 08/31/19  3:12 PM  Result Value Ref Range   Fibrin derivatives D-dimer ([facility]) 1,726.27 (H) 0.00 - 499.00 ng/mL (FEU)    Comment: (NOTE) <> Exclusion of Venous Thromboembolism (VTE) - OUTPATIENT ONLY   (Emergency Department or [place])   0-499 ng/ml (FEU): With a low to intermediate pretest probability                      for VTE this test result excludes the diagnosis                      of VTE.   >499 ng/ml (FEU) : VTE not excluded; additional work up for VTE is                      required. <> Testing on Inpatients and Evaluation of Disseminated Intravascular   Coagulation (DIC) Reference Range:   0-499 ng/ml (FEU) Performed at [facility], [address], [place], [place] [address]   Troponin I (High Sensitivity)     Status: Abnormal   Collection Time: 08/31/19  3:12 PM  Result Value Ref Range   Troponin I (High Sensitivity) 29 (H) <18 ng/L    Comment:  (NOTE) Elevated high sensitivity troponin I (hsTnI) values and significant  changes across serial measurements may suggest ACS but many other  chronic and acute conditions are known to elevate hsTnI results.  Refer to the "Links" section for chest pain algorithms and additional  guidance. Performed at [facility], [address] [place], [place] [address]   Brain natriuretic peptide     Status: Abnormal   Collection Time: 08/31/19  3:12 PM  Result Value Ref Range   B Natriuretic Peptide 273.0 (H) 0.0 - 100.0 pg/mL    Comment: Performed at [facility], [address] [place], [place] [address]  Procalcitonin - Baseline     Status: None   Collection Time: 08/31/19  3:12 PM  Result Value Ref Range   Procalcitonin <0.10 ng/mL    Comment:        Interpretation: PCT (Procalcitonin) <= 0.5 ng/mL: Systemic infection (sepsis) is not likely. Local bacterial infection is possible. (NOTE)       Sepsis PCT Algorithm           Lower Respiratory Tract                                      Infection PCT Algorithm    ----------------------------     ----------------------------         PCT < 0.25 ng/mL                PCT < 0.10 ng/mL         Strongly encourage             Strongly discourage   discontinuation of antibiotics    initiation of antibiotics    ----------------------------     -----------------------------       PCT 0.25 - 0.50 ng/mL            PCT 0.10 - 0.25 ng/mL               OR       >80% decrease in PCT            Discourage initiation of                                            antibiotics      Encourage discontinuation           of antibiotics    ----------------------------     -----------------------------         PCT >= 0.50 ng/mL              PCT 0.26 - 0.50 ng/mL               AND        <80% decrease in PCT             Encourage initiation of                                             antibiotics       Encourage continuation  of  antibiotics    ----------------------------     -----------------------------        PCT >= 0.50 ng/mL                  PCT > 0.50 ng/mL               AND         increase in PCT                  Strongly encourage                                      initiation of antibiotics    Strongly encourage escalation           of antibiotics                                     -----------------------------                                           PCT <= 0.25 ng/mL                                                 OR                                        > 80% decrease in PCT                                     Discontinue / Do not initiate                                             antibiotics Performed at [facility], [address] [place], [place] [address]   C-reactive protein     Status: Abnormal   Collection Time: 08/31/19  3:12 PM  Result Value Ref Range   CRP 8.3 (H) <1.0 mg/dL    Comment: Performed at [facility], [place] [address], [place], [place] [address]  Ferritin (Iron Binding Protein)     Status: None   Collection Time: 08/31/19  3:12 PM  Result Value Ref Range   Ferritin 46 11 - 307 ng/mL    Comment: Performed at [facility], [address] [place],  [address]  POC SARS Coronavirus 2 Ag     Status: None   Collection Time: 08/31/19  4:04 PM  Result Value Ref Range   SARS Coronavirus 2 Ag NEGATIVE NEGATIVE    Comment: (NOTE) SARS-CoV-2 antigen NOT DETECTED.  Negative results are presumptive.  Negative results do not preclude SARS-CoV-2 infection and should not be used as the sole basis for treatment or other patient management decisions, including infection  control decisions, particularly in the presence of clinical signs and  symptoms consistent with COVID-19, or in those who have been in contact with the virus.  Negative results must be combined with clinical observations, patient history, and epidemiological information. The  expected result is Negative. Fact Sheet for Patients: PodPark.tn Fact Sheet for Healthcare Providers: GiftContent.is This test is not yet approved or cleared by the [place] FDA and  has been authorized for detection and/or diagnosis of SARS-CoV-2 by FDA under an Emergency Use Authorization (EUA).  This EUA will remain in effect (meaning this test can be used) for the duration of  the COVID-19 de claration under Section 564(b)(1) of the Act, 21 U.S.C. section 360bbb-3(b)(1), unless the authorization is terminated or revoked sooner.   Urinalysis, Complete w Microscopic     Status: Abnormal   Collection Time: 08/31/19  4:35 PM  Result Value Ref Range   Color, Urine YELLOW (A) YELLOW   APPearance HAZY (A) CLEAR   Specific Gravity, Urine 1.014 1.005 - 1.030   pH 6.0 5.0 - 8.0   Glucose, UA NEGATIVE NEGATIVE mg/dL   Hgb urine dipstick NEGATIVE NEGATIVE   Bilirubin Urine NEGATIVE NEGATIVE   Ketones, ur NEGATIVE NEGATIVE mg/dL   Protein, ur 30 (A) NEGATIVE mg/dL   Nitrite NEGATIVE NEGATIVE   Leukocytes,Ua NEGATIVE NEGATIVE   RBC / HPF 0-5 0 - 5 RBC/hpf   WBC, UA 6-10 0 - 5 WBC/hpf   Bacteria, UA NONE SEEN NONE SEEN   Squamous Epithelial / LPF 0-5 0 - 5    Comment: Performed at [facility], [address], [place], [place] [address]  Respiratory Panel by RT PCR (Flu A&B, Covid) - Nasopharyngeal Swab     Status: None   Collection Time: 08/31/19  4:36 PM   Specimen: Nasopharyngeal Swab  Result Value Ref Range   SARS Coronavirus 2 by RT PCR NEGATIVE NEGATIVE    Comment: (NOTE) SARS-CoV-2 target nucleic acids are NOT DETECTED. The SARS-CoV-2 RNA is generally detectable in upper respiratoy specimens during the acute phase of infection. The lowest concentration of SARS-CoV-2 viral copies this assay can detect is 131 copies/mL. A negative result does not preclude SARS-Cov-2 infection and should not be used as the  sole basis for treatment or other patient management decisions. A negative result may occur with  improper specimen collection/handling, submission of specimen other than nasopharyngeal swab, presence of viral mutation(s) within the areas targeted by this assay, and inadequate number of viral copies (<131 copies/mL). A negative result must be combined with clinical observations, patient history, and epidemiological information. The expected result is Negative. Fact Sheet for Patients:  PinkCheek.be Fact Sheet for Healthcare Providers:  GravelBags.it This test is not yet ap proved or cleared by the [place] FDA and  has been authorized for detection and/or diagnosis of SARS-CoV-2 by FDA under an Emergency Use Authorization (EUA). This EUA will remain  in effect (meaning this test can be used) for the duration of the COVID-19 declaration under Section 564(b)(1) of the Act, 21 U.S.C. section 360bbb-3(b)(1), unless the authorization is terminated or revoked sooner.    Influenza A by PCR NEGATIVE NEGATIVE   Influenza B by PCR NEGATIVE NEGATIVE    Comment: (NOTE) The Xpert Xpress SARS-CoV-2/FLU/RSV assay is intended as an aid in  the diagnosis of influenza from Nasopharyngeal swab specimens and  should not be used as a sole basis for treatment. Nasal washings and  aspirates are unacceptable for Xpert Xpress SARS-CoV-2/FLU/RSV  testing. Fact Sheet for Patients: PinkCheek.be Fact Sheet for Healthcare Providers: GravelBags.it  This test is not yet approved or cleared by the [place] and  has been authorized for detection and/or diagnosis of SARS-CoV-2 by  FDA under an Emergency Use Authorization (EUA). This EUA will remain  in effect (meaning this test can be used) for the duration of the  Covid-19 declaration under Section 564(b)(1) of the Act, 21  U.S.C. section  360bbb-3(b)(1), unless the authorization is  terminated or revoked. Performed at [facility], [place], [place] [address]   Troponin I (High Sensitivity)     Status: Abnormal   Collection Time: 08/31/19  5:23 PM  Result Value Ref Range   Troponin I (High Sensitivity) 31 (H) <18 ng/L    Comment: (NOTE) Elevated high sensitivity troponin I (hsTnI) values and significant  changes across serial measurements may suggest ACS but many other  chronic and acute conditions are known to elevate hsTnI results.  Refer to the "Links" section for chest pain algorithms and additional  guidance. Performed at [facility], [place], [place] [address]   SARS CORONAVIRUS 2 (TAT 6-24 HRS) Nasopharyngeal Nasopharyngeal Swab     Status: None   Collection Time: 08/31/19  6:52 PM   Specimen: Nasopharyngeal Swab  Result Value Ref Range   SARS Coronavirus 2 NEGATIVE NEGATIVE    Comment: (NOTE) SARS-CoV-2 target nucleic acids are NOT DETECTED. The SARS-CoV-2 RNA is generally detectable in upper and lower respiratory specimens during the acute phase of infection. Negative results do not preclude SARS-CoV-2 infection, do not rule out co-infections with other pathogens, and should not be used as the sole basis for treatment or other patient management decisions. Negative results must be combined with clinical observations, patient history, and epidemiological information. The expected result is Negative. Fact Sheet for Patients: SugarRoll.be Fact Sheet for Healthcare Providers: https://www.woods-mathews.com/ This test is not yet approved or cleared by the [place] FDA and  has been authorized for detection and/or diagnosis of SARS-CoV-2 by FDA under an Emergency Use Authorization (EUA). This EUA will remain  in effect (meaning this test can be used) for the duration of the COVID-19 declaration under Section 56  4(b)(1) of the Act, 21 U.S.C. section 360bbb-3(b)(1), unless the authorization is terminated or revoked sooner. Performed at [facility], [place] [address], [place], [place] [id number]   Basic metabolic panel     Status: Abnormal   Collection Time: 09/01/19  4:39 AM  Result Value Ref Range   Sodium 137 135 - 145 mmol/L   Potassium 5.5 (H) 3.5 - 5.1 mmol/L   Chloride 99 98 - 111 mmol/L   CO2 33 (H) 22 - 32 mmol/L   Glucose, Bld 233 (H) 70 - 99 mg/dL   BUN 22 8 - 23 mg/dL   Creatinine, Ser 1.40 (H) 0.44 - 1.00 mg/dL   Calcium 8.7 (L) 8.9 - 10.3 mg/dL   GFR calc non Af Amer 35 (L) >60 mL/min   GFR calc Af Amer 41 (L) >60 mL/min   Anion gap 5 5 - 15    Comment: Performed at [facility], [address] [place], [place] [address]  CBC     Status: Abnormal   Collection Time: 09/01/19  4:39 AM  Result Value Ref Range   WBC 3.2 (L) 4.0 - 10.5 K/uL   RBC 3.39 (L) 3.87 - 5.11 MIL/uL   Hemoglobin 10.3 (L) 12.0 - 15.0 g/dL   HCT 32.8 (L) 36.0 - 46.0 %   MCV  96.8 80.0 - 100.0 fL   MCH 30.4 26.0 - 34.0 pg   MCHC 31.4 30.0 - 36.0 g/dL   RDW 14.3 11.5 - 15.5 %   Platelets 115 (L) 150 - 400 K/uL    Comment: Immature Platelet Fraction may be clinically indicated, consider ordering this additional test [id number]    nRBC 0.0 0.0 - 0.2 %    Comment: Performed at [facility], [address] [place], [place] [address]  Potassium     Status: None   Collection Time: 09/01/19  4:22 PM  Result Value Ref Range   Potassium 4.8 3.5 - 5.1 mmol/L    Comment: Performed at [facility], [place],  [address]   CT Angio Chest PE W/Cm &/Or Wo Cm  Result Date: 08/31/2019 CLINICAL DATA:  Shortness of breath with COVID-19 exposure EXAM: CT ANGIOGRAPHY CHEST WITH CONTRAST TECHNIQUE: Multidetector CT imaging of the chest was performed using the standard protocol during bolus administration of intravenous contrast. Multiplanar CT image reconstructions  and MIPs were obtained to evaluate the vascular anatomy. CONTRAST:  59mL OMNIPAQUE IOHEXOL 350 MG/ML SOLN COMPARISON:  Chest x-ray from earlier in the same day. FINDINGS: Cardiovascular: Thoracic aorta demonstrates atherosclerotic calcifications without aneurysmal dilatation or dissection. Coronary calcifications are seen. No significant cardiac enlargement is noted. The pulmonary artery shows a normal branching pattern. No intraluminal filling defect to suggest pulmonary embolism is identified. Mediastinum/Nodes: Thoracic inlet is within normal limits. No sizable hilar or mediastinal adenopathy is noted. Scattered small nodes are the subcarinal region as well as the AP window. The esophagus as visualized appears within normal limits. Lungs/Pleura: Diffuse emphysematous changes are noted. Patchy ground-glass infiltrates are noted. Given the patient's clinical history this is highly suggestive of COVID-19 pneumonia. Stable left upper lobe nodule is noted best seen on image number 15 of series 6 unchanged from 2017. A nodule is also seen in the lateral aspect of the left upper lobe best seen on image number 27 of series 6 also stable in appearance from the prior exam. Some areas of bronchial mucous plugging are noted. Stable calcifications are noted in the right upper lobe. No sizable effusion is seen. Upper Abdomen: Visualized upper abdomen shows no acute abnormality. Musculoskeletal: Degenerative changes of the thoracic spine are noted. No acute bony abnormality is seen. Review of the MIP images confirms the above findings. IMPRESSION: Patchy ground-glass infiltrates within the lungs bilaterally. Given the patient's recent exposure to COVID-19 this is highly suggestive of viral pneumonia. Scattered mediastinal lymph nodes are noted likely of a reactive nature. Stable scattered small nodules when compared with 2017 consistent with a benign etiology. No further follow-up is recommended. No evidence of pulmonary  emboli. Aortic Atherosclerosis (ICD10-I70.0) and Emphysema (ICD10-J43.9). Electronically Signed   By: Inez Catalina M.D.   On: 08/31/2019 19:04   DG Chest Portable 1 View  Result Date: 08/31/2019 CLINICAL DATA:  Difficulty breathing today. Patient's husband is COVID-19 positive. EXAM: PORTABLE CHEST 1 VIEW COMPARISON:  April 27, 2019 FINDINGS: The mediastinal contour is normal. The heart size is upper limits of normal. Minimal increased pulmonary interstitium with minimal hazy ground-glass opacity is identified in the bilateral lungs. There is no pleural effusion. The bony structures are stable. IMPRESSION: Minimal increased pulmonary interstitium with minimal hazy ground-glass opacity is identified in the bilateral lungs. This can be seen in early pneumonia. Electronically Signed   By: Abelardo Diesel M.D.   On: 08/31/2019 15:43    Pending Labs Unresulted  Labs (From admission, onward)    Start     Ordered   09/07/19 0500  Creatinine, serum  (enoxaparin (LOVENOX)    CrCl >/= 30 ml/min)  Weekly,   STAT    Comments: while on enoxaparin therapy    08/31/19 1843   08/31/19 1840  CBC  (enoxaparin (LOVENOX)    CrCl >/= 30 ml/min)  Once,   STAT    Comments: Baseline for enoxaparin therapy IF NOT ALREADY DRAWN.  Notify MD if PLT < 100 K.    08/31/19 1843          Vitals/Pain Today's Vitals   09/01/19 1000 09/01/19 1200 09/01/19 1500 09/01/19 2003  BP: 98/86 103/68 (!) 108/51 (!) 108/53  Pulse: 77 81 86 90  Resp: 17 18 18 20   Temp:      TempSrc:      SpO2: 100% 100% 100% 100%  Weight:      Height:      PainSc:    0-No pain    Isolation Precautions Airborne and Contact precautions  Medications Medications  lidocaine (LIDODERM) 5 % 1 patch (1 patch Transdermal Patch Applied 09/01/19 1622)  sodium chloride flush (NS) 0.9 % injection 3 mL (3 mLs Intravenous Not Given 09/01/19 1004)  acetaminophen (TYLENOL) tablet 650 mg (has no administration in time range)    Or  acetaminophen  (TYLENOL) suppository 650 mg (has no administration in time range)  polyethylene glycol (MIRALAX / GLYCOLAX) packet 17 g (has no administration in time range)  ondansetron (ZOFRAN) tablet 4 mg (has no administration in time range)    Or  ondansetron (ZOFRAN) injection 4 mg (has no administration in time range)  vitamin C (ASCORBIC ACID) tablet 500 mg (500 mg Oral Given 09/01/19 1040)  aspirin EC tablet 81 mg (81 mg Oral Given 09/01/19 1001)  busPIRone (BUSPAR) tablet 7.5 mg (7.5 mg Oral Given 09/01/19 1001)  calcium-vitamin D (OSCAL WITH D) 500-200 MG-UNIT per tablet 2 tablet (2 tablets Oral Given 09/01/19 1041)  clopidogrel (PLAVIX) tablet 75 mg (75 mg Oral Given 09/01/19 1001)  Ipratropium-Albuterol (COMBIVENT) respimat 1 puff (has no administration in time range)  fluticasone (FLONASE) 50 MCG/ACT nasal spray 2 spray (2 sprays Each Nare Given 09/01/19 1038)  mometasone-formoterol (DULERA) 200-5 MCG/ACT inhaler 2 puff (2 puffs Inhalation Given 09/01/19 0814)  iron polysaccharides (NIFEREX) capsule 150 mg (150 mg Oral Refused 09/01/19 1043)  metoprolol succinate (TOPROL-XL) 24 hr tablet 25 mg (25 mg Oral Not Given 09/01/19 1043)  nitroGLYCERIN (NITROSTAT) SL tablet 0.4 mg (has no administration in time range)  pantoprazole (PROTONIX) EC tablet 40 mg (40 mg Oral Given 09/01/19 1001)  vitamin B-12 (CYANOCOBALAMIN) tablet 3,000 mcg (3,000 mcg Oral Given 09/01/19 1041)  0.9 %  sodium chloride infusion ( Intravenous New Bag/Given 08/31/19 2219)  dexamethasone (DECADRON) injection 6 mg (6 mg Intravenous Given 09/01/19 1008)  remdesivir 200 mg in sodium chloride 0.9% 250 mL IVPB (0 mg Intravenous Stopped 09/01/19 1245)    Followed by  remdesivir 100 mg in sodium chloride 0.9 % 100 mL IVPB (has no administration in time range)  enoxaparin (LOVENOX) injection 30 mg (30 mg Subcutaneous Given 09/01/19 1010)  methylPREDNISolone sodium succinate (SOLU-MEDROL) 125 mg/2 mL injection 125 mg (125 mg  Intravenous Given 08/31/19 1531)  albuterol (VENTOLIN HFA) 108 (90 Base) MCG/ACT inhaler 4 puff (4 puffs Inhalation Given 08/31/19 1750)  acetaminophen (TYLENOL) tablet 1,000 mg (1,000 mg Oral Given 08/31/19 1622)  cefTRIAXone (ROCEPHIN) 1 g in sodium chloride 0.9 %  100 mL IVPB (0 g Intravenous Stopped 08/31/19 1900)  azithromycin (ZITHROMAX) 500 mg in sodium chloride 0.9 % 250 mL IVPB (0 mg Intravenous Stopped 08/31/19 1938)  iohexol (OMNIPAQUE) 350 MG/ML injection 60 mL (60 mLs Intravenous Contrast Given 08/31/19 1822)    Mobility walks with device Low fall risk   Focused Assessments Cardiac Assessment Handoff:  Cardiac Rhythm: Normal sinus rhythm Lab Results  Component Value Date   TROPONINI <0.03 11/22/2017   No results found for: DDIMER Does the Patient currently have chest pain? No     R Recommendations: See Admitting Provider Note  Report given to:  Erin RN on 2A  Additional Notes:

## 2019-09-01 NOTE — ED Notes (Signed)
Pt given meal tray and ice water.

## 2019-09-01 NOTE — ED Notes (Signed)
Pt had BM. Pt changed and peri care provided

## 2019-09-01 NOTE — Consult Note (Addendum)
Remdesivir - Pharmacy Brief Note   O:  ALT: 11 CTA:  groundglass opacities SpO2: 100% on [place] @ 6L  COVID Negative x2 (Ag and PCR).   D/w with Dr. Kurtis Bushman and pt will be started on treatment d/t CTA concerning for high suspicion of COVID.   Third COVID test pending. Should this comes out to be a negative result, d/w MD to consider extended respiratory viral panel.    A/P:  Remdesivir 200 mg IVPB once followed by 100 mg IVPB daily x 4 days.   Kristeen Miss, PharmD Clinical Pharmacist 09/01/2019 10:28 AM

## 2019-09-01 NOTE — Progress Notes (Signed)
PROGRESS NOTE    Catherine Landry  [id number] DOB: 1935/11/26 DOA: 08/31/2019 PCP: Tracie Harrier, MD    Brief Narrative:  Catherine Landry is a 83 y.o. female with medical history significant of Catherine Bjornsen Catherine Landry a 83 y.o.femalewith past medical history of COPD on 2 L nasal cannula, CHF, GERD, renal cancer status post left nephrectomy who presents to the ED complaining of shortness of breath.  Per patient she is experiencing worsening shortness of breath spite using her home oxygen.  Her husband recently tested positive for COVID-19 and had similar symptoms.   On arrival to ED patient was mildly tachycardic, tachypneic and saturating in 60s with 2 L.  She was placed on nonrebreather which improved her saturation.  Later transitioned to high flow nasal cannula.  Initial POC COVID-19 testing was negative but chest x-ray shows bilateral infiltrate consistent with COVID-19 pneumonia.  Respiratory viral panel negative. D-dimer was elevated at 1726.  Ferritin within normal limit, BNP mildly elevated at 273.  Troponin positive at 29.    Consultants:   None  Procedures CT chest Patchy ground-glass infiltrates within the lungs bilaterally. Given the patient's recent exposure to COVID-19 this is highly suggestive of viral pneumonia. Scattered mediastinal lymph nodes are noted likely of a reactive nature. Stable scattered small nodules when compared with 2017 consistent with a benign etiology. No further follow-up is recommended. No evidence of pulmonary emboli. Aortic Atherosclerosis (ICD10-I70.0) and Emphysema (ICD10-J43.9).  Antimicrobials:   Remdesivir, ceftrixaone, azithromycin   Subjective: She is feeling much better than when she came in yesterday.  Her shortness of breath is better but not at baseline.  No chest pain.  Or any other symptoms.  Objective: Vitals:   09/01/19 0900 09/01/19 1000 09/01/19 1200 09/01/19 1500  BP: 102/72 98/86 103/68 (!) 108/51    Pulse: 79 77 81 86  Resp: 20 17 18 18   Temp:      TempSrc:      SpO2: 100% 100% 100% 100%  Weight:      Height:       No intake or output data in the 24 hours ending 09/01/19 1526 Filed Weights   08/31/19 1507  Weight: 54.9 kg    Examination:  General exam: Appears calm and comfortable NAD Respiratory system: Clear to auscultation.  Increased expiratory time, no wheezing Cardiovascular system: S1 & S2 heard, RRR. No JVD, murmurs, rubs, gallops or clicks.  Gastrointestinal system: Abdomen is nondistended, soft and nontender. Normal bowel sounds heard. Central nervous system: Alert and oriented. No focal neurological deficits. Extremities: No edema Skin: Warm dry Psychiatry: Judgement and insight appear normal. Mood & affect appropriate.     Data Reviewed: I have personally reviewed following labs and imaging studies  CBC: Recent Labs  Lab 08/31/19 1512 09/01/19 0439  WBC 7.4 3.2*  NEUTROABS 4.6  --   HGB 11.0* 10.3*  HCT 33.3* 32.8*  MCV 92.2 96.8  PLT 158 [id number]*   Basic Metabolic Panel: Recent Labs  Lab 08/31/19 1512 09/01/19 0439  NA 137 137  K 4.7 5.5*  CL 94* 99  CO2 34* 33*  GLUCOSE 109* 233*  BUN 15 22  CREATININE 1.18* 1.40*  CALCIUM 9.4 8.7*   GFR: Estimated Creatinine Clearance: 24.9 mL/min (A) (by C-G formula based on SCr of 1.4 mg/dL (H)). Liver Function Tests: Recent Labs  Lab 08/31/19 1512  AST 22  ALT 11  ALKPHOS 50  BILITOT 0.7  PROT 7.9  ALBUMIN 4.0   No results for  input(s): LIPASE, AMYLASE in the last 168 hours. No results for input(s): AMMONIA in the last 168 hours. Coagulation Profile: No results for input(s): INR, PROTIME in the last 168 hours. Cardiac Enzymes: No results for input(s): CKTOTAL, CKMB, CKMBINDEX, TROPONINI in the last 168 hours. BNP (last 3 results) No results for input(s): PROBNP in the last 8760 hours. HbA1C: No results for input(s): HGBA1C in the last 72 hours. CBG: No results for input(s): GLUCAP in  the last 168 hours. Lipid Profile: No results for input(s): CHOL, HDL, LDLCALC, TRIG, CHOLHDL, LDLDIRECT in the last 72 hours. Thyroid Function Tests: No results for input(s): TSH, T4TOTAL, FREET4, T3FREE, THYROIDAB in the last 72 hours. Anemia Panel: Recent Labs    08/31/19 1512  FERRITIN 46   Sepsis Labs: Recent Labs  Lab 08/31/19 1512  PROCALCITON <0.10    Recent Results (from the past 240 hour(s))  Respiratory Panel by RT PCR (Flu A&B, Covid) - Nasopharyngeal Swab     Status: None   Collection Time: 08/31/19  4:36 PM   Specimen: Nasopharyngeal Swab  Result Value Ref Range Status   SARS Coronavirus 2 by RT PCR NEGATIVE NEGATIVE Final    Comment: (NOTE) SARS-CoV-2 target nucleic acids are NOT DETECTED. The SARS-CoV-2 RNA is generally detectable in upper respiratoy specimens during the acute phase of infection. The lowest concentration of SARS-CoV-2 viral copies this assay can detect is 131 copies/mL. A negative result does not preclude SARS-Cov-2 infection and should not be used as the sole basis for treatment or other patient management decisions. A negative result may occur with  improper specimen collection/handling, submission of specimen other than nasopharyngeal swab, presence of viral mutation(s) within the areas targeted by this assay, and inadequate number of viral copies (<131 copies/mL). A negative result must be combined with clinical observations, patient history, and epidemiological information. The expected result is Negative. Fact Sheet for Patients:  PinkCheek.be Fact Sheet for Healthcare Providers:  GravelBags.it This test is not yet ap proved or cleared by the [place] FDA and  has been authorized for detection and/or diagnosis of SARS-CoV-2 by FDA under an Emergency Use Authorization (EUA). This EUA will remain  in effect (meaning this test can be used) for the duration of the COVID-19  declaration under Section 564(b)(1) of the Act, 21 U.S.C. section 360bbb-3(b)(1), unless the authorization is terminated or revoked sooner.    Influenza A by PCR NEGATIVE NEGATIVE Final   Influenza B by PCR NEGATIVE NEGATIVE Final    Comment: (NOTE) The Xpert Xpress SARS-CoV-2/FLU/RSV assay is intended as an aid in  the diagnosis of influenza from Nasopharyngeal swab specimens and  should not be used as a sole basis for treatment. Nasal washings and  aspirates are unacceptable for Xpert Xpress SARS-CoV-2/FLU/RSV  testing. Fact Sheet for Patients: PinkCheek.be Fact Sheet for Healthcare Providers: GravelBags.it This test is not yet approved or cleared by the [place] FDA and  has been authorized for detection and/or diagnosis of SARS-CoV-2 by  FDA under an Emergency Use Authorization (EUA). This EUA will remain  in effect (meaning this test can be used) for the duration of the  Covid-19 declaration under Section 564(b)(1) of the Act, 21  U.S.C. section 360bbb-3(b)(1), unless the authorization is  terminated or revoked. Performed at [facility], [place], [place] [address]   SARS CORONAVIRUS 2 (TAT 6-24 HRS) Nasopharyngeal Nasopharyngeal Swab     Status: None   Collection Time: 08/31/19  6:52 PM   Specimen: Nasopharyngeal  Swab  Result Value Ref Range Status   SARS Coronavirus 2 NEGATIVE NEGATIVE Final    Comment: (NOTE) SARS-CoV-2 target nucleic acids are NOT DETECTED. The SARS-CoV-2 RNA is generally detectable in upper and lower respiratory specimens during the acute phase of infection. Negative results do not preclude SARS-CoV-2 infection, do not rule out co-infections with other pathogens, and should not be used as the sole basis for treatment or other patient management decisions. Negative results must be combined with clinical observations, patient history, and epidemiological information.  The expected result is Negative. Fact Sheet for Patients: SugarRoll.be Fact Sheet for Healthcare Providers: https://www.woods-mathews.com/ This test is not yet approved or cleared by the [place] FDA and  has been authorized for detection and/or diagnosis of SARS-CoV-2 by FDA under an Emergency Use Authorization (EUA). This EUA will remain  in effect (meaning this test can be used) for the duration of the COVID-19 declaration under Section 56 4(b)(1) of the Act, 21 U.S.C. section 360bbb-3(b)(1), unless the authorization is terminated or revoked sooner. Performed at [facility], [place] [address], [place],  [address]          Radiology Studies: CT Angio Chest PE W/Cm &/Or Wo Cm  Result Date: 08/31/2019 CLINICAL DATA:  Shortness of breath with COVID-19 exposure EXAM: CT ANGIOGRAPHY CHEST WITH CONTRAST TECHNIQUE: Multidetector CT imaging of the chest was performed using the standard protocol during bolus administration of intravenous contrast. Multiplanar CT image reconstructions and MIPs were obtained to evaluate the vascular anatomy. CONTRAST:  98mL OMNIPAQUE IOHEXOL 350 MG/ML SOLN COMPARISON:  Chest x-ray from earlier in the same day. FINDINGS: Cardiovascular: Thoracic aorta demonstrates atherosclerotic calcifications without aneurysmal dilatation or dissection. Coronary calcifications are seen. No significant cardiac enlargement is noted. The pulmonary artery shows a normal branching pattern. No intraluminal filling defect to suggest pulmonary embolism is identified. Mediastinum/Nodes: Thoracic inlet is within normal limits. No sizable hilar or mediastinal adenopathy is noted. Scattered small nodes are the subcarinal region as well as the AP window. The esophagus as visualized appears within normal limits. Lungs/Pleura: Diffuse emphysematous changes are noted. Patchy ground-glass infiltrates are noted. Given the patient's clinical  history this is highly suggestive of COVID-19 pneumonia. Stable left upper lobe nodule is noted best seen on image number 15 of series 6 unchanged from 2017. A nodule is also seen in the lateral aspect of the left upper lobe best seen on image number 27 of series 6 also stable in appearance from the prior exam. Some areas of bronchial mucous plugging are noted. Stable calcifications are noted in the right upper lobe. No sizable effusion is seen. Upper Abdomen: Visualized upper abdomen shows no acute abnormality. Musculoskeletal: Degenerative changes of the thoracic spine are noted. No acute bony abnormality is seen. Review of the MIP images confirms the above findings. IMPRESSION: Patchy ground-glass infiltrates within the lungs bilaterally. Given the patient's recent exposure to COVID-19 this is highly suggestive of viral pneumonia. Scattered mediastinal lymph nodes are noted likely of a reactive nature. Stable scattered small nodules when compared with 2017 consistent with a benign etiology. No further follow-up is recommended. No evidence of pulmonary emboli. Aortic Atherosclerosis (ICD10-I70.0) and Emphysema (ICD10-J43.9). Electronically Signed   By: Inez Catalina M.D.   On: 08/31/2019 19:04   DG Chest Portable 1 View  Result Date: 08/31/2019 CLINICAL DATA:  Difficulty breathing today. Patient's husband is COVID-19 positive. EXAM: PORTABLE CHEST 1 VIEW COMPARISON:  April 27, 2019 FINDINGS: The mediastinal contour is normal. The heart size  is upper limits of normal. Minimal increased pulmonary interstitium with minimal hazy ground-glass opacity is identified in the bilateral lungs. There is no pleural effusion. The bony structures are stable. IMPRESSION: Minimal increased pulmonary interstitium with minimal hazy ground-glass opacity is identified in the bilateral lungs. This can be seen in early pneumonia. Electronically Signed   By: Abelardo Diesel M.D.   On: 08/31/2019 15:43        Scheduled Meds: .  aspirin EC  81 mg Oral q morning - 10a  . busPIRone  7.5 mg Oral BID  . calcium-vitamin D  2 tablet Oral Daily  . clopidogrel  75 mg Oral Daily  . dexamethasone (DECADRON) injection  6 mg Intravenous Q24H  . enoxaparin (LOVENOX) injection  30 mg Subcutaneous Q24H  . fluticasone  2 spray Each Nare Daily  . iron polysaccharides  150 mg Oral BID  . lidocaine  1 patch Transdermal Q24H  . losartan  50 mg Oral q morning - 10a  . metoprolol succinate  25 mg Oral q morning - 10a  . mometasone-formoterol  2 puff Inhalation BID  . pantoprazole  40 mg Oral BID  . sodium chloride flush  3 mL Intravenous Q12H  . theophylline  300 mg Oral Daily  . vitamin B-12  3,000 mcg Oral Daily  . ascorbic acid  500 mg Oral Daily   Continuous Infusions: . [START ON 09/02/2019] remdesivir 100 mg in NS 100 mL      Assessment & Plan:   Active Problems:   Respiratory failure with hypoxia (HCC)  Acute hypoxic respiratory failure./COPD exacerbation/CAP Although initial COVID-19 test is negative.  Patient is high risk due to sick contact as her husband recently diagnosed with COVID-19 pneumonia.   Chest x-ray with bilateral infiltrate consistent with COVID-19 pneumonia.  Patient is high risk because of underlying COPD. CTA no PE, b/l ground glass infilatrate highly suggestive of viral pna. -Clinically improving  Third Covid test pending, still on airborne precaution and started on  Remdesvir for now. Switched Solu-Medrol to Decadron 2 g IV daily -Continue home inhalers. -Ceftriaxone and azithromycin for possible CAP. CRP elevated Procalcitonin <0.10 D/c theophyline as pharmacy stated pt not taking at home.   CAD with HFrEF.   Recent right and left cardiac catheterization.  Patient fraction of 45 to 50%, and 100% stenosis of proximal circumflex, 99% stenosis of RCA.  She was high risk for PCI and was advised to continue with dual antiplatelets. Mildly elevated troponin-most likely demand. Euvolemic on  exam Continue on Plavix, aspirin, and Toprol Hold losartan due to elevated K   AKI.  Mildly elevated  On ivf since had cta.  -Avoid nephrotoxic.- hold losartan for now -BMP tomorrow  DVT prophylaxis: Lovenox Code Status: Full code Family Communication: No family at bedside.    All questions and concerns discussed with patient Disposition Plan: likely be here 2 [place] stays until medically more stable.       LOS: 1 day   Time spent: 45 minutes with more than 50% on [place], MD [facility] Pager [phone]  If 7PM-7AM, please contact night-coverage [email or web address] Password [facility] 09/01/2019, 3:26 PM

## 2019-09-02 LAB — GLUCOSE, CAPILLARY
Glucose-Capillary: 117 mg/dL — ABNORMAL HIGH (ref 70–99)
Glucose-Capillary: 145 mg/dL — ABNORMAL HIGH (ref 70–99)

## 2019-09-02 MED ORDER — ORAL CARE MOUTH RINSE
15.0000 mL | Freq: Two times a day (BID) | OROMUCOSAL | Status: DC
  Administered 2019-09-02 – 2019-09-12 (×19): 15 mL via OROMUCOSAL

## 2019-09-02 MED ORDER — METOPROLOL SUCCINATE ER 25 MG PO TB24
12.5000 mg | ORAL_TABLET | Freq: Every morning | ORAL | Status: DC
  Administered 2019-09-03 – 2019-09-12 (×10): 12.5 mg via ORAL
  Filled 2019-09-02 (×10): qty 1

## 2019-09-02 NOTE — Progress Notes (Signed)
PROGRESS NOTE    Catherine Landry  [id number] DOB: 01-May-1936 DOA: 08/31/2019 PCP: Tracie Harrier, MD    Brief Narrative:  Catherine Exley Masseyis a 83 y.o.femalewith medical history significant ofEleanor Elilah Landry a 82 y.o.femalewith past medical history of COPD on 2 L nasal cannula, CHF, GERD, renal cancer status post left nephrectomy who presents to the ED complaining of shortness of breath. Per patient she is experiencing worsening shortness of breath spite using her home oxygen. Her husband recently tested positive for COVID-19 and had similar symptoms. On arrival to ED patient was mildly tachycardic, tachypneic and saturating in 60s with 2 L. She was placed on nonrebreather which improved her saturation. Later transitioned to high flow nasal cannula. Initial POC COVID-19 testing was negative but chest x-ray shows bilateral infiltrate consistent with COVID-19 pneumonia. Respiratory viral panel negative.D-dimer was elevated at 1726.Ferritin within normal limit,BNP mildly elevated at 273.Troponin positive at 29.  She is presumably being treated for Covid 19 pneumonia    Consultants:   None  Procedures CT chest Patchy ground-glass infiltrates within the lungs bilaterally. Given the patient's recent exposure to COVID-19 this is highly suggestive of viral pneumonia. Scattered mediastinal lymph nodes are noted likely of a reactive nature. Stable scattered small nodules when compared with 2017 consistent with a benign etiology. No further follow-up is recommended. No evidence of pulmonary emboli. Aortic Atherosclerosis (ICD10-I70.0) and Emphysema (ICD10-J43.9).  Antimicrobials:   Remdesivir, ceftrixaone, azithromycin   Subjective: Patient reports with oxygen she breathes better.  She has been requiring 6 L and we have weaned it down to 5 so far.  Baseline is 2 L.  Denies chest pain, dizziness, loss of appetite or taste, fever or  chills Objective: Vitals:   09/01/19 1500 09/01/19 2003 09/02/19 0549 09/02/19 0943  BP: (!) 108/51 (!) 108/53 (!) 119/56 (!) 109/57  Pulse: 86 90 91 89  Resp: 18 20 18 20   Temp:   98.3 F (36.8 C) 98.4 F (36.9 C)  TempSrc:   Oral Oral  SpO2: 100% 100% 95% 97%  Weight:      Height:        Intake/Output Summary (Last 24 hours) at 09/02/2019 1414 Last data filed at 09/02/2019 0500 Gross per 24 hour  Intake --  Output 125 ml  Net -125 ml   Filed Weights   08/31/19 1507  Weight: 54.9 kg    Examination:  General exam: Appears calm and comfortable nontachypneic Respiratory system: mild rhonchi bilaterally, Respiratory effort normal.  No wheezing Cardiovascular system: S1 & S2 heard, RRR. No murmurs, rubs, gallops or clicks.  Gastrointestinal system: Abdomen is nondistended, soft and nontender. Normal bowel sounds heard.  No rebound or guarding Central nervous system: Alert and oriented. No focal neurological deficits. Extremitie no edema Psychiatry: Judgement and insight appear normal. Mood & affect appropriate.     Data Reviewed: I have personally reviewed following labs and imaging studies  CBC: Recent Labs  Lab 08/31/19 1512 09/01/19 0439  WBC 7.4 3.2*  NEUTROABS 4.6  --   HGB 11.0* 10.3*  HCT 33.3* 32.8*  MCV 92.2 96.8  PLT 158 [id number]   Basic Metabolic Panel: Recent Labs  Lab 08/31/19 1512 09/01/19 0439 09/01/19 1622  NA 137 137  --   K 4.7 5.5* 4.8  CL 94* 99  --   CO2 34* 33*  --   GLUCOSE 109* 233*  --   BUN 15 22  --   CREATININE 1.18* 1.40*  --   CALCIUM 9.4  8.7*  --    GFR: Estimated Creatinine Clearance: 24.9 mL/min (A) (by C-G formula based on SCr of 1.4 mg/dL (H)). Liver Function Tests: Recent Labs  Lab 08/31/19 1512  AST 22  ALT 11  ALKPHOS 50  BILITOT 0.7  PROT 7.9  ALBUMIN 4.0   No results for input(s): LIPASE, AMYLASE in the last 168 hours. No results for input(s): AMMONIA in the last 168 hours. Coagulation Profile: No  results for input(s): INR, PROTIME in the last 168 hours. Cardiac Enzymes: No results for input(s): CKTOTAL, CKMB, CKMBINDEX, TROPONINI in the last 168 hours. BNP (last 3 results) No results for input(s): PROBNP in the last 8760 hours. HbA1C: No results for input(s): HGBA1C in the last 72 hours. CBG: Recent Labs  Lab 09/02/19 0810  GLUCAP 117*   Lipid Profile: No results for input(s): CHOL, HDL, LDLCALC, TRIG, CHOLHDL, LDLDIRECT in the last 72 hours. Thyroid Function Tests: No results for input(s): TSH, T4TOTAL, FREET4, T3FREE, THYROIDAB in the last 72 hours. Anemia Panel: Recent Labs    08/31/19 1512  FERRITIN 46   Sepsis Labs: Recent Labs  Lab 08/31/19 1512  PROCALCITON <0.10    Recent Results (from the past 240 hour(s))  Respiratory Panel by RT PCR (Flu A&B, Covid) - Nasopharyngeal Swab     Status: None   Collection Time: 08/31/19  4:36 PM   Specimen: Nasopharyngeal Swab  Result Value Ref Range Status   SARS Coronavirus 2 by RT PCR NEGATIVE NEGATIVE Final    Comment: (NOTE) SARS-CoV-2 target nucleic acids are NOT DETECTED. The SARS-CoV-2 RNA is generally detectable in upper respiratoy specimens during the acute phase of infection. The lowest concentration of SARS-CoV-2 viral copies this assay can detect is 131 copies/mL. A negative result does not preclude SARS-Cov-2 infection and should not be used as the sole basis for treatment or other patient management decisions. A negative result may occur with  improper specimen collection/handling, submission of specimen other than nasopharyngeal swab, presence of viral mutation(s) within the areas targeted by this assay, and inadequate number of viral copies (<131 copies/mL). A negative result must be combined with clinical observations, patient history, and epidemiological information. The expected result is Negative. Fact Sheet for Patients:  PinkCheek.be Fact Sheet for Healthcare  Providers:  GravelBags.it This test is not yet ap proved or cleared by the [place] FDA and  has been authorized for detection and/or diagnosis of SARS-CoV-2 by FDA under an Emergency Use Authorization (EUA). This EUA will remain  in effect (meaning this test can be used) for the duration of the COVID-19 declaration under Section 564(b)(1) of the Act, 21 U.S.C. section 360bbb-3(b)(1), unless the authorization is terminated or revoked sooner.    Influenza A by PCR NEGATIVE NEGATIVE Final   Influenza B by PCR NEGATIVE NEGATIVE Final    Comment: (NOTE) The Xpert Xpress SARS-CoV-2/FLU/RSV assay is intended as an aid in  the diagnosis of influenza from Nasopharyngeal swab specimens and  should not be used as a sole basis for treatment. Nasal washings and  aspirates are unacceptable for Xpert Xpress SARS-CoV-2/FLU/RSV  testing. Fact Sheet for Patients: PinkCheek.be Fact Sheet for Healthcare Providers: GravelBags.it This test is not yet approved or cleared by the [place] FDA and  has been authorized for detection and/or diagnosis of SARS-CoV-2 by  FDA under an Emergency Use Authorization (EUA). This EUA will remain  in effect (meaning this test can be used) for the duration of the  Covid-19 declaration under Section 564(b)(1) of  the Act, 21  U.S.C. section 360bbb-3(b)(1), unless the authorization is  terminated or revoked. Performed at [facility], [place], [place] [address]   SARS CORONAVIRUS 2 (TAT 6-24 HRS) Nasopharyngeal Nasopharyngeal Swab     Status: None   Collection Time: 08/31/19  6:52 PM   Specimen: Nasopharyngeal Swab  Result Value Ref Range Status   SARS Coronavirus 2 NEGATIVE NEGATIVE Final    Comment: (NOTE) SARS-CoV-2 target nucleic acids are NOT DETECTED. The SARS-CoV-2 RNA is generally detectable in upper and lower respiratory specimens during  the acute phase of infection. Negative results do not preclude SARS-CoV-2 infection, do not rule out co-infections with other pathogens, and should not be used as the sole basis for treatment or other patient management decisions. Negative results must be combined with clinical observations, patient history, and epidemiological information. The expected result is Negative. Fact Sheet for Patients: SugarRoll.be Fact Sheet for Healthcare Providers: https://www.woods-mathews.com/ This test is not yet approved or cleared by the [place] FDA and  has been authorized for detection and/or diagnosis of SARS-CoV-2 by FDA under an Emergency Use Authorization (EUA). This EUA will remain  in effect (meaning this test can be used) for the duration of the COVID-19 declaration under Section 56 4(b)(1) of the Act, 21 U.S.C. section 360bbb-3(b)(1), unless the authorization is terminated or revoked sooner. Performed at [facility], [place] [address] [place], [place] [address]          Radiology Studies: CT Angio Chest PE W/Cm &/Or Wo Cm  Result Date: 08/31/2019 CLINICAL DATA:  Shortness of breath with COVID-19 exposure EXAM: CT ANGIOGRAPHY CHEST WITH CONTRAST TECHNIQUE: Multidetector CT imaging of the chest was performed using the standard protocol during bolus administration of intravenous contrast. Multiplanar CT image reconstructions and MIPs were obtained to evaluate the vascular anatomy. CONTRAST:  43mL OMNIPAQUE IOHEXOL 350 MG/ML SOLN COMPARISON:  Chest x-ray from earlier in the same day. FINDINGS: Cardiovascular: Thoracic aorta demonstrates atherosclerotic calcifications without aneurysmal dilatation or dissection. Coronary calcifications are seen. No significant cardiac enlargement is noted. The pulmonary artery shows a normal branching pattern. No intraluminal filling defect to suggest pulmonary embolism is identified. Mediastinum/Nodes:  Thoracic inlet is within normal limits. No sizable hilar or mediastinal adenopathy is noted. Scattered small nodes are the subcarinal region as well as the AP window. The esophagus as visualized appears within normal limits. Lungs/Pleura: Diffuse emphysematous changes are noted. Patchy ground-glass infiltrates are noted. Given the patient's clinical history this is highly suggestive of COVID-19 pneumonia. Stable left upper lobe nodule is noted best seen on image number 15 of series 6 unchanged from 2017. A nodule is also seen in the lateral aspect of the left upper lobe best seen on image number 27 of series 6 also stable in appearance from the prior exam. Some areas of bronchial mucous plugging are noted. Stable calcifications are noted in the right upper lobe. No sizable effusion is seen. Upper Abdomen: Visualized upper abdomen shows no acute abnormality. Musculoskeletal: Degenerative changes of the thoracic spine are noted. No acute bony abnormality is seen. Review of the MIP images confirms the above findings. IMPRESSION: Patchy ground-glass infiltrates within the lungs bilaterally. Given the patient's recent exposure to COVID-19 this is highly suggestive of viral pneumonia. Scattered mediastinal lymph nodes are noted likely of a reactive nature. Stable scattered small nodules when compared with 2017 consistent with a benign etiology. No further follow-up is recommended. No evidence of pulmonary emboli. Aortic Atherosclerosis (ICD10-I70.0) and Emphysema (  ICD10-J43.9). Electronically Signed   By: Inez Catalina M.D.   On: 08/31/2019 19:04   DG Chest Portable 1 View  Result Date: 08/31/2019 CLINICAL DATA:  Difficulty breathing today. Patient's husband is COVID-19 positive. EXAM: PORTABLE CHEST 1 VIEW COMPARISON:  April 27, 2019 FINDINGS: The mediastinal contour is normal. The heart size is upper limits of normal. Minimal increased pulmonary interstitium with minimal hazy ground-glass opacity is identified in  the bilateral lungs. There is no pleural effusion. The bony structures are stable. IMPRESSION: Minimal increased pulmonary interstitium with minimal hazy ground-glass opacity is identified in the bilateral lungs. This can be seen in early pneumonia. Electronically Signed   By: Abelardo Diesel M.D.   On: 08/31/2019 15:43        Scheduled Meds: . aspirin EC  81 mg Oral q morning - 10a  . busPIRone  7.5 mg Oral BID  . calcium-vitamin D  2 tablet Oral Daily  . clopidogrel  75 mg Oral Daily  . dexamethasone (DECADRON) injection  6 mg Intravenous Q24H  . enoxaparin (LOVENOX) injection  30 mg Subcutaneous Q24H  . fluticasone  2 spray Each Nare Daily  . influenza vaccine adjuvanted  0.5 mL Intramuscular Tomorrow-1000  . iron polysaccharides  150 mg Oral BID  . lidocaine  1 patch Transdermal Q24H  . mouth rinse  15 mL Mouth Rinse BID  . metoprolol succinate  25 mg Oral q morning - 10a  . mometasone-formoterol  2 puff Inhalation BID  . pantoprazole  40 mg Oral BID  . sodium chloride flush  3 mL Intravenous Q12H  . vitamin B-12  3,000 mcg Oral Daily  . ascorbic acid  500 mg Oral Daily   Continuous Infusions: . remdesivir 100 mg in NS 100 mL 100 mg (09/02/19 0954)    Assessment & Plan:   Active Problems:   Respiratory failure with hypoxia (HCC)  Acute hypoxic respiratory failure./COPD exacerbation/CAP Although initial COVID-19 test is negative. Patient is high risk due to sick contact as her husband recently diagnosed with COVID-19 pneumonia.  Chest x-ray with bilateral infiltrate consistent with COVID-19 pneumonia. Patient is high risk because of underlying COPD. CTA no PE, b/l ground glass infilatrate highly suggestive of viral pna. -Clinically improving  Third Covid test negative, but treating for presumed covid due to high risk , still on airborne precaution and started on  Remdesvir for now. Switched Solu-Medrol to Decadron 6 g IV daily -Continue home inhalers. -Ceftriaxone and  azithromycin for possible CAP. CRP elevated Procalcitonin<0.10 D/c'd  theophyline as pharmacy stated pt not taking at home.   CAD withHFrEF.  Recent right and left cardiac catheterization.Patient fraction of 45 to 50%,and 100% stenosis of proximal circumflex, 99% stenosis of RCA.She was high risk for PCI and was advised to continue with dual antiplatelets. Mildly elevated troponin-most likely demand. Euvolemic on exam Continue on Plavix, aspirin, and Toprol Decrease toprol xl to 12.5 mg qd for softer bp Holding losartan due to elevated K   AKI.Mildly elevated  On ivf since had cta.  -Avoid nephrotoxic.- hold losartan for now -BMP tomorrow  DVT prophylaxis:Lovenox Code Status:Full code Family Communication:No family at bedside. Disposition Plan:likely be here 2 [place] stays until medically more stable.  Need to wean down on oxygen      LOS: 2 days   Time spent: 45 minutes with more than 50% on [place], MD [facility] Pager [phone]  If 7PM-7AM, please contact night-coverage [email or web address] Password [facility] 09/02/2019, 2:14  PM  

## 2019-09-03 DIAGNOSIS — D61818 Other pancytopenia: Secondary | ICD-10-CM

## 2019-09-03 DIAGNOSIS — I2510 Atherosclerotic heart disease of native coronary artery without angina pectoris: Secondary | ICD-10-CM

## 2019-09-03 LAB — GLUCOSE, CAPILLARY
Glucose-Capillary: 75 mg/dL (ref 70–99)
Glucose-Capillary: 168 mg/dL — ABNORMAL HIGH (ref 70–99)

## 2019-09-03 LAB — BASIC METABOLIC PANEL
Anion gap: 6 (ref 5–15)
BUN: 31 mg/dL — ABNORMAL HIGH (ref 8–23)
CO2: 32 mmol/L (ref 22–32)
Calcium: 8.5 mg/dL — ABNORMAL LOW (ref 8.9–10.3)
Chloride: 100 mmol/L (ref 98–111)
Creatinine, Ser: 0.99 mg/dL (ref 0.44–1.00)
GFR calc Af Amer: >60 mL/min (ref >60)
GFR calc non Af Amer: 53 mL/min — ABNORMAL LOW (ref >60)
Glucose, Bld: 124 mg/dL — ABNORMAL HIGH (ref 70–99)
Potassium: 5 mmol/L (ref 3.5–5.1)
Sodium: 138 mmol/L (ref 135–145)

## 2019-09-03 LAB — CBC
HCT: 30.4 % — ABNORMAL LOW (ref 36.0–46.0)
Hemoglobin: 9.4 g/dL — ABNORMAL LOW (ref 12.0–15.0)
MCH: 30.2 pg (ref 26.0–34.0)
MCHC: 30.9 g/dL (ref 30.0–36.0)
MCV: 97.7 fL (ref 80.0–100.0)
Platelets: 145 10*3/uL — ABNORMAL LOW (ref 150–400)
RBC: 3.11 MIL/uL — ABNORMAL LOW (ref 3.87–5.11)
RDW: 14.6 % (ref 11.5–15.5)
WBC: 6.8 10*3/uL (ref 4.0–10.5)
nRBC: 0 % (ref 0.0–0.2)

## 2019-09-03 MED ORDER — ENOXAPARIN SODIUM 40 MG/0.4ML ~~LOC~~ SOLN
40.0000 mg | SUBCUTANEOUS | Status: DC
  Administered 2019-09-04 – 2019-09-11 (×8): 40 mg via SUBCUTANEOUS
  Filled 2019-09-03 (×9): qty 0.4

## 2019-09-03 MED ORDER — SODIUM POLYSTYRENE SULFONATE 15 GM/60ML PO SUSP
15.0000 g | Freq: Once | ORAL | Status: AC
  Administered 2019-09-03: 15 g via ORAL
  Filled 2019-09-03: qty 60

## 2019-09-03 NOTE — Progress Notes (Signed)
PHARMACIST - PHYSICIAN COMMUNICATION  CONCERNING:  Enoxaparin (Lovenox) for DVT Prophylaxis    RECOMMENDATION: Patient was prescribed enoxaparin 30mg  q24 hours for VTE prophylaxis.   Filed Weights   08/31/19 1507 09/03/19 0354  Weight: 121 lb (54.9 kg) 131 lb 9.8 oz (59.7 kg)    Body mass index is 24.07 kg/m.  Estimated Creatinine Clearance: 35.2 mL/min (by C-G formula based on SCr of 0.99 mg/dL).   Based on [place] patient is candidate for enoxaparin 40mg  based on CrCl >36ml/min  DESCRIPTION: Pharmacy has adjusted enoxaparin dose per [facility] policy.    Patient is now receiving enoxaparin 40mg  every 24 hours.  Tawnya Crook, PharmD Clinical Pharmacist  09/03/2019 2:05 PM

## 2019-09-03 NOTE — Plan of Care (Signed)
  Problem: Education: Goal: Knowledge of General Education information will improve Description: Including pain rating scale, medication(s)/side effects and non-pharmacologic comfort measures Outcome: Progressing   Problem: Health Behavior/Discharge Planning: Goal: Ability to manage health-related needs will improve Outcome: Progressing   Problem: Clinical Measurements: Goal: Ability to maintain clinical measurements within normal limits will improve Outcome: Not Progressing Note: BUN is elevated at 31. Will continue to monitor renal function labs for the remainder of the shift. Wenda Low [facility]

## 2019-09-03 NOTE — Progress Notes (Signed)
PROGRESS NOTE    Catherine Landry  [id number] DOB: 02/11/1936 DOA: 08/31/2019 PCP: Tracie Harrier, MD    Brief Narrative:  Catherine Puza Masseyis a 83 y.o.femalewith medical history significant ofEleanor Vollie Landry a 83 y.o.femalewith past medical history of COPD on 2 L nasal cannula, CHF, GERD, renal cancer status post left nephrectomy who presents to the ED complaining of shortness of breath. Per patient she is experiencing worsening shortness of breath spite using her home oxygen. Her husband recently tested positive for COVID-19 and had similar symptoms. On arrival to ED patient was mildly tachycardic, tachypneic and saturating in 60s with 2 L. She was placed on nonrebreather which improved her saturation. Later transitioned to high flow nasal cannula. Initial POC COVID-19 testing was negative but chest x-ray shows bilateral infiltrate consistent with COVID-19 pneumonia. Respiratory viral panel negative.D-dimer was elevated at 1726.Ferritin within normal limit,BNP mildly elevated at 273.Troponin positive at 29.  She is presumably being treated for Covid 19 pneumonia    Consultants:   None  Procedures CT chest Patchy ground-glass infiltrates within the lungs bilaterally. Given the patient's recent exposure to COVID-19 this is highly suggestive of viral pneumonia. Scattered mediastinal lymph nodes are noted likely of a reactive nature. Stable scattered small nodules when compared with 2017 consistent with a benign etiology. No further follow-up is recommended. No evidence of pulmonary emboli. Aortic Atherosclerosis (ICD10-I70.0) and Emphysema (ICD10-J43.9).  Antimicrobials:   Remdesivir, ceftrixaone, azithromycin   Subjective: Still requiring 5-6L o2. But pt reports sob improving but not at baseline yet. No cp , fever, chills or any other complaints. Objective: Vitals:   09/03/19 0346 09/03/19 0354 09/03/19 0515 09/03/19 0838  BP: (!)  153/86  (!) 122/59 133/78  Pulse: 98  78 83  Resp: 18  18 19   Temp: 97.7 F (36.5 C)  97.9 F (36.6 C) 98.1 F (36.7 C)  TempSrc: Oral  Oral Oral  SpO2: 92% 94% 99% 99%  Weight:  59.7 kg    Height:        Intake/Output Summary (Last 24 hours) at 09/03/2019 1351 Last data filed at 09/03/2019 0121 Gross per 24 hour  Intake --  Output 3750 ml  Net -3750 ml   Filed Weights   08/31/19 1507 09/03/19 0354  Weight: 54.9 kg 59.7 kg    Examination:  General exam: Appears calm and comfortable, nontachypneic, nad Respiratory system: mild rhonchi bilaterally, good respiratory effort.  No wheezing or rales Cardiovascular system: RRR S1 & S2 heard,  No murmurs, rubs, gallops or clicks.  Gastrointestinal system: Abdomen is nondistended, soft and nontender. Normal bowel sounds heard.  No rebound or guarding Central nervous system: Alert and oriented. No focal neurological deficits. Extremitie no edema or cyanosis Psychiatry: Judgement and insight appear normal. Mood & affect appropriate.     Data Reviewed: I have personally reviewed following labs and imaging studies  CBC: Recent Labs  Lab 08/31/19 1512 09/01/19 0439 09/03/19 0457  WBC 7.4 3.2* 6.8  NEUTROABS 4.6  --   --   HGB 11.0* 10.3* 9.4*  HCT 33.3* 32.8* 30.4*  MCV 92.2 96.8 97.7  PLT 158 115* [id number]*   Basic Metabolic Panel: Recent Labs  Lab 08/31/19 1512 09/01/19 0439 09/01/19 1622 09/03/19 0457  NA 137 137  --  138  K 4.7 5.5* 4.8 5.0  CL 94* 99  --  100  CO2 34* 33*  --  32  GLUCOSE 109* 233*  --  124*  BUN 15 22  --  31*  CREATININE 1.18* 1.40*  --  0.99  CALCIUM 9.4 8.7*  --  8.5*   GFR: Estimated Creatinine Clearance: 35.2 mL/min (by C-G formula based on SCr of 0.99 mg/dL). Liver Function Tests: Recent Labs  Lab 08/31/19 1512  AST 22  ALT 11  ALKPHOS 50  BILITOT 0.7  PROT 7.9  ALBUMIN 4.0   No results for input(s): LIPASE, AMYLASE in the last 168 hours. No results for input(s): AMMONIA in  the last 168 hours. Coagulation Profile: No results for input(s): INR, PROTIME in the last 168 hours. Cardiac Enzymes: No results for input(s): CKTOTAL, CKMB, CKMBINDEX, TROPONINI in the last 168 hours. BNP (last 3 results) No results for input(s): PROBNP in the last 8760 hours. HbA1C: No results for input(s): HGBA1C in the last 72 hours. CBG: Recent Labs  Lab 09/02/19 0810 09/02/19 2139 09/03/19 0832  GLUCAP 117* 145* 75   Lipid Profile: No results for input(s): CHOL, HDL, LDLCALC, TRIG, CHOLHDL, LDLDIRECT in the last 72 hours. Thyroid Function Tests: No results for input(s): TSH, T4TOTAL, FREET4, T3FREE, THYROIDAB in the last 72 hours. Anemia Panel: Recent Labs    08/31/19 1512  FERRITIN 46   Sepsis Labs: Recent Labs  Lab 08/31/19 1512  PROCALCITON <0.10    Recent Results (from the past 240 hour(s))  Respiratory Panel by RT PCR (Flu A&B, Covid) - Nasopharyngeal Swab     Status: None   Collection Time: 08/31/19  4:36 PM   Specimen: Nasopharyngeal Swab  Result Value Ref Range Status   SARS Coronavirus 2 by RT PCR NEGATIVE NEGATIVE Final    Comment: (NOTE) SARS-CoV-2 target nucleic acids are NOT DETECTED. The SARS-CoV-2 RNA is generally detectable in upper respiratoy specimens during the acute phase of infection. The lowest concentration of SARS-CoV-2 viral copies this assay can detect is 131 copies/mL. A negative result does not preclude SARS-Cov-2 infection and should not be used as the sole basis for treatment or other patient management decisions. A negative result may occur with  improper specimen collection/handling, submission of specimen other than nasopharyngeal swab, presence of viral mutation(s) within the areas targeted by this assay, and inadequate number of viral copies (<131 copies/mL). A negative result must be combined with clinical observations, patient history, and epidemiological information. The expected result is Negative. Fact Sheet for  Patients:  PinkCheek.be Fact Sheet for Healthcare Providers:  GravelBags.it This test is not yet ap proved or cleared by the [place] FDA and  has been authorized for detection and/or diagnosis of SARS-CoV-2 by FDA under an Emergency Use Authorization (EUA). This EUA will remain  in effect (meaning this test can be used) for the duration of the COVID-19 declaration under Section 564(b)(1) of the Act, 21 U.S.C. section 360bbb-3(b)(1), unless the authorization is terminated or revoked sooner.    Influenza A by PCR NEGATIVE NEGATIVE Final   Influenza B by PCR NEGATIVE NEGATIVE Final    Comment: (NOTE) The Xpert Xpress SARS-CoV-2/FLU/RSV assay is intended as an aid in  the diagnosis of influenza from Nasopharyngeal swab specimens and  should not be used as a sole basis for treatment. Nasal washings and  aspirates are unacceptable for Xpert Xpress SARS-CoV-2/FLU/RSV  testing. Fact Sheet for Patients: PinkCheek.be Fact Sheet for Healthcare Providers: GravelBags.it This test is not yet approved or cleared by the [place] FDA and  has been authorized for detection and/or diagnosis of SARS-CoV-2 by  FDA under an Emergency Use Authorization (EUA). This EUA will remain  in effect (meaning  this test can be used) for the duration of the  Covid-19 declaration under Section 564(b)(1) of the Act, 21  U.S.C. section 360bbb-3(b)(1), unless the authorization is  terminated or revoked. Performed at [facility], [place],  [address]   SARS CORONAVIRUS 2 (TAT 6-24 HRS) Nasopharyngeal Nasopharyngeal Swab     Status: None   Collection Time: 08/31/19  6:52 PM   Specimen: Nasopharyngeal Swab  Result Value Ref Range Status   SARS Coronavirus 2 NEGATIVE NEGATIVE Final    Comment: (NOTE) SARS-CoV-2 target nucleic acids are NOT DETECTED. The  SARS-CoV-2 RNA is generally detectable in upper and lower respiratory specimens during the acute phase of infection. Negative results do not preclude SARS-CoV-2 infection, do not rule out co-infections with other pathogens, and should not be used as the sole basis for treatment or other patient management decisions. Negative results must be combined with clinical observations, patient history, and epidemiological information. The expected result is Negative. Fact Sheet for Patients: SugarRoll.be Fact Sheet for Healthcare Providers: https://www.woods-mathews.com/ This test is not yet approved or cleared by the [place] FDA and  has been authorized for detection and/or diagnosis of SARS-CoV-2 by FDA under an Emergency Use Authorization (EUA). This EUA will remain  in effect (meaning this test can be used) for the duration of the COVID-19 declaration under Section 56 4(b)(1) of the Act, 21 U.S.C. section 360bbb-3(b)(1), unless the authorization is terminated or revoked sooner. Performed at [facility], [place] [address], [place],  [address]          Radiology Studies: No results found.      Scheduled Meds: . aspirin EC  81 mg Oral q morning - 10a  . busPIRone  7.5 mg Oral BID  . calcium-vitamin D  2 tablet Oral Daily  . clopidogrel  75 mg Oral Daily  . dexamethasone (DECADRON) injection  6 mg Intravenous Q24H  . enoxaparin (LOVENOX) injection  30 mg Subcutaneous Q24H  . fluticasone  2 spray Each Nare Daily  . influenza vaccine adjuvanted  0.5 mL Intramuscular Tomorrow-1000  . iron polysaccharides  150 mg Oral BID  . lidocaine  1 patch Transdermal Q24H  . mouth rinse  15 mL Mouth Rinse BID  . metoprolol succinate  12.5 mg Oral q morning - 10a  . mometasone-formoterol  2 puff Inhalation BID  . pantoprazole  40 mg Oral BID  . sodium chloride flush  3 mL Intravenous Q12H  . vitamin B-12  3,000 mcg Oral Daily  .  ascorbic acid  500 mg Oral Daily   Continuous Infusions: . remdesivir 100 mg in NS 100 mL 100 mg (09/03/19 1021)    Assessment & Plan:   Active Problems:   Respiratory failure with hypoxia (HCC)  Acute hypoxic respiratory failure./COPD exacerbation/CAP Although initial COVID-19 test is negative. Patient is high risk due to sick contact as her husband recently diagnosed with COVID-19 pneumonia. (he is inpatient).  Chest x-ray with bilateral infiltrate consistent with COVID-19 pneumonia. Patient is high risk because of underlying COPD. CTA no PE, b/l ground glass infilatrate highly suggestive of viral pna. Third Covid test negative, but treating for presumed covid due to high risk , still on airborne precaution and started on  Remdesvir  Slow improvement, still requiring high 02.  Will continue with  Decadron 6 g IV daily -Continue home inhalers. -Ceftriaxone and azithromycin for possible CAP. CRP elevated Procalcitonin<0.10 D/c'd  theophyline as pharmacy stated pt not taking at home.  CAD withHFrEF.  Recent right and left cardiac catheterization.Patient fraction of 45 to 50%,and 100% stenosis of proximal circumflex, 99% stenosis of RCA.She was high risk for PCI and was advised to continue with dual antiplatelets. Mildly elevated troponin-most likely demand ischemia.no cp. Euvolemic on exam Continue on Plavix, aspirin, and Toprol Decrease toprol xl to 12.5 mg qd for softer bp Holding losartan due to elevated K   AKI.Mildly elevated  On ivf since had cta. -Improved D/c IVF -Avoid nephrotoxic.- hold losartan for now -give kayexalate 15gm since K 5.0   Pancytopenia- likely due to infection. Improving Continue to monitor.   DVT prophylaxis:Lovenox Code Status:Full code Family Communication:No family at bedside. Disposition Plan:likely be here 2 [place] stays until medically more stable.  Need to wean down on baseline o2.      LOS: 3 days   Time  spent: 45 minutes with more than 50% on [place], MD [facility] Pager [phone]  If 7PM-7AM, please contact night-coverage [email or web address] Password [email or web address] 09/03/2019, 1:51 PM Patient ID: Catherine Landry, female   DOB: 1936-08-12, 83 y.o.   MRN: [id number]

## 2019-09-03 NOTE — Plan of Care (Signed)
  Problem: Education: Goal: Knowledge of General Education information will improve Description: Including pain rating scale, medication(s)/side effects and non-pharmacologic comfort measures Outcome: Progressing   Problem: Clinical Measurements: Goal: Will remain free from infection Outcome: Progressing Goal: Respiratory complications will improve Outcome: Progressing   Problem: Safety: Goal: Ability to remain free from injury will improve Outcome: Progressing   

## 2019-09-04 ENCOUNTER — Inpatient Hospital Stay: Payer: Medicare Other

## 2019-09-04 DIAGNOSIS — R918 Other nonspecific abnormal finding of lung field: Secondary | ICD-10-CM

## 2019-09-04 DIAGNOSIS — J9621 Acute and chronic respiratory failure with hypoxia: Secondary | ICD-10-CM

## 2019-09-04 DIAGNOSIS — I5031 Acute diastolic (congestive) heart failure: Secondary | ICD-10-CM

## 2019-09-04 DIAGNOSIS — J9601 Acute respiratory failure with hypoxia: Secondary | ICD-10-CM

## 2019-09-04 LAB — BASIC METABOLIC PANEL
Anion gap: 7 (ref 5–15)
BUN: 27 mg/dL — ABNORMAL HIGH (ref 8–23)
CO2: 34 mmol/L — ABNORMAL HIGH (ref 22–32)
Calcium: 8.4 mg/dL — ABNORMAL LOW (ref 8.9–10.3)
Chloride: 100 mmol/L (ref 98–111)
Creatinine, Ser: 0.99 mg/dL (ref 0.44–1.00)
GFR calc Af Amer: >60 mL/min (ref >60)
GFR calc non Af Amer: 53 mL/min — ABNORMAL LOW (ref >60)
Glucose, Bld: 91 mg/dL (ref 70–99)
Potassium: 4.4 mmol/L (ref 3.5–5.1)
Sodium: 141 mmol/L (ref 135–145)

## 2019-09-04 LAB — BRAIN NATRIURETIC PEPTIDE: B Natriuretic Peptide: 1053 pg/mL — ABNORMAL HIGH (ref 0.0–100.0)

## 2019-09-04 LAB — SAR COV2 SEROLOGY (COVID19)AB(IGG),IA: SARS-CoV-2 Ab, IgG: NONREACTIVE

## 2019-09-04 LAB — GLUCOSE, CAPILLARY
Glucose-Capillary: 82 mg/dL (ref 70–99)
Glucose-Capillary: 143 mg/dL — ABNORMAL HIGH (ref 70–99)

## 2019-09-04 LAB — MRSA PCR SCREENING: MRSA by PCR: NEGATIVE

## 2019-09-04 MED ORDER — VANCOMYCIN HCL IN DEXTROSE 1-5 GM/200ML-% IV SOLN: 1000.0000 mg | Freq: Two times a day (BID) | INTRAVENOUS | Status: DC

## 2019-09-04 MED ORDER — SODIUM CHLORIDE 0.9 % IV SOLN
INTRAVENOUS | Status: DC | PRN
  Administered 2019-09-04 – 2019-09-06 (×4): 250 mL via INTRAVENOUS

## 2019-09-04 MED ORDER — SODIUM CHLORIDE 0.9 % IV SOLN: 2.0000 g | Freq: Two times a day (BID) | INTRAVENOUS | Status: DC

## 2019-09-04 MED ORDER — SODIUM CHLORIDE 0.9 % IV SOLN
2.0000 g | Freq: Two times a day (BID) | INTRAVENOUS | Status: DC
  Administered 2019-09-04 – 2019-09-08 (×9): 2 g via INTRAVENOUS
  Filled 2019-09-04 (×13): qty 2

## 2019-09-04 MED ORDER — PREDNISONE 20 MG PO TABS
40.0000 mg | ORAL_TABLET | Freq: Every day | ORAL | Status: DC
  Administered 2019-09-05 – 2019-09-10 (×6): 40 mg via ORAL
  Filled 2019-09-04 (×6): qty 2

## 2019-09-04 MED ORDER — FUROSEMIDE 10 MG/ML IJ SOLN
40.0000 mg | Freq: Two times a day (BID) | INTRAMUSCULAR | Status: DC
  Administered 2019-09-04 – 2019-09-05 (×3): 40 mg via INTRAVENOUS
  Filled 2019-09-04 (×3): qty 4

## 2019-09-04 MED ORDER — VANCOMYCIN HCL IN DEXTROSE 1-5 GM/200ML-% IV SOLN: 1000.0000 mg | INTRAVENOUS | Status: DC

## 2019-09-04 MED ORDER — VANCOMYCIN HCL 10 G IV SOLR
1500.0000 mg | Freq: Once | INTRAVENOUS | Status: AC
  Administered 2019-09-04: 1500 mg via INTRAVENOUS
  Filled 2019-09-04: qty 1500

## 2019-09-04 MED ORDER — SODIUM CHLORIDE 0.9 % IV SOLN
500.0000 mg | INTRAVENOUS | Status: DC
  Administered 2019-09-04 – 2019-09-07 (×4): 500 mg via INTRAVENOUS
  Filled 2019-09-04 (×5): qty 500

## 2019-09-04 NOTE — Progress Notes (Signed)
Update given to Caspian, brother and HPOA, over the phone. Made sure advance directive copy was in patient's chart per Mr. Thomson's request. Copy is in the chart. Brother appreciated call.

## 2019-09-04 NOTE — Consult Note (Addendum)
Reason for Consult: Acute on chronic respiratory failure Referring Physician: Nolberto Hanlon, MD  Catherine Landry is an 83 y.o. female.  HPI: This is an 83 year old female, former smoker (quit 19 years ago, 60-pack-year history) with a history of COPD/emphysema and chronic hypoxic respiratory failure on 2 L nasal cannula who presented on 31 August 2019 with a complaint of increasing shortness of breath.  He had had recent COVID-19 exposure as her husband had tested positive.  The patient was noted to have increasing requirements on her baseline 2 L of oxygen to up to 6 L of oxygen.  He had had cough that was pretty much nonproductive and generalized malaise.  He stated prior to admission she had subjective fevers and chills but has had none since admission.  Her main complaints are those of nausea, vomiting and diarrhea which she also had prior to admission.  Currently only the nausea remains.  She has not had any chest pain.  No orthopnea or paroxysmal nocturnal dyspnea no lower extremity edema.  No anosmia no dysgeusia.  He has had no hemoptysis  The patient was admitted as she had bilateral groundglass opacities consistent with potential viral pneumonia.  CTA performed on the day of admission showed no PE patient does have extensive emphysema.  Past Medical History:  Diagnosis Date  . Arthritis   . Asthma   . Bladder cancer ([place]) 2015  . Cancer [facility]) 2001   kidney left   . CHF (congestive heart failure) ([place])   . COPD (chronic obstructive pulmonary disease) ([place])   . GERD (gastroesophageal reflux disease)   . Gout   . History of bladder cancer   . History of kidney cancer   . Hypertension   . Raynaud's disease   . Renal insufficiency    Patient had last pulmonary function testing on 08 December 2016 FEV1 was 0.61 L or 33% of predicted with an FVC of 1.40 L or 55% predicted.  Her FEV1/FVC was 44%.  She had no significant bronchodilator response.  Diffusion capacity was severely decreased at  25%.  This is consistent with very severe/end-stage COPD.  Past Surgical History:  Procedure Laterality Date  . APPENDECTOMY    . BACK SURGERY    . CATARACT EXTRACTION    . COLONOSCOPY WITH PROPOFOL N/A 10/15/2016   Procedure: COLONOSCOPY WITH PROPOFOL;  Surgeon: Lollie Sails, MD;  Location: [facility] ENDOSCOPY;  Service: Endoscopy;  Laterality: N/A;  . ESOPHAGOGASTRODUODENOSCOPY (EGD) WITH PROPOFOL N/A 10/15/2016   Procedure: ESOPHAGOGASTRODUODENOSCOPY (EGD) WITH PROPOFOL;  Surgeon: Lollie Sails, MD;  Location: [facility] ENDOSCOPY;  Service: Endoscopy;  Laterality: N/A;  . EYE SURGERY Left 12/2018  . NEPHRECTOMY    . RIGHT/LEFT HEART CATH AND CORONARY ANGIOGRAPHY N/A 06/21/2019   Procedure: RIGHT/LEFT HEART CATH AND CORONARY ANGIOGRAPHY;  Surgeon: Teodoro Spray, MD;  Location: [place] CV LAB;  Service: Cardiovascular;  Laterality: N/A;    Family History  Problem Relation Age of Onset  . Breast cancer Mother 76  . Stroke Mother   . Breast cancer Other 39  . Heart disease Father     Social History:  reports that she quit smoking about 19 years ago. She has a 60.00 pack-year smoking history. She has never used smokeless tobacco. She reports current alcohol use. She reports that she does not use drugs.  Allergies:  Allergies  Allergen Reactions  . Nitrofurantoin Diarrhea and Other (See Comments)    Headache    Medications:  I have reviewed the  patient's current medications. Prior to Admission:  Medications Prior to Admission  Medication Sig Dispense Refill Last Dose  . [EXPIRED] amoxicillin-clavulanate (AUGMENTIN) 875-125 MG tablet Take 1 tablet by mouth 2 (two) times daily for 5 days. 10 tablet 0 08/31/2019 at Unknown time  . ascorbic acid (VITAMIN C) 500 MG tablet Take 1 tablet by mouth daily.   08/31/2019 at Unknown time  . busPIRone (BUSPAR) 7.5 MG tablet Take 7.5 mg by mouth 2 (two) times daily.   08/31/2019 at Unknown time  . Calcium Carbonate-Vit D-Min (CALCIUM  600+D PLUS MINERALS) 600-400 MG-UNIT TABS Take 2 tablets by mouth daily.   08/31/2019 at Unknown time  . clopidogrel (PLAVIX) 75 MG tablet Take 1 tablet (75 mg total) by mouth daily. 30 tablet 11 08/31/2019 at Unknown time  . furosemide (LASIX) 20 MG tablet Take 20 mg by mouth daily.   08/31/2019 at Unknown time  . iron polysaccharides (NIFEREX) 150 MG capsule Take 150 mg by mouth 2 (two) times daily.    08/31/2019 at Unknown time  . levalbuterol (XOPENEX) 0.63 MG/3ML nebulizer solution INHALE THE CONTENTS OF 1 VIAL IN NEBULIZER EVERY 6 HRS AS NEEDED FOR WHEEZING OR SHORTNESS OF BREATH 180 mL 0 08/31/2019 at Unknown time  . losartan (COZAAR) 50 MG tablet Take 50 mg by mouth every morning.   08/31/2019 at Unknown time  . metoprolol succinate (TOPROL-XL) 25 MG 24 hr tablet Take 25 mg by mouth every morning.   08/31/2019 at Unknown time  . Multiple Vitamin (MULTIVITAMIN WITH MINERALS) TABS Take 1 tablet by mouth every morning.   08/31/2019 at Unknown time  . pantoprazole (PROTONIX) 40 MG tablet Take 40 mg by mouth 2 (two) times daily.   08/31/2019 at Unknown time  . theophylline (THEODUR) 300 MG 12 hr tablet Take 300 mg by mouth daily.   08/31/2019 at Unknown time  . vitamin B-12 (CYANOCOBALAMIN) 1000 MCG tablet Take 3,000 mcg by mouth daily.   08/30/2019 at Unknown time  . acetaminophen (TYLENOL) 650 MG CR tablet Take 650 mg by mouth every 8 (eight) hours as needed for pain.   prn at prn  . aspirin EC 81 MG tablet Take 81 mg by mouth every morning.     . COMBIVENT RESPIMAT 20-100 MCG/ACT AERS respimat Inhale 1 puff into the lungs 4 (four) times daily as needed for wheezing or shortness of breath.   5 PRN at PRN  . fluticasone (FLONASE) 50 MCG/ACT nasal spray Place 2 sprays into both nostrils daily.   PRN at PRN  . Fluticasone-Salmeterol (ADVAIR) 250-50 MCG/DOSE AEPB Inhale 1 puff into the lungs 2 (two) times daily.   PRN at PRN  . nitroGLYCERIN (NITROSTAT) 0.4 MG SL tablet Place 0.4 mg under the tongue  every 5 (five) minutes as needed for chest pain.    PRN at PRN  . polyethylene glycol powder (GLYCOLAX/MIRALAX) powder Take 17 g by mouth daily as needed for moderate constipation. Mix with 4 to 8 ounces of fluid.   PRN at PRN  . theophylline (THEO-24) 300 MG 24 hr capsule Take 1 capsule (300 mg total) by mouth every morning. (Patient not taking: Reported on 08/31/2019) 30 capsule 0 Not Taking at Unknown time    Results for orders placed or performed during the hospital encounter of 08/31/19 (from the past 48 hour(s))  Glucose, capillary     Status: Abnormal   Collection Time: 09/02/19  9:39 PM  Result Value Ref Range   Glucose-Capillary 145 (H) 70 -  99 mg/dL  Basic metabolic panel     Status: Abnormal   Collection Time: 09/03/19  4:57 AM  Result Value Ref Range   Sodium 138 135 - 145 mmol/L   Potassium 5.0 3.5 - 5.1 mmol/L   Chloride 100 98 - 111 mmol/L   CO2 32 22 - 32 mmol/L   Glucose, Bld 124 (H) 70 - 99 mg/dL   BUN 31 (H) 8 - 23 mg/dL   Creatinine, Ser 0.99 0.44 - 1.00 mg/dL   Calcium 8.5 (L) 8.9 - 10.3 mg/dL   GFR calc non Af Amer 53 (L) >60 mL/min   GFR calc Af Amer >60 >60 mL/min   Anion gap 6 5 - 15    Comment: Performed at [facility], [address] [place], [place] [address]  CBC     Status: Abnormal   Collection Time: 09/03/19  4:57 AM  Result Value Ref Range   WBC 6.8 4.0 - 10.5 K/uL   RBC 3.11 (L) 3.87 - 5.11 MIL/uL   Hemoglobin 9.4 (L) 12.0 - 15.0 g/dL   HCT 30.4 (L) 36.0 - 46.0 %   MCV 97.7 80.0 - 100.0 fL   MCH 30.2 26.0 - 34.0 pg   MCHC 30.9 30.0 - 36.0 g/dL   RDW 14.6 11.5 - 15.5 %   Platelets 145 (L) 150 - 400 K/uL   nRBC 0.0 0.0 - 0.2 %    Comment: Performed at [facility], [address] [place], [place] [address]  Glucose, capillary     Status: None   Collection Time: 09/03/19  8:32 AM  Result Value Ref Range   Glucose-Capillary 75 70 - 99 mg/dL   Comment 1 Notify RN    Comment 2 Document in Chart   SAR CoV2 Serology (COVID  19)AB(IGG)IA     Status: None   Collection Time: 09/03/19  5:18 PM  Result Value Ref Range   SARS-CoV-2 Ab, IgG NON REACTIVE NON REACTIVE    Comment: (NOTE) Non-Reactive for SARS-CoV-2 IgG Antibodies.  SARS-CoV-2 IgG antibodies not detected.  Negative results do not preclude acute SARS-CoV-2 infection.  Negative results may occur in samples collected too soon following infection or in immunosuppressed patients.  Serologic results should not be used to diagnose or exclude active/recent SARS-CoV-2 infection.  If acute infection is suspected, direct testing for SARS-CoV-2 is necessary.   The expected result is Non-Reactive.  Fact Sheet for Recipients:  LimitBuy.nl  Fact Sheet for Healthcare Providers:  WordAgents.no  Testing was performed using the [employer] Coulter SARS-CoV-2 IgG assay.  This test is not yet approved or cleared by the [place] and has been authorized by FDA under an Emergency Use Authorization (EUA).  This EUA will remain in effect (meaning this test can be used) for the duration of the COVID-19 declaration under Section 564(b)(1) of the  Act, 21 U.S.C. section 360bbb-3(b)(1), unless the authorization is terminated or revoked sooner. Performed at [facility], [place] [address], [place], [place] [address]   Glucose, capillary     Status: Abnormal   Collection Time: 09/03/19  8:36 PM  Result Value Ref Range   Glucose-Capillary 168 (H) 70 - 99 mg/dL  Basic metabolic panel     Status: Abnormal   Collection Time: 09/04/19  5:26 AM  Result Value Ref Range   Sodium 141 135 - 145 mmol/L   Potassium 4.4 3.5 - 5.1 mmol/L   Chloride 100 98 - 111 mmol/L   CO2 34 (  H) 22 - 32 mmol/L   Glucose, Bld 91 70 - 99 mg/dL   BUN 27 (H) 8 - 23 mg/dL   Creatinine, Ser 0.99 0.44 - 1.00 mg/dL   Calcium 8.4 (L) 8.9 - 10.3 mg/dL   GFR calc non Af Amer 53 (L) >60 mL/min   GFR calc Af Amer >60 >60 mL/min   Anion gap 7 5 -  15    Comment: Performed at [facility], [address] [place], [place] [address]  Brain natriuretic peptide     Status: Abnormal   Collection Time: 09/04/19  5:26 AM  Result Value Ref Range   B Natriuretic Peptide 1,053.0 (H) 0.0 - 100.0 pg/mL    Comment: Performed at [facility], [address] [place], [place] [address]  Glucose, capillary     Status: None   Collection Time: 09/04/19  8:17 AM  Result Value Ref Range   Glucose-Capillary 82 70 - 99 mg/dL  Glucose, capillary     Status: Abnormal   Collection Time: 09/04/19 11:26 AM  Result Value Ref Range   Glucose-Capillary 143 (H) 70 - 99 mg/dL    DG Chest 1 View  Result Date: 09/04/2019 CLINICAL DATA:  Shortness of breath EXAM: CHEST  1 VIEW COMPARISON:  08/31/2019 FINDINGS: The heart size and mediastinal contours are stable. Minimal hazy opacity within both lungs most notable in the right upper lobe and left lower lobe without significant interval progression from prior. No pleural effusion or pneumothorax. IMPRESSION: Minimal hazy opacities without significant interval progression from prior. Electronically Signed   By: Davina Poke M.D.   On: 09/04/2019 16:39    Review of Systems  Constitutional: Positive for appetite change.  HENT: Negative.   Eyes: Negative.   Respiratory: Positive for cough and shortness of breath.   Cardiovascular: Negative.   Gastrointestinal: Positive for nausea.  Endocrine: Negative.   Genitourinary: Negative.   Musculoskeletal: Negative.   Skin: Negative.   Allergic/Immunologic: Negative.   Neurological: Negative.   Hematological: Negative.   Psychiatric/Behavioral: Negative.   All other systems reviewed and are negative.  Blood pressure 132/76, pulse 83, temperature 97.7 F (36.5 C), temperature source Oral, resp. rate 19, height 5\' 2"  (1.575 m), weight 58.9 kg, SpO2 93 %. Physical Exam  Nursing note and vitals reviewed. Constitutional: She is oriented to person,  place, and time. She appears well-developed. She appears listless. She appears ill. Nasal cannula in place.  HENT:  Head: Normocephalic and atraumatic.  Right Ear: External ear normal.  Left Ear: External ear normal.  Nose: Nose normal.  Eyes: Pupils are equal, round, and reactive to light. Conjunctivae are normal. No scleral icterus.  Neck: No JVD present. No tracheal deviation present. No thyromegaly present.  Cardiovascular: Normal rate, regular rhythm, normal heart sounds and intact distal pulses.  No murmur heard. Respiratory: No respiratory distress. She has no wheezes. She has no rhonchi. She has rales in the right lower field and the left lower field.  Distant BS, coarse BS  GI: Soft. Bowel sounds are normal. She exhibits no distension.  Musculoskeletal:        General: No deformity or edema.     Cervical back: Neck supple.  Lymphadenopathy:    She has no cervical adenopathy.  Neurological: She is oriented to person, place, and time. She appears listless.  Skin: Skin is warm and dry.  Psychiatric: She exhibits a depressed mood.       Assessment/Plan:  1.  Acute on  chronic hypoxic respiratory failure: This could be due to COPD exacerbation, note is made that she has had progressive decline since August with a similar presentation in August 2020.  She is in the very severe/end-stage COPD range and has been so since 2018.  She has extensive emphysematous changes on CT.  Continue oxygen supplementation and titrate as tolerated for saturations of 88 to 92%.  Currently I do not think that her metered-dose inhaler regimen is helping as I suspect she does not have sufficient breath-holding capacity to get complete benefit from bronchodilators.  However this is necessary if she is on isolation for potential COVID though she has had numerous COVID tests that are negative.  It would be her benefit if she could progress to taken off of isolation so that she could receive proper bronchodilator  therapy.  Agree with inhaled corticosteroids.  Agree with a azithromycin IV.  She was not tested for other viruses such as rhinovirus.  Influenza A&B were negative.  2.  Very severe COPD/end-stage: Bronchodilators, steroids, pulmonary toilet and oxygen supplementation as above.  She may benefit from discussion with palliative care as her disease is quite advanced.  3.  Bilateral groundglass opacities: These are nonspecific they can be seen with atypical/viral pneumonia not necessarily specific to COVID.  They can also be seen with pulmonary edema regularly asymmetric pulmonary edema which can be seen in patients with severe emphysema.  BNP today is over 1000, agree with diuretic therapy.  She has had recent cardiac evaluation and has preserved ejection fraction but could very well have decompensation of diastolic heart failure.  Thank you for allowing [place] to participate in this patient's care.  We will continue to follow along with you.      Renold Don, MD [facility]  09/04/2019, 5:24 PM

## 2019-09-04 NOTE — Progress Notes (Signed)
PROGRESS NOTE    Catherine Landry  [id number] DOB: [birth year] DOA: 08/31/2019 PCP: Tracie Harrier, MD    Brief Narrative:  Catherine Laubscher Masseyis a 83 y.o.femalewith medical history significant ofEleanor Rashawnda Landry a 83 y.o.femalewith past medical history of COPD on 2 L nasal cannula, CHF, GERD, renal cancer status post left nephrectomy who presents to the ED complaining of shortness of breath. Per patient she is experiencing worsening shortness of breath spite using her home oxygen. Her husband recently tested positive for COVID-19 and had similar symptoms. On arrival to ED patient was mildly tachycardic, tachypneic and saturating in 60s with 2 L. She was placed on nonrebreather which improved her saturation. Later transitioned to high flow nasal cannula. Initial POC COVID-19 testing was negative but chest x-ray shows bilateral infiltrate consistent with COVID-19 pneumonia. Respiratory viral panel negative.D-dimer was elevated at 1726.Ferritin within normal limit,BNP mildly elevated at 273.Troponin positive at 29.  She is presumably being treated for Covid 19 pneumonia    Consultants:   None  Procedures CT chest Patchy ground-glass infiltrates within the lungs bilaterally. Given the patient's recent exposure to COVID-19 this is highly suggestive of viral pneumonia. Scattered mediastinal lymph nodes are noted likely of a reactive nature. Stable scattered small nodules when compared with 2017 consistent with a benign etiology. No further follow-up is recommended. No evidence of pulmonary emboli. Aortic Atherosclerosis (ICD10-I70.0) and Emphysema (ICD10-J43.9).  Antimicrobials:   Remdesivir, ceftrixaone and azithromycin (was stopped? Only got couple of doses?)   Subjective: Still requiring 5-6L o2. Does appear looking better.  She reports shortness of breath is a little bit better but at times she becomes short of breath.  No chest pain    objective: Vitals:   09/03/19 1621 09/03/19 2031 09/04/19 0426 09/04/19 0820  BP: 133/82 132/67 137/79 135/76  Pulse: 90 84 91 81  Resp: 19   16  Temp: 98.3 F (36.8 C) 98.2 F (36.8 C) 98 F (36.7 C) 98 F (36.7 C)  TempSrc: Oral Oral Oral Oral  SpO2: 96% 94% 93% 93%  Weight:   58.9 kg   Height:        Intake/Output Summary (Last 24 hours) at 09/04/2019 1526 Last data filed at 09/04/2019 1128 Gross per 24 hour  Intake 220.37 ml  Output 2575 ml  Net -2354.63 ml   Filed Weights   08/31/19 1507 09/03/19 0354 09/04/19 0426  Weight: 54.9 kg 59.7 kg 58.9 kg    Examination:  General exam: Appears calm and comfortable, nontachypneic, nad Respiratory system: mild fine rales, b/l  No wheezing or rales Cardiovascular system: RRR S1 & S2 heard,  No murmurs, rubs, gallops or clicks.  Gastrointestinal system: Abdomen is nondistended, soft and nontender. Normal bowel sounds heard.  No rebound or guarding Central nervous system: Alert and oriented. No focal neurological deficits. Extremitie no edema or cyanosis Psychiatry: Judgement and insight appear normal. Mood & affect appropriate.     Data Reviewed: I have personally reviewed following labs and imaging studies  CBC: Recent Labs  Lab 08/31/19 1512 09/01/19 0439 09/03/19 0457  WBC 7.4 3.2* 6.8  NEUTROABS 4.6  --   --   HGB 11.0* 10.3* 9.4*  HCT 33.3* 32.8* 30.4*  MCV 92.2 96.8 97.7  PLT 158 115* [id number]*   Basic Metabolic Panel: Recent Labs  Lab 08/31/19 1512 09/01/19 0439 09/01/19 1622 09/03/19 0457 09/04/19 0526  NA 137 137  --  138 141  K 4.7 5.5* 4.8 5.0 4.4  CL 94* 99  --  100 100  CO2 34* 33*  --  32 34*  GLUCOSE 109* 233*  --  124* 91  BUN 15 22  --  31* 27*  CREATININE 1.18* 1.40*  --  0.99 0.99  CALCIUM 9.4 8.7*  --  8.5* 8.4*   GFR: Estimated Creatinine Clearance: 35.2 mL/min (by C-G formula based on SCr of 0.99 mg/dL). Liver Function Tests: Recent Labs  Lab 08/31/19 1512  AST 22  ALT 11   ALKPHOS 50  BILITOT 0.7  PROT 7.9  ALBUMIN 4.0   No results for input(s): LIPASE, AMYLASE in the last 168 hours. No results for input(s): AMMONIA in the last 168 hours. Coagulation Profile: No results for input(s): INR, PROTIME in the last 168 hours. Cardiac Enzymes: No results for input(s): CKTOTAL, CKMB, CKMBINDEX, TROPONINI in the last 168 hours. BNP (last 3 results) No results for input(s): PROBNP in the last 8760 hours. HbA1C: No results for input(s): HGBA1C in the last 72 hours. CBG: Recent Labs  Lab 09/02/19 2139 09/03/19 0832 09/03/19 2036 09/04/19 0817 09/04/19 1126  GLUCAP 145* 75 168* 82 143*   Lipid Profile: No results for input(s): CHOL, HDL, LDLCALC, TRIG, CHOLHDL, LDLDIRECT in the last 72 hours. Thyroid Function Tests: No results for input(s): TSH, T4TOTAL, FREET4, T3FREE, THYROIDAB in the last 72 hours. Anemia Panel: No results for input(s): VITAMINB12, FOLATE, FERRITIN, TIBC, IRON, RETICCTPCT in the last 72 hours. Sepsis Labs: Recent Labs  Lab 08/31/19 1512  PROCALCITON <0.10    Recent Results (from the past 240 hour(s))  Respiratory Panel by RT PCR (Flu A&B, Covid) - Nasopharyngeal Swab     Status: None   Collection Time: 08/31/19  4:36 PM   Specimen: Nasopharyngeal Swab  Result Value Ref Range Status   SARS Coronavirus 2 by RT PCR NEGATIVE NEGATIVE Final    Comment: (NOTE) SARS-CoV-2 target nucleic acids are NOT DETECTED. The SARS-CoV-2 RNA is generally detectable in upper respiratoy specimens during the acute phase of infection. The lowest concentration of SARS-CoV-2 viral copies this assay can detect is 131 copies/mL. A negative result does not preclude SARS-Cov-2 infection and should not be used as the sole basis for treatment or other patient management decisions. A negative result may occur with  improper specimen collection/handling, submission of specimen other than nasopharyngeal swab, presence of viral mutation(s) within the areas  targeted by this assay, and inadequate number of viral copies (<131 copies/mL). A negative result must be combined with clinical observations, patient history, and epidemiological information. The expected result is Negative. Fact Sheet for Patients:  PinkCheek.be Fact Sheet for Healthcare Providers:  GravelBags.it This test is not yet ap proved or cleared by the [place] FDA and  has been authorized for detection and/or diagnosis of SARS-CoV-2 by FDA under an Emergency Use Authorization (EUA). This EUA will remain  in effect (meaning this test can be used) for the duration of the COVID-19 declaration under Section 564(b)(1) of the Act, 21 U.S.C. section 360bbb-3(b)(1), unless the authorization is terminated or revoked sooner.    Influenza A by PCR NEGATIVE NEGATIVE Final   Influenza B by PCR NEGATIVE NEGATIVE Final    Comment: (NOTE) The Xpert Xpress SARS-CoV-2/FLU/RSV assay is intended as an aid in  the diagnosis of influenza from Nasopharyngeal swab specimens and  should not be used as a sole basis for treatment. Nasal washings and  aspirates are unacceptable for Xpert Xpress SARS-CoV-2/FLU/RSV  testing. Fact Sheet for Patients: PinkCheek.be Fact Sheet for Healthcare Providers: GravelBags.it This test  is not yet approved or cleared by the [place] and  has been authorized for detection and/or diagnosis of SARS-CoV-2 by  FDA under an Emergency Use Authorization (EUA). This EUA will remain  in effect (meaning this test can be used) for the duration of the  Covid-19 declaration under Section 564(b)(1) of the Act, 21  U.S.C. section 360bbb-3(b)(1), unless the authorization is  terminated or revoked. Performed at [facility], [place], [place] [address]   SARS CORONAVIRUS 2 (TAT 6-24 HRS) Nasopharyngeal Nasopharyngeal Swab      Status: None   Collection Time: 08/31/19  6:52 PM   Specimen: Nasopharyngeal Swab  Result Value Ref Range Status   SARS Coronavirus 2 NEGATIVE NEGATIVE Final    Comment: (NOTE) SARS-CoV-2 target nucleic acids are NOT DETECTED. The SARS-CoV-2 RNA is generally detectable in upper and lower respiratory specimens during the acute phase of infection. Negative results do not preclude SARS-CoV-2 infection, do not rule out co-infections with other pathogens, and should not be used as the sole basis for treatment or other patient management decisions. Negative results must be combined with clinical observations, patient history, and epidemiological information. The expected result is Negative. Fact Sheet for Patients: SugarRoll.be Fact Sheet for Healthcare Providers: https://www.woods-mathews.com/ This test is not yet approved or cleared by the [place] FDA and  has been authorized for detection and/or diagnosis of SARS-CoV-2 by FDA under an Emergency Use Authorization (EUA). This EUA will remain  in effect (meaning this test can be used) for the duration of the COVID-19 declaration under Section 56 4(b)(1) of the Act, 21 U.S.C. section 360bbb-3(b)(1), unless the authorization is terminated or revoked sooner. Performed at [facility], [place] [address], [place], [place] [address]          Radiology Studies: No results found.      Scheduled Meds: . aspirin EC  81 mg Oral q morning - 10a  . busPIRone  7.5 mg Oral BID  . calcium-vitamin D  2 tablet Oral Daily  . clopidogrel  75 mg Oral Daily  . dexamethasone (DECADRON) injection  6 mg Intravenous Q24H  . enoxaparin (LOVENOX) injection  40 mg Subcutaneous Q24H  . fluticasone  2 spray Each Nare Daily  . furosemide  40 mg Intravenous BID  . influenza vaccine adjuvanted  0.5 mL Intramuscular Tomorrow-1000  . iron polysaccharides  150 mg Oral BID  . lidocaine  1 patch Transdermal  Q24H  . mouth rinse  15 mL Mouth Rinse BID  . metoprolol succinate  12.5 mg Oral q morning - 10a  . mometasone-formoterol  2 puff Inhalation BID  . pantoprazole  40 mg Oral BID  . sodium chloride flush  3 mL Intravenous Q12H  . vitamin B-12  3,000 mcg Oral Daily  . ascorbic acid  500 mg Oral Daily   Continuous Infusions: . sodium chloride Stopped (09/04/19 1016)  . remdesivir 100 mg in NS 100 mL Stopped (09/04/19 1012)    Assessment & Plan:   Active Problems:   Respiratory failure with hypoxia (HCC)  Acute hypoxic respiratory failure./COPD exacerbation/CAP Although initial COVID-19 test is negative. Patient is high risk due to sick contact as her husband recently diagnosed with COVID-19 pneumonia. (he is inpatient).  Chest x-ray with bilateral infiltrate consistent with COVID-19 pneumonia. Patient is high risk because of underlying COPD. CTA no PE, b/l ground glass infilatrate highly suggestive of viral pna. Third Covid test negative, but treating for presumed covid due  to high risk , still on airborne precaution and started on  Remdesvir  switch  Decadron 6 g IV daily to po Predenisone 40mg  daily with taper -Continue home inhalers. -Ceftriaxone and azithromycin for possible CAP.Marland Kitchen>but abx d/c'd? Will start azithromycin and cefepime now, spoke to pharmacy about this BNP elevated-will add lasix iv bid CRP elevated Procalcitonin<0.10 D/c'd  theophyline as pharmacy stated pt not taking at home.    CAD withHFrEF.  Recent right and left cardiac catheterization.Patient fraction of 45 to 50%,and 100% stenosis of proximal circumflex, 99% stenosis of RCA.She was high risk for PCI and was advised to continue with dual antiplatelets. Mildly elevated troponin-most likely demand ischemia.no cp. Euvolemic on exam Continue on Plavix, aspirin, and Toprol Decrease toprol xl to 12.5 mg qd for softer bp Holding losartan due to elevated K Mildly volume overloaded, bnp up, start  lasix iv bid   AKI.Mildly elevated  On ivf since had cta. -Improved D/c IVF -Avoid nephrotoxic.- hold losartan for now -given kayexalate 15gm since K 5.0.Marland KitchenMarland Kitchen>now 4.4   Pancytopenia- likely due to infection. Improving Continue to monitor.   DVT prophylaxis:Lovenox Code Status:Full code Family Communication:No family at bedside. Disposition Plan:likely be here 2 [place] stays until medically more stable.  Need to wean down on baseline o2.      LOS: 4 days   Time spent: 45 minutes with more than 50% on [place], MD [facility] Pager [phone]  If 7PM-7AM, please contact night-coverage [email or web address] Password [facility] 09/04/2019, 3:26 PM Patient ID: Catherine Landry, female   DOB: Sep 06, 1936, 83 y.o.   MRN: [id number] Patient ID: Catherine Landry, female   DOB: [birth year], 83 y.o.   MRN: [id number]

## 2019-09-04 NOTE — Plan of Care (Signed)
  Problem: Education: Goal: Knowledge of General Education information will improve Description: Including pain rating scale, medication(s)/side effects and non-pharmacologic comfort measures Outcome: Progressing   Problem: Clinical Measurements: Goal: Respiratory complications will improve Outcome: Progressing   Problem: Safety: Goal: Ability to remain free from injury will improve Outcome: Progressing   

## 2019-09-04 NOTE — Consult Note (Addendum)
Pharmacy Antibiotic Note  Catherine Landry is a 83 y.o. female admitted on 08/31/2019 with pneumonia.  Pharmacy has been consulted for Cefepime and Vancomycin dosing. Patient is COVID NEGATIVE x3 and is currently on remdesivir.   Azithromycin started today.   Plan: 1. Cefepime 2g Q12H (Crcl 30-60 mL/min)  2. Vancomycin 1500 mg x1 dose. Will order vancomycin maintenance dose 1000 mg Q36H. AUC goal 400-550. Expected AUC 467.1. Expected Cssmin: 10.3 Pharmacy ordered creatinine to be checked tomorrow and STAT MRSA PCR  Height: 5\' 2"  (157.5 cm) Weight: 129 lb 12.8 oz (58.9 kg) IBW/kg (Calculated) : 50.1  Temp (24hrs), Avg:98.1 F (36.7 C), Min:98 F (36.7 C), Max:98.3 F (36.8 C)  Recent Labs  Lab 08/31/19 1512 09/01/19 0439 09/03/19 0457 09/04/19 0526  WBC 7.4 3.2* 6.8  --   CREATININE 1.18* 1.40* 0.99 0.99    Estimated Creatinine Clearance: 35.2 mL/min (by C-G formula based on SCr of 0.99 mg/dL).    Allergies  Allergen Reactions  . Nitrofurantoin Diarrhea and Other (See Comments)    Headache    Update @ 2016: Messaged Sherrilyn Rist about negative MRSA PCR and vancomycin being d/c'd. Provider okay'ed discontinuation of Vancomycin.   Thank you for allowing pharmacy to be a part of this patient's care.  Rowland Lathe 09/04/2019 3:54 PM

## 2019-09-05 LAB — BASIC METABOLIC PANEL
Anion gap: 10 (ref 5–15)
BUN: 30 mg/dL — ABNORMAL HIGH (ref 8–23)
CO2: 34 mmol/L — ABNORMAL HIGH (ref 22–32)
Calcium: 7.7 mg/dL — ABNORMAL LOW (ref 8.9–10.3)
Chloride: 94 mmol/L — ABNORMAL LOW (ref 98–111)
Creatinine, Ser: 0.97 mg/dL (ref 0.44–1.00)
GFR calc Af Amer: >60 mL/min (ref >60)
GFR calc non Af Amer: 55 mL/min — ABNORMAL LOW (ref >60)
Glucose, Bld: 95 mg/dL (ref 70–99)
Potassium: 4.1 mmol/L (ref 3.5–5.1)
Sodium: 138 mmol/L (ref 135–145)

## 2019-09-05 LAB — CREATININE, SERUM
Creatinine, Ser: 1.02 mg/dL — ABNORMAL HIGH (ref 0.44–1.00)
GFR calc Af Amer: 60 mL/min — ABNORMAL LOW (ref >60)
GFR calc non Af Amer: 52 mL/min — ABNORMAL LOW (ref >60)

## 2019-09-05 LAB — GLUCOSE, CAPILLARY: Glucose-Capillary: 75 mg/dL (ref 70–99)

## 2019-09-05 LAB — BRAIN NATRIURETIC PEPTIDE: B Natriuretic Peptide: 581 pg/mL — ABNORMAL HIGH (ref 0.0–100.0)

## 2019-09-05 MED ORDER — FUROSEMIDE 10 MG/ML IJ SOLN
20.0000 mg | Freq: Two times a day (BID) | INTRAMUSCULAR | Status: DC
  Administered 2019-09-05 – 2019-09-08 (×6): 20 mg via INTRAVENOUS
  Filled 2019-09-05 (×6): qty 2

## 2019-09-05 MED ORDER — IPRATROPIUM-ALBUTEROL 0.5-2.5 (3) MG/3ML IN SOLN
3.0000 mL | RESPIRATORY_TRACT | Status: DC
  Administered 2019-09-05 – 2019-09-06 (×5): 3 mL via RESPIRATORY_TRACT
  Filled 2019-09-05 (×5): qty 3

## 2019-09-05 NOTE — Care Management Important Message (Signed)
Important Message  Patient Details  Name: Catherine Landry MRN: [id number] Date of Birth: Feb 10, 1936   Medicare Important Message Given:  Yes  Reviewed verbally over phone with patient.  Will bring copy of Medicare IM to room now that she's off isolation.    Dannette Barbara 09/05/2019, 2:13 PM

## 2019-09-05 NOTE — Clinical Social Work Note (Signed)
CSW spoke with patient. She does not want any information shared with anyone except for her brother who is HCPOA: Animator. His contact information is listed in the emergency contacts. Sent message to MD to notify.  Dayton Scrape, [place]

## 2019-09-05 NOTE — Progress Notes (Signed)
PROGRESS NOTE    Catherine Landry  [id number] DOB: 31-May-1936 DOA: 08/31/2019 PCP: Tracie Harrier, MD    Brief Narrative:  Catherine Mofield Masseyis a 83 y.o.femalewith medical history significant ofEleanor Wilhemena Landry a 83 y.o.femalewith past medical history of COPD on 2 L nasal cannula, CHF, GERD, renal cancer status post left nephrectomy who presents to the ED complaining of shortness of breath. Per patient she is experiencing worsening shortness of breath spite using her home oxygen. Her husband recently tested positive for COVID-19 and had similar symptoms. On arrival to ED patient was mildly tachycardic, tachypneic and saturating in 60s with 2 L. She was placed on nonrebreather which improved her saturation. Later transitioned to high flow nasal cannula. Initial POC COVID-19 testing was negative but chest x-ray shows bilateral infiltrate consistent with COVID-19 pneumonia. Respiratory viral panel negative.D-dimer was elevated at 1726.Ferritin within normal limit,BNP mildly elevated at 273.Troponin positive at 29.  She is presumably being treated for Covid 19 pneumonia    Consultants:   None  Procedures CT chest Patchy ground-glass infiltrates within the lungs bilaterally. Given the patient's recent exposure to COVID-19 this is highly suggestive of viral pneumonia. Scattered mediastinal lymph nodes are noted likely of a reactive nature. Stable scattered small nodules when compared with 2017 consistent with a benign etiology. No further follow-up is recommended. No evidence of pulmonary emboli. Aortic Atherosclerosis (ICD10-I70.0) and Emphysema (ICD10-J43.9).  Antimicrobials:   Remdesivir, ceftrixaone and azithromycin (was stopped? Only got couple of doses?)   Subjective: Patient reports feeling better with less short of breath but not at baseline.  No other symptoms.  objective: Vitals:   09/04/19 1650 09/04/19 2046 09/05/19 0516 09/05/19  0840  BP: 132/76 117/70 120/70 (!) 121/53  Pulse: 83 81 80 81  Resp: 19 19 20    Temp: 97.7 F (36.5 C) 97.9 F (36.6 C) 97.8 F (36.6 C) 98.2 F (36.8 C)  TempSrc: Oral Oral Oral Oral  SpO2: 93% 98% 97% 98%  Weight:   57.5 kg   Height:        Intake/Output Summary (Last 24 hours) at 09/05/2019 1540 Last data filed at 09/05/2019 1248 Gross per 24 hour  Intake 720.56 ml  Output 3200 ml  Net -2479.44 ml   Filed Weights   09/03/19 0354 09/04/19 0426 09/05/19 0516  Weight: 59.7 kg 58.9 kg 57.5 kg    Examination:  General exam: Appears calm and comfortable, nontachypneic, nad Respiratory system: mild fine rales, b/l  No wheezing or rhonchi Cardiovascular system: RRR S1 & S2 heard,  No murmurs, rubs, gallops or clicks.  Gastrointestinal system: Abdomen is nondistended, soft and nontender.+BS Central nervous system: Alert and oriented. No focal neurological deficits. Extremitie trace pedal edema, no cyanosis Psychiatry: Judgement and insight appear normal. Mood & affect appropriate.     Data Reviewed: I have personally reviewed following labs and imaging studies  CBC: Recent Labs  Lab 08/31/19 1512 09/01/19 0439 09/03/19 0457  WBC 7.4 3.2* 6.8  NEUTROABS 4.6  --   --   HGB 11.0* 10.3* 9.4*  HCT 33.3* 32.8* 30.4*  MCV 92.2 96.8 97.7  PLT 158 115* Q000111Q*   Basic Metabolic Panel: Recent Labs  Lab 08/31/19 1512 09/01/19 0439 09/01/19 1622 09/03/19 0457 09/04/19 0526 09/05/19 0452  NA 137 137  --  138 141 138  K 4.7 5.5* 4.8 5.0 4.4 4.1  CL 94* 99  --  100 100 94*  CO2 34* 33*  --  32 34* 34*  GLUCOSE 109*  233*  --  124* 91 95  BUN 15 22  --  31* 27* 30*  CREATININE 1.18* 1.40*  --  0.99 0.99 0.97  1.02*  CALCIUM 9.4 8.7*  --  8.5* 8.4* 7.7*   GFR: Estimated Creatinine Clearance: 34.2 mL/min (A) (by C-G formula based on SCr of 1.02 mg/dL (H)). Liver Function Tests: Recent Labs  Lab 08/31/19 1512  AST 22  ALT 11  ALKPHOS 50  BILITOT 0.7  PROT 7.9    ALBUMIN 4.0   No results for input(s): LIPASE, AMYLASE in the last 168 hours. No results for input(s): AMMONIA in the last 168 hours. Coagulation Profile: No results for input(s): INR, PROTIME in the last 168 hours. Cardiac Enzymes: No results for input(s): CKTOTAL, CKMB, CKMBINDEX, TROPONINI in the last 168 hours. BNP (last 3 results) No results for input(s): PROBNP in the last 8760 hours. HbA1C: No results for input(s): HGBA1C in the last 72 hours. CBG: Recent Labs  Lab 09/03/19 0832 09/03/19 2036 09/04/19 0817 09/04/19 1126 09/05/19 0839  GLUCAP 75 168* 82 143* 75   Lipid Profile: No results for input(s): CHOL, HDL, LDLCALC, TRIG, CHOLHDL, LDLDIRECT in the last 72 hours. Thyroid Function Tests: No results for input(s): TSH, T4TOTAL, FREET4, T3FREE, THYROIDAB in the last 72 hours. Anemia Panel: No results for input(s): VITAMINB12, FOLATE, FERRITIN, TIBC, IRON, RETICCTPCT in the last 72 hours. Sepsis Labs: Recent Labs  Lab 08/31/19 1512  PROCALCITON <0.10    Recent Results (from the past 240 hour(s))  Respiratory Panel by RT PCR (Flu A&B, Covid) - Nasopharyngeal Swab     Status: None   Collection Time: 08/31/19  4:36 PM   Specimen: Nasopharyngeal Swab  Result Value Ref Range Status   SARS Coronavirus 2 by RT PCR NEGATIVE NEGATIVE Final    Comment: (NOTE) SARS-CoV-2 target nucleic acids are NOT DETECTED. The SARS-CoV-2 RNA is generally detectable in upper respiratoy specimens during the acute phase of infection. The lowest concentration of SARS-CoV-2 viral copies this assay can detect is 131 copies/mL. A negative result does not preclude SARS-Cov-2 infection and should not be used as the sole basis for treatment or other patient management decisions. A negative result may occur with  improper specimen collection/handling, submission of specimen other than nasopharyngeal swab, presence of viral mutation(s) within the areas targeted by this assay, and inadequate  number of viral copies (<131 copies/mL). A negative result must be combined with clinical observations, patient history, and epidemiological information. The expected result is Negative. Fact Sheet for Patients:  PinkCheek.be Fact Sheet for Healthcare Providers:  GravelBags.it This test is not yet ap proved or cleared by the [place] FDA and  has been authorized for detection and/or diagnosis of SARS-CoV-2 by FDA under an Emergency Use Authorization (EUA). This EUA will remain  in effect (meaning this test can be used) for the duration of the COVID-19 declaration under Section 564(b)(1) of the Act, 21 U.S.C. section 360bbb-3(b)(1), unless the authorization is terminated or revoked sooner.    Influenza A by PCR NEGATIVE NEGATIVE Final   Influenza B by PCR NEGATIVE NEGATIVE Final    Comment: (NOTE) The Xpert Xpress SARS-CoV-2/FLU/RSV assay is intended as an aid in  the diagnosis of influenza from Nasopharyngeal swab specimens and  should not be used as a sole basis for treatment. Nasal washings and  aspirates are unacceptable for Xpert Xpress SARS-CoV-2/FLU/RSV  testing. Fact Sheet for Patients: PinkCheek.be Fact Sheet for Healthcare Providers: GravelBags.it This test is not yet approved or  cleared by the [place] and  has been authorized for detection and/or diagnosis of SARS-CoV-2 by  FDA under an Emergency Use Authorization (EUA). This EUA will remain  in effect (meaning this test can be used) for the duration of the  Covid-19 declaration under Section 564(b)(1) of the Act, 21  U.S.C. section 360bbb-3(b)(1), unless the authorization is  terminated or revoked. Performed at [facility], [place], [place] [address]   SARS CORONAVIRUS 2 (TAT 6-24 HRS) Nasopharyngeal Nasopharyngeal Swab     Status: None   Collection Time: 08/31/19   6:52 PM   Specimen: Nasopharyngeal Swab  Result Value Ref Range Status   SARS Coronavirus 2 NEGATIVE NEGATIVE Final    Comment: (NOTE) SARS-CoV-2 target nucleic acids are NOT DETECTED. The SARS-CoV-2 RNA is generally detectable in upper and lower respiratory specimens during the acute phase of infection. Negative results do not preclude SARS-CoV-2 infection, do not rule out co-infections with other pathogens, and should not be used as the sole basis for treatment or other patient management decisions. Negative results must be combined with clinical observations, patient history, and epidemiological information. The expected result is Negative. Fact Sheet for Patients: SugarRoll.be Fact Sheet for Healthcare Providers: https://www.woods-mathews.com/ This test is not yet approved or cleared by the [place] FDA and  has been authorized for detection and/or diagnosis of SARS-CoV-2 by FDA under an Emergency Use Authorization (EUA). This EUA will remain  in effect (meaning this test can be used) for the duration of the COVID-19 declaration under Section 56 4(b)(1) of the Act, 21 U.S.C. section 360bbb-3(b)(1), unless the authorization is terminated or revoked sooner. Performed at [facility], [address] [place], [place] [address]   MRSA PCR Screening     Status: None   Collection Time: 09/04/19  4:53 PM   Specimen: Nasopharyngeal  Result Value Ref Range Status   MRSA by PCR NEGATIVE NEGATIVE Final    Comment:        The GeneXpert MRSA Assay (FDA approved for NASAL specimens only), is one component of a comprehensive MRSA colonization surveillance program. It is not intended to diagnose MRSA infection nor to guide or monitor treatment for MRSA infections. Performed at [facility], [address] [place],  [address]          Radiology Studies: DG Chest 1 View  Result Date: 09/04/2019 CLINICAL  DATA:  Shortness of breath EXAM: CHEST  1 VIEW COMPARISON:  08/31/2019 FINDINGS: The heart size and mediastinal contours are stable. Minimal hazy opacity within both lungs most notable in the right upper lobe and left lower lobe without significant interval progression from prior. No pleural effusion or pneumothorax. IMPRESSION: Minimal hazy opacities without significant interval progression from prior. Electronically Signed   By: Davina Poke M.D.   On: 09/04/2019 16:39        Scheduled Meds: . aspirin EC  81 mg Oral q morning - 10a  . busPIRone  7.5 mg Oral BID  . calcium-vitamin D  2 tablet Oral Daily  . clopidogrel  75 mg Oral Daily  . enoxaparin (LOVENOX) injection  40 mg Subcutaneous Q24H  . fluticasone  2 spray Each Nare Daily  . furosemide  40 mg Intravenous BID  . influenza vaccine adjuvanted  0.5 mL Intramuscular Tomorrow-1000  . iron polysaccharides  150 mg Oral BID  . lidocaine  1 patch Transdermal Q24H  . mouth rinse  15 mL Mouth Rinse BID  .  metoprolol succinate  12.5 mg Oral q morning - 10a  . mometasone-formoterol  2 puff Inhalation BID  . pantoprazole  40 mg Oral BID  . predniSONE  40 mg Oral Q breakfast  . sodium chloride flush  3 mL Intravenous Q12H  . vitamin B-12  3,000 mcg Oral Daily  . ascorbic acid  500 mg Oral Daily   Continuous Infusions: . sodium chloride 250 mL (09/04/19 2148)  . azithromycin 500 mg (09/04/19 1709)  . ceFEPime (MAXIPIME) IV 2 g (09/05/19 1233)    Assessment & Plan:   Active Problems:   Respiratory failure with hypoxia (HCC)  Acute hypoxic respiratory failure./COPD exacerbation/CAP Although initial COVID-19 test is negative. Patient is high risk due to sick contact as her husband recently diagnosed with COVID-19 pneumonia. (he is inpatient).  Chest x-ray with bilateral infiltrate consistent with COVID-19 pneumonia. Patient is high risk because of underlying COPD. CTA no PE, b/l ground glass infilatrate highly suggestive of  viral pna. Third Covid test negative, but treating for presumed covid due to high risk , still on airborne precaution and started on  Remdesvir  switched  Decadron 6 g IV daily to po Predenisone 40mg  daily with taper -Continue home inhalers. -Ceftriaxone and azithromycin for possible CAP.Marland Kitchen>but abx d/c'd? Was started azithromycin and cefepime, will continue BNP was elevated, and was volume overloaded, started on iv lasix with improvement CRP elevated Procalcitonin<0.10 D/c'd  theophyline as pharmacy stated pt not taking at home. Pulmonary input was appreciated..> will d/c contact precautions as her Covid antibody and other testings were negative.  Will start on neb treatments. Palliative care consult will be placed   CAD  Recent right and left cardiac catheterization.Patient fraction of 45 to 50%,and 100% stenosis of proximal circumflex, 99% stenosis of RCA.She was high risk for PCI and was advised to continue with dual antiplatelets. Mildly elevated troponin-most likely demand ischemia.no cp. ontinue on Plavix, aspirin, and Toprol Decreased toprol xl to 12.5 mg qd for softer bp Holding losartan due to elevated K   Acute on chronic diastolic heart failure  Becoming more euvolemic.   BNP decreasing  We will decrease Lasix from 40 IV twice daily to 20 mg IV twice daily  Monitor labs and volume status  Monitor I's and O's     AKI. Mildly elevated  On ivf since had cta. -Improved D/c IVF -Avoid nephrotoxic.- hold losartan for now -given kayexalate 15gm since K 5.0.Marland KitchenMarland Kitchen>now 4.4   Pancytopenia- likely due to infection. Improving Continue to monitor.    DVT prophylaxis:Lovenox Code Status:Full code Family Communication:No family at bedside. Disposition Plan:likely be here 2 [place] stays until medically more stable.       LOS: 5 days   Time spent: 45 minutes with more than 50% on [place], MD [facility] Pager [phone]  If 7PM-7AM,  please contact night-coverage [email or web address] Password [facility] 09/05/2019, 3:40 PM Patient ID: Catherine Landry, female   DOB: September 19, 1936, 83 y.o.   MRN: [id number] Patient ID: Catherine Landry, female   DOB: 01/12/1936, 83 y.o.   MRN: [id number]

## 2019-09-05 NOTE — Plan of Care (Signed)
  Problem: Clinical Measurements: Goal: Ability to maintain clinical measurements within normal limits will improve Outcome: Progressing Goal: Diagnostic test results will improve Outcome: Progressing   Problem: Activity: Goal: Risk for activity intolerance will decrease Outcome: Progressing   Problem: Safety: Goal: Ability to remain free from injury will improve Outcome: Progressing   Problem: Skin Integrity: Goal: Risk for impaired skin integrity will decrease Outcome: Progressing

## 2019-09-05 NOTE — Plan of Care (Signed)
  Problem: Clinical Measurements: Goal: Ability to maintain clinical measurements within normal limits will improve Outcome: Progressing   Problem: Elimination: Goal: Will not experience complications related to bowel motility Outcome: Progressing   Problem: Pain Managment: Goal: General experience of comfort will improve Outcome: Progressing   Problem: Safety: Goal: Ability to remain free from injury will improve Outcome: Progressing   Problem: Clinical Measurements: Goal: Respiratory complications will improve Outcome: Not Progressing Note: Patient continues to be DOE and requiring 6L of oxygen.

## 2019-09-06 ENCOUNTER — Other Ambulatory Visit: Payer: Medicare Other | Admitting: Urology

## 2019-09-06 ENCOUNTER — Encounter: Payer: Self-pay | Admitting: Internal Medicine

## 2019-09-06 DIAGNOSIS — Z515 Encounter for palliative care: Secondary | ICD-10-CM

## 2019-09-06 DIAGNOSIS — Z7189 Other specified counseling: Secondary | ICD-10-CM

## 2019-09-06 LAB — CBC
HCT: 32.9 % — ABNORMAL LOW (ref 36.0–46.0)
Hemoglobin: 10.5 g/dL — ABNORMAL LOW (ref 12.0–15.0)
MCH: 29.9 pg (ref 26.0–34.0)
MCHC: 31.9 g/dL (ref 30.0–36.0)
MCV: 93.7 fL (ref 80.0–100.0)
Platelets: 198 10*3/uL (ref 150–400)
RBC: 3.51 MIL/uL — ABNORMAL LOW (ref 3.87–5.11)
RDW: 14.6 % (ref 11.5–15.5)
WBC: 5.9 10*3/uL (ref 4.0–10.5)
nRBC: 0 % (ref 0.0–0.2)

## 2019-09-06 LAB — BASIC METABOLIC PANEL
Anion gap: 9 (ref 5–15)
BUN: 36 mg/dL — ABNORMAL HIGH (ref 8–23)
CO2: 37 mmol/L — ABNORMAL HIGH (ref 22–32)
Calcium: 7.6 mg/dL — ABNORMAL LOW (ref 8.9–10.3)
Chloride: 90 mmol/L — ABNORMAL LOW (ref 98–111)
Creatinine, Ser: 1.26 mg/dL — ABNORMAL HIGH (ref 0.44–1.00)
GFR calc Af Amer: 46 mL/min — ABNORMAL LOW (ref >60)
GFR calc non Af Amer: 40 mL/min — ABNORMAL LOW (ref >60)
Glucose, Bld: 129 mg/dL — ABNORMAL HIGH (ref 70–99)
Potassium: 3.5 mmol/L (ref 3.5–5.1)
Sodium: 136 mmol/L (ref 135–145)

## 2019-09-06 LAB — GLUCOSE, CAPILLARY: Glucose-Capillary: 138 mg/dL — ABNORMAL HIGH (ref 70–99)

## 2019-09-06 LAB — BRAIN NATRIURETIC PEPTIDE: B Natriuretic Peptide: 381 pg/mL — ABNORMAL HIGH (ref 0.0–100.0)

## 2019-09-06 MED ORDER — IPRATROPIUM-ALBUTEROL 0.5-2.5 (3) MG/3ML IN SOLN
3.0000 mL | Freq: Four times a day (QID) | RESPIRATORY_TRACT | Status: DC
  Administered 2019-09-06 (×2): 3 mL via RESPIRATORY_TRACT
  Filled 2019-09-06 (×3): qty 3

## 2019-09-06 NOTE — Evaluation (Signed)
Physical Therapy Evaluation Patient Details Name: Catherine Landry MRN: [id number] DOB: 11/23/1935 Today's Date: 09/06/2019   History of Present Illness  83 yo female admitted after presenting to ED with complaints of weakness and SOB, on arrival to ED tachycardic, tachypneic and saturating in 60s on 2L, PMH includes COPD, CHF, GERD, renal cancer s/p left nephrectomy. recently admitted few months ago for hypoxia  Clinical Impression  Pt is 83 yo female admitted for above. Pt pleasant having just finished breathing treatment and eager for PT. Pt reporting she has been wanting to get out of bed. Pt lives with spouse and typically is his caregiver however spouse currently admitted at [facility] for covid-19. Pt educated on supine LE therex and encouraged to perform on her own throughout day to improve strength.  Pt required min guard to min A for all functional mobility. Pt reporting fatigue t/o and at end of session. SpO2 monitored t/o session with O2 sats dropping to mid 80s after transferring to recliner from bed. O2 sats increased with 1-3 min rest and cuing for PLB. Pt ambulated a few feet forwards and backwards requiring min A for RW mgt and balance. Pt presents with decreased strength (grossly 3/5), ROM, balance, power, and activity tolerance limiting functional mobility. Pt would benefit from skilled acute PT to improve deficits and recommendation for STR following discharge to further improve deficits, improve independence and decrease fall risk.     Follow Up Recommendations SNF;Supervision for mobility/OOB    Equipment Recommendations  Rolling walker with 5" wheels    Recommendations for Other Services       Precautions / Restrictions Precautions Precautions: Fall Precaution Comments: watch O2 Restrictions Weight Bearing Restrictions: No      Mobility  Bed Mobility Overal bed mobility: Needs Assistance Bed Mobility: Supine to Sit     Supine to sit: Min guard;HOB elevated      General bed mobility comments: min guard for safety, no physical assist to get EOB, pt utilizing bed rails and increased time  Transfers Overall transfer level: Needs assistance Equipment used: Rolling walker (2 wheeled) Transfers: Sit to/from [facility] Sit to Stand: Min assist Stand pivot transfers: Min assist       General transfer comment: light min A to power to full standing, cuing for hand placement with RW with good carryover with other transfers, pt stands with increased trunk flexion and increased effort, min A mostly for RW mgt for stand pivot to BSC, x1 STS from bed, x2 recliner, x1 BSC  Ambulation/Gait Ambulation/Gait assistance: Min guard Gait Distance (Feet): 5 Feet Assistive device: Rolling walker (2 wheeled) Gait Pattern/deviations: Step-through pattern;Decreased stride length;Trunk flexed Gait velocity: decreased   General Gait Details: pt ambulated a few feet forwards and backwards from recliner, pt reporting increased fatigue limiting amount of ambulation this session, increased reliance on UE support from [employer] Mobility    Modified Rankin (Stroke Patients Only)       Balance Overall balance assessment: Needs assistance Sitting-balance support: Feet supported;Single extremity supported Sitting balance-Leahy Scale: Fair Sitting balance - Comments: steady sitting EOB preferring at least single UE support   Standing balance support: Bilateral upper extremity supported;During functional activity Standing balance-Leahy Scale: Poor Standing balance comment: reliant on UE support from RW for ambulation and transfers  Pertinent Vitals/Pain Pain Assessment: Faces Faces Pain Scale: Hurts a little bit Pain Location: soreness from being in bed Pain Intervention(s): Monitored during session;Limited activity within patient's tolerance;Repositioned    Home Living  Family/patient expects to be discharged to:: Private residence Living Arrangements: Spouse/significant other Available Help at Discharge: Family;Available 24 hours/day;Other (Comment)(husband currently hospitalized with covid-19) Type of Home: House Home Access: Stairs to enter Entrance Stairs-Rails: Right Entrance Stairs-Number of Steps: 1 Home Layout: One level Home Equipment: Other (comment);Walker - 4 wheels;Cane - quad;Cane - single point;Hand held shower head;Tub bench      Prior Function Level of Independence: Independent         Comments: ind with ADLs, ambulates without AD however if feeling really bad pt will amb with husbands rollator, primary caregiver for spouse     Hand Dominance        Extremity/Trunk Assessment   Upper Extremity Assessment Upper Extremity Assessment: Generalized weakness(strength grossly 3/5)    Lower Extremity Assessment Lower Extremity Assessment: Generalized weakness(strength grossly 3/5)       Communication   Communication: No difficulties  Cognition Arousal/Alertness: Awake/alert Behavior During Therapy: WFL for tasks assessed/performed Overall Cognitive Status: Within Functional Limits for tasks assessed                                        General Comments General comments (skin integrity, edema, etc.): pt reporting having skin breakdown on buttocks and vagina, nursing assistant notified    Exercises Total Joint Exercises Ankle Circles/Pumps: AROM;Both;10 reps Heel Slides: AROM;Both;10 reps Hip ABduction/ADduction: AROM;Both;10 reps Straight Leg Raises: AROM;Both;10 reps Marching in Standing: AROM;Both;10 reps;Standing   Assessment/Plan    PT Assessment Patient needs continued PT services  PT Problem List Decreased strength;Decreased mobility;Decreased range of motion;Decreased activity tolerance;Decreased balance;Decreased knowledge of use of DME;Cardiopulmonary status limiting activity       PT  Treatment Interventions DME instruction;Therapeutic exercise;Gait training;Balance training;Stair training;Neuromuscular re-education;Functional mobility training;Therapeutic activities;Patient/family education    PT Goals (Current goals can be found in the Care Plan section)  Acute Rehab PT Goals Patient Stated Goal: get stronger PT Goal Formulation: With patient Time For Goal Achievement: 09/06/2019 Potential to Achieve Goals: Fair    Frequency Min 2X/week   Barriers to discharge Decreased caregiver support      Co-evaluation               AM-PAC PT "6 Clicks" Mobility  Outcome Measure Help needed turning from your back to your side while in a flat bed without using bedrails?: A Little Help needed moving from lying on your back to sitting on the side of a flat bed without using bedrails?: A Little Help needed moving to and from a bed to a chair (including a wheelchair)?: A Little Help needed standing up from a chair using your arms (e.g., wheelchair or bedside chair)?: A Little Help needed to walk in hospital room?: A Little Help needed climbing 3-5 steps with a railing? : A Lot 6 Click Score: 17    End of Session Equipment Utilized During Treatment: Gait belt Activity Tolerance: Patient limited by fatigue Patient left: in chair;with call bell/phone within reach;with chair alarm set Nurse Communication: Mobility status PT Visit Diagnosis: Muscle weakness (generalized) (M62.81);Difficulty in walking, not elsewhere classified (R26.2)    Time: [id number] PT Time Calculation (min) (ACUTE ONLY): 35 min   Charges:   PT Evaluation $PT Eval Moderate  Complexity: 1 Mod PT Treatments $Therapeutic Exercise: 8-22 mins $Therapeutic Activity: 8-22 mins        Zachary George PT, DPT 3:01 PM,09/06/19 [phone]   Catherine Landry 09/06/2019, 2:56 PM

## 2019-09-06 NOTE — Progress Notes (Signed)
Pt had a severity score of 2 on the RT protocol assessment scale. She takes nebulizer treatments at home Q6 and wants to change the nebulizer treatments to Q6.

## 2019-09-06 NOTE — Progress Notes (Signed)
PROGRESS NOTE    Catherine Landry  [id number] DOB: 1936-08-06 DOA: 08/31/2019 PCP: Tracie Harrier, MD      Assessment & Plan:   Active Problems:   Respiratory failure with hypoxia (HCC)   Acute on chronic hypoxic & hyercarbic respiratory failure: likely secondary to COVID19 vs bacterial pneumonia. Although COVID-19 test is negative. Patient is high risk due to sick contact as her husband recently diagnosed with COVID-19 pneumonia & currently inpatient. Continue on steroids, vitamin c. Continue on airborne & contact precautions. Continue on azithromycin & cefepime for possible CAP (possible gram positive or gram neg)  Pneumonia: secondary to COVID19 vs bacterial. Possibly gram positive or gram neg. Continue on IV azithromycin, cefepime, steroids, & bronchodilators  COPD exacerbation: end stage. Continue on bronchodilators. Continue on supplemental oxygen and wean back to baseline of 2L [place]. Pulmon recs apprec  AKI: baseline Cr is unknown. Cr is trending up today likely secondary to lasix use. Will continue to monitor   CAD: Recent right and left cardiac catheterization, EF of 45- 50%,and 100% stenosis of proximal circumflex, 99% stenosis of RCA.High risk for PCI and was advised to continue with dual antiplatelets. Mildly elevated troponin likely secondary to demand ischemia. No chest pain. Continue on decreased dose of beta blocker. Continue on tele.  Acute on chronic diastolic CHF: continue on IV lasix. Strict I/Os.   DVT prophylaxis: lovenox Code Status:  DNR Disposition Plan: when stable for d/c will d/c w/ palliative care outpatient f/u     Consultants:   Pulmon  Palliative care   Procedures:   Antimicrobials: azithromycin, cefepime   Subjective: Pt c/o weakness. Pt is worried about her husband who has Harvey.   Objective: Vitals:   09/05/19 2036 09/06/19 0017 09/06/19 0358 09/06/19 0550  BP:    (!) 110/58  Pulse:    82  Resp:    18  Temp:    97.8  F (36.6 C)  TempSrc:    Oral  SpO2: 98% 97% 95% 95%  Weight:    57.7 kg  Height:        Intake/Output Summary (Last 24 hours) at 09/06/2019 0735 Last data filed at 09/06/2019 0555 Gross per 24 hour  Intake --  Output 2600 ml  Net -2600 ml   Filed Weights   09/04/19 0426 09/05/19 0516 09/06/19 0550  Weight: 58.9 kg 57.5 kg 57.7 kg    Examination:  General exam: Appears calm and comfortable  Respiratory system: diminished breath sounds b/l especially at the bases. No rhonchi Cardiovascular system: S1 & S2 normal. No rubs, gallops Gastrointestinal system: Abdomen is nondistended, soft and nontender. Normal bowel sounds heard. CNS: alert and oriented. Moves all 4 extremities Psychiatry: Judgement and insight appear normal. Flat mood and affect      Data Reviewed: I have personally reviewed following labs and imaging studies  CBC: Recent Labs  Lab 08/31/19 1512 09/01/19 0439 09/03/19 0457 09/06/19 0455  WBC 7.4 3.2* 6.8 5.9  NEUTROABS 4.6  --   --   --   HGB 11.0* 10.3* 9.4* 10.5*  HCT 33.3* 32.8* 30.4* 32.9*  MCV 92.2 96.8 97.7 93.7  PLT 158 115* 145* [id number]   Basic Metabolic Panel: Recent Labs  Lab 09/01/19 0439 09/01/19 1622 09/03/19 0457 09/04/19 0526 09/05/19 0452 09/06/19 0455  NA 137  --  138 141 138 136  K 5.5* 4.8 5.0 4.4 4.1 3.5  CL 99  --  100 100 94* 90*  CO2 33*  --  32  34* 34* 37*  GLUCOSE 233*  --  124* 91 95 129*  BUN 22  --  31* 27* 30* 36*  CREATININE 1.40*  --  0.99 0.99 0.97  1.02* 1.26*  CALCIUM 8.7*  --  8.5* 8.4* 7.7* 7.6*   GFR: Estimated Creatinine Clearance: 27.7 mL/min (A) (by C-G formula based on SCr of 1.26 mg/dL (H)). Liver Function Tests: Recent Labs  Lab 08/31/19 1512  AST 22  ALT 11  ALKPHOS 50  BILITOT 0.7  PROT 7.9  ALBUMIN 4.0   No results for input(s): LIPASE, AMYLASE in the last 168 hours. No results for input(s): AMMONIA in the last 168 hours. Coagulation Profile: No results for input(s): INR, PROTIME in  the last 168 hours. Cardiac Enzymes: No results for input(s): CKTOTAL, CKMB, CKMBINDEX, TROPONINI in the last 168 hours. BNP (last 3 results) No results for input(s): PROBNP in the last 8760 hours. HbA1C: No results for input(s): HGBA1C in the last 72 hours. CBG: Recent Labs  Lab 09/03/19 0832 09/03/19 2036 09/04/19 0817 09/04/19 1126 09/05/19 0839  GLUCAP 75 168* 82 143* 75   Lipid Profile: No results for input(s): CHOL, HDL, LDLCALC, TRIG, CHOLHDL, LDLDIRECT in the last 72 hours. Thyroid Function Tests: No results for input(s): TSH, T4TOTAL, FREET4, T3FREE, THYROIDAB in the last 72 hours. Anemia Panel: No results for input(s): VITAMINB12, FOLATE, FERRITIN, TIBC, IRON, RETICCTPCT in the last 72 hours. Sepsis Labs: Recent Labs  Lab 08/31/19 1512  PROCALCITON <0.10    Recent Results (from the past 240 hour(s))  Respiratory Panel by RT PCR (Flu A&B, Covid) - Nasopharyngeal Swab     Status: None   Collection Time: 08/31/19  4:36 PM   Specimen: Nasopharyngeal Swab  Result Value Ref Range Status   SARS Coronavirus 2 by RT PCR NEGATIVE NEGATIVE Final    Comment: (NOTE) SARS-CoV-2 target nucleic acids are NOT DETECTED. The SARS-CoV-2 RNA is generally detectable in upper respiratoy specimens during the acute phase of infection. The lowest concentration of SARS-CoV-2 viral copies this assay can detect is 131 copies/mL. A negative result does not preclude SARS-Cov-2 infection and should not be used as the sole basis for treatment or other patient management decisions. A negative result may occur with  improper specimen collection/handling, submission of specimen other than nasopharyngeal swab, presence of viral mutation(s) within the areas targeted by this assay, and inadequate number of viral copies (<131 copies/mL). A negative result must be combined with clinical observations, patient history, and epidemiological information. The expected result is Negative. Fact Sheet for  Patients:  PinkCheek.be Fact Sheet for Healthcare Providers:  GravelBags.it This test is not yet ap proved or cleared by the [place] FDA and  has been authorized for detection and/or diagnosis of SARS-CoV-2 by FDA under an Emergency Use Authorization (EUA). This EUA will remain  in effect (meaning this test can be used) for the duration of the COVID-19 declaration under Section 564(b)(1) of the Act, 21 U.S.C. section 360bbb-3(b)(1), unless the authorization is terminated or revoked sooner.    Influenza A by PCR NEGATIVE NEGATIVE Final   Influenza B by PCR NEGATIVE NEGATIVE Final    Comment: (NOTE) The Xpert Xpress SARS-CoV-2/FLU/RSV assay is intended as an aid in  the diagnosis of influenza from Nasopharyngeal swab specimens and  should not be used as a sole basis for treatment. Nasal washings and  aspirates are unacceptable for Xpert Xpress SARS-CoV-2/FLU/RSV  testing. Fact Sheet for Patients: PinkCheek.be Fact Sheet for Healthcare Providers: GravelBags.it This test  is not yet approved or cleared by the [place] and  has been authorized for detection and/or diagnosis of SARS-CoV-2 by  FDA under an Emergency Use Authorization (EUA). This EUA will remain  in effect (meaning this test can be used) for the duration of the  Covid-19 declaration under Section 564(b)(1) of the Act, 21  U.S.C. section 360bbb-3(b)(1), unless the authorization is  terminated or revoked. Performed at [facility], [place], [place] [address]   SARS CORONAVIRUS 2 (TAT 6-24 HRS) Nasopharyngeal Nasopharyngeal Swab     Status: None   Collection Time: 08/31/19  6:52 PM   Specimen: Nasopharyngeal Swab  Result Value Ref Range Status   SARS Coronavirus 2 NEGATIVE NEGATIVE Final    Comment: (NOTE) SARS-CoV-2 target nucleic acids are NOT DETECTED. The  SARS-CoV-2 RNA is generally detectable in upper and lower respiratory specimens during the acute phase of infection. Negative results do not preclude SARS-CoV-2 infection, do not rule out co-infections with other pathogens, and should not be used as the sole basis for treatment or other patient management decisions. Negative results must be combined with clinical observations, patient history, and epidemiological information. The expected result is Negative. Fact Sheet for Patients: SugarRoll.be Fact Sheet for Healthcare Providers: https://www.woods-mathews.com/ This test is not yet approved or cleared by the [place] FDA and  has been authorized for detection and/or diagnosis of SARS-CoV-2 by FDA under an Emergency Use Authorization (EUA). This EUA will remain  in effect (meaning this test can be used) for the duration of the COVID-19 declaration under Section 56 4(b)(1) of the Act, 21 U.S.C. section 360bbb-3(b)(1), unless the authorization is terminated or revoked sooner. Performed at [facility], [place] [address], [place], [place] [address]   MRSA PCR Screening     Status: None   Collection Time: 09/04/19  4:53 PM   Specimen: Nasopharyngeal  Result Value Ref Range Status   MRSA by PCR NEGATIVE NEGATIVE Final    Comment:        The GeneXpert MRSA Assay (FDA approved for NASAL specimens only), is one component of a comprehensive MRSA colonization surveillance program. It is not intended to diagnose MRSA infection nor to guide or monitor treatment for MRSA infections. Performed at [facility], [address], [place], [place] [address]          Radiology Studies: DG Chest 1 View  Result Date: 09/04/2019 CLINICAL DATA:  Shortness of breath EXAM: CHEST  1 VIEW COMPARISON:  08/31/2019 FINDINGS: The heart size and mediastinal contours are stable. Minimal hazy opacity within both lungs most notable in the right  upper lobe and left lower lobe without significant interval progression from prior. No pleural effusion or pneumothorax. IMPRESSION: Minimal hazy opacities without significant interval progression from prior. Electronically Signed   By: Davina Poke M.D.   On: 09/04/2019 16:39        Scheduled Meds: . aspirin EC  81 mg Oral q morning - 10a  . busPIRone  7.5 mg Oral BID  . calcium-vitamin D  2 tablet Oral Daily  . clopidogrel  75 mg Oral Daily  . enoxaparin (LOVENOX) injection  40 mg Subcutaneous Q24H  . fluticasone  2 spray Each Nare Daily  . furosemide  20 mg Intravenous BID  . influenza vaccine adjuvanted  0.5 mL Intramuscular Tomorrow-1000  . ipratropium-albuterol  3 mL Nebulization Q4H  . iron polysaccharides  150 mg Oral BID  . lidocaine  1 patch Transdermal  Q24H  . mouth rinse  15 mL Mouth Rinse BID  . metoprolol succinate  12.5 mg Oral q morning - 10a  . mometasone-formoterol  2 puff Inhalation BID  . pantoprazole  40 mg Oral BID  . predniSONE  40 mg Oral Q breakfast  . sodium chloride flush  3 mL Intravenous Q12H  . vitamin B-12  3,000 mcg Oral Daily  . ascorbic acid  500 mg Oral Daily   Continuous Infusions: . sodium chloride 250 mL (09/05/19 2134)  . azithromycin Stopped (09/05/19 1744)  . ceFEPime (MAXIPIME) IV 2 g (09/05/19 2140)     LOS: 6 days    Time spent: 35 mins    Wyvonnia Dusky, MD [facility] Pager [phone]  If 7PM-7AM, please contact night-coverage [email or web address] Password [facility] 09/06/2019, 7:35 AM

## 2019-09-06 NOTE — Consult Note (Signed)
Consultation Note Date: 09/06/2019   Patient Name: Catherine Landry  DOB: 11-04-1935  MRN: [id number]  Age / Sex: 83 y.o., female  PCP: Catherine Harrier, MD Referring Physician: Wyvonnia Dusky, MD  Reason for Consultation: Establishing goals of care and Psychosocial/spiritual support  HPI/Patient Profile: 83 y.o. female  with past medical history of COPD on 2 L home oxygen, quit smoking 19 years ago, bladder cancer, left kidney cancer, heart failure, GERD, hypertension, arthritis, renal insufficiency, Raynaud's disease, gout admitted on 08/31/2019 with acute hypoxic respiratory failure, possible COVID-19 pneumonia, although tested negative.   Clinical Assessment and Goals of Care: Catherine Landry is resting quietly in bed.  She greets me making and mostly keeping eye contact.  She is alert and oriented, calm and cooperative.  She is able to make her basic needs known.  There is no family at bedside at this time.  I ask if she is feeling better, and she tells me that she is not.  We talked about the treatment plan in detail including PT evaluation.   We talked about her healthcare power of attorney, see below.   We talked about her CODE STATUS, see below.  As we are talking her brother/healthcare surrogate because on the phone.  We talk about her treatment plan and discharge options.  We also talked about Catherine Landry spouse, Catherine Landry, who is also hospitalized at this time.  This discussion is noted in Catherine Landry chart.  As we are talking becomes clear that Catherine Landry is fatiguing.  No further questions or concerns at this time. Call to brother/HC POA, voicemail message left.  PMT to continue to follow.  HCPOA  HCPOA -brother, Catherine Landry.  Spouse has memory loss.   SUMMARY OF RECOMMENDATIONS   Agreeable to rehab if qualified, preference [facility] Change CODE STATUS to allow a natural  death (DNR)  Code Status/Advance Care Planning:  DNR -Catherine Landry states that Landry she and her husband have advanced directive stating no life support, no tube feeding.  Orders adjusted.  Symptom Management:   Per hospitalist, no additional needs at this time.  Palliative Prophylaxis:   Frequent Pain Assessment, Palliative Wound Care and Turn Reposition  Additional Recommendations (Limitations, Scope, Preferences):  No Artificial Feeding and Treat the treatable but no CPR, no intubation  Psycho-social/Spiritual:   Desire for further Chaplaincy support:no  Additional Recommendations: Caregiving  Support/Resources and Education on Hospice  Prognosis:   Unable to determine, based on outcomes.  COPD is notoriously difficult to prognosticate.  Catherine Landry would clearly benefit from outpatient palliative services to follow, and she is agreeable.  No provider choice.  Discharge Planning: To be determined, based on outcomes and recommendations.  Agreeable to rehab at this point.      Primary Diagnoses: Present on Admission: . Respiratory failure with hypoxia ([place])   I have reviewed the medical record, interviewed the patient and family, and examined the patient. The following aspects are pertinent.  Past Medical History:  Diagnosis Date  . Arthritis   .  Asthma   . Bladder cancer ([place]) 2015  . Cancer [facility] 2001   kidney left   . CHF (congestive heart failure) ([place])   . COPD (chronic obstructive pulmonary disease) ([place])   . GERD (gastroesophageal reflux disease)   . Gout   . History of bladder cancer   . History of kidney cancer   . Hypertension   . Raynaud's disease   . Renal insufficiency    Social History   Socioeconomic History  . Marital status: Married    Spouse name: Not on file  . Number of children: Not on file  . Years of education: Not on file  . Highest education level: Not on file  Occupational History  . Not on file  Tobacco Use  . Smoking  status: Former Smoker    Packs/day: 1.50    Years: 40.00    Pack years: 60.00    Quit date: 12/09/1999    Years since quitting: 19.7  . Smokeless tobacco: Never Used  Substance and Sexual Activity  . Alcohol use: Yes    Comment: Socally   . Drug use: No  . Sexual activity: Yes    Birth control/protection: Post-menopausal  Other Topics Concern  . Not on file  Social History Narrative  . Not on file   Social Determinants of Health   Financial Resource Strain:   . Difficulty of Paying Living Expenses: Not on file  Food Insecurity:   . Worried About Charity fundraiser in the Last Year: Not on file  . Ran Out of Food in the Last Year: Not on file  Transportation Needs:   . Lack of Transportation (Medical): Not on file  . Lack of Transportation (Non-Medical): Not on file  Physical Activity:   . Days of Exercise per Week: Not on file  . Minutes of Exercise per Session: Not on file  Stress:   . Feeling of Stress : Not on file  Social Connections:   . Frequency of Communication with Friends and Family: Not on file  . Frequency of Social Gatherings with Friends and Family: Not on file  . Attends Religious Services: Not on file  . Active Member of Clubs or Organizations: Not on file  . Attends Archivist Meetings: Not on file  . Marital Status: Not on file   Family History  Problem Relation Age of Onset  . Breast cancer Mother 75  . Stroke Mother   . Breast cancer Other 60  . Heart disease Father    Scheduled Meds: . aspirin EC  81 mg Oral q morning - 10a  . busPIRone  7.5 mg Oral BID  . calcium-vitamin D  2 tablet Oral Daily  . clopidogrel  75 mg Oral Daily  . enoxaparin (LOVENOX) injection  40 mg Subcutaneous Q24H  . fluticasone  2 spray Each Nare Daily  . furosemide  20 mg Intravenous BID  . influenza vaccine adjuvanted  0.5 mL Intramuscular Tomorrow-1000  . ipratropium-albuterol  3 mL Nebulization Q6H  . iron polysaccharides  150 mg Oral BID  . lidocaine   1 patch Transdermal Q24H  . mouth rinse  15 mL Mouth Rinse BID  . metoprolol succinate  12.5 mg Oral q morning - 10a  . mometasone-formoterol  2 puff Inhalation BID  . pantoprazole  40 mg Oral BID  . predniSONE  40 mg Oral Q breakfast  . sodium chloride flush  3 mL Intravenous Q12H  . vitamin B-12  3,000 mcg Oral  Daily  . ascorbic acid  500 mg Oral Daily   Continuous Infusions: . sodium chloride 250 mL (09/05/19 2134)  . azithromycin Stopped (09/05/19 1744)  . ceFEPime (MAXIPIME) IV 2 g (09/06/19 0959)   PRN Meds:.sodium chloride, acetaminophen **OR** acetaminophen, Ipratropium-Albuterol, nitroGLYCERIN, ondansetron **OR** ondansetron (ZOFRAN) IV, polyethylene glycol Medications Prior to Admission:  Prior to Admission medications   Medication Sig Start Date End Date Taking? Authorizing Provider  ascorbic acid (VITAMIN C) 500 MG tablet Take 1 tablet by mouth daily.   Yes [provider]  busPIRone (BUSPAR) 7.5 MG tablet Take 7.5 mg by mouth 2 (two) times daily. 08/08/19  Yes [provider]  Calcium Carbonate-Vit D-Min (CALCIUM 600+D PLUS MINERALS) 600-400 MG-UNIT TABS Take 2 tablets by mouth daily.   Yes [provider]  clopidogrel (PLAVIX) 75 MG tablet Take 1 tablet (75 mg total) by mouth daily. 06/21/19 06/20/20 Yes FathJavier Docker, MD  furosemide (LASIX) 20 MG tablet Take 20 mg by mouth daily. 07/18/19  Yes [provider]  iron polysaccharides (NIFEREX) 150 MG capsule Take 150 mg by mouth 2 (two) times daily.    Yes [provider]  levalbuterol (XOPENEX) 0.63 MG/3ML nebulizer solution INHALE THE CONTENTS OF 1 VIAL IN NEBULIZER EVERY 6 HRS AS NEEDED FOR WHEEZING OR SHORTNESS OF BREATH 07/12/19  Yes Kasa, Maretta Bees, MD  losartan (COZAAR) 50 MG tablet Take 50 mg by mouth every morning.   Yes [provider]  metoprolol succinate (TOPROL-XL) 25 MG 24 hr tablet Take 25 mg by mouth every morning.   Yes [provider]  Multiple  Vitamin (MULTIVITAMIN WITH MINERALS) TABS Take 1 tablet by mouth every morning.   Yes [provider]  pantoprazole (PROTONIX) 40 MG tablet Take 40 mg by mouth 2 (two) times daily.   Yes [provider]  theophylline (THEODUR) 300 MG 12 hr tablet Take 300 mg by mouth daily. 08/08/19  Yes [provider]  vitamin B-12 (CYANOCOBALAMIN) 1000 MCG tablet Take 3,000 mcg by mouth daily.   Yes [provider]  acetaminophen (TYLENOL) 650 MG CR tablet Take 650 mg by mouth every 8 (eight) hours as needed for pain.    [provider]  aspirin EC 81 MG tablet Take 81 mg by mouth every morning.    [provider]  COMBIVENT RESPIMAT 20-100 MCG/ACT AERS respimat Inhale 1 puff into the lungs 4 (four) times daily as needed for wheezing or shortness of breath.     [provider]  fluticasone (FLONASE) 50 MCG/ACT nasal spray Place 2 sprays into Landry nostrils daily.    [provider]  Fluticasone-Salmeterol (ADVAIR) 250-50 MCG/DOSE AEPB Inhale 1 puff into the lungs 2 (two) times daily.    [provider]  nitroGLYCERIN (NITROSTAT) 0.4 MG SL tablet Place 0.4 mg under the tongue every 5 (five) minutes as needed for chest pain.  08/03/19   [provider]  polyethylene glycol powder (GLYCOLAX/MIRALAX) powder Take 17 g by mouth daily as needed for moderate constipation. Mix with 4 to 8 ounces of fluid. 04/10/16   [provider]  theophylline (THEO-24) 300 MG 24 hr capsule Take 1 capsule (300 mg total) by mouth every morning. Patient not taking: Reported on 08/31/2019 10/12/15   Bettey Costa, MD   Allergies  Allergen Reactions  . Nitrofurantoin Diarrhea and Other (See Comments)    Headache   Review of Systems  Unable to perform ROS: Age    Physical Exam Vitals and nursing  note reviewed.  Constitutional:      General: She is not in acute distress.    Appearance: She is normal weight. She is ill-appearing.      Comments: Makes and mostly keeps eye contact, appears chronically ill and frail  HENT:     Head: Normocephalic and atraumatic.  Cardiovascular:     Rate and Rhythm: Normal rate.  Pulmonary:     Effort: Pulmonary effort is normal. No tachypnea.  Abdominal:     Palpations: Abdomen is soft.  Skin:    General: Skin is warm and dry.  Neurological:     Mental Status: She is alert and oriented to person, place, and time.  Psychiatric:     Comments: Calm and cooperative     Vital Signs: BP (!) 105/56 (BP Location: Right Arm)   Pulse 90   Temp 97.9 F (36.6 C) (Oral)   Resp 19   Ht 5\' 2"  (1.575 m)   Wt 57.7 kg   SpO2 94%   BMI 23.27 kg/m  Pain Scale: 0-10   Pain Score: 0-No pain   SpO2: SpO2: 94 % O2 Device:SpO2: 94 % O2 Flow Rate: .O2 Flow Rate (L/min): 3 L/min  IO: Intake/output summary:   Intake/Output Summary (Last 24 hours) at 09/06/2019 1035 Last data filed at 09/06/2019 0555 Gross per 24 hour  Intake -  Output 2600 ml  Net -2600 ml    LBM: Last BM Date: 09/04/19 Baseline Weight: Weight: 54.9 kg Most recent weight: Weight: 57.7 kg     Palliative Assessment/Data:   Flowsheet Rows     Most Recent Value  Intake Tab  Referral Department  Hospitalist  Unit at Time of Referral  Cardiac/Telemetry Unit  Palliative Care Primary Diagnosis  Pulmonary  Date Notified  09/05/19  Palliative Care Type  New Palliative care  Reason for referral  Clarify Goals of Care  Date of Admission  08/31/19  Date first seen by Palliative Care  09/06/19  # of days Palliative referral response time  1 Day(s)  # of days IP prior to Palliative referral  5  Clinical Assessment  Palliative Performance Scale Score  40%  Pain Max last 24 hours  Not able to report  Pain Min Last 24 hours  Not able to report  Dyspnea Max Last 24 Hours  Not able to report  Dyspnea Min Last 24 hours  Not able to report  Psychosocial & Spiritual Assessment  Palliative Care Outcomes      Time In: 1010  Time Out: 1120 Time Total: 70 minutes Greater than 50%  of this time was spent counseling and coordinating care related to the above assessment and plan.  Signed by: Drue Novel, NP   Please contact Palliative Medicine Team phone at [phone] for questions and concerns.  For individual provider: See Shea Evans

## 2019-09-07 LAB — BASIC METABOLIC PANEL
Anion gap: 10 (ref 5–15)
BUN: 32 mg/dL — ABNORMAL HIGH (ref 8–23)
CO2: 36 mmol/L — ABNORMAL HIGH (ref 22–32)
Calcium: 7.7 mg/dL — ABNORMAL LOW (ref 8.9–10.3)
Chloride: 89 mmol/L — ABNORMAL LOW (ref 98–111)
Creatinine, Ser: 1.16 mg/dL — ABNORMAL HIGH (ref 0.44–1.00)
GFR calc Af Amer: 51 mL/min — ABNORMAL LOW (ref >60)
GFR calc non Af Amer: 44 mL/min — ABNORMAL LOW (ref >60)
Glucose, Bld: 97 mg/dL (ref 70–99)
Potassium: 3.9 mmol/L (ref 3.5–5.1)
Sodium: 135 mmol/L (ref 135–145)

## 2019-09-07 LAB — CBC
HCT: 31.4 % — ABNORMAL LOW (ref 36.0–46.0)
Hemoglobin: 10.7 g/dL — ABNORMAL LOW (ref 12.0–15.0)
MCH: 30.1 pg (ref 26.0–34.0)
MCHC: 34.1 g/dL (ref 30.0–36.0)
MCV: 88.2 fL (ref 80.0–100.0)
Platelets: 212 10*3/uL (ref 150–400)
RBC: 3.56 MIL/uL — ABNORMAL LOW (ref 3.87–5.11)
RDW: 14.7 % (ref 11.5–15.5)
WBC: 6 10*3/uL (ref 4.0–10.5)
nRBC: 0 % (ref 0.0–0.2)

## 2019-09-07 LAB — GLUCOSE, CAPILLARY: Glucose-Capillary: 84 mg/dL (ref 70–99)

## 2019-09-07 MED ORDER — IPRATROPIUM-ALBUTEROL 20-100 MCG/ACT IN AERS
1.0000 | INHALATION_SPRAY | Freq: Four times a day (QID) | RESPIRATORY_TRACT | Status: DC
  Administered 2019-09-07 – 2019-09-12 (×21): 1 via RESPIRATORY_TRACT
  Filled 2019-09-07: qty 4

## 2019-09-07 NOTE — Progress Notes (Signed)
  Report at start of shift states that patient was taken off Airborne/Contact precautions earlier, and no longer is considered an isolation room. Per MD note 12/16 AM and 12/15 PM - Patient's chest XR concerning for PNA consistent with Covid-19; this dx is further compounded by spouse's + Covid-19 status. This RN was able to make contact with Infectious Disease who confirmed that patient should remain on Airborne/Contact precautions until further notice. MD on-call was notified, and patient placed back in isolation.

## 2019-09-07 NOTE — TOC Initial Note (Signed)
Transition of Care [facility]) - Initial/Assessment Note    Patient Details  Name: Catherine Landry MRN: [id number] Date of Birth: 1935/12/28  Transition of Care [facility]) CM/SW Contact:    Candie Chroman, LCSW Phone Number: 09/07/2019, 4:29 PM  Clinical Narrative: CSW called patient in room to discuss PT recommendations. She is agreeable to SNF. [facility] preference [facility] because that is where her husband is discharging to today. Will keep the facility updated on her medical status. Patient's brother/HCPOA is aware as well. No further concerns. CSW encouraged patient and her brother to contact CSW as needed. CSW will continue to follow patient and her brother for support and facilitate discharge to SNF once medically stable.                 Expected Discharge Plan: Skilled Nursing Facility Barriers to Discharge: Continued Medical Work up   Patient Goals and CMS Choice        Expected Discharge Plan and Services Expected Discharge Plan: [place] Choice: [place] Living arrangements for the past 2 months: Single Family Home                                      Prior Living Arrangements/Services Living arrangements for the past 2 months: Single Family Home Lives with:: Spouse Patient language and need for interpreter reviewed:: Yes Do you feel safe going back to the place where you live?: Yes      Need for Family Participation in Patient Care: Yes (Comment) Care giver support system in place?: Yes (comment) Current home services: Home PT, Home RN Criminal Activity/Legal Involvement Pertinent to Current Situation/Hospitalization: No - Comment as needed  Activities of Daily Living Home Assistive Devices/Equipment: Oxygen, Nebulizer ADL Screening (condition at time of admission) Patient's cognitive ability adequate to safely complete daily activities?: Yes Is the patient deaf or have difficulty hearing?: No Does the  patient have difficulty seeing, even when wearing glasses/contacts?: No Does the patient have difficulty concentrating, remembering, or making decisions?: No Patient able to express need for assistance with ADLs?: Yes Does the patient have difficulty dressing or bathing?: No Independently performs ADLs?: No Bathing: Needs assistance Is this a change from baseline?: Pre-admission baseline Does the patient have difficulty walking or climbing stairs?: Yes Weakness of Legs: Both Weakness of Arms/Hands: None  Permission Sought/Granted Permission sought to share information with : Facility Sport and exercise psychologist, Family Supports Permission granted to share information with : Yes, Verbal Permission Granted  Share Information with NAME: Elicia Lamp  Permission granted to share info w AGENCY: SNF's  Permission granted to share info w Relationship: Brother/HCPOA  Permission granted to share info w Contact Information: [phone]  Emotional Assessment Appearance:: Appears stated age Attitude/Demeanor/Rapport: Engaged, Gracious Affect (typically observed): Accepting, Appropriate, Calm, Pleasant Orientation: : Oriented to Self, Oriented to Place, Oriented to  Time, Oriented to Situation Alcohol / Substance Use: Not Applicable Psych Involvement: No (comment)  Admission diagnosis:  COPD exacerbation (HCC) [J44.1] Respiratory failure with hypoxia (HCC) [J96.91] Acute respiratory failure with hypoxia (HCC) [J96.01] Multifocal pneumonia [J18.9] Patient Active Problem List   Diagnosis Date Noted  . Palliative care by specialist   . DNR (do not resuscitate) discussion   . Goals of care, counseling/discussion   . Multifocal pneumonia   . Essential hypertension   . AKI (acute kidney injury) ([place])   .  Respiratory failure with hypoxia ([place]) 08/31/2019  . Respiratory failure, acute ([place]) 04/26/2019  . Elevated troponin 04/26/2019  . Personal history of bladder cancer 03/16/2018  . Acute on  chronic respiratory failure with hypoxia ([place]) 11/23/2017  . UTI (lower urinary tract infection) 04/25/2016  . COPD exacerbation ([place]) 10/06/2015   PCP:  Tracie Harrier, MD Pharmacy:   [id number] - [place], [place] [address] [place] [address] Phone: [phone] Fax: [phone]  [id number] [place], [place] [address] [place] [address] Phone: [phone] Fax: [phone]     Social Determinants of Health (SDOH) Interventions    Readmission Risk Interventions Readmission Risk Prevention Plan 09/05/2019 04/30/2019  Transportation Screening - Complete  PCP or Specialist Appt within 5-7 Days - Complete  PCP or Specialist Appt within 3-5 Days Complete -  Home Care Screening - Complete  Medication Review (RN CM) - Complete  HRI or Home Care Consult Complete -  Social Work Consult for Recovery Care Planning/Counseling Complete -  Palliative Care Screening Not Applicable -  Some recent data might be hidden

## 2019-09-07 NOTE — Progress Notes (Signed)
PROGRESS NOTE    Catherine Landry  [id number] DOB: 27-May-1936 DOA: 08/31/2019 PCP: Tracie Harrier, MD      Assessment & Plan:   Active Problems:   Respiratory failure with hypoxia ([place])   Palliative care by specialist   DNR (do not resuscitate) discussion   Goals of care, counseling/discussion   Acute on chronic hypoxic & hyercarbic respiratory failure: improving slowing. Likely secondary to COVID19 vs bacterial pneumonia. Although COVID-19 test is negative. Patient is high risk due to sick contact as her husband recently diagnosed with COVID-19 pneumonia & currently inpatient. Continue on steroids, vitamin c. Continue on airborne & contact precautions. Continue on azithromycin & cefepime for possible CAP (possible gram positive or gram neg).  Pneumonia: secondary to COVID19 vs bacterial. Possibly gram positive or gram neg. Continue on IV azithromycin, cefepime, steroids, & bronchodilators  COPD exacerbation: end stage. Continue on bronchodilators. Continue on supplemental oxygen and wean back to baseline of 2L [place]. Currently at 3L [place]. Pulmon recs apprec  AKI: baseline Cr is unknown. Cr is trending down again today. Will continue to monitor   CAD: Recent right and left cardiac catheterization, EF of 45- 50%,and 100% stenosis of proximal circumflex, 99% stenosis of RCA.High risk for PCI and was advised to continue with dual antiplatelets. Mildly elevated troponin likely secondary to demand ischemia. No chest pain. Continue on decreased dose of beta blocker. Continue on tele.  Acute on chronic diastolic CHF: continue on IV lasix. Strict I/Os. Neg fluid balance  Generalized weakness: PT recs SNF. CM is aware   DVT prophylaxis: lovenox Code Status:  DNR Disposition Plan: when stable for d/c will d/c to SNF w/ palliative care outpatient f/u as well    Consultants:   Pulmon  Palliative care   Procedures:   Antimicrobials: azithromycin, cefepime   Subjective: Pt  c/o weakness  Objective: Vitals:   09/06/19 1545 09/06/19 1944 09/06/19 1955 09/07/19 0445  BP: (!) 114/57 (!) 111/59  (!) 115/53  Pulse: 85 78  79  Resp: 19 18  18   Temp: 97.8 F (36.6 C) 98.2 F (36.8 C)  98.2 F (36.8 C)  TempSrc: Oral Oral  Oral  SpO2: 94% 96% 94% 94%  Weight:    59.4 kg  Height:        Intake/Output Summary (Last 24 hours) at 09/07/2019 0729 Last data filed at 09/07/2019 0606 Gross per 24 hour  Intake --  Output 2100 ml  Net -2100 ml   Filed Weights   09/05/19 0516 09/06/19 0550 09/07/19 0445  Weight: 57.5 kg 57.7 kg 59.4 kg    Examination:  General exam: Appears calm and comfortable  Respiratory system: decreased breath sounds b/l especially at the bases. No rales Cardiovascular system: S1 & S2 normal. No rubs, gallops Gastrointestinal system: Abdomen is nondistended, soft and nontender. Normal bowel sounds heard. CNS: alert and oriented. Moves all 4 extremities Psychiatry: Judgement and insight appear normal. normal mood and affect      Data Reviewed: I have personally reviewed following labs and imaging studies  CBC: Recent Labs  Lab 08/31/19 1512 09/01/19 0439 09/03/19 0457 09/06/19 0455 09/07/19 0535  WBC 7.4 3.2* 6.8 5.9 6.0  NEUTROABS 4.6  --   --   --   --   HGB 11.0* 10.3* 9.4* 10.5* 10.7*  HCT 33.3* 32.8* 30.4* 32.9* 31.4*  MCV 92.2 96.8 97.7 93.7 88.2  PLT 158 115* 145* 198 [id number]   Basic Metabolic Panel: Recent Labs  Lab 09/03/19 0457 09/04/19  [id number] 09/05/19 0452 09/06/19 0455 09/07/19 0535  NA 138 141 138 136 135  K 5.0 4.4 4.1 3.5 3.9  CL 100 100 94* 90* 89*  CO2 32 34* 34* 37* 36*  GLUCOSE 124* 91 95 129* 97  BUN 31* 27* 30* 36* 32*  CREATININE 0.99 0.99 0.97  1.02* 1.26* 1.16*  CALCIUM 8.5* 8.4* 7.7* 7.6* 7.7*   GFR: Estimated Creatinine Clearance: 30.1 mL/min (A) (by C-G formula based on SCr of 1.16 mg/dL (H)). Liver Function Tests: Recent Labs  Lab 08/31/19 1512  AST 22  ALT 11  ALKPHOS 50   BILITOT 0.7  PROT 7.9  ALBUMIN 4.0   No results for input(s): LIPASE, AMYLASE in the last 168 hours. No results for input(s): AMMONIA in the last 168 hours. Coagulation Profile: No results for input(s): INR, PROTIME in the last 168 hours. Cardiac Enzymes: No results for input(s): CKTOTAL, CKMB, CKMBINDEX, TROPONINI in the last 168 hours. BNP (last 3 results) No results for input(s): PROBNP in the last 8760 hours. HbA1C: No results for input(s): HGBA1C in the last 72 hours. CBG: Recent Labs  Lab 09/03/19 2036 09/04/19 0817 09/04/19 1126 09/05/19 0839 09/06/19 0827  GLUCAP 168* 82 143* 75 138*   Lipid Profile: No results for input(s): CHOL, HDL, LDLCALC, TRIG, CHOLHDL, LDLDIRECT in the last 72 hours. Thyroid Function Tests: No results for input(s): TSH, T4TOTAL, FREET4, T3FREE, THYROIDAB in the last 72 hours. Anemia Panel: No results for input(s): VITAMINB12, FOLATE, FERRITIN, TIBC, IRON, RETICCTPCT in the last 72 hours. Sepsis Labs: Recent Labs  Lab 08/31/19 1512  PROCALCITON <0.10    Recent Results (from the past 240 hour(s))  Respiratory Panel by RT PCR (Flu A&B, Covid) - Nasopharyngeal Swab     Status: None   Collection Time: 08/31/19  4:36 PM   Specimen: Nasopharyngeal Swab  Result Value Ref Range Status   SARS Coronavirus 2 by RT PCR NEGATIVE NEGATIVE Final    Comment: (NOTE) SARS-CoV-2 target nucleic acids are NOT DETECTED. The SARS-CoV-2 RNA is generally detectable in upper respiratoy specimens during the acute phase of infection. The lowest concentration of SARS-CoV-2 viral copies this assay can detect is 131 copies/mL. A negative result does not preclude SARS-Cov-2 infection and should not be used as the sole basis for treatment or other patient management decisions. A negative result may occur with  improper specimen collection/handling, submission of specimen other than nasopharyngeal swab, presence of viral mutation(s) within the areas targeted by  this assay, and inadequate number of viral copies (<131 copies/mL). A negative result must be combined with clinical observations, patient history, and epidemiological information. The expected result is Negative. Fact Sheet for Patients:  PinkCheek.be Fact Sheet for Healthcare Providers:  GravelBags.it This test is not yet ap proved or cleared by the [place] FDA and  has been authorized for detection and/or diagnosis of SARS-CoV-2 by FDA under an Emergency Use Authorization (EUA). This EUA will remain  in effect (meaning this test can be used) for the duration of the COVID-19 declaration under Section 564(b)(1) of the Act, 21 U.S.C. section 360bbb-3(b)(1), unless the authorization is terminated or revoked sooner.    Influenza A by PCR NEGATIVE NEGATIVE Final   Influenza B by PCR NEGATIVE NEGATIVE Final    Comment: (NOTE) The Xpert Xpress SARS-CoV-2/FLU/RSV assay is intended as an aid in  the diagnosis of influenza from Nasopharyngeal swab specimens and  should not be used as a sole basis for treatment. Nasal washings and  aspirates are  unacceptable for Xpert Xpress SARS-CoV-2/FLU/RSV  testing. Fact Sheet for Patients: PinkCheek.be Fact Sheet for Healthcare Providers: GravelBags.it This test is not yet approved or cleared by the [place] FDA and  has been authorized for detection and/or diagnosis of SARS-CoV-2 by  FDA under an Emergency Use Authorization (EUA). This EUA will remain  in effect (meaning this test can be used) for the duration of the  Covid-19 declaration under Section 564(b)(1) of the Act, 21  U.S.C. section 360bbb-3(b)(1), unless the authorization is  terminated or revoked. Performed at [facility], [place], [place] [address]   SARS CORONAVIRUS 2 (TAT 6-24 HRS) Nasopharyngeal Nasopharyngeal Swab     Status: None    Collection Time: 08/31/19  6:52 PM   Specimen: Nasopharyngeal Swab  Result Value Ref Range Status   SARS Coronavirus 2 NEGATIVE NEGATIVE Final    Comment: (NOTE) SARS-CoV-2 target nucleic acids are NOT DETECTED. The SARS-CoV-2 RNA is generally detectable in upper and lower respiratory specimens during the acute phase of infection. Negative results do not preclude SARS-CoV-2 infection, do not rule out co-infections with other pathogens, and should not be used as the sole basis for treatment or other patient management decisions. Negative results must be combined with clinical observations, patient history, and epidemiological information. The expected result is Negative. Fact Sheet for Patients: SugarRoll.be Fact Sheet for Healthcare Providers: https://www.woods-mathews.com/ This test is not yet approved or cleared by the [place] FDA and  has been authorized for detection and/or diagnosis of SARS-CoV-2 by FDA under an Emergency Use Authorization (EUA). This EUA will remain  in effect (meaning this test can be used) for the duration of the COVID-19 declaration under Section 56 4(b)(1) of the Act, 21 U.S.C. section 360bbb-3(b)(1), unless the authorization is terminated or revoked sooner. Performed at [facility], [place] [address], [place], [place] [address]   MRSA PCR Screening     Status: None   Collection Time: 09/04/19  4:53 PM   Specimen: Nasopharyngeal  Result Value Ref Range Status   MRSA by PCR NEGATIVE NEGATIVE Final    Comment:        The GeneXpert MRSA Assay (FDA approved for NASAL specimens only), is one component of a comprehensive MRSA colonization surveillance program. It is not intended to diagnose MRSA infection nor to guide or monitor treatment for MRSA infections. Performed at [facility], [address] [place], [place] [address]          Radiology Studies: No results  found.      Scheduled Meds: . aspirin EC  81 mg Oral q morning - 10a  . calcium-vitamin D  2 tablet Oral Daily  . clopidogrel  75 mg Oral Daily  . enoxaparin (LOVENOX) injection  40 mg Subcutaneous Q24H  . fluticasone  2 spray Each Nare Daily  . furosemide  20 mg Intravenous BID  . influenza vaccine adjuvanted  0.5 mL Intramuscular Tomorrow-1000  . Ipratropium-Albuterol  1 puff Inhalation Q6H  . lidocaine  1 patch Transdermal Q24H  . mouth rinse  15 mL Mouth Rinse BID  . metoprolol succinate  12.5 mg Oral q morning - 10a  . mometasone-formoterol  2 puff Inhalation BID  . pantoprazole  40 mg Oral BID  . predniSONE  40 mg Oral Q breakfast  . sodium chloride flush  3 mL Intravenous Q12H  . vitamin B-12  3,000 mcg Oral Daily  . ascorbic acid  500 mg Oral Daily   Continuous  Infusions: . sodium chloride 250 mL (09/06/19 2215)  . azithromycin 500 mg (09/06/19 1705)  . ceFEPime (MAXIPIME) IV 2 g (09/06/19 2217)     LOS: 7 days    Time spent: 30 mins    Wyvonnia Dusky, MD [facility] Pager [phone]  If 7PM-7AM, please contact night-coverage [email or web address] Password [facility] 09/07/2019, 7:29 AM

## 2019-09-07 NOTE — NC FL2 (Signed)
[place] LEVEL OF CARE SCREENING TOOL     IDENTIFICATION  Patient Name: Catherine Landry Birthdate: Mar 21, 1936 Sex: female Admission Date (Current Location): 08/31/2019  [place] and [place] Number:  Engineering geologist and Address:  [facility], [address] [place], [place] [address]      Provider Number: [id number]  Attending Physician Name and Address:  Wyvonnia Dusky, MD  Relative Name and Phone Number:       Current Level of Care: Hospital Recommended Level of Care: [place] Prior Approval Number:    Date Approved/Denied:   PASRR Number: [id number]  Discharge Plan: SNF    Current Diagnoses: Patient Active Problem List   Diagnosis Date Noted  . Palliative care by specialist   . DNR (do not resuscitate) discussion   . Goals of care, counseling/discussion   . Multifocal pneumonia   . Essential hypertension   . AKI (acute kidney injury) ([place])   . Respiratory failure with hypoxia ([place]) 08/31/2019  . Respiratory failure, acute ([place]) 04/26/2019  . Elevated troponin 04/26/2019  . Personal history of bladder cancer 03/16/2018  . Acute on chronic respiratory failure with hypoxia ([place]) 11/23/2017  . UTI (lower urinary tract infection) 04/25/2016  . COPD exacerbation ([place]) 10/06/2015    Orientation RESPIRATION BLADDER Height & Weight     Self, Time, Situation, Place  O2(Nasal Canula 3 L) Incontinent, External catheter Weight: 130 lb 15.3 oz (59.4 kg) Height:  5\' 2"  (157.5 cm)  BEHAVIORAL SYMPTOMS/MOOD NEUROLOGICAL BOWEL NUTRITION STATUS  (None) (None) Continent Diet(Heart healthy)  AMBULATORY STATUS COMMUNICATION OF NEEDS Skin   Limited Assist Verbally Normal                       Personal Care Assistance Level of Assistance              Functional Limitations Info  Sight, Hearing, Speech Sight Info: Adequate Hearing Info: Adequate Speech Info: Adequate    SPECIAL CARE FACTORS  FREQUENCY  PT (By licensed PT)     PT Frequency: 5 x week              Contractures Contractures Info: Not present    Additional Factors Info  Code Status, Allergies, Isolation Precautions Code Status Info: DNR Allergies Info: Nitrofurantoin     Isolation Precautions Info: Airborne/Contact: COVID test came back negative but husband is positive.     Current Medications (09/07/2019):  This is the current hospital active medication list Current Facility-Administered Medications  Medication Dose Route Frequency Provider Last Rate Last Admin  . 0.9 %  sodium chloride infusion   Intravenous PRN Nolberto Hanlon, MD 10 mL/hr at 09/06/19 2215 250 mL at 09/06/19 2215  . acetaminophen (TYLENOL) tablet 650 mg  650 mg Oral Q6H PRN Lorella Nimrod, MD   650 mg at 09/06/19 1715   Or  . acetaminophen (TYLENOL) suppository 650 mg  650 mg Rectal Q6H PRN Lorella Nimrod, MD      . aspirin EC tablet 81 mg  81 mg Oral q morning - 10a Lorella Nimrod, MD   81 mg at 09/07/19 0940  . azithromycin (ZITHROMAX) 500 mg in sodium chloride 0.9 % 250 mL IVPB  500 mg Intravenous Q24H Nolberto Hanlon, MD 250 mL/hr at 09/07/19 1533 500 mg at 09/07/19 1533  . calcium-vitamin D (OSCAL WITH D) 500-200 MG-UNIT per tablet 2 tablet  2 tablet Oral Daily Lorella Nimrod, MD   2 tablet at  09/07/19 0940  . ceFEPIme (MAXIPIME) 2 g in sodium chloride 0.9 % 100 mL IVPB  2 g Intravenous Q12H Nolberto Hanlon, MD 200 mL/hr at 09/07/19 0947 2 g at 09/07/19 0947  . clopidogrel (PLAVIX) tablet 75 mg  75 mg Oral Daily Lorella Nimrod, MD   75 mg at 09/07/19 0940  . enoxaparin (LOVENOX) injection 40 mg  40 mg Subcutaneous Q24H Nolberto Hanlon, MD   40 mg at 09/07/19 0941  . fluticasone (FLONASE) 50 MCG/ACT nasal spray 2 spray  2 spray Each Nare Daily Lorella Nimrod, MD   2 spray at 09/07/19 0941  . furosemide (LASIX) injection 20 mg  20 mg Intravenous BID Nolberto Hanlon, MD   20 mg at 09/07/19 0939  . influenza vaccine adjuvanted (FLUAD) injection 0.5 mL   0.5 mL Intramuscular Tomorrow-1000 Nolberto Hanlon, MD      . Ipratropium-Albuterol (COMBIVENT) respimat 1 puff  1 puff Inhalation QID PRN Lorella Nimrod, MD   1 puff at 09/04/19 0513  . Ipratropium-Albuterol (COMBIVENT) respimat 1 puff  1 puff Inhalation Q6H Hallaji, Sheema M, RPH   1 puff at 09/07/19 1535  . lidocaine (LIDODERM) 5 % 1 patch  1 patch Transdermal Q24H Blake Divine, MD   1 patch at 09/07/19 1533  . MEDLINE mouth rinse  15 mL Mouth Rinse BID Nolberto Hanlon, MD   15 mL at 09/07/19 0949  . metoprolol succinate (TOPROL-XL) 24 hr tablet 12.5 mg  12.5 mg Oral q morning - 10a Nolberto Hanlon, MD   12.5 mg at 09/07/19 0939  . mometasone-formoterol (DULERA) 200-5 MCG/ACT inhaler 2 puff  2 puff Inhalation BID Lorella Nimrod, MD   2 puff at 09/07/19 0942  . nitroGLYCERIN (NITROSTAT) SL tablet 0.4 mg  0.4 mg Sublingual Q5 min PRN Lorella Nimrod, MD      . ondansetron (ZOFRAN) tablet 4 mg  4 mg Oral Q6H PRN Lorella Nimrod, MD       Or  . ondansetron (ZOFRAN) injection 4 mg  4 mg Intravenous Q6H PRN Lorella Nimrod, MD      . pantoprazole (PROTONIX) EC tablet 40 mg  40 mg Oral BID Lorella Nimrod, MD   40 mg at 09/07/19 0941  . polyethylene glycol (MIRALAX / GLYCOLAX) packet 17 g  17 g Oral Daily PRN Lorella Nimrod, MD   17 g at 09/06/19 1800  . predniSONE (DELTASONE) tablet 40 mg  40 mg Oral Q breakfast Nolberto Hanlon, MD   40 mg at 09/07/19 0939  . sodium chloride flush (NS) 0.9 % injection 3 mL  3 mL Intravenous Q12H Lorella Nimrod, MD   3 mL at 09/07/19 0949  . vitamin B-12 (CYANOCOBALAMIN) tablet 3,000 mcg  3,000 mcg Oral Daily Lorella Nimrod, MD   3,000 mcg at 09/07/19 0940  . vitamin C (ASCORBIC ACID) tablet 500 mg  500 mg Oral Daily Lorella Nimrod, MD   500 mg at 09/07/19 [id number]     Discharge Medications: Please see discharge summary for a list of discharge medications.  Relevant Imaging Results:  Relevant Lab Results:   Additional Information SS#: [id number]  Candie Chroman, LCSW

## 2019-09-08 ENCOUNTER — Inpatient Hospital Stay: Payer: Medicare Other

## 2019-09-08 LAB — CBC
HCT: 31.2 % — ABNORMAL LOW (ref 36.0–46.0)
Hemoglobin: 10.6 g/dL — ABNORMAL LOW (ref 12.0–15.0)
MCH: 29.9 pg (ref 26.0–34.0)
MCHC: 34 g/dL (ref 30.0–36.0)
MCV: 87.9 fL (ref 80.0–100.0)
Platelets: 217 10*3/uL (ref 150–400)
RBC: 3.55 MIL/uL — ABNORMAL LOW (ref 3.87–5.11)
RDW: 15 % (ref 11.5–15.5)
WBC: 6.6 10*3/uL (ref 4.0–10.5)
nRBC: 0 % (ref 0.0–0.2)

## 2019-09-08 LAB — GLUCOSE, CAPILLARY
Glucose-Capillary: 88 mg/dL (ref 70–99)
Glucose-Capillary: 132 mg/dL — ABNORMAL HIGH (ref 70–99)
Glucose-Capillary: 213 mg/dL — ABNORMAL HIGH (ref 70–99)

## 2019-09-08 LAB — BASIC METABOLIC PANEL
Anion gap: 10 (ref 5–15)
BUN: 29 mg/dL — ABNORMAL HIGH (ref 8–23)
CO2: 35 mmol/L — ABNORMAL HIGH (ref 22–32)
Calcium: 8 mg/dL — ABNORMAL LOW (ref 8.9–10.3)
Chloride: 95 mmol/L — ABNORMAL LOW (ref 98–111)
Creatinine, Ser: 1.14 mg/dL — ABNORMAL HIGH (ref 0.44–1.00)
GFR calc Af Amer: 52 mL/min — ABNORMAL LOW (ref >60)
GFR calc non Af Amer: 45 mL/min — ABNORMAL LOW (ref >60)
Glucose, Bld: 89 mg/dL (ref 70–99)
Potassium: 3.8 mmol/L (ref 3.5–5.1)
Sodium: 140 mmol/L (ref 135–145)

## 2019-09-08 MED ORDER — FUROSEMIDE 20 MG PO TABS
20.0000 mg | ORAL_TABLET | Freq: Two times a day (BID) | ORAL | Status: DC
  Administered 2019-09-08 – 2019-09-12 (×8): 20 mg via ORAL
  Filled 2019-09-08 (×8): qty 1

## 2019-09-08 MED ORDER — AZITHROMYCIN 250 MG PO TABS
500.0000 mg | ORAL_TABLET | Freq: Once | ORAL | Status: AC
  Administered 2019-09-08: 500 mg via ORAL
  Filled 2019-09-08: qty 2

## 2019-09-08 NOTE — Progress Notes (Signed)
Physical Therapy Treatment Patient Details Name: Catherine Landry MRN: [id number] DOB: May 25, 1936 Today's Date: 09/08/2019    History of Present Illness Catherine Landry is an 83 yo female admitted after presenting to ED with complaints of weakness and SOB, on arrival to ED tachycardic, tachypneic and saturating in 60s on 2L, PMH includes COPD, CHF, GERD, renal cancer s/p left nephrectomy. recently admitted few months ago for hypoxia    PT Comments    Pt in bed upon arrival, excited to get OOB and mobilize. Pt moves to EOB without assist or cuing. Pt educated on safe transfers training, practices several times from EOB, but needs no physical assist. Pt given some minGuard assist initially for safety. Pt assisted with pericare and clean up after episode of incontinence. Pt able to AMB in room, multiplanar gait with cues, but is exhausted before she is able to hit 28ft. Pt agreeable to sit up in chair at end of session. O2 sats wNL on 2L/min in session.    Follow Up Recommendations  SNF;Supervision for mobility/OOB     Equipment Recommendations  Rolling walker with 5" wheels    Recommendations for Other Services       Precautions / Restrictions Precautions Precautions: Fall Precaution Comments: watch O2 Restrictions Weight Bearing Restrictions: No    Mobility  Bed Mobility Overal bed mobility: Needs Assistance Bed Mobility: Supine to Sit     Supine to sit: Supervision        Transfers Overall transfer level: Needs assistance Equipment used: Rolling walker (2 wheeled) Transfers: Sit to/from Stand Sit to Stand: Supervision         General transfer comment: performs 7x in session, cues for hand placement, no physical assist needed, no LOB; ats stable on 2L (baseline flowrate); pt has some urinary incontinence, is assisted with cleanup and given supportive garments  Ambulation/Gait Ambulation/Gait assistance: Min guard Gait Distance (Feet): 38 Feet Assistive device:  Rolling walker (2 wheeled) Gait Pattern/deviations: Step-through pattern;Decreased stride length;Trunk flexed     General Gait Details: on 2L , SpO2 remain stable low 90s. Pt is fatigued at end of session, unable to AMB further.   Stairs             Wheelchair Mobility    Modified Rankin (Stroke Patients Only)       Balance Overall balance assessment: Needs assistance Sitting-balance support: Feet supported;No upper extremity supported Sitting balance-Leahy Scale: Normal     Standing balance support: Bilateral upper extremity supported;During functional activity Standing balance-Leahy Scale: Good Standing balance comment: reliant on UE support from RW for ambulation and transfers                            Cognition Arousal/Alertness: Awake/alert Behavior During Therapy: WFL for tasks assessed/performed Overall Cognitive Status: Within Functional Limits for tasks assessed                                        Exercises      General Comments        Pertinent Vitals/Pain Pain Assessment: No/denies pain    Home Living                      Prior Function            PT Goals (current goals can now be found in the  care plan section) Acute Rehab PT Goals Patient Stated Goal: get stronger PT Goal Formulation: With patient Time For Goal Achievement: 09/06/2019 Potential to Achieve Goals: Good Progress towards PT goals: Progressing toward goals    Frequency    Min 2X/week      PT Plan Current plan remains appropriate    Co-evaluation              AM-PAC PT "6 Clicks" Mobility   Outcome Measure  Help needed turning from your back to your side while in a flat bed without using bedrails?: None Help needed moving from lying on your back to sitting on the side of a flat bed without using bedrails?: None Help needed moving to and from a bed to a chair (including a wheelchair)?: A Little Help needed standing up  from a chair using your arms (e.g., wheelchair or bedside chair)?: A Little Help needed to walk in hospital room?: A Little Help needed climbing 3-5 steps with a railing? : A Little 6 Click Score: 20    End of Session Equipment Utilized During Treatment: Oxygen Activity Tolerance: Patient limited by fatigue;Patient tolerated treatment well Patient left: in chair;with call bell/phone within reach;with chair alarm set Nurse Communication: Mobility status PT Visit Diagnosis: Muscle weakness (generalized) (M62.81);Difficulty in walking, not elsewhere classified (R26.2)     Time: [id number] PT Time Calculation (min) (ACUTE ONLY): 40 min  Charges:  $Gait Training: 8-22 mins $Therapeutic Exercise: 23-37 mins                     12:41 PM, 09/08/19 Etta Grandchild, PT, DPT Physical Therapist - [facility]  [phone] ([place])    Gaby Harney C 09/08/2019, 12:37 PM

## 2019-09-08 NOTE — TOC Progression Note (Signed)
Transition of Care [facility]) - Progression Note    Patient Details  Name: Catherine Landry MRN: [id number] Date of Birth: 1936-07-05  Transition of Care [facility]) CM/SW Victor, LCSW Phone Number: 09/08/2019, 6:04 PM  Clinical Narrative: Patient has one bed offer so far from [facility] in [place]. He is unsure if he wants [facility] for her anymore because he has had difficulty reaching her husband at the facility. He was discharge there yesterday. In our first conversation earlier this week, patient stated that her brother is HCPOA for both she and her husband and he would be making all the decisions. Patient openly stated that she does not have capacity to make her own decisions. CSW has been working closely with patient's brother regarding disposition planning. He stated she and her husband have a bed at [facility] for after rehab and is working on getting them the financial information they need to pursue that.   Expected Discharge Plan: Skilled Nursing Facility Barriers to Discharge: Continued Medical Work up  Expected Discharge Plan and Services Expected Discharge Plan: [place] Choice: [place] arrangements for the past 2 months: Single Family Home                                       Social Determinants of Health (SDOH) Interventions    Readmission Risk Interventions Readmission Risk Prevention Plan 09/05/2019 04/30/2019  Transportation Screening - Complete  PCP or Specialist Appt within 5-7 Days - Complete  PCP or Specialist Appt within 3-5 Days Complete -  Home Care Screening - Complete  Medication Review (RN CM) - Complete  HRI or Home Care Consult Complete -  Social Work Consult for Recovery Care Planning/Counseling Complete -  Palliative Care Screening Complete -  Some recent data might be hidden

## 2019-09-08 NOTE — Progress Notes (Signed)
PROGRESS NOTE    Catherine Landry  [id number] DOB: Mar 24, 1936 DOA: 08/31/2019 PCP: Tracie Harrier, MD      Assessment & Plan:   Active Problems:   Respiratory failure with hypoxia ([place])   Palliative care by specialist   DNR (do not resuscitate) discussion   Goals of care, counseling/discussion   Acute on chronic hypoxic & hyercarbic respiratory failure: improving slowing. Likely secondary to COVID19 vs bacterial pneumonia. Although COVID-19 test is negative. Patient is high risk due to sick contact as her husband recently diagnosed with COVID-19 pneumonia & currently inpatient. Continue on steroids, vitamin c. Continue on airborne & contact precautions. Completed abx course today. Repeat CXR shows no pneumonia.   Pneumonia: secondary to COVID19 vs bacterial. Possibly gram positive or gram neg. Completed abx course. Continue on steroids & bronchodilators.   COPD exacerbation: end stage. Continue on bronchodilators. Continue on supplemental oxygen. Back to baseline of 2L [place] today. Pulmon recs apprec  AKI: baseline Cr is unknown. Cr is stable. Will continue to monitor   CAD: Recent right and left cardiac catheterization, EF of 45- 50%,and 100% stenosis of proximal circumflex, 99% stenosis of RCA.High risk for PCI and was advised to continue with dual antiplatelets. Mildly elevated troponin likely secondary to demand ischemia. No chest pain. Continue on decreased dose of beta blocker. Continue on tele.  Acute on chronic diastolic CHF: will transition to po lasix. Strict I/Os. Neg fluid balance  Generalized weakness: PT recs SNF. CM is aware   DVT prophylaxis: lovenox Code Status:  DNR Disposition Plan:  stable for d/c will d/c to SNF w/ palliative care outpatient f/u as well    Consultants:   Pulmon  Palliative care   Procedures:  Antimicrobials: completed abx course  Subjective: Pt c/o malaise  Objective: Vitals:   09/07/19 1632 09/07/19 2051 09/08/19 0443  09/08/19 0825  BP: 106/69 120/71 126/68 (!) 121/59  Pulse: 95 84 80 80  Resp: 20 20 20 18   Temp: 98.1 F (36.7 C) 98.2 F (36.8 C) 97.9 F (36.6 C) 98.1 F (36.7 C)  TempSrc:  Oral Oral   SpO2: 97% 92% (!) 89% 90%  Weight:   56.2 kg   Height:        Intake/Output Summary (Last 24 hours) at 09/08/2019 1426 Last data filed at 09/08/2019 1019 Gross per 24 hour  Intake 340 ml  Output 2050 ml  Net -1710 ml   Filed Weights   09/06/19 0550 09/07/19 0445 09/08/19 0443  Weight: 57.7 kg 59.4 kg 56.2 kg    Examination:  General exam: Appears calm and comfortable  Respiratory system: diminished breath sounds b/l especially at the bases. No rhonchi Cardiovascular system: S1 & S2 normal. No rubs, gallops Gastrointestinal system: Abdomen is nondistended, soft and nontender. Normal bowel sounds heard. CNS: alert and oriented. Moves all 4 extremities Psychiatry: Judgement and insight appear normal. normal mood and affect      Data Reviewed: I have personally reviewed following labs and imaging studies  CBC: Recent Labs  Lab 09/03/19 0457 09/06/19 0455 09/07/19 0535 09/08/19 0721  WBC 6.8 5.9 6.0 6.6  HGB 9.4* 10.5* 10.7* 10.6*  HCT 30.4* 32.9* 31.4* 31.2*  MCV 97.7 93.7 88.2 87.9  PLT 145* 198 212 [id number]   Basic Metabolic Panel: Recent Labs  Lab 09/04/19 0526 09/05/19 0452 09/06/19 0455 09/07/19 0535 09/08/19 0721  NA 141 138 136 135 140  K 4.4 4.1 3.5 3.9 3.8  CL 100 94* 90* 89* 95*  CO2  34* 34* 37* 36* 35*  GLUCOSE 91 95 129* 97 89  BUN 27* 30* 36* 32* 29*  CREATININE 0.99 0.97  1.02* 1.26* 1.16* 1.14*  CALCIUM 8.4* 7.7* 7.6* 7.7* 8.0*   GFR: Estimated Creatinine Clearance: 30.6 mL/min (A) (by C-G formula based on SCr of 1.14 mg/dL (H)). Liver Function Tests: No results for input(s): AST, ALT, ALKPHOS, BILITOT, PROT, ALBUMIN in the last 168 hours. No results for input(s): LIPASE, AMYLASE in the last 168 hours. No results for input(s): AMMONIA in the last 168  hours. Coagulation Profile: No results for input(s): INR, PROTIME in the last 168 hours. Cardiac Enzymes: No results for input(s): CKTOTAL, CKMB, CKMBINDEX, TROPONINI in the last 168 hours. BNP (last 3 results) No results for input(s): PROBNP in the last 8760 hours. HbA1C: No results for input(s): HGBA1C in the last 72 hours. CBG: Recent Labs  Lab 09/05/19 0839 09/06/19 0827 09/07/19 0950 09/08/19 0827 09/08/19 1225  GLUCAP 75 138* 84 88 132*   Lipid Profile: No results for input(s): CHOL, HDL, LDLCALC, TRIG, CHOLHDL, LDLDIRECT in the last 72 hours. Thyroid Function Tests: No results for input(s): TSH, T4TOTAL, FREET4, T3FREE, THYROIDAB in the last 72 hours. Anemia Panel: No results for input(s): VITAMINB12, FOLATE, FERRITIN, TIBC, IRON, RETICCTPCT in the last 72 hours. Sepsis Labs: No results for input(s): PROCALCITON, LATICACIDVEN in the last 168 hours.  Recent Results (from the past 240 hour(s))  Respiratory Panel by RT PCR (Flu A&B, Covid) - Nasopharyngeal Swab     Status: None   Collection Time: 08/31/19  4:36 PM   Specimen: Nasopharyngeal Swab  Result Value Ref Range Status   SARS Coronavirus 2 by RT PCR NEGATIVE NEGATIVE Final    Comment: (NOTE) SARS-CoV-2 target nucleic acids are NOT DETECTED. The SARS-CoV-2 RNA is generally detectable in upper respiratoy specimens during the acute phase of infection. The lowest concentration of SARS-CoV-2 viral copies this assay can detect is 131 copies/mL. A negative result does not preclude SARS-Cov-2 infection and should not be used as the sole basis for treatment or other patient management decisions. A negative result may occur with  improper specimen collection/handling, submission of specimen other than nasopharyngeal swab, presence of viral mutation(s) within the areas targeted by this assay, and inadequate number of viral copies (<131 copies/mL). A negative result must be combined with clinical observations, patient  history, and epidemiological information. The expected result is Negative. Fact Sheet for Patients:  PinkCheek.be Fact Sheet for Healthcare Providers:  GravelBags.it This test is not yet ap proved or cleared by the [place] FDA and  has been authorized for detection and/or diagnosis of SARS-CoV-2 by FDA under an Emergency Use Authorization (EUA). This EUA will remain  in effect (meaning this test can be used) for the duration of the COVID-19 declaration under Section 564(b)(1) of the Act, 21 U.S.C. section 360bbb-3(b)(1), unless the authorization is terminated or revoked sooner.    Influenza A by PCR NEGATIVE NEGATIVE Final   Influenza B by PCR NEGATIVE NEGATIVE Final    Comment: (NOTE) The Xpert Xpress SARS-CoV-2/FLU/RSV assay is intended as an aid in  the diagnosis of influenza from Nasopharyngeal swab specimens and  should not be used as a sole basis for treatment. Nasal washings and  aspirates are unacceptable for Xpert Xpress SARS-CoV-2/FLU/RSV  testing. Fact Sheet for Patients: PinkCheek.be Fact Sheet for Healthcare Providers: GravelBags.it This test is not yet approved or cleared by the [place] FDA and  has been authorized for detection and/or diagnosis of  SARS-CoV-2 by  FDA under an Emergency Use Authorization (EUA). This EUA will remain  in effect (meaning this test can be used) for the duration of the  Covid-19 declaration under Section 564(b)(1) of the Act, 21  U.S.C. section 360bbb-3(b)(1), unless the authorization is  terminated or revoked. Performed at [facility], [place], [place] [address]   SARS CORONAVIRUS 2 (TAT 6-24 HRS) Nasopharyngeal Nasopharyngeal Swab     Status: None   Collection Time: 08/31/19  6:52 PM   Specimen: Nasopharyngeal Swab  Result Value Ref Range Status   SARS Coronavirus 2 NEGATIVE NEGATIVE  Final    Comment: (NOTE) SARS-CoV-2 target nucleic acids are NOT DETECTED. The SARS-CoV-2 RNA is generally detectable in upper and lower respiratory specimens during the acute phase of infection. Negative results do not preclude SARS-CoV-2 infection, do not rule out co-infections with other pathogens, and should not be used as the sole basis for treatment or other patient management decisions. Negative results must be combined with clinical observations, patient history, and epidemiological information. The expected result is Negative. Fact Sheet for Patients: SugarRoll.be Fact Sheet for Healthcare Providers: https://www.woods-mathews.com/ This test is not yet approved or cleared by the [place] FDA and  has been authorized for detection and/or diagnosis of SARS-CoV-2 by FDA under an Emergency Use Authorization (EUA). This EUA will remain  in effect (meaning this test can be used) for the duration of the COVID-19 declaration under Section 56 4(b)(1) of the Act, 21 U.S.C. section 360bbb-3(b)(1), unless the authorization is terminated or revoked sooner. Performed at [facility], [place] [address], [place], [place] [address]   MRSA PCR Screening     Status: None   Collection Time: 09/04/19  4:53 PM   Specimen: Nasopharyngeal  Result Value Ref Range Status   MRSA by PCR NEGATIVE NEGATIVE Final    Comment:        The GeneXpert MRSA Assay (FDA approved for NASAL specimens only), is one component of a comprehensive MRSA colonization surveillance program. It is not intended to diagnose MRSA infection nor to guide or monitor treatment for MRSA infections. Performed at [facility], [address], [place], [place] [address]          Radiology Studies: [facility] Chest [place] 1 View  Result Date: 09/08/2019 CLINICAL DATA:  Pneumonia. EXAM: PORTABLE CHEST 1 VIEW COMPARISON:  September 04, 2019. FINDINGS: The heart size and  mediastinal contours are within normal limits. No pneumothorax or pleural effusion is noted. Minimal bibasilar subsegmental atelectasis or scarring is noted. Atherosclerosis of thoracic aorta is noted. The visualized skeletal structures are unremarkable. IMPRESSION: Aortic atherosclerosis. Minimal bibasilar subsegmental atelectasis or scarring. Electronically Signed   By: Marijo Conception M.D.   On: 09/08/2019 09:10        Scheduled Meds: . aspirin EC  81 mg Oral q morning - 10a  . azithromycin  500 mg Oral Once  . calcium-vitamin D  2 tablet Oral Daily  . clopidogrel  75 mg Oral Daily  . enoxaparin (LOVENOX) injection  40 mg Subcutaneous Q24H  . fluticasone  2 spray Each Nare Daily  . furosemide  20 mg Intravenous BID  . influenza vaccine adjuvanted  0.5 mL Intramuscular Tomorrow-1000  . Ipratropium-Albuterol  1 puff Inhalation Q6H  . lidocaine  1 patch Transdermal Q24H  . mouth rinse  15 mL Mouth Rinse BID  . metoprolol succinate  12.5 mg Oral q morning - 10a  . mometasone-formoterol  2  puff Inhalation BID  . pantoprazole  40 mg Oral BID  . predniSONE  40 mg Oral Q breakfast  . sodium chloride flush  3 mL Intravenous Q12H  . vitamin B-12  3,000 mcg Oral Daily  . ascorbic acid  500 mg Oral Daily   Continuous Infusions: . sodium chloride 250 mL (09/06/19 2215)     LOS: 8 days    Time spent: 30 mins    Wyvonnia Dusky, MD [facility] Pager [phone]  If 7PM-7AM, please contact night-coverage [email or web address] Password [email or web address] 09/08/2019, 2:26 PM

## 2019-09-09 LAB — CBC
HCT: 31.5 % — ABNORMAL LOW (ref 36.0–46.0)
Hemoglobin: 10.7 g/dL — ABNORMAL LOW (ref 12.0–15.0)
MCH: 30.2 pg (ref 26.0–34.0)
MCHC: 34 g/dL (ref 30.0–36.0)
MCV: 89 fL (ref 80.0–100.0)
Platelets: 213 10*3/uL (ref 150–400)
RBC: 3.54 MIL/uL — ABNORMAL LOW (ref 3.87–5.11)
RDW: 15.2 % (ref 11.5–15.5)
WBC: 6.4 10*3/uL (ref 4.0–10.5)
nRBC: 0 % (ref 0.0–0.2)

## 2019-09-09 LAB — BASIC METABOLIC PANEL
Anion gap: 9 (ref 5–15)
BUN: 29 mg/dL — ABNORMAL HIGH (ref 8–23)
CO2: 33 mmol/L — ABNORMAL HIGH (ref 22–32)
Calcium: 7.9 mg/dL — ABNORMAL LOW (ref 8.9–10.3)
Chloride: 94 mmol/L — ABNORMAL LOW (ref 98–111)
Creatinine, Ser: 1.06 mg/dL — ABNORMAL HIGH (ref 0.44–1.00)
GFR calc Af Amer: 57 mL/min — ABNORMAL LOW (ref >60)
GFR calc non Af Amer: 49 mL/min — ABNORMAL LOW (ref >60)
Glucose, Bld: 110 mg/dL — ABNORMAL HIGH (ref 70–99)
Potassium: 4.4 mmol/L (ref 3.5–5.1)
Sodium: 136 mmol/L (ref 135–145)

## 2019-09-09 LAB — GLUCOSE, CAPILLARY
Glucose-Capillary: 87 mg/dL (ref 70–99)
Glucose-Capillary: 135 mg/dL — ABNORMAL HIGH (ref 70–99)

## 2019-09-09 LAB — SARS CORONAVIRUS 2 (TAT 6-24 HRS): SARS Coronavirus 2: NEGATIVE

## 2019-09-09 NOTE — Progress Notes (Signed)
PROGRESS NOTE    Catherine Landry  [id number] DOB: 15-Mar-1936 DOA: 08/31/2019 PCP: Tracie Harrier, MD      Assessment & Plan:   Active Problems:   Respiratory failure with hypoxia ([place])   Palliative care by specialist   DNR (do not resuscitate) discussion   Goals of care, counseling/discussion   Acute on chronic hypoxic & hyercarbic respiratory failure: continues to improve daily. Likely secondary to COVID19 vs bacterial pneumonia. Although COVID-19 test is negative. Patient is high risk due to sick contact as her husband recently diagnosed with COVID-19 pneumonia & currently inpatient. Continue on steroids, vitamin c. Continue on airborne & contact precautions. Completed abx course 09/08/19. Repeat CXR shows no pneumonia.   Pneumonia: secondary to COVID19 vs bacterial. Possibly gram positive or gram neg. Completed abx course. Continue on steroids & bronchodilators.   COPD exacerbation: end stage. Continue on bronchodilators. Continue on supplemental oxygen. Back to baseline of 2L [place]. Pulmon recs apprec  AKI: baseline Cr is unknown. Cr continues to trend down. Will continue to monitor   CAD: Recent right and left cardiac catheterization, EF of 45- 50%,and 100% stenosis of proximal circumflex, 99% stenosis of RCA.High risk for PCI and was advised to continue with dual antiplatelets. Mildly elevated troponin likely secondary to demand ischemia. No chest pain. Continue on decreased dose of beta blocker. Continue on tele.  Acute on chronic diastolic CHF: will transition to po lasix. Strict I/Os. Neg fluid balance  Generalized weakness: PT recs SNF. CM is aware   DVT prophylaxis: lovenox Code Status:  DNR Disposition Plan:  stable for d/c will d/c to SNF w/ palliative care outpatient f/u as well. Will notify CM    Consultants:   Pulmon  Palliative care   Procedures:  Antimicrobials: completed abx course  Subjective: Pt c/o weakness  Objective: Vitals:   09/08/19 0825 09/08/19 1532 09/08/19 2026 09/09/19 0526  BP: (!) 121/59 136/66 (!) 155/75 128/74  Pulse: 80 79 85 72  Resp: 18 19  16   Temp: 98.1 F (36.7 C) 98 F (36.7 C) (!) 97.5 F (36.4 C) (!) 97.4 F (36.3 C)  TempSrc:   Oral Oral  SpO2: 90% 95% 98% 94%  Weight:    57.1 kg  Height:        Intake/Output Summary (Last 24 hours) at 09/09/2019 0741 Last data filed at 09/08/2019 1130 Gross per 24 hour  Intake --  Output 1150 ml  Net -1150 ml   Filed Weights   09/07/19 0445 09/08/19 0443 09/09/19 0526  Weight: 59.4 kg 56.2 kg 57.1 kg    Examination:  General exam: Appears calm and comfortable  Respiratory system: decreased breath sounds b/l especially at the bases. No rales Cardiovascular system: S1 & S2 normal. No rubs, gallops Gastrointestinal system: Abdomen is nondistended, soft and nontender. Normal bowel sounds heard. CNS: alert and oriented. Moves all 4 extremities Psychiatry: Judgement and insight appear normal. normal mood and affect      Data Reviewed: I have personally reviewed following labs and imaging studies  CBC: Recent Labs  Lab 09/03/19 0457 09/06/19 0455 09/07/19 0535 09/08/19 0721 09/09/19 0500  WBC 6.8 5.9 6.0 6.6 6.4  HGB 9.4* 10.5* 10.7* 10.6* 10.7*  HCT 30.4* 32.9* 31.4* 31.2* 31.5*  MCV 97.7 93.7 88.2 87.9 89.0  PLT 145* 198 212 217 [id number]   Basic Metabolic Panel: Recent Labs  Lab 09/05/19 0452 09/06/19 0455 09/07/19 0535 09/08/19 0721 09/09/19 0500  NA 138 136 135 140 136  K 4.1  3.5 3.9 3.8 4.4  CL 94* 90* 89* 95* 94*  CO2 34* 37* 36* 35* 33*  GLUCOSE 95 129* 97 89 110*  BUN 30* 36* 32* 29* 29*  CREATININE 0.97  1.02* 1.26* 1.16* 1.14* 1.06*  CALCIUM 7.7* 7.6* 7.7* 8.0* 7.9*   GFR: Estimated Creatinine Clearance: 32.9 mL/min (A) (by C-G formula based on SCr of 1.06 mg/dL (H)). Liver Function Tests: No results for input(s): AST, ALT, ALKPHOS, BILITOT, PROT, ALBUMIN in the last 168 hours. No results for input(s): LIPASE,  AMYLASE in the last 168 hours. No results for input(s): AMMONIA in the last 168 hours. Coagulation Profile: No results for input(s): INR, PROTIME in the last 168 hours. Cardiac Enzymes: No results for input(s): CKTOTAL, CKMB, CKMBINDEX, TROPONINI in the last 168 hours. BNP (last 3 results) No results for input(s): PROBNP in the last 8760 hours. HbA1C: No results for input(s): HGBA1C in the last 72 hours. CBG: Recent Labs  Lab 09/06/19 0827 09/07/19 0950 09/08/19 0827 09/08/19 1225 09/08/19 1621  GLUCAP 138* 84 88 132* 213*   Lipid Profile: No results for input(s): CHOL, HDL, LDLCALC, TRIG, CHOLHDL, LDLDIRECT in the last 72 hours. Thyroid Function Tests: No results for input(s): TSH, T4TOTAL, FREET4, T3FREE, THYROIDAB in the last 72 hours. Anemia Panel: No results for input(s): VITAMINB12, FOLATE, FERRITIN, TIBC, IRON, RETICCTPCT in the last 72 hours. Sepsis Labs: No results for input(s): PROCALCITON, LATICACIDVEN in the last 168 hours.  Recent Results (from the past 240 hour(s))  Respiratory Panel by RT PCR (Flu A&B, Covid) - Nasopharyngeal Swab     Status: None   Collection Time: 08/31/19  4:36 PM   Specimen: Nasopharyngeal Swab  Result Value Ref Range Status   SARS Coronavirus 2 by RT PCR NEGATIVE NEGATIVE Final    Comment: (NOTE) SARS-CoV-2 target nucleic acids are NOT DETECTED. The SARS-CoV-2 RNA is generally detectable in upper respiratoy specimens during the acute phase of infection. The lowest concentration of SARS-CoV-2 viral copies this assay can detect is 131 copies/mL. A negative result does not preclude SARS-Cov-2 infection and should not be used as the sole basis for treatment or other patient management decisions. A negative result may occur with  improper specimen collection/handling, submission of specimen other than nasopharyngeal swab, presence of viral mutation(s) within the areas targeted by this assay, and inadequate number of viral copies (<131  copies/mL). A negative result must be combined with clinical observations, patient history, and epidemiological information. The expected result is Negative. Fact Sheet for Patients:  PinkCheek.be Fact Sheet for Healthcare Providers:  GravelBags.it This test is not yet ap proved or cleared by the [place] FDA and  has been authorized for detection and/or diagnosis of SARS-CoV-2 by FDA under an Emergency Use Authorization (EUA). This EUA will remain  in effect (meaning this test can be used) for the duration of the COVID-19 declaration under Section 564(b)(1) of the Act, 21 U.S.C. section 360bbb-3(b)(1), unless the authorization is terminated or revoked sooner.    Influenza A by PCR NEGATIVE NEGATIVE Final   Influenza B by PCR NEGATIVE NEGATIVE Final    Comment: (NOTE) The Xpert Xpress SARS-CoV-2/FLU/RSV assay is intended as an aid in  the diagnosis of influenza from Nasopharyngeal swab specimens and  should not be used as a sole basis for treatment. Nasal washings and  aspirates are unacceptable for Xpert Xpress SARS-CoV-2/FLU/RSV  testing. Fact Sheet for Patients: PinkCheek.be Fact Sheet for Healthcare Providers: GravelBags.it This test is not yet approved or cleared by the  [place] Architectural technologist and  has been authorized for detection and/or diagnosis of SARS-CoV-2 by  FDA under an Print production planner (EUA). This EUA will remain  in effect (meaning this test can be used) for the duration of the  Covid-19 declaration under Section 564(b)(1) of the Act, 21  U.S.C. section 360bbb-3(b)(1), unless the authorization is  terminated or revoked. Performed at [facility], [place], [place] [address]   SARS CORONAVIRUS 2 (TAT 6-24 HRS) Nasopharyngeal Nasopharyngeal Swab     Status: None   Collection Time: 08/31/19  6:52 PM   Specimen:  Nasopharyngeal Swab  Result Value Ref Range Status   SARS Coronavirus 2 NEGATIVE NEGATIVE Final    Comment: (NOTE) SARS-CoV-2 target nucleic acids are NOT DETECTED. The SARS-CoV-2 RNA is generally detectable in upper and lower respiratory specimens during the acute phase of infection. Negative results do not preclude SARS-CoV-2 infection, do not rule out co-infections with other pathogens, and should not be used as the sole basis for treatment or other patient management decisions. Negative results must be combined with clinical observations, patient history, and epidemiological information. The expected result is Negative. Fact Sheet for Patients: SugarRoll.be Fact Sheet for Healthcare Providers: https://www.woods-mathews.com/ This test is not yet approved or cleared by the [place] FDA and  has been authorized for detection and/or diagnosis of SARS-CoV-2 by FDA under an Emergency Use Authorization (EUA). This EUA will remain  in effect (meaning this test can be used) for the duration of the COVID-19 declaration under Section 56 4(b)(1) of the Act, 21 U.S.C. section 360bbb-3(b)(1), unless the authorization is terminated or revoked sooner. Performed at [facility], [place] [address], [place], [place] [address]   MRSA PCR Screening     Status: None   Collection Time: 09/04/19  4:53 PM   Specimen: Nasopharyngeal  Result Value Ref Range Status   MRSA by PCR NEGATIVE NEGATIVE Final    Comment:        The GeneXpert MRSA Assay (FDA approved for NASAL specimens only), is one component of a comprehensive MRSA colonization surveillance program. It is not intended to diagnose MRSA infection nor to guide or monitor treatment for MRSA infections. Performed at [facility], [address] [place], [place] [address]          Radiology Studies: [facility] Chest [place] 1 View  Result Date: 09/08/2019 CLINICAL DATA:  Pneumonia.  EXAM: PORTABLE CHEST 1 VIEW COMPARISON:  September 04, 2019. FINDINGS: The heart size and mediastinal contours are within normal limits. No pneumothorax or pleural effusion is noted. Minimal bibasilar subsegmental atelectasis or scarring is noted. Atherosclerosis of thoracic aorta is noted. The visualized skeletal structures are unremarkable. IMPRESSION: Aortic atherosclerosis. Minimal bibasilar subsegmental atelectasis or scarring. Electronically Signed   By: Marijo Conception M.D.   On: 09/08/2019 09:10        Scheduled Meds: . aspirin EC  81 mg Oral q morning - 10a  . calcium-vitamin D  2 tablet Oral Daily  . clopidogrel  75 mg Oral Daily  . enoxaparin (LOVENOX) injection  40 mg Subcutaneous Q24H  . fluticasone  2 spray Each Nare Daily  . furosemide  20 mg Oral BID  . influenza vaccine adjuvanted  0.5 mL Intramuscular Tomorrow-1000  . Ipratropium-Albuterol  1 puff Inhalation Q6H  . lidocaine  1 patch Transdermal Q24H  . mouth rinse  15 mL Mouth Rinse BID  . metoprolol succinate  12.5 mg Oral q morning - 10a  .  mometasone-formoterol  2 puff Inhalation BID  . pantoprazole  40 mg Oral BID  . predniSONE  40 mg Oral Q breakfast  . sodium chloride flush  3 mL Intravenous Q12H  . vitamin B-12  3,000 mcg Oral Daily  . ascorbic acid  500 mg Oral Daily   Continuous Infusions: . sodium chloride 250 mL (09/06/19 2215)     LOS: 9 days    Time spent: 30 mins    Wyvonnia Dusky, MD [facility] Pager [phone]  If 7PM-7AM, please contact night-coverage [email or web address] Password [email or web address] 09/09/2019, 7:41 AM

## 2019-09-09 NOTE — TOC Progression Note (Addendum)
Transition of Care [facility]) - Progression Note    Patient Details  Name: Catherine Landry MRN: [id number] Date of Birth: 17-Nov-1935  Transition of Care [facility]) CM/SW Contact  Marshell Garfinkel, RN Phone Number: 09/09/2019, 1:52 PM  Clinical Narrative:    MD ready for discharge. Covid 19 screening lab 09/03/19 and per Otila Kluver with [facility] they will need another one. They can take her tomorrow if test is back. Update: RNCM spoke with patient and she said "she is not going to [facility]; she is going to [facility]".  She asked that I speak with her brother that "is handling all this" [phone]. I have left confidential message with brother Rockwell [place] to make him aware that [facility] will need to approve and that Medicare will not pay for [facility] as it is ALF. Brother called RNCM back and said that [facility] will need to evaluate patient and should have a bed available on Monday. MD updated.   Expected Discharge Plan: Skilled Nursing Facility Barriers to Discharge: Continued Medical Work up  Expected Discharge Plan and Services Expected Discharge Plan: [place]: [place] arrangements for the past 2 months: Single Family Home                                       Social Determinants of Health (SDOH) Interventions    Readmission Risk Interventions Readmission Risk Prevention Plan 09/05/2019 04/30/2019  Transportation Screening - Complete  PCP or Specialist Appt within 5-7 Days - Complete  PCP or Specialist Appt within 3-5 Days Complete -  Home Care Screening - Complete  Medication Review (RN CM) - Complete  HRI or Home Care Consult Complete -  Social Work Consult for Recovery Care Planning/Counseling Complete -  Palliative Care Screening Complete -  Some recent data might be hidden

## 2019-09-09 NOTE — NC FL2 (Signed)
[place] LEVEL OF CARE SCREENING TOOL     IDENTIFICATION  Patient Name: Catherine Landry Birthdate: Nov 29, 1935 Sex: female Admission Date (Current Location): 08/31/2019  [place] and [place] Number:  Engineering geologist and Address:  [facility], [address], [place], [place] [address]      Provider Number: [id number]  Attending Physician Name and Address:  Wyvonnia Dusky, MD  Relative Name and Phone Number:       Current Level of Care: Domiciliary (Rest home) Recommended Level of Care: [place] Prior Approval Number:    Date Approved/Denied:   PASRR Number: [id number]  Discharge Plan: Domiciliary (Rest home)    Current Diagnoses: Patient Active Problem List   Diagnosis Date Noted  . Palliative care by specialist   . DNR (do not resuscitate) discussion   . Goals of care, counseling/discussion   . Multifocal pneumonia   . Essential hypertension   . AKI (acute kidney injury) ([place])   . Respiratory failure with hypoxia ([place]) 08/31/2019  . Respiratory failure, acute ([place]) 04/26/2019  . Elevated troponin 04/26/2019  . Personal history of bladder cancer 03/16/2018  . Acute on chronic respiratory failure with hypoxia ([place]) 11/23/2017  . UTI (lower urinary tract infection) 04/25/2016  . COPD exacerbation ([place]) 10/06/2015    Orientation RESPIRATION BLADDER Height & Weight     Self, Time, Situation, Place  Normal, O2(2 liter supplemental O2) Continent Weight: 57.1 kg Height:  5\' 2"  (157.5 cm)  BEHAVIORAL SYMPTOMS/MOOD NEUROLOGICAL BOWEL NUTRITION STATUS  (None) (None) Continent Diet(Heart Healthy)  AMBULATORY STATUS COMMUNICATION OF NEEDS Skin   Limited Assist Verbally Normal                       Personal Care Assistance Level of Assistance              Functional Limitations Info  Sight, Hearing, Speech Sight Info: Adequate Hearing Info: Adequate Speech Info: Adequate    SPECIAL CARE  FACTORS FREQUENCY  PT (By licensed PT), OT (By licensed OT)     PT Frequency: 5 X week OT Frequency: 3 X per week            Contractures Contractures Info: Not present    Additional Factors Info  Code Status, Allergies, Isolation Precautions Code Status Info: DNR Allergies Info: Nitrofurantoin     Isolation Precautions Info: Airborne/Contact: COVID test came back negative but husband is positive.     Current Medications (09/09/2019):  This is the current hospital active medication list Current Facility-Administered Medications  Medication Dose Route Frequency Provider Last Rate Last Admin  . 0.9 %  sodium chloride infusion   Intravenous PRN Nolberto Hanlon, MD 10 mL/hr at 09/06/19 2215 250 mL at 09/06/19 2215  . acetaminophen (TYLENOL) tablet 650 mg  650 mg Oral Q6H PRN Lorella Nimrod, MD   650 mg at 09/09/19 1552   Or  . acetaminophen (TYLENOL) suppository 650 mg  650 mg Rectal Q6H PRN Lorella Nimrod, MD      . aspirin EC tablet 81 mg  81 mg Oral q morning - 10a Lorella Nimrod, MD   81 mg at 09/09/19 0915  . calcium-vitamin D (OSCAL WITH D) 500-200 MG-UNIT per tablet 2 tablet  2 tablet Oral Daily Lorella Nimrod, MD   2 tablet at 09/09/19 0915  . clopidogrel (PLAVIX) tablet 75 mg  75 mg Oral Daily Lorella Nimrod, MD   75 mg at 09/09/19 0915  .  enoxaparin (LOVENOX) injection 40 mg  40 mg Subcutaneous Q24H Nolberto Hanlon, MD   40 mg at 09/09/19 0912  . fluticasone (FLONASE) 50 MCG/ACT nasal spray 2 spray  2 spray Each Nare Daily Lorella Nimrod, MD   2 spray at 09/09/19 0916  . furosemide (LASIX) tablet 20 mg  20 mg Oral BID Wyvonnia Dusky, MD   20 mg at 09/09/19 0915  . influenza vaccine adjuvanted (FLUAD) injection 0.5 mL  0.5 mL Intramuscular Tomorrow-1000 Nolberto Hanlon, MD      . Ipratropium-Albuterol (COMBIVENT) respimat 1 puff  1 puff Inhalation QID PRN Lorella Nimrod, MD   1 puff at 09/04/19 0513  . Ipratropium-Albuterol (COMBIVENT) respimat 1 puff  1 puff Inhalation Q6H Hallaji,  Sheema M, RPH   1 puff at 09/09/19 1551  . lidocaine (LIDODERM) 5 % 1 patch  1 patch Transdermal Q24H Blake Divine, MD   1 patch at 09/09/19 1550  . MEDLINE mouth rinse  15 mL Mouth Rinse BID Nolberto Hanlon, MD   15 mL at 09/09/19 0917  . metoprolol succinate (TOPROL-XL) 24 hr tablet 12.5 mg  12.5 mg Oral q morning - 10a Nolberto Hanlon, MD   12.5 mg at 09/09/19 0914  . mometasone-formoterol (DULERA) 200-5 MCG/ACT inhaler 2 puff  2 puff Inhalation BID Lorella Nimrod, MD   2 puff at 09/09/19 0918  . nitroGLYCERIN (NITROSTAT) SL tablet 0.4 mg  0.4 mg Sublingual Q5 min PRN Lorella Nimrod, MD      . ondansetron (ZOFRAN) tablet 4 mg  4 mg Oral Q6H PRN Lorella Nimrod, MD       Or  . ondansetron (ZOFRAN) injection 4 mg  4 mg Intravenous Q6H PRN Lorella Nimrod, MD      . pantoprazole (PROTONIX) EC tablet 40 mg  40 mg Oral BID Lorella Nimrod, MD   40 mg at 09/09/19 0915  . polyethylene glycol (MIRALAX / GLYCOLAX) packet 17 g  17 g Oral Daily PRN Lorella Nimrod, MD   17 g at 09/06/19 1800  . predniSONE (DELTASONE) tablet 40 mg  40 mg Oral Q breakfast Nolberto Hanlon, MD   40 mg at 09/09/19 0914  . sodium chloride flush (NS) 0.9 % injection 3 mL  3 mL Intravenous Q12H Lorella Nimrod, MD   3 mL at 09/09/19 0916  . vitamin B-12 (CYANOCOBALAMIN) tablet 3,000 mcg  3,000 mcg Oral Daily Lorella Nimrod, MD   3,000 mcg at 09/09/19 0915  . vitamin C (ASCORBIC ACID) tablet 500 mg  500 mg Oral Daily Lorella Nimrod, MD   500 mg at 09/09/19 0915     Discharge Medications: Please see discharge summary for a list of discharge medications.  Relevant Imaging Results:  Relevant Lab Results:   Additional Information Close contact for postive covid case  Marshell Garfinkel, RN

## 2019-09-10 LAB — GLUCOSE, CAPILLARY: Glucose-Capillary: 79 mg/dL (ref 70–99)

## 2019-09-10 LAB — BASIC METABOLIC PANEL
Anion gap: 7 (ref 5–15)
BUN: 33 mg/dL — ABNORMAL HIGH (ref 8–23)
CO2: 34 mmol/L — ABNORMAL HIGH (ref 22–32)
Calcium: 8.4 mg/dL — ABNORMAL LOW (ref 8.9–10.3)
Chloride: 95 mmol/L — ABNORMAL LOW (ref 98–111)
Creatinine, Ser: 1.09 mg/dL — ABNORMAL HIGH (ref 0.44–1.00)
GFR calc Af Amer: 55 mL/min — ABNORMAL LOW (ref >60)
GFR calc non Af Amer: 48 mL/min — ABNORMAL LOW (ref >60)
Glucose, Bld: 102 mg/dL — ABNORMAL HIGH (ref 70–99)
Potassium: 5 mmol/L (ref 3.5–5.1)
Sodium: 136 mmol/L (ref 135–145)

## 2019-09-10 LAB — CBC
HCT: 31.7 % — ABNORMAL LOW (ref 36.0–46.0)
Hemoglobin: 10.8 g/dL — ABNORMAL LOW (ref 12.0–15.0)
MCH: 30.4 pg (ref 26.0–34.0)
MCHC: 34.1 g/dL (ref 30.0–36.0)
MCV: 89.3 fL (ref 80.0–100.0)
Platelets: 213 10*3/uL (ref 150–400)
RBC: 3.55 MIL/uL — ABNORMAL LOW (ref 3.87–5.11)
RDW: 15.2 % (ref 11.5–15.5)
WBC: 7.2 10*3/uL (ref 4.0–10.5)
nRBC: 0 % (ref 0.0–0.2)

## 2019-09-10 NOTE — Progress Notes (Signed)
PROGRESS NOTE    Catherine Landry  [id number] DOB: 1935-10-25 DOA: 08/31/2019 PCP: Tracie Harrier, MD      Assessment & Plan:   Active Problems:   Respiratory failure with hypoxia ([place])   Palliative care by specialist   DNR (do not resuscitate) discussion   Goals of care, counseling/discussion   Acute on chronic hypoxic & hyercarbic respiratory failure: back to baseline. Likely secondary to COVID19 vs bacterial pneumonia. Although COVID-19 test is negative,pt is high risk due to sick contact as her husband recently diagnosed with COVID-19 pneumonia. Continue on vitamin c. Continue on airborne & contact precautions. Completed abx course 09/08/19 and completed course of steroids. Off of supplemental oxygen today.   Pneumonia: secondary to COVID19 vs bacterial. Possibly gram positive or gram neg. Completed abx course. Continue on bronchodilators. Completed steroid course  COPD exacerbation: end stage. Continue on bronchodilators. Uses supplemental oxygen 2L [place] at home but currently off of oxygen. Pulmon recs apprec  AKI: baseline Cr is unknown. Cr is stable. Will continue to monitor   CAD: Recent right and left cardiac catheterization, EF of 45- 50%,and 100% stenosis of proximal circumflex, 99% stenosis of RCA.High risk for PCI and was advised to continue with dual antiplatelets. Mildly elevated troponin likely secondary to demand ischemia. No chest pain. Continue on decreased dose of beta blocker. Continue on tele.  Acute on chronic diastolic CHF: will continue on po lasix. Strict I/Os. Neg fluid balance  Generalized weakness: PT recs SNF but pt wants to go to ALF. CM is aware   DVT prophylaxis: lovenox Code Status:  DNR Disposition Plan:  stable for d/c.  PT recs SNF but pt wants to go to ALF. Palliative care f/u outpatient as well    Consultants:   Pulmon  Palliative care   Procedures:  Antimicrobials: completed abx course  Subjective: Pt c/o  fatigue  Objective: Vitals:   09/09/19 1552 09/09/19 2033 09/10/19 0333 09/10/19 0425  BP:  117/71 136/73 132/87  Pulse:  72 72 79  Resp:  20 18 20   Temp:  97.9 F (36.6 C) 98 F (36.7 C) 98 F (36.7 C)  TempSrc:  Oral Oral Oral  SpO2: 93% 93% 99% 97%  Weight:   57.4 kg   Height:        Intake/Output Summary (Last 24 hours) at 09/10/2019 0754 Last data filed at 09/10/2019 0335 Gross per 24 hour  Intake --  Output 1400 ml  Net -1400 ml   Filed Weights   09/08/19 0443 09/09/19 0526 09/10/19 0333  Weight: 56.2 kg 57.1 kg 57.4 kg    Examination:  General exam: Appears calm and comfortable  Respiratory system: diminished breath sounds b/l especially at the bases. No rales, wheezes Cardiovascular system: S1 & S2 normal. No rubs, gallops Gastrointestinal system: Abdomen is nondistended, soft and nontender. Normal bowel sounds heard. CNS: alert and oriented. Moves all 4 extremities Psychiatry: Judgement and insight appear normal. normal mood and affect      Data Reviewed: I have personally reviewed following labs and imaging studies  CBC: Recent Labs  Lab 09/06/19 0455 09/07/19 0535 09/08/19 0721 09/09/19 0500 09/10/19 0538  WBC 5.9 6.0 6.6 6.4 7.2  HGB 10.5* 10.7* 10.6* 10.7* 10.8*  HCT 32.9* 31.4* 31.2* 31.5* 31.7*  MCV 93.7 88.2 87.9 89.0 89.3  PLT 198 212 217 213 [id number]   Basic Metabolic Panel: Recent Labs  Lab 09/06/19 0455 09/07/19 0535 09/08/19 0721 09/09/19 0500 09/10/19 0538  NA 136 135 140 136  136  K 3.5 3.9 3.8 4.4 5.0  CL 90* 89* 95* 94* 95*  CO2 37* 36* 35* 33* 34*  GLUCOSE 129* 97 89 110* 102*  BUN 36* 32* 29* 29* 33*  CREATININE 1.26* 1.16* 1.14* 1.06* 1.09*  CALCIUM 7.6* 7.7* 8.0* 7.9* 8.4*   GFR: Estimated Creatinine Clearance: 32 mL/min (A) (by C-G formula based on SCr of 1.09 mg/dL (H)). Liver Function Tests: No results for input(s): AST, ALT, ALKPHOS, BILITOT, PROT, ALBUMIN in the last 168 hours. No results for input(s): LIPASE,  AMYLASE in the last 168 hours. No results for input(s): AMMONIA in the last 168 hours. Coagulation Profile: No results for input(s): INR, PROTIME in the last 168 hours. Cardiac Enzymes: No results for input(s): CKTOTAL, CKMB, CKMBINDEX, TROPONINI in the last 168 hours. BNP (last 3 results) No results for input(s): PROBNP in the last 8760 hours. HbA1C: No results for input(s): HGBA1C in the last 72 hours. CBG: Recent Labs  Lab 09/08/19 0827 09/08/19 1225 09/08/19 1621 09/09/19 0929 09/09/19 1152  GLUCAP 88 132* 213* 87 135*   Lipid Profile: No results for input(s): CHOL, HDL, LDLCALC, TRIG, CHOLHDL, LDLDIRECT in the last 72 hours. Thyroid Function Tests: No results for input(s): TSH, T4TOTAL, FREET4, T3FREE, THYROIDAB in the last 72 hours. Anemia Panel: No results for input(s): VITAMINB12, FOLATE, FERRITIN, TIBC, IRON, RETICCTPCT in the last 72 hours. Sepsis Labs: No results for input(s): PROCALCITON, LATICACIDVEN in the last 168 hours.  Recent Results (from the past 240 hour(s))  Respiratory Panel by RT PCR (Flu A&B, Covid) - Nasopharyngeal Swab     Status: None   Collection Time: 08/31/19  4:36 PM   Specimen: Nasopharyngeal Swab  Result Value Ref Range Status   SARS Coronavirus 2 by RT PCR NEGATIVE NEGATIVE Final    Comment: (NOTE) SARS-CoV-2 target nucleic acids are NOT DETECTED. The SARS-CoV-2 RNA is generally detectable in upper respiratoy specimens during the acute phase of infection. The lowest concentration of SARS-CoV-2 viral copies this assay can detect is 131 copies/mL. A negative result does not preclude SARS-Cov-2 infection and should not be used as the sole basis for treatment or other patient management decisions. A negative result may occur with  improper specimen collection/handling, submission of specimen other than nasopharyngeal swab, presence of viral mutation(s) within the areas targeted by this assay, and inadequate number of viral copies (<131  copies/mL). A negative result must be combined with clinical observations, patient history, and epidemiological information. The expected result is Negative. Fact Sheet for Patients:  PinkCheek.be Fact Sheet for Healthcare Providers:  GravelBags.it This test is not yet ap proved or cleared by the [place] FDA and  has been authorized for detection and/or diagnosis of SARS-CoV-2 by FDA under an Emergency Use Authorization (EUA). This EUA will remain  in effect (meaning this test can be used) for the duration of the COVID-19 declaration under Section 564(b)(1) of the Act, 21 U.S.C. section 360bbb-3(b)(1), unless the authorization is terminated or revoked sooner.    Influenza A by PCR NEGATIVE NEGATIVE Final   Influenza B by PCR NEGATIVE NEGATIVE Final    Comment: (NOTE) The Xpert Xpress SARS-CoV-2/FLU/RSV assay is intended as an aid in  the diagnosis of influenza from Nasopharyngeal swab specimens and  should not be used as a sole basis for treatment. Nasal washings and  aspirates are unacceptable for Xpert Xpress SARS-CoV-2/FLU/RSV  testing. Fact Sheet for Patients: PinkCheek.be Fact Sheet for Healthcare Providers: GravelBags.it This test is not yet approved or cleared  by the [place] and  has been authorized for detection and/or diagnosis of SARS-CoV-2 by  FDA under an Emergency Use Authorization (EUA). This EUA will remain  in effect (meaning this test can be used) for the duration of the  Covid-19 declaration under Section 564(b)(1) of the Act, 21  U.S.C. section 360bbb-3(b)(1), unless the authorization is  terminated or revoked. Performed at [facility], [place], [place] [address]   SARS CORONAVIRUS 2 (TAT 6-24 HRS) Nasopharyngeal Nasopharyngeal Swab     Status: None   Collection Time: 08/31/19  6:52 PM   Specimen:  Nasopharyngeal Swab  Result Value Ref Range Status   SARS Coronavirus 2 NEGATIVE NEGATIVE Final    Comment: (NOTE) SARS-CoV-2 target nucleic acids are NOT DETECTED. The SARS-CoV-2 RNA is generally detectable in upper and lower respiratory specimens during the acute phase of infection. Negative results do not preclude SARS-CoV-2 infection, do not rule out co-infections with other pathogens, and should not be used as the sole basis for treatment or other patient management decisions. Negative results must be combined with clinical observations, patient history, and epidemiological information. The expected result is Negative. Fact Sheet for Patients: SugarRoll.be Fact Sheet for Healthcare Providers: https://www.woods-mathews.com/ This test is not yet approved or cleared by the [place] FDA and  has been authorized for detection and/or diagnosis of SARS-CoV-2 by FDA under an Emergency Use Authorization (EUA). This EUA will remain  in effect (meaning this test can be used) for the duration of the COVID-19 declaration under Section 56 4(b)(1) of the Act, 21 U.S.C. section 360bbb-3(b)(1), unless the authorization is terminated or revoked sooner. Performed at [facility], [place] [address], [place], [place] [address]   MRSA PCR Screening     Status: None   Collection Time: 09/04/19  4:53 PM   Specimen: Nasopharyngeal  Result Value Ref Range Status   MRSA by PCR NEGATIVE NEGATIVE Final    Comment:        The GeneXpert MRSA Assay (FDA approved for NASAL specimens only), is one component of a comprehensive MRSA colonization surveillance program. It is not intended to diagnose MRSA infection nor to guide or monitor treatment for MRSA infections. Performed at [facility], [place], [place] [address]   SARS CORONAVIRUS 2 (TAT 6-24 HRS) Nasopharyngeal Nasopharyngeal Swab     Status: None   Collection Time:  09/09/19  1:53 PM   Specimen: Nasopharyngeal Swab  Result Value Ref Range Status   SARS Coronavirus 2 NEGATIVE NEGATIVE Final    Comment: (NOTE) SARS-CoV-2 target nucleic acids are NOT DETECTED. The SARS-CoV-2 RNA is generally detectable in upper and lower respiratory specimens during the acute phase of infection. Negative results do not preclude SARS-CoV-2 infection, do not rule out co-infections with other pathogens, and should not be used as the sole basis for treatment or other patient management decisions. Negative results must be combined with clinical observations, patient history, and epidemiological information. The expected result is Negative. Fact Sheet for Patients: SugarRoll.be Fact Sheet for Healthcare Providers: https://www.woods-mathews.com/ This test is not yet approved or cleared by the [place] FDA and  has been authorized for detection and/or diagnosis of SARS-CoV-2 by FDA under an Emergency Use Authorization (EUA). This EUA will remain  in effect (meaning this test can be used) for the duration of the COVID-19 declaration under Section 56 4(b)(1) of the Act, 21 U.S.C. section 360bbb-3(b)(1), unless the authorization is terminated or revoked sooner. Performed  at [facility], [place] [address], [place], [place] [address]          Radiology Studies: DG Chest Port 1 View  Result Date: 09/08/2019 CLINICAL DATA:  Pneumonia. EXAM: PORTABLE CHEST 1 VIEW COMPARISON:  September 04, 2019. FINDINGS: The heart size and mediastinal contours are within normal limits. No pneumothorax or pleural effusion is noted. Minimal bibasilar subsegmental atelectasis or scarring is noted. Atherosclerosis of thoracic aorta is noted. The visualized skeletal structures are unremarkable. IMPRESSION: Aortic atherosclerosis. Minimal bibasilar subsegmental atelectasis or scarring. Electronically Signed   By: Marijo Conception M.D.   On: 09/08/2019  09:10        Scheduled Meds: . aspirin EC  81 mg Oral q morning - 10a  . calcium-vitamin D  2 tablet Oral Daily  . clopidogrel  75 mg Oral Daily  . enoxaparin (LOVENOX) injection  40 mg Subcutaneous Q24H  . fluticasone  2 spray Each Nare Daily  . furosemide  20 mg Oral BID  . influenza vaccine adjuvanted  0.5 mL Intramuscular Tomorrow-1000  . Ipratropium-Albuterol  1 puff Inhalation Q6H  . lidocaine  1 patch Transdermal Q24H  . mouth rinse  15 mL Mouth Rinse BID  . metoprolol succinate  12.5 mg Oral q morning - 10a  . mometasone-formoterol  2 puff Inhalation BID  . pantoprazole  40 mg Oral BID  . predniSONE  40 mg Oral Q breakfast  . sodium chloride flush  3 mL Intravenous Q12H  . vitamin B-12  3,000 mcg Oral Daily  . ascorbic acid  500 mg Oral Daily   Continuous Infusions: . sodium chloride 250 mL (09/06/19 2215)     LOS: 10 days    Time spent: 30 mins    Wyvonnia Dusky, MD [facility] Pager [phone]  If 7PM-7AM, please contact night-coverage [email or web address] Password [facility] 09/10/2019, 7:54 AM

## 2019-09-11 LAB — CBC
HCT: 33.7 % — ABNORMAL LOW (ref 36.0–46.0)
Hemoglobin: 11.5 g/dL — ABNORMAL LOW (ref 12.0–15.0)
MCH: 30.3 pg (ref 26.0–34.0)
MCHC: 34.1 g/dL (ref 30.0–36.0)
MCV: 88.9 fL (ref 80.0–100.0)
Platelets: 232 10*3/uL (ref 150–400)
RBC: 3.79 MIL/uL — ABNORMAL LOW (ref 3.87–5.11)
RDW: 15.1 % (ref 11.5–15.5)
WBC: 7.5 10*3/uL (ref 4.0–10.5)
nRBC: 0 % (ref 0.0–0.2)

## 2019-09-11 LAB — BASIC METABOLIC PANEL
Anion gap: 6 (ref 5–15)
BUN: 34 mg/dL — ABNORMAL HIGH (ref 8–23)
CO2: 36 mmol/L — ABNORMAL HIGH (ref 22–32)
Calcium: 8.5 mg/dL — ABNORMAL LOW (ref 8.9–10.3)
Chloride: 92 mmol/L — ABNORMAL LOW (ref 98–111)
Creatinine, Ser: 1.14 mg/dL — ABNORMAL HIGH (ref 0.44–1.00)
GFR calc Af Amer: 52 mL/min — ABNORMAL LOW (ref >60)
GFR calc non Af Amer: 45 mL/min — ABNORMAL LOW (ref >60)
Glucose, Bld: 101 mg/dL — ABNORMAL HIGH (ref 70–99)
Potassium: 4.7 mmol/L (ref 3.5–5.1)
Sodium: 134 mmol/L — ABNORMAL LOW (ref 135–145)

## 2019-09-11 LAB — GLUCOSE, CAPILLARY: Glucose-Capillary: 77 mg/dL (ref 70–99)

## 2019-09-11 MED ORDER — NEPRO/CARBSTEADY PO LIQD
237.0000 mL | Freq: Three times a day (TID) | ORAL | Status: DC
  Administered 2019-09-11: 237 mL via ORAL

## 2019-09-11 MED ORDER — ENSURE ENLIVE PO LIQD: 237.0000 mL | Freq: Three times a day (TID) | ORAL | Status: DC

## 2019-09-11 MED ORDER — METOPROLOL SUCCINATE ER 25 MG PO TB24: 12.5000 mg | ORAL_TABLET | Freq: Every morning | ORAL | 0 refills | Status: AC

## 2019-09-11 NOTE — Progress Notes (Signed)
Pt's brother called this evening, requested for pt to be allowed to stay in hospital one more night since he felt it would be too late to transfer pt to [place]. Spoke to pt directly and pt also desired to stay tonight, as she is concerned about "possible exposure". Facility called at 2130 to find out status of O2 delivery, Rip Harbour (med tech) stated that they were there at that moment setting up pt's equipment. RN informed Rip Harbour of pt's request to be transferred in AM since it was getting so late, she stated it would be fine either way. On call provider notified of pt & pt's brother's request, ok to allow pt to stay tonight. Will hold discharge orders on hold until AM, per on call provider (no written order, on call provider stated this via text response). Ranger back at 2220 to notify them to expect pt in AM, spoke to [place], who stated that this would be fine. Pt is calling her brother to notify him that she will remain in hospital tonight.

## 2019-09-11 NOTE — TOC Transition Note (Signed)
Transition of Care [facility]) - CM/SW Discharge Note   Patient Details  Name: Catherine Landry MRN: [id number] Date of Birth: 1936/08/15  [facility]) CM/SW Contact:  Candie Chroman, LCSW Phone Number: 09/11/2019, 4:44 PM   Clinical Narrative: Patient has orders to discharge home today. [employer] will deliver an oxygen tank to the facility today and the ALF will call the nurse when it arrives. RN will call report to [phone] prior to setting up EMS transport. No further concerns. CSW signing off.    Final next level of care: Assisted Living(with home health) Barriers to Discharge: Barriers Resolved   Patient Goals and CMS Choice     Choice offered to / list presented to : Patient, Sibling  Discharge Placement              Patient chooses bed at: Other - please specify in the comment section below:([facility]) Patient to be transferred to facility by: EMS Name of family member notified: Elicia Lamp Patient and family notified of of transfer: 09/11/19  Discharge Plan and Services     Post Acute Care Choice: [place]          DME Arranged: Oxygen DME Agency: [employer] Date DME Agency Contacted: 09/11/19   Representative spoke with at DME Agency: [place]: RN, PT [facility] Agency: [place] ([place]) Date [facility] Agency Contacted: 09/11/19   Representative spoke with at [place]: Floydene Flock  Social Determinants of Health (SDOH) Interventions     Readmission Risk Interventions Readmission Risk Prevention Plan 09/05/2019 04/30/2019  Transportation Screening - Complete  PCP or Specialist Appt within 5-7 Days - Complete  PCP or Specialist Appt within 3-5 Days Complete -  Home Care Screening - Complete  Medication Review (RN CM) - Complete  HRI or Home Care Consult Complete -  Social Work Consult for [place] Planning/Counseling Complete -  Palliative Care Screening Complete -  Some recent data might be  hidden

## 2019-09-11 NOTE — Discharge Summary (Addendum)
Physician Discharge Summary  Catherine Landry [id number] DOB: 22-Aug-1936 DOA: 08/31/2019  PCP: Tracie Harrier, MD  Admit date: 08/31/2019 Discharge date: 09/11/2019; discharge actual date 09/12/19  Admitted From: Home Disposition:  ALF  Recommendations for Outpatient Follow-up:  1. Follow up with PCP in 1-2 weeks 2. F/u w/ pulmon in 1 weeks  Home Health: Equipment/Devices: 2L [place] (previous home oxygen)  Discharge Condition: stable CODE STATUS: DNR Diet recommendation: Heart Healthy    Brief/Interim Summary: HPI taken from Dr. Reesa Chew:  Catherine Landry is a 83 y.o. female with medical history significant of Areyona Schallert Masseyis a 83 y.o.femalewith past medical history of COPD on 2 L nasal cannula, CHF, GERD, renal cancer status post left nephrectomy who presents to the ED complaining of shortness of breath.  Per patient she is experiencing worsening shortness of breath spite using her home oxygen.  Her husband recently tested positive for COVID-19 and had similar symptoms.  She was brought to ED today for further evaluation.  Patient was complaining of worsening cough and shortness of breath for few days.  She was complaining of subjective fever and chills.  Having nausea, vomiting and diarrhea.  Diarrhea resolved today but she continued to experience nausea.  Complaining of headache.  Denies any change in her sense of smell or taste.  Denies any recent change in her weight or swelling.  She denies any orthopnea or PND.  Denies any chest pain.  No urinary symptoms.  ED Course: On arrival to ED patient was mildly tachycardic, tachypneic and saturating in 60s with 2 L.  She was placed on nonrebreather which improved her saturation.  Later transitioned to high flow nasal cannula.  Initial POC COVID-19 testing was negative but chest x-ray shows bilateral infiltrate consistent with COVID-19 pneumonia.  Respiratory viral panel negative. D-dimer was elevated at 1726.  Ferritin within  normal limit, BNP mildly elevated at 273.  Troponin positive at 29.   Hospital Course from Dr. Lenise Herald: Pt presented w/ acute on chronic respiratory failure likely secondary to COVID19 and bacterial pneumonia. Pt tested neg for COVID19 while in the hospital but had a close contact w/ her husband who tested positive for [place] and was inpatient receiving treatment. Pt was also treated w/ an abx and steroid course. The supplemental oxygen was weaned back to pt's baseline of 2L [place] prior to d/c. Of note, pt had mildly elevated troponins which was likely secondary to demand ischemia. Furthermore, pt was treated for acute on chronic diastolic CHF exacerbation w/ lasix. Pt responded appropriately to treatment.   Discharge Diagnoses:  Active Problems:   Respiratory failure with hypoxia ([place])   Palliative care by specialist   DNR (do not resuscitate) discussion   Goals of care, counseling/discussion Acute on chronic hypoxic & hyercarbic respiratory failure: back to baseline. Likely secondary to COVID19 vs bacterial pneumonia. Although COVID-19 test is negative,pt is high risk due to sick contact as her husband recently diagnosed with COVID-19 pneumonia. Continue on vitamin c. Continue on airborne & contact precautions. Completed abx course 09/08/19 and completed course of steroids. Continue on supplemental oxygen at baseline, 2L [place]  Pneumonia: secondary to COVID19 vs bacterial. Possibly gram positive or gram neg. Completed abx & steroid course. Continue on bronchodilators.   COPD exacerbation: end stage. Continue on bronchodilators. Uses supplemental oxygen 2L [place]. Pulmon recs apprec  AKI: baseline Cr is unknown. Cr is labile. Will continue to monitor   CAD: Recent right and left cardiac catheterization, EF of 45- 50%,and  100% stenosis of proximal circumflex, 99% stenosis of RCA.High risk for PCI and was advised to continue with dual antiplatelets. Mildly elevated troponin likely secondary to  demand ischemia. No chest pain. Continue on decreased dose of beta blocker. Continue on tele.  Acute on chronic diastolic CHF: will continue on po lasix. Strict I/Os. Neg fluid balance  Generalized weakness: PT recs SNF but pt wants to go to ALF. Pt needs to be evaluated before going to [facility] as per CM.     Discharge Instructions  Discharge Instructions    Diet - low sodium heart healthy   Complete by: As directed    Discharge instructions   Complete by: As directed    F/U w/ PCP in 1-2 weeks; F/u Pulmon in 1 week   Increase activity slowly   Complete by: As directed      Allergies as of 09/11/2019      Reactions   Nitrofurantoin Diarrhea, Other (See Comments)   Headache      Medication List    STOP taking these medications   amoxicillin-clavulanate 875-125 MG tablet Commonly known as: AUGMENTIN     TAKE these medications   acetaminophen 650 MG CR tablet Commonly known as: TYLENOL Take 650 mg by mouth every 8 (eight) hours as needed for pain.   ascorbic acid 500 MG tablet Commonly known as: VITAMIN C Take 1 tablet by mouth daily.   aspirin EC 81 MG tablet Take 81 mg by mouth every morning.   busPIRone 7.5 MG tablet Commonly known as: BUSPAR Take 7.5 mg by mouth 2 (two) times daily.   Calcium 600+D Plus Minerals 600-400 MG-UNIT Tabs Take 2 tablets by mouth daily.   clopidogrel 75 MG tablet Commonly known as: Plavix Take 1 tablet (75 mg total) by mouth daily.   Combivent Respimat 20-100 MCG/ACT Aers respimat Generic drug: Ipratropium-Albuterol Inhale 1 puff into the lungs 4 (four) times daily as needed for wheezing or shortness of breath.   fluticasone 50 MCG/ACT nasal spray Commonly known as: FLONASE Place 2 sprays into both nostrils daily.   Fluticasone-Salmeterol 250-50 MCG/DOSE Aepb Commonly known as: ADVAIR Inhale 1 puff into the lungs 2 (two) times daily.   furosemide 20 MG tablet Commonly known as: LASIX Take 20 mg by mouth daily.    iron polysaccharides 150 MG capsule Commonly known as: NIFEREX Take 150 mg by mouth 2 (two) times daily.   levalbuterol 0.63 MG/3ML nebulizer solution Commonly known as: XOPENEX INHALE THE CONTENTS OF 1 VIAL IN NEBULIZER EVERY 6 HRS AS NEEDED FOR WHEEZING OR SHORTNESS OF BREATH   losartan 50 MG tablet Commonly known as: COZAAR Take 50 mg by mouth every morning.   metoprolol succinate 25 MG 24 hr tablet Commonly known as: TOPROL-XL Take 0.5 tablets (12.5 mg total) by mouth every morning. Start taking on: September 12, 2019 What changed: how much to take   multivitamin with minerals Tabs tablet Take 1 tablet by mouth every morning.   nitroGLYCERIN 0.4 MG SL tablet Commonly known as: NITROSTAT Place 0.4 mg under the tongue every 5 (five) minutes as needed for chest pain.   pantoprazole 40 MG tablet Commonly known as: PROTONIX Take 40 mg by mouth 2 (two) times daily.   polyethylene glycol powder 17 GM/SCOOP powder Commonly known as: GLYCOLAX/MIRALAX Take 17 g by mouth daily as needed for moderate constipation. Mix with 4 to 8 ounces of fluid.   theophylline 300 MG 12 hr tablet Commonly known as: THEODUR Take 300 mg  by mouth daily. What changed: Another medication with the same name was removed. Continue taking this medication, and follow the directions you see here.   vitamin B-12 1000 MCG tablet Commonly known as: CYANOCOBALAMIN Take 3,000 mcg by mouth daily.      Follow-up Information    Tracie Harrier, MD Follow up.   Specialty: Internal Medicine Why: Please schedule hospital follow up appt within 5-7 days after discharge. Contact information: [place] [address] [phone]          Allergies  Allergen Reactions  . Nitrofurantoin Diarrhea and Other (See Comments)    Headache    Consultations:  n/a   Procedures/Studies: DG Chest 1 View  Result Date: 09/04/2019 CLINICAL DATA:  Shortness of breath  EXAM: CHEST  1 VIEW COMPARISON:  08/31/2019 FINDINGS: The heart size and mediastinal contours are stable. Minimal hazy opacity within both lungs most notable in the right upper lobe and left lower lobe without significant interval progression from prior. No pleural effusion or pneumothorax. IMPRESSION: Minimal hazy opacities without significant interval progression from prior. Electronically Signed   By: Davina Poke M.D.   On: 09/04/2019 16:39   CT Angio Chest PE W/Cm &/Or Wo Cm  Result Date: 08/31/2019 CLINICAL DATA:  Shortness of breath with COVID-19 exposure EXAM: CT ANGIOGRAPHY CHEST WITH CONTRAST TECHNIQUE: Multidetector CT imaging of the chest was performed using the standard protocol during bolus administration of intravenous contrast. Multiplanar CT image reconstructions and MIPs were obtained to evaluate the vascular anatomy. CONTRAST:  82mL OMNIPAQUE IOHEXOL 350 MG/ML SOLN COMPARISON:  Chest x-ray from earlier in the same day. FINDINGS: Cardiovascular: Thoracic aorta demonstrates atherosclerotic calcifications without aneurysmal dilatation or dissection. Coronary calcifications are seen. No significant cardiac enlargement is noted. The pulmonary artery shows a normal branching pattern. No intraluminal filling defect to suggest pulmonary embolism is identified. Mediastinum/Nodes: Thoracic inlet is within normal limits. No sizable hilar or mediastinal adenopathy is noted. Scattered small nodes are the subcarinal region as well as the AP window. The esophagus as visualized appears within normal limits. Lungs/Pleura: Diffuse emphysematous changes are noted. Patchy ground-glass infiltrates are noted. Given the patient's clinical history this is highly suggestive of COVID-19 pneumonia. Stable left upper lobe nodule is noted best seen on image number 15 of series 6 unchanged from 2017. A nodule is also seen in the lateral aspect of the left upper lobe best seen on image number 27 of series 6 also stable  in appearance from the prior exam. Some areas of bronchial mucous plugging are noted. Stable calcifications are noted in the right upper lobe. No sizable effusion is seen. Upper Abdomen: Visualized upper abdomen shows no acute abnormality. Musculoskeletal: Degenerative changes of the thoracic spine are noted. No acute bony abnormality is seen. Review of the MIP images confirms the above findings. IMPRESSION: Patchy ground-glass infiltrates within the lungs bilaterally. Given the patient's recent exposure to COVID-19 this is highly suggestive of viral pneumonia. Scattered mediastinal lymph nodes are noted likely of a reactive nature. Stable scattered small nodules when compared with 2017 consistent with a benign etiology. No further follow-up is recommended. No evidence of pulmonary emboli. Aortic Atherosclerosis (ICD10-I70.0) and Emphysema (ICD10-J43.9). Electronically Signed   By: Inez Catalina M.D.   On: 08/31/2019 19:04   DG Chest Port 1 View  Result Date: 09/08/2019 CLINICAL DATA:  Pneumonia. EXAM: PORTABLE CHEST 1 VIEW COMPARISON:  September 04, 2019. FINDINGS: The heart size and mediastinal contours are within normal limits.  No pneumothorax or pleural effusion is noted. Minimal bibasilar subsegmental atelectasis or scarring is noted. Atherosclerosis of thoracic aorta is noted. The visualized skeletal structures are unremarkable. IMPRESSION: Aortic atherosclerosis. Minimal bibasilar subsegmental atelectasis or scarring. Electronically Signed   By: Marijo Conception M.D.   On: 09/08/2019 09:10   DG Chest Portable 1 View  Result Date: 08/31/2019 CLINICAL DATA:  Difficulty breathing today. Patient's husband is COVID-19 positive. EXAM: PORTABLE CHEST 1 VIEW COMPARISON:  April 27, 2019 FINDINGS: The mediastinal contour is normal. The heart size is upper limits of normal. Minimal increased pulmonary interstitium with minimal hazy ground-glass opacity is identified in the bilateral lungs. There is no pleural  effusion. The bony structures are stable. IMPRESSION: Minimal increased pulmonary interstitium with minimal hazy ground-glass opacity is identified in the bilateral lungs. This can be seen in early pneumonia. Electronically Signed   By: Abelardo Diesel M.D.   On: 08/31/2019 15:43      Subjective: Pt c/o fatigue.  Discharge Exam: Vitals:   09/11/19 0456 09/11/19 0752  BP: 113/68 130/84  Pulse: 73 75  Resp: 20 20  Temp: (!) 97.4 F (36.3 C) 97.7 F (36.5 C)  SpO2: 97% 98%   Vitals:   09/10/19 1453 09/10/19 2159 09/11/19 0456 09/11/19 0752  BP: 121/63 125/73 113/68 130/84  Pulse: (!) 46 78 73 75  Resp: 19 20 20 20   Temp: 97.9 F (36.6 C) 97.8 F (36.6 C) (!) 97.4 F (36.3 C) 97.7 F (36.5 C)  TempSrc: Oral Oral Oral Oral  SpO2: 90% 97% 97% 98%  Weight:   56 kg   Height:        General: Pt is alert, awake, not in acute distress Cardiovascular: S1/S2 +, no rubs, no gallops Respiratory: diminished breath sounds b/l, no wheezing, no rhonchi Abdominal: Soft, NT, ND, bowel sounds + Extremities: no edema, no cyanosis    The results of significant diagnostics from this hospitalization (including imaging, microbiology, ancillary and laboratory) are listed below for reference.     Microbiology: Recent Results (from the past 240 hour(s))  MRSA PCR Screening     Status: None   Collection Time: 09/04/19  4:53 PM   Specimen: Nasopharyngeal  Result Value Ref Range Status   MRSA by PCR NEGATIVE NEGATIVE Final    Comment:        The GeneXpert MRSA Assay (FDA approved for NASAL specimens only), is one component of a comprehensive MRSA colonization surveillance program. It is not intended to diagnose MRSA infection nor to guide or monitor treatment for MRSA infections. Performed at [facility], [place], [place] [address]   SARS CORONAVIRUS 2 (TAT 6-24 HRS) Nasopharyngeal Nasopharyngeal Swab     Status: None   Collection Time: 09/09/19  1:53 PM    Specimen: Nasopharyngeal Swab  Result Value Ref Range Status   SARS Coronavirus 2 NEGATIVE NEGATIVE Final    Comment: (NOTE) SARS-CoV-2 target nucleic acids are NOT DETECTED. The SARS-CoV-2 RNA is generally detectable in upper and lower respiratory specimens during the acute phase of infection. Negative results do not preclude SARS-CoV-2 infection, do not rule out co-infections with other pathogens, and should not be used as the sole basis for treatment or other patient management decisions. Negative results must be combined with clinical observations, patient history, and epidemiological information. The expected result is Negative. Fact Sheet for Patients: SugarRoll.be Fact Sheet for Healthcare Providers: https://www.woods-mathews.com/ This test is not yet approved or cleared by the [place] FDA  and  has been authorized for detection and/or diagnosis of SARS-CoV-2 by FDA under an Emergency Use Authorization (EUA). This EUA will remain  in effect (meaning this test can be used) for the duration of the COVID-19 declaration under Section 56 4(b)(1) of the Act, 21 U.S.C. section 360bbb-3(b)(1), unless the authorization is terminated or revoked sooner. Performed at [facility], [place] [address], [place], [place] [address]      Labs: BNP (last 3 results) Recent Labs    09/04/19 0526 09/05/19 0452 09/06/19 0455  BNP 1,053.0* 581.0* [id number]*   Basic Metabolic Panel: Recent Labs  Lab 09/07/19 0535 09/08/19 0721 09/09/19 0500 09/10/19 0538 09/11/19 0510  NA 135 140 136 136 134*  K 3.9 3.8 4.4 5.0 4.7  CL 89* 95* 94* 95* 92*  CO2 36* 35* 33* 34* 36*  GLUCOSE 97 89 110* 102* 101*  BUN 32* 29* 29* 33* 34*  CREATININE 1.16* 1.14* 1.06* 1.09* 1.14*  CALCIUM 7.7* 8.0* 7.9* 8.4* 8.5*   Liver Function Tests: No results for input(s): AST, ALT, ALKPHOS, BILITOT, PROT, ALBUMIN in the last 168 hours. No results for input(s): LIPASE,  AMYLASE in the last 168 hours. No results for input(s): AMMONIA in the last 168 hours. CBC: Recent Labs  Lab 09/07/19 0535 09/08/19 0721 09/09/19 0500 09/10/19 0538 09/11/19 0510  WBC 6.0 6.6 6.4 7.2 7.5  HGB 10.7* 10.6* 10.7* 10.8* 11.5*  HCT 31.4* 31.2* 31.5* 31.7* 33.7*  MCV 88.2 87.9 89.0 89.3 88.9  PLT 212 217 213 213 232   Cardiac Enzymes: No results for input(s): CKTOTAL, CKMB, CKMBINDEX, TROPONINI in the last 168 hours. BNP: Invalid input(s): POCBNP CBG: Recent Labs  Lab 09/08/19 1621 09/09/19 0929 09/09/19 1152 09/10/19 0854 09/11/19 0754  GLUCAP 213* 87 135* 79 77   D-Dimer No results for input(s): DDIMER in the last 72 hours. Hgb A1c No results for input(s): HGBA1C in the last 72 hours. Lipid Profile No results for input(s): CHOL, HDL, LDLCALC, TRIG, CHOLHDL, LDLDIRECT in the last 72 hours. Thyroid function studies No results for input(s): TSH, T4TOTAL, T3FREE, THYROIDAB in the last 72 hours.  Invalid input(s): FREET3 Anemia work up No results for input(s): VITAMINB12, FOLATE, FERRITIN, TIBC, IRON, RETICCTPCT in the last 72 hours. Urinalysis    Component Value Date/Time   COLORURINE YELLOW (A) 08/31/2019 1635   APPEARANCEUR HAZY (A) 08/31/2019 1635   APPEARANCEUR Cloudy (A) 08/14/2019 1021   LABSPEC 1.014 08/31/2019 1635   PHURINE 6.0 08/31/2019 1635   GLUCOSEU NEGATIVE 08/31/2019 1635   HGBUR NEGATIVE 08/31/2019 1635   BILIRUBINUR NEGATIVE 08/31/2019 1635   BILIRUBINUR Negative 08/14/2019 1021   KETONESUR NEGATIVE 08/31/2019 1635   PROTEINUR 30 (A) 08/31/2019 1635   NITRITE NEGATIVE 08/31/2019 1635   LEUKOCYTESUR NEGATIVE 08/31/2019 1635   Sepsis Labs Invalid input(s): PROCALCITONIN,  WBC,  LACTICIDVEN Microbiology Recent Results (from the past 240 hour(s))  MRSA PCR Screening     Status: None   Collection Time: 09/04/19  4:53 PM   Specimen: Nasopharyngeal  Result Value Ref Range Status   MRSA by PCR NEGATIVE NEGATIVE Final    Comment:         The GeneXpert MRSA Assay (FDA approved for NASAL specimens only), is one component of a comprehensive MRSA colonization surveillance program. It is not intended to diagnose MRSA infection nor to guide or monitor treatment for MRSA infections. Performed at [facility] [place], [place], [place] [address]   SARS CORONAVIRUS 2 (TAT 6-24 HRS)  Nasopharyngeal Nasopharyngeal Swab     Status: None   Collection Time: 09/09/19  1:53 PM   Specimen: Nasopharyngeal Swab  Result Value Ref Range Status   SARS Coronavirus 2 NEGATIVE NEGATIVE Final    Comment: (NOTE) SARS-CoV-2 target nucleic acids are NOT DETECTED. The SARS-CoV-2 RNA is generally detectable in upper and lower respiratory specimens during the acute phase of infection. Negative results do not preclude SARS-CoV-2 infection, do not rule out co-infections with other pathogens, and should not be used as the sole basis for treatment or other patient management decisions. Negative results must be combined with clinical observations, patient history, and epidemiological information. The expected result is Negative. Fact Sheet for Patients: SugarRoll.be Fact Sheet for Healthcare Providers: https://www.woods-mathews.com/ This test is not yet approved or cleared by the [place] FDA and  has been authorized for detection and/or diagnosis of SARS-CoV-2 by FDA under an Emergency Use Authorization (EUA). This EUA will remain  in effect (meaning this test can be used) for the duration of the COVID-19 declaration under Section 56 4(b)(1) of the Act, 21 U.S.C. section 360bbb-3(b)(1), unless the authorization is terminated or revoked sooner. Performed at [facility], [place] [address], [place], [place] [address]      Time coordinating discharge: Over 30 minutes  SIGNED:   Wyvonnia Dusky, MD  [facility] 09/11/2019, 3:33 PM Pager   If 7PM-7AM, please  contact night-coverage [email or web address] Password [email or web address]

## 2019-09-11 NOTE — Progress Notes (Signed)
PROGRESS NOTE    Catherine Landry  [id number] DOB: 01/24/1936 DOA: 08/31/2019 PCP: Tracie Harrier, MD      Assessment & Plan:   Active Problems:   Respiratory failure with hypoxia ([place])   Palliative care by specialist   DNR (do not resuscitate) discussion   Goals of care, counseling/discussion   Acute on chronic hypoxic & hyercarbic respiratory failure: back to baseline. Likely secondary to COVID19 vs bacterial pneumonia. Although COVID-19 test is negative,pt is high risk due to sick contact as her husband recently diagnosed with COVID-19 pneumonia. Continue on vitamin c. Continue on airborne & contact precautions. Completed abx course 09/08/19 and completed course of steroids. Continue on supplemental oxygen at baseline, 2L [place]  Pneumonia: secondary to COVID19 vs bacterial. Possibly gram positive or gram neg. Completed abx & steroid course. Continue on bronchodilators.   COPD exacerbation: end stage. Continue on bronchodilators. Uses supplemental oxygen 2L [place]. Pulmon recs apprec  AKI: baseline Cr is unknown. Cr is labile. Will continue to monitor   CAD: Recent right and left cardiac catheterization, EF of 45- 50%,and 100% stenosis of proximal circumflex, 99% stenosis of RCA.High risk for PCI and was advised to continue with dual antiplatelets. Mildly elevated troponin likely secondary to demand ischemia. No chest pain. Continue on decreased dose of beta blocker. Continue on tele.  Acute on chronic diastolic CHF: will continue on po lasix. Strict I/Os. Neg fluid balance  Generalized weakness: PT recs SNF but pt wants to go to ALF. [facility] needs [facility] as [facility].   DVT prophylaxis: lovenox Code Status:  DNR Disposition Plan:  stable for d/c.  PT recs SNF but pt wants to go to ALF. Palliative care f/u outpatient as well    Consultants:   Pulmon  Palliative care   Procedures:  Antimicrobials: completed abx & steroid  course  Subjective: Pt c/o malaise  Objective: Vitals:   09/10/19 0858 09/10/19 1453 09/10/19 2159 09/11/19 0456  BP: 131/77 121/63 125/73 113/68  Pulse: 76 (!) 46 78 73  Resp: 20 19 20 20   Temp: 98.2 F (36.8 C) 97.9 F (36.6 C) 97.8 F (36.6 C) (!) 97.4 F (36.3 C)  TempSrc: Oral Oral Oral Oral  SpO2: 98% 90% 97% 97%  Weight:    56 kg  Height:        Intake/Output Summary (Last 24 hours) at 09/11/2019 0734 Last data filed at 09/11/2019 0456 Gross per 24 hour  Intake --  Output 800 ml  Net -800 ml   Filed Weights   09/09/19 0526 09/10/19 0333 09/11/19 0456  Weight: 57.1 kg 57.4 kg 56 kg    Examination:  General exam: Appears calm and comfortable. Frail appearing  Respiratory system: decreased breath sounds b/l especially at the bases. No rhonchi Cardiovascular system: S1 & S2 normal. No rubs, gallops Gastrointestinal system: Abdomen is nondistended, soft and nontender. Normal bowel sounds heard. CNS: alert and oriented. Moves all 4 extremities Psychiatry: Judgement and insight appear normal. normal mood and affect      Data Reviewed: I have personally reviewed following labs and imaging studies  CBC: Recent Labs  Lab 09/07/19 0535 09/08/19 0721 09/09/19 0500 09/10/19 0538 09/11/19 0510  WBC 6.0 6.6 6.4 7.2 7.5  HGB 10.7* 10.6* 10.7* 10.8* 11.5*  HCT 31.4* 31.2* 31.5* 31.7* 33.7*  MCV 88.2 87.9 89.0 89.3 88.9  PLT 212 217 213 213 [id number]   Basic Metabolic Panel: Recent Labs  Lab 09/07/19 0535 09/08/19  [id number] 09/09/19 0500 09/10/19 0538 09/11/19 0510  NA 135 140 136 136 134*  K 3.9 3.8 4.4 5.0 4.7  CL 89* 95* 94* 95* 92*  CO2 36* 35* 33* 34* 36*  GLUCOSE 97 89 110* 102* 101*  BUN 32* 29* 29* 33* 34*  CREATININE 1.16* 1.14* 1.06* 1.09* 1.14*  CALCIUM 7.7* 8.0* 7.9* 8.4* 8.5*   GFR: Estimated Creatinine Clearance: 30.6 mL/min (A) (by C-G formula based on SCr of 1.14 mg/dL (H)). Liver Function Tests: No results for input(s): AST, ALT, ALKPHOS,  BILITOT, PROT, ALBUMIN in the last 168 hours. No results for input(s): LIPASE, AMYLASE in the last 168 hours. No results for input(s): AMMONIA in the last 168 hours. Coagulation Profile: No results for input(s): INR, PROTIME in the last 168 hours. Cardiac Enzymes: No results for input(s): CKTOTAL, CKMB, CKMBINDEX, TROPONINI in the last 168 hours. BNP (last 3 results) No results for input(s): PROBNP in the last 8760 hours. HbA1C: No results for input(s): HGBA1C in the last 72 hours. CBG: Recent Labs  Lab 09/08/19 1225 09/08/19 1621 09/09/19 0929 09/09/19 1152 09/10/19 0854  GLUCAP 132* 213* 87 135* 79   Lipid Profile: No results for input(s): CHOL, HDL, LDLCALC, TRIG, CHOLHDL, LDLDIRECT in the last 72 hours. Thyroid Function Tests: No results for input(s): TSH, T4TOTAL, FREET4, T3FREE, THYROIDAB in the last 72 hours. Anemia Panel: No results for input(s): VITAMINB12, FOLATE, FERRITIN, TIBC, IRON, RETICCTPCT in the last 72 hours. Sepsis Labs: No results for input(s): PROCALCITON, LATICACIDVEN in the last 168 hours.  Recent Results (from the past 240 hour(s))  MRSA PCR Screening     Status: None   Collection Time: 09/04/19  4:53 PM   Specimen: Nasopharyngeal  Result Value Ref Range Status   MRSA by PCR NEGATIVE NEGATIVE Final    Comment:        The GeneXpert MRSA Assay (FDA approved for NASAL specimens only), is one component of a comprehensive MRSA colonization surveillance program. It is not intended to diagnose MRSA infection nor to guide or monitor treatment for MRSA infections. Performed at [facility], [place], [place] [address]   SARS CORONAVIRUS 2 (TAT 6-24 HRS) Nasopharyngeal Nasopharyngeal Swab     Status: None   Collection Time: 09/09/19  1:53 PM   Specimen: Nasopharyngeal Swab  Result Value Ref Range Status   SARS Coronavirus 2 NEGATIVE NEGATIVE Final    Comment: (NOTE) SARS-CoV-2 target nucleic acids are NOT DETECTED. The  SARS-CoV-2 RNA is generally detectable in upper and lower respiratory specimens during the acute phase of infection. Negative results do not preclude SARS-CoV-2 infection, do not rule out co-infections with other pathogens, and should not be used as the sole basis for treatment or other patient management decisions. Negative results must be combined with clinical observations, patient history, and epidemiological information. The expected result is Negative. Fact Sheet for Patients: SugarRoll.be Fact Sheet for Healthcare Providers: https://www.woods-mathews.com/ This test is not yet approved or cleared by the [place] FDA and  has been authorized for detection and/or diagnosis of SARS-CoV-2 by FDA under an Emergency Use Authorization (EUA). This EUA will remain  in effect (meaning this test can be used) for the duration of the COVID-19 declaration under Section 56 4(b)(1) of the Act, 21 U.S.C. section 360bbb-3(b)(1), unless the authorization is terminated or revoked sooner. Performed at [facility], [place] [address], [place], [place] [address]          Radiology Studies: No results found.  Scheduled Meds: . aspirin EC  81 mg Oral q morning - 10a  . calcium-vitamin D  2 tablet Oral Daily  . clopidogrel  75 mg Oral Daily  . enoxaparin (LOVENOX) injection  40 mg Subcutaneous Q24H  . fluticasone  2 spray Each Nare Daily  . furosemide  20 mg Oral BID  . influenza vaccine adjuvanted  0.5 mL Intramuscular Tomorrow-1000  . Ipratropium-Albuterol  1 puff Inhalation Q6H  . lidocaine  1 patch Transdermal Q24H  . mouth rinse  15 mL Mouth Rinse BID  . metoprolol succinate  12.5 mg Oral q morning - 10a  . mometasone-formoterol  2 puff Inhalation BID  . pantoprazole  40 mg Oral BID  . sodium chloride flush  3 mL Intravenous Q12H  . vitamin B-12  3,000 mcg Oral Daily  . ascorbic acid  500 mg Oral Daily   Continuous  Infusions: . sodium chloride 250 mL (09/06/19 2215)     LOS: 11 days    Time spent: 30 mins    Wyvonnia Dusky, MD [facility] Pager [phone]  If 7PM-7AM, please contact night-coverage [email or web address] Password [facility] 09/11/2019, 7:34 AM

## 2019-09-11 NOTE — NC FL2 (Addendum)
[place] LEVEL OF CARE SCREENING TOOL     IDENTIFICATION  Patient Name: Catherine Landry Birthdate: [birth year] Sex: female Admission Date (Current Location): 08/31/2019  [place] and [place] Number:  Engineering geologist and Address:  [facility], [address], [place], [place] [address]      Provider Number: [id number]  Attending Physician Name and Address:  Wyvonnia Dusky, MD  Relative Name and Phone Number:       Current Level of Care: Hospital Recommended Level of Care: Assisted Living Facility(with PT, RN through [facility] Prior Approval Number:    Date Approved/Denied:   PASRR Number: [id number]  Discharge Plan: Other (Comment)(ALF with PT and RN through [facility]    Current Diagnoses: Patient Active Problem List   Diagnosis Date Noted  . Palliative care by specialist   . DNR (do not resuscitate) discussion   . Goals of care, counseling/discussion   . Multifocal pneumonia   . Essential hypertension   . AKI (acute kidney injury) ([place])   . Respiratory failure with hypoxia ([place]) 08/31/2019  . Respiratory failure, acute ([place]) 04/26/2019  . Elevated troponin 04/26/2019  . Personal history of bladder cancer 03/16/2018  . Acute on chronic respiratory failure with hypoxia ([place]) 11/23/2017  . UTI (lower urinary tract infection) 04/25/2016  . COPD exacerbation ([place]) 10/06/2015    Orientation RESPIRATION BLADDER Height & Weight     Self, Time, Situation, Place  O2(Nasal Canula 2 L) Continent Weight: 123 lb 7.3 oz (56 kg) Height:  5\' 2"  (157.5 cm)  BEHAVIORAL SYMPTOMS/MOOD NEUROLOGICAL BOWEL NUTRITION STATUS  (None) (None) Continent Diet(Heart Healthy)  AMBULATORY STATUS COMMUNICATION OF NEEDS Skin   Limited Assist Verbally Bruising                       Personal Care Assistance Level of Assistance              Functional Limitations Info  Sight, Hearing, Speech Sight Info: Adequate Hearing Info:  Adequate Speech Info: Adequate    SPECIAL CARE FACTORS FREQUENCY  PT (By licensed PT)     PT Frequency: 3 x week             Contractures Contractures Info: Not present    Additional Factors Info  Code Status, Allergies, Isolation Precautions Code Status Info: DNR Allergies Info: Nitrofurantoin          Current Medications (09/11/2019):  This is the current hospital active medication list Current Facility-Administered Medications  Medication Dose Route Frequency Provider Last Rate Last Admin  . 0.9 %  sodium chloride infusion   Intravenous PRN Nolberto Hanlon, MD 10 mL/hr at 09/06/19 2215 250 mL at 09/06/19 2215  . acetaminophen (TYLENOL) tablet 650 mg  650 mg Oral Q6H PRN Lorella Nimrod, MD   650 mg at 09/10/19 [id number]   Or  . acetaminophen (TYLENOL) suppository 650 mg  650 mg Rectal Q6H PRN Lorella Nimrod, MD      . aspirin EC tablet 81 mg  81 mg Oral q morning - 10a Lorella Nimrod, MD   81 mg at 09/11/19 0908  . calcium-vitamin D (OSCAL WITH D) 500-200 MG-UNIT per tablet 2 tablet  2 tablet Oral Daily Lorella Nimrod, MD   2 tablet at 09/11/19 0909  . clopidogrel (PLAVIX) tablet 75 mg  75 mg Oral Daily Lorella Nimrod, MD   75 mg at 09/11/19 0909  . enoxaparin (LOVENOX) injection 40 mg  40 mg Subcutaneous Q24H  Nolberto Hanlon, MD   40 mg at 09/11/19 [id number]  . fluticasone (FLONASE) 50 MCG/ACT nasal spray 2 spray  2 spray Each Nare Daily Lorella Nimrod, MD   2 spray at 09/11/19 0910  . furosemide (LASIX) tablet 20 mg  20 mg Oral BID Wyvonnia Dusky, MD   20 mg at 09/11/19 [id number]  . influenza vaccine adjuvanted (FLUAD) injection 0.5 mL  0.5 mL Intramuscular Tomorrow-1000 Nolberto Hanlon, MD      . Ipratropium-Albuterol (COMBIVENT) respimat 1 puff  1 puff Inhalation QID PRN Lorella Nimrod, MD   1 puff at 09/04/19 0513  . Ipratropium-Albuterol (COMBIVENT) respimat 1 puff  1 puff Inhalation Q6H Hallaji, Sheema M, RPH   1 puff at 09/11/19 0909  . lidocaine (LIDODERM) 5 % 1 patch  1 patch Transdermal  Q24H Blake Divine, MD   1 patch at 09/09/19 1550  . MEDLINE mouth rinse  15 mL Mouth Rinse BID Nolberto Hanlon, MD   15 mL at 09/11/19 0910  . metoprolol succinate (TOPROL-XL) 24 hr tablet 12.5 mg  12.5 mg Oral q morning - 10a Nolberto Hanlon, MD   12.5 mg at 09/11/19 0908  . mometasone-formoterol (DULERA) 200-5 MCG/ACT inhaler 2 puff  2 puff Inhalation BID Lorella Nimrod, MD   2 puff at 09/11/19 0909  . nitroGLYCERIN (NITROSTAT) SL tablet 0.4 mg  0.4 mg Sublingual Q5 min PRN Lorella Nimrod, MD      . ondansetron (ZOFRAN) tablet 4 mg  4 mg Oral Q6H PRN Lorella Nimrod, MD       Or  . ondansetron (ZOFRAN) injection 4 mg  4 mg Intravenous Q6H PRN Lorella Nimrod, MD      . pantoprazole (PROTONIX) EC tablet 40 mg  40 mg Oral BID Lorella Nimrod, MD   40 mg at 09/11/19 0908  . polyethylene glycol (MIRALAX / GLYCOLAX) packet 17 g  17 g Oral Daily PRN Lorella Nimrod, MD   17 g at 09/06/19 1800  . sodium chloride flush (NS) 0.9 % injection 3 mL  3 mL Intravenous Q12H Lorella Nimrod, MD   3 mL at 09/11/19 0910  . vitamin B-12 (CYANOCOBALAMIN) tablet 3,000 mcg  3,000 mcg Oral Daily Lorella Nimrod, MD   3,000 mcg at 09/11/19 0909  . vitamin C (ASCORBIC ACID) tablet 500 mg  500 mg Oral Daily Lorella Nimrod, MD   500 mg at 09/11/19 0908     Discharge Medications: STOP taking these medications   amoxicillin-clavulanate 875-125 MG tablet Commonly known as: AUGMENTIN     TAKE these medications   acetaminophen 650 MG CR tablet Commonly known as: TYLENOL Take 650 mg by mouth every 8 (eight) hours as needed for pain.   ascorbic acid 500 MG tablet Commonly known as: VITAMIN C Take 1 tablet by mouth daily.   aspirin EC 81 MG tablet Take 81 mg by mouth every morning.   busPIRone 7.5 MG tablet Commonly known as: BUSPAR Take 7.5 mg by mouth 2 (two) times daily.   Calcium 600+D Plus Minerals 600-400 MG-UNIT Tabs Take 2 tablets by mouth daily.   clopidogrel 75 MG tablet Commonly known as: Plavix Take 1  tablet (75 mg total) by mouth daily.   Combivent Respimat 20-100 MCG/ACT Aers respimat Generic drug: Ipratropium-Albuterol Inhale 1 puff into the lungs 4 (four) times daily as needed for wheezing or shortness of breath.   fluticasone 50 MCG/ACT nasal spray Commonly known as: FLONASE Place 2 sprays into both nostrils daily.   Fluticasone-Salmeterol 250-50 MCG/DOSE Aepb  Commonly known as: ADVAIR Inhale 1 puff into the lungs 2 (two) times daily.   furosemide 20 MG tablet Commonly known as: LASIX Take 20 mg by mouth daily.   iron polysaccharides 150 MG capsule Commonly known as: NIFEREX Take 150 mg by mouth 2 (two) times daily.   levalbuterol 0.63 MG/3ML nebulizer solution Commonly known as: XOPENEX INHALE THE CONTENTS OF 1 VIAL IN NEBULIZER EVERY 6 HRS AS NEEDED FOR WHEEZING OR SHORTNESS OF BREATH   losartan 50 MG tablet Commonly known as: COZAAR Take 50 mg by mouth every morning.   metoprolol succinate 25 MG 24 hr tablet Commonly known as: TOPROL-XL Take 0.5 tablets (12.5 mg total) by mouth every morning. Start taking on: September 12, 2019 What changed: how much to take   multivitamin with minerals Tabs tablet Take 1 tablet by mouth every morning.   nitroGLYCERIN 0.4 MG SL tablet Commonly known as: NITROSTAT Place 0.4 mg under the tongue every 5 (five) minutes as needed for chest pain.   pantoprazole 40 MG tablet Commonly known as: PROTONIX Take 40 mg by mouth 2 (two) times daily.   polyethylene glycol powder 17 GM/SCOOP powder Commonly known as: GLYCOLAX/MIRALAX Take 17 g by mouth daily as needed for moderate constipation. Mix with 4 to 8 ounces of fluid.   theophylline 300 MG 12 hr tablet Commonly known as: THEODUR Take 300 mg by mouth daily. What changed: Another medication with the same name was removed. Continue taking this medication, and follow the directions you see here.   vitamin B-12 1000 MCG tablet Commonly known as:  CYANOCOBALAMIN Take 3,000 mcg by mouth daily.     Relevant Imaging Results:  Relevant Lab Results:   Additional Information SS#:  [id number]  Candie Chroman, LCSW

## 2019-09-11 NOTE — TOC Progression Note (Addendum)
Transition of Care [facility]) - Progression Note    Patient Details  Name: Catherine Landry MRN: [id number] Date of Birth: 1935/10/26  Transition of Care [facility]) CM/SW [employer], LCSW Phone Number: 09/11/2019, 1:08 PM  Clinical Narrative: CSW spoke to [place] at [facility]F. Faxed clinicals for to review to confirm they can take her. They are also waiting on paperwork to be completed by patient's brother.    4:29 pm: Patient has been approved by [facility] and she can discharge there today. Patient's niece will bring her nebulizer machine to the facility but cannot bring her oxygen concentrator. [employer] is checking to see if they can get oxygen to the facility this afternoon.  Expected Discharge Plan: Skilled Nursing Facility Barriers to Discharge: Continued Medical Work up  Expected Discharge Plan and Services Expected Discharge Plan: [place] Choice: [place] arrangements for the past 2 months: Single Family Home                                       Social Determinants of Health (SDOH) Interventions    Readmission Risk Interventions Readmission Risk Prevention Plan 09/05/2019 04/30/2019  Transportation Screening - Complete  PCP or Specialist Appt within 5-7 Days - Complete  PCP or Specialist Appt within 3-5 Days Complete -  Home Care Screening - Complete  Medication Review (RN CM) - Complete  HRI or Home Care Consult Complete -  Social Work Consult for Recovery Care Planning/Counseling Complete -  Palliative Care Screening Complete -  Some recent data might be hidden

## 2019-09-11 NOTE — Progress Notes (Signed)
[place] and spoke to Opal. She stated the oxygen will be delivered between 7-9pm tonight. This RN gave report on patient.

## 2019-09-11 NOTE — Progress Notes (Signed)
Physical Therapy Treatment Patient Details Name: Catherine Landry MRN: [id number] DOB: December 30, 1935 Today's Date: 09/11/2019    History of Present Illness Catherine Landry is an 83 yo female admitted after presenting to ED with complaints of weakness and SOB, on arrival to ED tachycardic, tachypneic and saturating in 60s on 2L, PMH includes COPD, CHF, GERD, renal cancer s/p left nephrectomy. recently admitted few months ago for hypoxia.    PT Comments    Pt in bed upon entry, expresses enthusiasm regarding prospect of participating in a physical therapy treatment session this date. Bed mobility performed at modI level, transfers at supervision, still appearing weak, but pt does well to utilize RW to improve independence. AMB is still limited to 93ft prior to onset to weakness of legs. Pt is able to progress to three efforts of AMB with good tolerance. Pt left up in chair at end of session.       Follow Up Recommendations  SNF;Supervision for mobility/OOB     Equipment Recommendations  Rolling walker with 5" wheels    Recommendations for Other Services       Precautions / Restrictions Precautions Precautions: Fall Restrictions Weight Bearing Restrictions: No    Mobility  Bed Mobility Overal bed mobility: Needs Assistance Bed Mobility: Supine to Sit     Supine to sit: Supervision        Transfers Overall transfer level: Needs assistance Equipment used: Rolling walker (2 wheeled) Transfers: Sit to/from Stand Sit to Stand: Supervision Stand pivot transfers: Supervision          Ambulation/Gait Ambulation/Gait assistance: Min guard Gait Distance (Feet): 50 Feet Assistive device: Rolling walker (2 wheeled) Gait Pattern/deviations: Step-to pattern     General Gait Details: 3pt gait with RW   Stairs             Wheelchair Mobility    Modified Rankin (Stroke Patients Only)       Balance Overall balance assessment: Modified Independent;No apparent  balance deficits (not formally assessed)                                          Cognition Arousal/Alertness: Awake/alert Behavior During Therapy: WFL for tasks assessed/performed Overall Cognitive Status: Within Functional Limits for tasks assessed                                        Exercises Other Exercises Other Exercises: STS from chair 1x5 Other Exercises: AMB 3x44ft, seated rest between    General Comments        Pertinent Vitals/Pain Pain Assessment: No/denies pain    Home Living                      Prior Function            PT Goals (current goals can now be found in the care plan section) Acute Rehab PT Goals Patient Stated Goal: get stronger PT Goal Formulation: With patient Time For Goal Achievement: 08/25/2019 Progress towards PT goals: Progressing toward goals    Frequency    Min 2X/week      PT Plan Current plan remains appropriate    Co-evaluation              AM-PAC PT "6 Clicks" Mobility   Outcome Measure  Help needed turning from your back to your side while in a flat bed without using bedrails?: None Help needed moving from lying on your back to sitting on the side of a flat bed without using bedrails?: None Help needed moving to and from a bed to a chair (including a wheelchair)?: A Little Help needed standing up from a chair using your arms (e.g., wheelchair or bedside chair)?: A Little Help needed to walk in hospital room?: A Little Help needed climbing 3-5 steps with a railing? : A Little 6 Click Score: 20    End of Session Equipment Utilized During Treatment: Oxygen Activity Tolerance: Patient limited by fatigue;Patient tolerated treatment well Patient left: in chair;with call bell/phone within reach;with chair alarm set Nurse Communication: Mobility status PT Visit Diagnosis: Muscle weakness (generalized) (M62.81);Difficulty in walking, not elsewhere classified (R26.2)      Time: [id number] PT Time Calculation (min) (ACUTE ONLY): 23 min  Charges:  $Therapeutic Exercise: 23-37 mins                     12:52 PM, 09/11/19 Catherine Landry, PT, DPT Physical Therapist - [facility]  [phone] (Ewa Gentry)    Catherine Landry C 09/11/2019, 12:49 PM

## 2019-09-11 NOTE — Progress Notes (Addendum)
Initial Nutrition Assessment  DOCUMENTATION CODES:   Not applicable  INTERVENTION:   Nepro Shake po TID, each supplement provides 425 kcal and 19 grams protein  Magic cup TID with meals, each supplement provides 290 kcal and 9 grams of protein  Liberalize diet   Bowel regimen as needed per MD  NUTRITION DIAGNOSIS:   Increased nutrient needs related to chronic illness(COPD) as evidenced by increased estimated needs.  GOAL:   Patient will meet greater than or equal to 90% of their needs  MONITOR:   PO intake, Supplement acceptance, Labs, Weight trends, Skin, I & O's  REASON FOR ASSESSMENT:   LOS    ASSESSMENT:   83 y/o female with h/o ESCOPD, CHF, GERD, renal cancer s/p left nephrectomy admitted with possible COVID 19 PNA   Pt familiar to nutrition department and this RD from multiple previous admits. Pt with increased estimated needs r/t COPD and possible COVID 19. Pt with decreased appetite and oral intake in hospital. RD will add supplements and liberalize pt's diet to help pt meet her estimated needs. Pt likes butter pecan flavor; will order Nepro. Pt can also have Ensure if she prefers. Per chart, pt is down 11lbs(8%) over the past 4 months; this is significant. Pt has lost ~8lbs while in hospital. Pt noted to have type 1 BM today; recommend bowel regimen as needed per MD.    Pt at high risk for developing malnutrition. Palliative care consult pending.   Medications reviewed and include: aspirin, oscal w/ D, plavix, lovenox, lasix, protonix, B12, vitamin C  Labs reviewed: Na 134(L), BUN 34(H), creat 1.14(H)  Unable to complete Nutrition-Focused physical exam at this time as pt with COVID 19.   Diet Order:   Diet Order            Diet regular Room service appropriate? Yes; Fluid consistency: Thin  Diet effective now             EDUCATION NEEDS:   No education needs have been identified at this time  Skin:  Skin Assessment: Reviewed RN  Assessment(Ecchymosis)  Last BM:  12/21- TYPE 1  Height:   Ht Readings from Last 1 Encounters:  08/31/19 5\' 2"  (1.575 m)    Weight:   Wt Readings from Last 1 Encounters:  09/11/19 56 kg    Ideal Body Weight:  50 kg  BMI:  Body mass index is 22.58 kg/m.  Estimated Nutritional Needs:   Kcal:  1400-1600kcal/day  Protein:  70-80g/day  Fluid:  >1.4L/day  Koleen Distance MS, RD, LDN Pager #- [phone] Office#- [phone] After Hours Pager: [phone]

## 2019-09-11 NOTE — Care Management Important Message (Signed)
Important Message  Patient Details  Name: Tomeka Guay MRN: [id number] Date of Birth: 1936/04/16   Medicare Important Message Given:  Yes  Reviewed verbally over room phone with patient due to isolation status.  Aware of Medicare IM right.  Declined copy as already received one previously.     Dannette Barbara 09/11/2019, 1:14 PM

## 2019-09-12 LAB — BASIC METABOLIC PANEL
Anion gap: 9 (ref 5–15)
BUN: 39 mg/dL — ABNORMAL HIGH (ref 8–23)
CO2: 34 mmol/L — ABNORMAL HIGH (ref 22–32)
Calcium: 8.3 mg/dL — ABNORMAL LOW (ref 8.9–10.3)
Chloride: 93 mmol/L — ABNORMAL LOW (ref 98–111)
Creatinine, Ser: 1.21 mg/dL — ABNORMAL HIGH (ref 0.44–1.00)
GFR calc Af Amer: 49 mL/min — ABNORMAL LOW (ref >60)
GFR calc non Af Amer: 42 mL/min — ABNORMAL LOW (ref >60)
Glucose, Bld: 90 mg/dL (ref 70–99)
Potassium: 4.8 mmol/L (ref 3.5–5.1)
Sodium: 136 mmol/L (ref 135–145)

## 2019-09-12 LAB — CBC
HCT: 31.7 % — ABNORMAL LOW (ref 36.0–46.0)
Hemoglobin: 10.7 g/dL — ABNORMAL LOW (ref 12.0–15.0)
MCH: 30.5 pg (ref 26.0–34.0)
MCHC: 33.8 g/dL (ref 30.0–36.0)
MCV: 90.3 fL (ref 80.0–100.0)
Platelets: 222 10*3/uL (ref 150–400)
RBC: 3.51 MIL/uL — ABNORMAL LOW (ref 3.87–5.11)
RDW: 15.5 % (ref 11.5–15.5)
WBC: 8 10*3/uL (ref 4.0–10.5)
nRBC: 0 % (ref 0.0–0.2)

## 2019-09-12 LAB — GLUCOSE, CAPILLARY: Glucose-Capillary: 79 mg/dL (ref 70–99)

## 2019-09-12 NOTE — TOC Transition Note (Addendum)
Transition of Care [facility]) - CM/SW Discharge Note   Patient Details  Name: Catherine Landry MRN: [id number] Date of Birth: [birth year]  Transition of Care [facility]) CM/SW Contact:  Candie Chroman, LCSW Phone Number: 09/12/2019, 9:54 AM   Clinical Narrative: Administrator confirmed they are ready to accept patient today. RN aware and will set up EMS transport. Asked her to notify her brother when it's set up. No further concerns. CSW signing off.  Final next level of care: Assisted Living(with home health) Barriers to Discharge: Barriers Resolved   Patient Goals and CMS Choice     Choice offered to / list presented to : Patient, Sibling  Discharge Placement              Patient chooses bed at: Other - please specify in the comment section below:([facility]) Patient to be transferred to facility by: EMS Name of family member notified: Elicia Lamp Patient and family notified of of transfer: 09/11/19  Discharge Plan and Services     Post Acute Care Choice: [place]          DME Arranged: Oxygen DME Agency: [employer] Date DME Agency Contacted: 09/11/19   Representative spoke with at DME Agency: [place]: RN, PT [facility] Agency: [place] Date [facility] Agency Contacted: 09/11/19   Representative spoke with at [place]: Floydene Flock  Social Determinants of Health (SDOH) Interventions     Readmission Risk Interventions Readmission Risk Prevention Plan 09/05/2019 04/30/2019  Transportation Screening - Complete  PCP or Specialist Appt within 5-7 Days - Complete  PCP or Specialist Appt within 3-5 Days Complete -  Home Care Screening - Complete  Medication Review (RN CM) - Complete  HRI or Home Care Consult Complete -  Social Work Consult for [place] Planning/Counseling Complete -  Palliative Care Screening Complete -  Some recent data might be hidden

## 2019-09-12 NOTE — TOC Progression Note (Signed)
Transition of Care [facility]) - Progression Note    Patient Details  Name: Catherine Landry MRN: [id number] Date of Birth: 07-10-1936  Transition of Care [facility]) CM/SW [place], LCSW Phone Number: 09/12/2019, 8:47 AM  Clinical Narrative: Discharge delayed to today due to oxygen not being delivered until late. CSW called facility to see what time transport could be set up and person that answered asked that CSW call back around 9:30 because that their administrator will be in then.    Expected Discharge Plan: [place] Barriers to Discharge: Barriers Resolved  Expected Discharge Plan and Services Expected Discharge Plan: [place] Choice: [place] Living arrangements for the past 2 months: Single Family Home Expected Discharge Date: 09/12/19               DME Arranged: Oxygen DME Agency: [employer] Date DME Agency Contacted: 09/11/19   Representative spoke with at DME Agency: [place]: RN, PT [facility] Agency: [place] ([place]) Date [place]: 09/11/19   Representative spoke with at [place]: [place] (SDOH) Interventions    Readmission Risk Interventions Readmission Risk Prevention Plan 09/05/2019 04/30/2019  Transportation Screening - Complete  PCP or Specialist Appt within 5-7 Days - Complete  PCP or Specialist Appt within 3-5 Days Complete -  Home Care Screening - Complete  Medication Review (RN CM) - Complete  HRI or Home Care Consult Complete -  Social Work Consult for [place] Planning/Counseling Complete -  Palliative Care Screening Complete -  Some recent data might be hidden

## 2019-09-12 NOTE — Progress Notes (Signed)
Oxygen was delivered to facility last night. This RN called EMS for transport to [place]. Brother is made aware.

## 2019-09-14 ENCOUNTER — Other Ambulatory Visit: Payer: Self-pay

## 2019-09-14 ENCOUNTER — Encounter: Payer: Self-pay | Admitting: Emergency Medicine

## 2019-09-14 ENCOUNTER — Inpatient Hospital Stay
Admission: EM | Admit: 2019-09-14 | Discharge: 2019-09-19 | DRG: 190 | Disposition: A | Discharge status: Hospice | Payer: Medicare Other | Source: Skilled Nursing Facility | Attending: Internal Medicine | Admitting: Internal Medicine

## 2019-09-14 ENCOUNTER — Emergency Department: Payer: Medicare Other

## 2019-09-14 DIAGNOSIS — Z8551 Personal history of malignant neoplasm of bladder: Secondary | ICD-10-CM

## 2019-09-14 DIAGNOSIS — J440 Chronic obstructive pulmonary disease with acute lower respiratory infection: Secondary | ICD-10-CM | POA: Diagnosis not present

## 2019-09-14 DIAGNOSIS — Z905 Acquired absence of kidney: Secondary | ICD-10-CM

## 2019-09-14 DIAGNOSIS — R0602 Shortness of breath: Secondary | ICD-10-CM | POA: Diagnosis not present

## 2019-09-14 DIAGNOSIS — I5042 Chronic combined systolic (congestive) and diastolic (congestive) heart failure: Secondary | ICD-10-CM | POA: Diagnosis present

## 2019-09-14 DIAGNOSIS — J189 Pneumonia, unspecified organism: Secondary | ICD-10-CM | POA: Diagnosis present

## 2019-09-14 DIAGNOSIS — Z66 Do not resuscitate: Secondary | ICD-10-CM | POA: Diagnosis present

## 2019-09-14 DIAGNOSIS — M1990 Unspecified osteoarthritis, unspecified site: Secondary | ICD-10-CM | POA: Diagnosis present

## 2019-09-14 DIAGNOSIS — Z888 Allergy status to other drugs, medicaments and biological substances status: Secondary | ICD-10-CM

## 2019-09-14 DIAGNOSIS — Z8249 Family history of ischemic heart disease and other diseases of the circulatory system: Secondary | ICD-10-CM

## 2019-09-14 DIAGNOSIS — Z9849 Cataract extraction status, unspecified eye: Secondary | ICD-10-CM

## 2019-09-14 DIAGNOSIS — K219 Gastro-esophageal reflux disease without esophagitis: Secondary | ICD-10-CM | POA: Diagnosis present

## 2019-09-14 DIAGNOSIS — Z803 Family history of malignant neoplasm of breast: Secondary | ICD-10-CM

## 2019-09-14 DIAGNOSIS — K0889 Other specified disorders of teeth and supporting structures: Secondary | ICD-10-CM | POA: Diagnosis present

## 2019-09-14 DIAGNOSIS — J441 Chronic obstructive pulmonary disease with (acute) exacerbation: Secondary | ICD-10-CM | POA: Diagnosis present

## 2019-09-14 DIAGNOSIS — R778 Other specified abnormalities of plasma proteins: Secondary | ICD-10-CM | POA: Diagnosis present

## 2019-09-14 DIAGNOSIS — R7989 Other specified abnormal findings of blood chemistry: Secondary | ICD-10-CM | POA: Diagnosis present

## 2019-09-14 DIAGNOSIS — Z20828 Contact with and (suspected) exposure to other viral communicable diseases: Secondary | ICD-10-CM | POA: Diagnosis present

## 2019-09-14 DIAGNOSIS — Z823 Family history of stroke: Secondary | ICD-10-CM

## 2019-09-14 DIAGNOSIS — I2510 Atherosclerotic heart disease of native coronary artery without angina pectoris: Secondary | ICD-10-CM | POA: Diagnosis present

## 2019-09-14 DIAGNOSIS — Z7902 Long term (current) use of antithrombotics/antiplatelets: Secondary | ICD-10-CM

## 2019-09-14 DIAGNOSIS — Z9049 Acquired absence of other specified parts of digestive tract: Secondary | ICD-10-CM

## 2019-09-14 DIAGNOSIS — Z7982 Long term (current) use of aspirin: Secondary | ICD-10-CM

## 2019-09-14 DIAGNOSIS — Z7951 Long term (current) use of inhaled steroids: Secondary | ICD-10-CM

## 2019-09-14 DIAGNOSIS — J9622 Acute and chronic respiratory failure with hypercapnia: Secondary | ICD-10-CM | POA: Diagnosis present

## 2019-09-14 DIAGNOSIS — Z7189 Other specified counseling: Secondary | ICD-10-CM | POA: Diagnosis not present

## 2019-09-14 DIAGNOSIS — Z85528 Personal history of other malignant neoplasm of kidney: Secondary | ICD-10-CM

## 2019-09-14 DIAGNOSIS — I130 Hypertensive heart and chronic kidney disease with heart failure and stage 1 through stage 4 chronic kidney disease, or unspecified chronic kidney disease: Secondary | ICD-10-CM | POA: Diagnosis present

## 2019-09-14 DIAGNOSIS — J129 Viral pneumonia, unspecified: Secondary | ICD-10-CM | POA: Diagnosis present

## 2019-09-14 DIAGNOSIS — Z87891 Personal history of nicotine dependence: Secondary | ICD-10-CM

## 2019-09-14 DIAGNOSIS — I10 Essential (primary) hypertension: Secondary | ICD-10-CM | POA: Diagnosis present

## 2019-09-14 DIAGNOSIS — E875 Hyperkalemia: Secondary | ICD-10-CM | POA: Diagnosis present

## 2019-09-14 DIAGNOSIS — J9621 Acute and chronic respiratory failure with hypoxia: Secondary | ICD-10-CM | POA: Diagnosis present

## 2019-09-14 DIAGNOSIS — I248 Other forms of acute ischemic heart disease: Secondary | ICD-10-CM | POA: Diagnosis present

## 2019-09-14 DIAGNOSIS — Z515 Encounter for palliative care: Secondary | ICD-10-CM | POA: Diagnosis not present

## 2019-09-14 DIAGNOSIS — Z9981 Dependence on supplemental oxygen: Secondary | ICD-10-CM

## 2019-09-14 DIAGNOSIS — Z79899 Other long term (current) drug therapy: Secondary | ICD-10-CM

## 2019-09-14 DIAGNOSIS — N182 Chronic kidney disease, stage 2 (mild): Secondary | ICD-10-CM | POA: Diagnosis present

## 2019-09-14 LAB — COMPREHENSIVE METABOLIC PANEL
ALT: 8 U/L (ref 0–44)
AST: 29 U/L (ref 15–41)
Albumin: 3.5 g/dL (ref 3.5–5.0)
Alkaline Phosphatase: 49 U/L (ref 38–126)
Anion gap: 8 (ref 5–15)
BUN: 23 mg/dL (ref 8–23)
CO2: 31 mmol/L (ref 22–32)
Calcium: 8.4 mg/dL — ABNORMAL LOW (ref 8.9–10.3)
Chloride: 96 mmol/L — ABNORMAL LOW (ref 98–111)
Creatinine, Ser: 1.05 mg/dL — ABNORMAL HIGH (ref 0.44–1.00)
GFR calc Af Amer: 58 mL/min — ABNORMAL LOW (ref >60)
GFR calc non Af Amer: 50 mL/min — ABNORMAL LOW (ref >60)
Glucose, Bld: 188 mg/dL — ABNORMAL HIGH (ref 70–99)
Potassium: 5.2 mmol/L — ABNORMAL HIGH (ref 3.5–5.1)
Sodium: 135 mmol/L (ref 135–145)
Total Bilirubin: 1.1 mg/dL (ref 0.3–1.2)
Total Protein: 6.9 g/dL (ref 6.5–8.1)

## 2019-09-14 LAB — RESPIRATORY PANEL BY RT PCR (FLU A&B, COVID)
Influenza A by PCR: NEGATIVE
Influenza B by PCR: NEGATIVE
SARS Coronavirus 2 by RT PCR: NEGATIVE

## 2019-09-14 LAB — CBC WITH DIFFERENTIAL/PLATELET
Abs Immature Granulocytes: 0.08 10*3/uL — ABNORMAL HIGH (ref 0.00–0.07)
Basophils Absolute: 0.1 10*3/uL (ref 0.0–0.1)
Basophils Relative: 0 %
Eosinophils Absolute: 1.1 10*3/uL — ABNORMAL HIGH (ref 0.0–0.5)
Eosinophils Relative: 8 %
HCT: 34.3 % — ABNORMAL LOW (ref 36.0–46.0)
Hemoglobin: 10.9 g/dL — ABNORMAL LOW (ref 12.0–15.0)
Immature Granulocytes: 1 %
Lymphocytes Relative: 11 %
Lymphs Abs: 1.6 10*3/uL (ref 0.7–4.0)
MCH: 30.9 pg (ref 26.0–34.0)
MCHC: 31.8 g/dL (ref 30.0–36.0)
MCV: 97.2 fL (ref 80.0–100.0)
Monocytes Absolute: 2.4 10*3/uL — ABNORMAL HIGH (ref 0.1–1.0)
Monocytes Relative: 16 %
Neutro Abs: 9.6 10*3/uL — ABNORMAL HIGH (ref 1.7–7.7)
Neutrophils Relative %: 64 %
Platelets: 190 10*3/uL (ref 150–400)
RBC: 3.53 MIL/uL — ABNORMAL LOW (ref 3.87–5.11)
RDW: 15.5 % (ref 11.5–15.5)
WBC: 14.8 10*3/uL — ABNORMAL HIGH (ref 4.0–10.5)
nRBC: 0 % (ref 0.0–0.2)

## 2019-09-14 LAB — TROPONIN I (HIGH SENSITIVITY)
Troponin I (High Sensitivity): 35 ng/L — ABNORMAL HIGH (ref <18)
Troponin I (High Sensitivity): 37 ng/L — ABNORMAL HIGH (ref <18)

## 2019-09-14 LAB — BRAIN NATRIURETIC PEPTIDE: B Natriuretic Peptide: 475 pg/mL — ABNORMAL HIGH (ref 0.0–100.0)

## 2019-09-14 LAB — FIBRIN DERIVATIVES D-DIMER (ARMC ONLY): Fibrin derivatives D-dimer (ARMC): 1275.39 ng/mL (FEU) — ABNORMAL HIGH (ref 0.00–499.00)

## 2019-09-14 LAB — LACTIC ACID, PLASMA: Lactic Acid, Venous: 1.1 mmol/L (ref 0.5–1.9)

## 2019-09-14 LAB — FIBRINOGEN: Fibrinogen: 495 mg/dL — ABNORMAL HIGH (ref 210–475)

## 2019-09-14 MED ORDER — IPRATROPIUM-ALBUTEROL 0.5-2.5 (3) MG/3ML IN SOLN
3.0000 mL | Freq: Four times a day (QID) | RESPIRATORY_TRACT | Status: DC
  Administered 2019-09-14 – 2019-09-16 (×5): 3 mL via RESPIRATORY_TRACT
  Filled 2019-09-14 (×6): qty 3

## 2019-09-14 MED ORDER — VANCOMYCIN HCL IN DEXTROSE 1-5 GM/200ML-% IV SOLN
1000.0000 mg | Freq: Once | INTRAVENOUS | Status: AC
  Administered 2019-09-14: 1000 mg via INTRAVENOUS
  Filled 2019-09-14: qty 200

## 2019-09-14 MED ORDER — SODIUM CHLORIDE 0.9 % IV SOLN
2.0000 g | Freq: Once | INTRAVENOUS | Status: AC
  Administered 2019-09-14: 2 g via INTRAVENOUS
  Filled 2019-09-14: qty 2

## 2019-09-14 MED ORDER — IPRATROPIUM-ALBUTEROL 0.5-2.5 (3) MG/3ML IN SOLN
3.0000 mL | Freq: Once | RESPIRATORY_TRACT | Status: AC
  Administered 2019-09-14: 3 mL via RESPIRATORY_TRACT
  Filled 2019-09-14: qty 3

## 2019-09-14 MED ORDER — GUAIFENESIN-DM 100-10 MG/5ML PO SYRP
10.0000 mL | ORAL_SOLUTION | ORAL | Status: DC | PRN
  Administered 2019-09-15 – 2019-09-17 (×4): 10 mL via ORAL
  Filled 2019-09-14 (×7): qty 10

## 2019-09-14 MED ORDER — METHYLPREDNISOLONE SODIUM SUCC 40 MG IJ SOLR
0.2500 mg/kg | Freq: Two times a day (BID) | INTRAMUSCULAR | Status: DC
  Administered 2019-09-14 – 2019-09-16 (×3): 14 mg via INTRAVENOUS
  Filled 2019-09-14 (×3): qty 1

## 2019-09-14 NOTE — H&P (Signed)
Catherine Landry [id number] DOB: 20-Feb-1936 DOA: 09/14/2019     PCP: Tracie Harrier, MD   Outpatient Specialists:   CARDS:  Dr. Sydnee Levans    Patient arrived to ER on 09/14/19 at 2000  Patient coming from:   From facility [facility] [place]  Chief Complaint:   Chief Complaint  Patient presents with  . Shortness of Breath    HPI: Catherine Landry is a 83 y.o. female with medical history significant of  CAD sp stent placement, HTn, COPD on 2L O2, CKD stage 2, chronic diastolic CHF, GERD    renal cancer status post left nephrectomy   Presented with worsening shortness of breath and tachypnea  Recently was admitted from 10-20 1 December for COPD exacerbation Her husband recently tested positive for Covid Patient her self presented with  fevers and chills in the time Although initially tested negative for Covid bilateral infiltrates on her chest x-ray was worrisome for Covid pneumonia Respiratory viral panel was negative D-dimer was significantly elevated troponin elevated at 29   Patient was treated with antibiotics and steroids at the time of discharge on 21 December she was able to be weaned down to 2 L nasal cannula  Hospitalization was complicated by acute on chronic diastolic CHF exacerbation which was treated with Lasix  At the time of discharge palliative care was consulted and patient was changed to DNR  She was seen by physical therapist with recommended SNF placement but she wanted to go to assisted living instead.  Patient was discharged to [place] On December 21  During her hospital stay patient undergone CTA angio of the chest on 10 December showing patchy groundglass infiltrates bilaterally highly suggestive of viral pneumonia.   Infectious risk factors:  Reports  shortness of breath,  known COVID 19 exposure,        PCR testing   NEGATIVE  Lab Results  Component Value Date   SARSCOV2NAA NEGATIVE 09/14/2019   [place]  NEGATIVE 09/09/2019   [place] NEGATIVE 08/31/2019   [place] NEGATIVE 08/31/2019     Regarding pertinent Chronic problems:     HTN on losartan and metoprolol   chronic CHF diastolic/systolic/ combined -  catheterization showed EF 45-50%    CAD  - On Aspirin, statin, betablocker, Plavix                 -  followed by cardiology                - last cardiac  Cath showed  100% stenosis of proximal circumflex, 99% stenosis of RCA.High risk for PCI and was advised to continue with dual antiplatelets.     COPD - on baseline oxygen  2 L,      CKD stage II - baseline Cr 1.2 Lab Results  Component Value Date   CREATININE 1.05 (H) 09/14/2019   CREATININE 1.21 (H) 09/12/2019   CREATININE 1.14 (H) 09/11/2019     While in ER: [place] from [facility] [place] with increased shortness of breath EMS attempted to administer nebulizer treatment at home but did not seem to help.  Arrived on 3 L with oxygen saturation between 94 to 97%.   The following Work up has been ordered so far:  Orders Placed This Encounter  Procedures  . Respiratory Panel by RT PCR (Flu A&B, Covid) - Nasopharyngeal Swab  . DG Chest Portable 1 View  . Comprehensive metabolic panel  . CBC with Differential  .  Fibrin derivatives D-Dimer  . Brain natriuretic peptide  . Cardiac monitoring  . vancomycin per pharmacy consult  . ceFEPime (MAXIPIME) per pharmacy consult  . Consult to hospitalist  Call [phone]  . Pulse oximetry, continuous  . EKG 12-Lead  . ED EKG     Following Medications were ordered in ER: Medications  vancomycin (VANCOCIN) IVPB 1000 mg/200 mL premix (has no administration in time range)  ceFEPIme (MAXIPIME) 2 g in sodium chloride 0.9 % 100 mL IVPB (2 g Intravenous New Bag/Given 09/14/19 2231)  ipratropium-albuterol (DUONEB) 0.5-2.5 (3) MG/3ML nebulizer solution 3 mL (3 mLs Nebulization Given 09/14/19 2025)        Consult Orders  (From admission, onward)          Start     Ordered   09/14/19 2230  Consult to hospitalist  Call [phone]  Once    Comments: Call [phone]  Provider:  (Not yet assigned)  Question Answer Comment  Place call to: Hospitalist   Reason for Consult Admit   Diagnosis/Clinical Info for Consult: Pneumonia, covid negative      09/14/19 2230          Significant initial  Findings: Abnormal Labs Reviewed  COMPREHENSIVE METABOLIC PANEL - Abnormal; Notable for the following components:      Result Value   Potassium 5.2 (*)    Chloride 96 (*)    Glucose, Bld 188 (*)    Creatinine, Ser 1.05 (*)    Calcium 8.4 (*)    GFR calc non Af Amer 50 (*)    GFR calc Af Amer 58 (*)    All other components within normal limits  CBC WITH DIFFERENTIAL/PLATELET - Abnormal; Notable for the following components:   WBC 14.8 (*)    RBC 3.53 (*)    Hemoglobin 10.9 (*)    HCT 34.3 (*)    Neutro Abs 9.6 (*)    Monocytes Absolute 2.4 (*)    Eosinophils Absolute 1.1 (*)    Abs Immature Granulocytes 0.08 (*)    All other components within normal limits  FIBRIN DERIVATIVES D-DIMER ([facility] ONLY) - Abnormal; Notable for the following components:   Fibrin derivatives D-dimer ([facility]) 1,275.39 (*)    All other components within normal limits  BRAIN NATRIURETIC PEPTIDE - Abnormal; Notable for the following components:   B Natriuretic Peptide 475.0 (*)    All other components within normal limits  TROPONIN I (HIGH SENSITIVITY) - Abnormal; Notable for the following components:   Troponin I (High Sensitivity) 35 (*)    All other components within normal limits    Otherwise labs showing:    Recent Labs  Lab 09/09/19 0500 09/10/19 0538 09/11/19 0510 09/12/19 0400 09/14/19 2008  NA 136 136 134* 136 135  K 4.4 5.0 4.7 4.8 5.2*  CO2 33* 34* 36* 34* 31  GLUCOSE 110* 102* 101* 90 188*  BUN 29* 33* 34* 39* 23  CREATININE 1.06* 1.09* 1.14* 1.21* 1.05*  CALCIUM 7.9* 8.4* 8.5* 8.3* 8.4*    Cr    stable,   Lab Results  Component Value Date   CREATININE  1.05 (H) 09/14/2019   CREATININE 1.21 (H) 09/12/2019   CREATININE 1.14 (H) 09/11/2019    Recent Labs  Lab 09/14/19 2008  AST 29  ALT 8  ALKPHOS 49  BILITOT 1.1  PROT 6.9  ALBUMIN 3.5   Lab Results  Component Value Date   CALCIUM 8.4 (L) 09/14/2019      WBC  Component Value Date/Time   WBC 14.8 (H) 09/14/2019 2008   ANC    Component Value Date/Time   NEUTROABS 9.6 (H) 09/14/2019 2008   ALC No components found for: LYMPHAB    Plt: Lab Results  Component Value Date   PLT 190 09/14/2019    Lactic Acid, Venous    Component Value Date/Time   LATICACIDVEN 1.1 09/14/2019 2008    Procalcitonin <0.1   COVID-19 Labs  No results for input(s): DDIMER, FERRITIN, LDH, CRP in the last 72 hours.  Lab Results  Component Value Date   SARSCOV2NAA NEGATIVE 09/14/2019   [place] NEGATIVE 09/09/2019   [place] NEGATIVE 08/31/2019   [place] NEGATIVE 08/31/2019      Venous  Blood Gas result:  pH 7.30 pCO2 81     after biPAP ABG    Component Value Date/Time   PHART 7.34 (L) 09/15/2019 0233   PCO2ART 70 (HH) 09/15/2019 0233   PO2ART 90 09/15/2019 0233   HCO3 37.8 (H) 09/15/2019 0233   O2SAT 96.4 09/15/2019 0233      HG/HCT  stable,       Component Value Date/Time   HGB 10.9 (L) 09/14/2019 2008   HCT 34.3 (L) 09/14/2019 2008     Troponin 35-37 ordered      ECG: Ordered Personally reviewed by me showing: HR : 83 Rhythm: NSR, w PVC  no evidence of ischemic changes QTC 460   BNP (last 3 results) Recent Labs    09/05/19 0452 09/06/19 0455 09/14/19 2008  BNP 581.0* 381.0* 475.0*    ProBNP (last 3 results) No results for input(s): PROBNP in the last 8760 hours.  DM  labs:  HbA1C: No results for input(s): HGBA1C in the last 8760 hours.     CBG (last 3)  Recent Labs    09/12/19 0811  GLUCAP 79      UA  not ordered      CXR -stable persistent infiltrates     ED Triage Vitals  Enc Vitals Group     BP 09/14/19 2002 (!)  124/93     Pulse Rate 09/14/19 2006 79     Resp 09/14/19 2006 (!) 27     Temp 09/14/19 2006 98.2 F (36.8 C)     Temp Source 09/14/19 2006 Oral     SpO2 09/14/19 2005 95 %     Weight 09/14/19 2007 123 lb (55.8 kg)     Height 09/14/19 2007 5\' 2"  (1.575 m)     Head Circumference --      Peak Flow --      Pain Score 09/14/19 2007 0     Pain Loc --      Pain Edu? --      Excl. in [place]? --   TMAX(24)@       Latest  Blood pressure 102/79, pulse 87, temperature 98.2 F (36.8 C), temperature source Oral, resp. rate (!) 24, height 5\' 2"  (1.575 m), weight 55.8 kg, SpO2 100 %.    Hospitalist was called for admission for COPD exacerbation    Review of Systems:    Pertinent positives include:  shortness of breath at rest.   dyspnea on exertion  Constitutional:  No weight loss, night sweats, Fevers, chills, fatigue, weight loss  HEENT:  No headaches, Difficulty swallowing,Tooth/dental problems,Sore throat,  No sneezing, itching, ear ache, nasal congestion, post nasal drip,  Cardio-vascular:  No chest pain, Orthopnea, PND, anasarca, dizziness, palpitations.no Bilateral lower extremity swelling  GI:  No heartburn, indigestion,  abdominal pain, nausea, vomiting, diarrhea, change in bowel habits, loss of appetite, melena, blood in stool, hematemesis Resp:  no , No excess mucus, no productive cough, No non-productive cough, No coughing up of blood.No change in color of mucus.No wheezing. Skin:  no rash or lesions. No jaundice GU:  no dysuria, change in color of urine, no urgency or frequency. No straining to urinate.  No flank pain.  Musculoskeletal:  No joint pain or no joint swelling. No decreased range of motion. No back pain.  Psych:  No change in mood or affect. No depression or anxiety. No memory loss.  Neuro: no localizing neurological complaints, no tingling, no weakness, no double vision, no gait abnormality, no slurred speech, no confusion  All systems reviewed and apart from  [place] all are negative  Past Medical History:   Past Medical History:  Diagnosis Date  . Arthritis   . Asthma   . Bladder cancer ([place]) 2015  . Cancer [facility]) 2001   kidney left   . CHF (congestive heart failure) ([place])   . COPD (chronic obstructive pulmonary disease) ([place])   . GERD (gastroesophageal reflux disease)   . Gout   . History of bladder cancer   . History of kidney cancer   . Hypertension   . Raynaud's disease   . Renal insufficiency       Past Surgical History:  Procedure Laterality Date  . APPENDECTOMY    . BACK SURGERY    . CATARACT EXTRACTION    . COLONOSCOPY WITH PROPOFOL N/A 10/15/2016   Procedure: COLONOSCOPY WITH PROPOFOL;  Surgeon: Lollie Sails, MD;  Location: [facility] ENDOSCOPY;  Service: Endoscopy;  Laterality: N/A;  . ESOPHAGOGASTRODUODENOSCOPY (EGD) WITH PROPOFOL N/A 10/15/2016   Procedure: ESOPHAGOGASTRODUODENOSCOPY (EGD) WITH PROPOFOL;  Surgeon: Lollie Sails, MD;  Location: [facility] ENDOSCOPY;  Service: Endoscopy;  Laterality: N/A;  . EYE SURGERY Left 12/2018  . NEPHRECTOMY    . RIGHT/LEFT HEART CATH AND CORONARY ANGIOGRAPHY N/A 06/21/2019   Procedure: RIGHT/LEFT HEART CATH AND CORONARY ANGIOGRAPHY;  Surgeon: Teodoro Spray, MD;  Location: [place] CV LAB;  Service: Cardiovascular;  Laterality: N/A;    Social History:  Ambulatory walker        reports that she quit smoking about 19 years ago. She has a 60.00 pack-year smoking history. She has never used smokeless tobacco. She reports current alcohol use. She reports that she does not use drugs.   Family History:   Family History  Problem Relation Age of Onset  . Breast cancer Mother 23  . Stroke Mother   . Breast cancer Other 40  . Heart disease Father     Allergies: Allergies  Allergen Reactions  . Nitrofurantoin Diarrhea and Other (See Comments)    Headache     Prior to Admission medications   Medication Sig Start Date End Date Taking? Authorizing Provider  acetaminophen  (TYLENOL) 650 MG CR tablet Take 650 mg by mouth every 8 (eight) hours as needed for pain.   Yes [provider]  ascorbic acid (VITAMIN C) 500 MG tablet Take 1 tablet by mouth daily.   Yes [provider]  aspirin EC 81 MG tablet Take 81 mg by mouth every morning.   Yes [provider]  busPIRone (BUSPAR) 7.5 MG tablet Take 7.5 mg by mouth 2 (two) times daily. 08/08/19  Yes [provider]  Calcium Carbonate-Vit D-Min (CALCIUM 600+D PLUS MINERALS) 600-400 MG-UNIT TABS Take 2 tablets by mouth daily.   Yes [provider]  clopidogrel (PLAVIX) 75 MG tablet Take 1 tablet (75 mg total) by mouth daily. 06/21/19 06/20/20 Yes Fath, Javier Docker, MD  COMBIVENT RESPIMAT 20-100 MCG/ACT AERS respimat Inhale 1 puff into the lungs 4 (four) times daily as needed for wheezing or shortness of breath.    Yes [provider]  fluticasone (FLONASE) 50 MCG/ACT nasal spray Place 2 sprays into both nostrils daily.   Yes [provider]  Fluticasone-Salmeterol (ADVAIR) 250-50 MCG/DOSE AEPB Inhale 1 puff into the lungs 2 (two) times daily.   Yes [provider]  furosemide (LASIX) 20 MG tablet Take 20 mg by mouth daily. 07/18/19  Yes [provider]  iron polysaccharides (NIFEREX) 150 MG capsule Take 150 mg by mouth 2 (two) times daily.    Yes [provider]  levalbuterol (XOPENEX) 0.63 MG/3ML nebulizer solution INHALE THE CONTENTS OF 1 VIAL IN NEBULIZER EVERY 6 HRS AS NEEDED FOR WHEEZING OR SHORTNESS OF BREATH 07/12/19  Yes Kasa, Maretta Bees, MD  losartan (COZAAR) 50 MG tablet Take 50 mg by mouth every morning.   Yes [provider]  metoprolol succinate (TOPROL-XL) 25 MG 24 hr tablet Take 0.5 tablets (12.5 mg total) by mouth every morning. 09/12/19 10/12/19 Yes Wyvonnia Dusky, MD  Multiple Vitamin (MULTIVITAMIN WITH MINERALS) TABS Take 1 tablet by mouth every morning.   Yes [provider]  nitroGLYCERIN (NITROSTAT) 0.4  MG SL tablet Place 0.4 mg under the tongue every 5 (five) minutes as needed for chest pain.  08/03/19  Yes [provider]  pantoprazole (PROTONIX) 40 MG tablet Take 40 mg by mouth 2 (two) times daily.   Yes [provider]  polyethylene glycol powder (GLYCOLAX/MIRALAX) powder Take 17 g by mouth daily as needed for moderate constipation. Mix with 4 to 8 ounces of fluid. 04/10/16  Yes [provider]  theophylline (THEODUR) 300 MG 12 hr tablet Take 300 mg by mouth daily. 08/08/19  Yes [provider]  vitamin B-12 (CYANOCOBALAMIN) 1000 MCG tablet Take 3,000 mcg by mouth daily.   Yes [provider]   Physical Exam: Blood pressure 102/79, pulse 87, temperature 98.2 F (36.8 C), temperature source Oral, resp. rate (!) 24, height 5\' 2"  (1.575 m), weight 55.8 kg, SpO2 100 %. 1. General:  in No Acute distress   Chronically ill  -appearing 2. Psychological: Alert and  Oriented 3. Head/ENT:    Dry Mucous Membranes                          Head Non traumatic, neck supple                           Poor Dentition 4. SKIN: decreased Skin turgor,  Skin clean Dry and intact no rash 5. Heart: Regular rate and rhythm no Murmur, no Rub or gallop 6. Lungs  Extensive wheezes no crackles   7. Abdomen: Soft,  non-tender, Non distended   8. Lower extremities: no clubbing, cyanosis, no  edema 9. Neurologically Grossly intact, moving all 4 extremities equally   10. MSK: Normal range of motion   All other LABS:     Recent Labs  Lab 09/09/19 0500 09/10/19 0538 09/11/19 0510 09/12/19 0400 09/14/19 2008  WBC 6.4 7.2 7.5 8.0 14.8*  NEUTROABS  --   --   --   --  9.6*  HGB 10.7* 10.8* 11.5* 10.7* 10.9*  HCT 31.5* 31.7* 33.7*  31.7* 34.3*  MCV 89.0 89.3 88.9 90.3 97.2  PLT 213 213 232 222 190     Recent Labs  Lab 09/09/19 0500 09/10/19 0538 09/11/19 0510 09/12/19 0400 09/14/19 2008  NA 136 136 134* 136 135  K 4.4 5.0 4.7 4.8 5.2*  CL 94* 95* 92* 93* 96*    CO2 33* 34* 36* 34* 31  GLUCOSE 110* 102* 101* 90 188*  BUN 29* 33* 34* 39* 23  CREATININE 1.06* 1.09* 1.14* 1.21* 1.05*  CALCIUM 7.9* 8.4* 8.5* 8.3* 8.4*     Recent Labs  Lab 09/14/19 2008  AST 29  ALT 8  ALKPHOS 49  BILITOT 1.1  PROT 6.9  ALBUMIN 3.5       Cultures:    Component Value Date/Time   SDES  04/26/2019 0222    URINE, RANDOM Performed at [facility], [address], [place], [place] [address]    [facility]  04/26/2019 0222    Normal Performed at [facility], [address] [place], [place] [address]    CULT  04/26/2019 0222    Multiple bacterial morphotypes present, none predominant. Suggest appropriate recollection if clinically indicated.   REPTSTATUS 04/27/2019 FINAL 04/26/2019 0222     Radiological Exams on Admission: DG Chest Portable 1 View  Result Date: 09/14/2019 CLINICAL DATA:  Recurrent shortness of breath EXAM: PORTABLE CHEST 1 VIEW COMPARISON:  Radiograph 09/08/2019 FINDINGS: Hyperinflation of the lungs with flattening of the diaphragms. There are some faint residual patchy opacities within the lungs which may reflect ongoing infection/inflammation or sequela there of. No pneumothorax. No visible effusion. The aorta is calcified. The remaining cardiomediastinal contours are unremarkable. No acute osseous or soft tissue abnormality. Degenerative changes are present in the imaged spine and shoulders. Surgical clips noted in the left upper quadrant. IMPRESSION: Faint residual patchy opacities within the lungs which may reflect ongoing infection/inflammation or sequela thereof. Aortic Atherosclerosis (ICD10-I70.0). Electronically Signed   By: Lovena Le M.D.   On: 09/14/2019 20:31    Chart has been reviewed   Assessment/Plan  83 y.o. female with medical history significant of  CAD sp stent placement, HTn, COPD on 2L O2, CKD stage 2, chronic diastolic CHF, GERD    renal cancer status post left nephrectomy, recently admitted  for presumed COVID vs COPD exacerbation   Presented with worsening shortness of breath and tachypnea  Admitted for COPD exacerbation  Present on Admission: . COPD exacerbation ([place]) -   Will initiate: Steroid taper  -  Antibiotics   Doxycycline, - Albuterol  PRN, - scheduled duoneb,  -  Breo or Dulera at discharge   -  Mucinex.  Titrate O2 to saturation >90%. Follow patients respiratory status.   VBG showed hypercarbia Due to increased work of breathing patient started on BiPAP   BiPAP ordered PRN for increased work of breathing.  Currently mentating well no evidence of symptomatic hypercarbia After BiPAP ABG has improved  . Acute on chronic respiratory failure with hypoxia (HCC) and hypercarbia Complicated history with known exposure to Covid in the beginning of December.  Currently has tested negative for Covid continue precautions for now.  Given that patient already been using nebulizers and BiPAP  . Elevated troponin -continue to monitor likely secondary to increased demand secondary to hypoxia/increased work of breathing  . Multifocal pneumonia vs CXR laging pt has recently been treated with antibiotics no new fever, WBC is elevated but she was on steroids Obtain procalcitonin In ER got Cefepime and vanc Would  reassess in AM to see if would benefit from continuation of antibiotics No evidence of Sepsis  . Essential hypertension chronic stable stable continue home medications  CKD - chronic avoid nephrotoxic medications  Other plan as per orders.  DVT prophylaxis:    Lovenox     Code Status:    DNR/DNI   as per patient    I had personally discussed CODE STATUS with patient    Family Communication:   Family not at  Bedside    Disposition Plan:                            Back to current facility when stable                                                 Would benefit from PT/OT eval prior to DC  Ordered                   Swallow eval - SLP ordered                                         Consults called: none  Admission status:  ED Disposition    None    Obs     Level of care           SDU tele indefinitely please discontinue once patient no longer qualifies   Precautions: admitted as  PUI  Airborne and Contact precautions   PCR is negative  - but recently was admitted for presumed COVID infection despite negative PCR.  Pt is immuno compromised would keep on precautions for at least 21 days   PPE: Used by the provider:   P100  eye Goggles,  Gloves  gown     Opie Maclaughlin 09/15/2019, 3:31 AM    [facility]     after 2 AM please page floor coverage PA If 7AM-7PM, please contact the day team taking care of the patient using [email or web address]

## 2019-09-14 NOTE — ED Provider Notes (Signed)
[facility] Emergency Department Provider Note ____________________________________________   First MD Initiated Contact with Patient 09/14/19 2008     (approximate)  I have reviewed the triage vital signs and the nursing notes.   HISTORY  Chief Complaint Shortness of Breath    HPI Catherine Landry is a 83 y.o. female with PMH as noted below including CHF and COPD (normally on 2 L O2 at home) who presents with worsening shortness of breath over the last several days associated with cough and with throat discomfort ever since she was discharged from the hospital several days ago.  She has had to go up to 3 L by nasal cannula because her oxygen level was low.  She reports feeling weak but denies acute pain.  Past Medical History:  Diagnosis Date  . Arthritis   . Asthma   . Bladder cancer ([place]) 2015  . Cancer [facility]) 2001   kidney left   . CHF (congestive heart failure) ([place])   . COPD (chronic obstructive pulmonary disease) ([place])   . GERD (gastroesophageal reflux disease)   . Gout   . History of bladder cancer   . History of kidney cancer   . Hypertension   . Raynaud's disease   . Renal insufficiency     Patient Active Problem List   Diagnosis Date Noted  . Palliative care by specialist   . DNR (do not resuscitate) discussion   . Goals of care, counseling/discussion   . Multifocal pneumonia   . Essential hypertension   . AKI (acute kidney injury) ([place])   . Respiratory failure with hypoxia ([place]) 08/31/2019  . Respiratory failure, acute ([place]) 04/26/2019  . Elevated troponin 04/26/2019  . Personal history of bladder cancer 03/16/2018  . Acute on chronic respiratory failure with hypoxia ([place]) 11/23/2017  . UTI (lower urinary tract infection) 04/25/2016  . COPD exacerbation ([place]) 10/06/2015    Past Surgical History:  Procedure Laterality Date  . APPENDECTOMY    . BACK SURGERY    . CATARACT EXTRACTION    . COLONOSCOPY WITH PROPOFOL N/A  10/15/2016   Procedure: COLONOSCOPY WITH PROPOFOL;  Surgeon: Lollie Sails, MD;  Location: [facility] ENDOSCOPY;  Service: Endoscopy;  Laterality: N/A;  . ESOPHAGOGASTRODUODENOSCOPY (EGD) WITH PROPOFOL N/A 10/15/2016   Procedure: ESOPHAGOGASTRODUODENOSCOPY (EGD) WITH PROPOFOL;  Surgeon: Lollie Sails, MD;  Location: [facility] ENDOSCOPY;  Service: Endoscopy;  Laterality: N/A;  . EYE SURGERY Left 12/2018  . NEPHRECTOMY    . RIGHT/LEFT HEART CATH AND CORONARY ANGIOGRAPHY N/A 06/21/2019   Procedure: RIGHT/LEFT HEART CATH AND CORONARY ANGIOGRAPHY;  Surgeon: Teodoro Spray, MD;  Location: [place] CV LAB;  Service: Cardiovascular;  Laterality: N/A;    Prior to Admission medications   Medication Sig Start Date End Date Taking? Authorizing Provider  acetaminophen (TYLENOL) 650 MG CR tablet Take 650 mg by mouth every 8 (eight) hours as needed for pain.   Yes [provider]  ascorbic acid (VITAMIN C) 500 MG tablet Take 1 tablet by mouth daily.   Yes [provider]  aspirin EC 81 MG tablet Take 81 mg by mouth every morning.   Yes [provider]  busPIRone (BUSPAR) 7.5 MG tablet Take 7.5 mg by mouth 2 (two) times daily. 08/08/19  Yes [provider]  Calcium Carbonate-Vit D-Min (CALCIUM 600+D PLUS MINERALS) 600-400 MG-UNIT TABS Take 2 tablets by mouth daily.   Yes [provider]  clopidogrel (PLAVIX) 75 MG tablet Take 1 tablet (75 mg total) by  mouth daily. 06/21/19 06/20/20 Yes Fath, Javier Docker, MD  COMBIVENT RESPIMAT 20-100 MCG/ACT AERS respimat Inhale 1 puff into the lungs 4 (four) times daily as needed for wheezing or shortness of breath.    Yes [provider]  fluticasone (FLONASE) 50 MCG/ACT nasal spray Place 2 sprays into both nostrils daily.   Yes [provider]  Fluticasone-Salmeterol (ADVAIR) 250-50 MCG/DOSE AEPB Inhale 1 puff into the lungs 2 (two) times daily.   Yes [provider]  furosemide (LASIX) 20 MG tablet Take 20  mg by mouth daily. 07/18/19  Yes [provider]  iron polysaccharides (NIFEREX) 150 MG capsule Take 150 mg by mouth 2 (two) times daily.    Yes [provider]  levalbuterol (XOPENEX) 0.63 MG/3ML nebulizer solution INHALE THE CONTENTS OF 1 VIAL IN NEBULIZER EVERY 6 HRS AS NEEDED FOR WHEEZING OR SHORTNESS OF BREATH 07/12/19  Yes Kasa, Maretta Bees, MD  losartan (COZAAR) 50 MG tablet Take 50 mg by mouth every morning.   Yes [provider]  metoprolol succinate (TOPROL-XL) 25 MG 24 hr tablet Take 0.5 tablets (12.5 mg total) by mouth every morning. 09/12/19 10/12/19 Yes Wyvonnia Dusky, MD  Multiple Vitamin (MULTIVITAMIN WITH MINERALS) TABS Take 1 tablet by mouth every morning.   Yes [provider]  nitroGLYCERIN (NITROSTAT) 0.4 MG SL tablet Place 0.4 mg under the tongue every 5 (five) minutes as needed for chest pain.  08/03/19  Yes [provider]  pantoprazole (PROTONIX) 40 MG tablet Take 40 mg by mouth 2 (two) times daily.   Yes [provider]  polyethylene glycol powder (GLYCOLAX/MIRALAX) powder Take 17 g by mouth daily as needed for moderate constipation. Mix with 4 to 8 ounces of fluid. 04/10/16  Yes [provider]  theophylline (THEODUR) 300 MG 12 hr tablet Take 300 mg by mouth daily. 08/08/19  Yes [provider]  vitamin B-12 (CYANOCOBALAMIN) 1000 MCG tablet Take 3,000 mcg by mouth daily.   Yes [provider]    Allergies Nitrofurantoin  Family History  Problem Relation Age of Onset  . Breast cancer Mother 57  . Stroke Mother   . Breast cancer Other 35  . Heart disease Father     Social History Social History   Tobacco Use  . Smoking status: Former Smoker    Packs/day: 1.50    Years: 40.00    Pack years: 60.00    Quit date: 12/09/1999    Years since quitting: 19.7  . Smokeless tobacco: Never Used  Substance Use Topics  . Alcohol use: Yes    Comment: Socally   . Drug use: No    Review of  Systems  Constitutional: No fever. Eyes: No redness. ENT: No sore throat. Cardiovascular: Denies chest pain. Respiratory: Positive for shortness of breath. Gastrointestinal: No vomiting or diarrhea.  Genitourinary: Negative for dysuria.  Musculoskeletal: Negative for back pain. Skin: Negative for rash. Neurological: Negative for headache.   ____________________________________________   PHYSICAL EXAM:  VITAL SIGNS: ED Triage Vitals  Enc Vitals Group     BP 09/14/19 2002 (!) 124/93     Pulse Rate 09/14/19 2006 79     Resp 09/14/19 2006 (!) 27     Temp 09/14/19 2006 98.2 F (36.8 C)     Temp Source 09/14/19 2006 Oral     SpO2 09/14/19 2005 95 %     Weight 09/14/19 2007 123 lb (55.8 kg)     Height 09/14/19 2007 5\' 2"  (1.575 m)  Head Circumference --      Peak Flow --      Pain Score 09/14/19 2007 0     Pain Loc --      Pain Edu? --      Excl. in [place]? --     Constitutional: Alert and oriented.  Weak appearing.   Eyes: Conjunctivae are normal.  Head: Atraumatic. Nose: Rhinorrhea. Mouth/Throat: Mucous membranes are moist.   Neck: Normal range of motion.  Cardiovascular: Normal rate, regular rhythm. Grossly normal heart sounds.  Good peripheral circulation. Respiratory: Increased respiratory effort.  No retractions.  Somewhat decreased breath sounds bilaterally. Gastrointestinal: Soft and nontender. No distention.  Genitourinary: No flank tenderness. Musculoskeletal: No lower extremity edema.  Extremities warm and well perfused.  Neurologic:  Normal speech and language. No gross focal neurologic deficits are appreciated.  Skin:  Skin is warm and dry. No rash noted. Psychiatric: Mood and affect are normal. Speech and behavior are normal.  ____________________________________________   LABS (all labs ordered are listed, but only abnormal results are displayed)  Labs Reviewed  COMPREHENSIVE METABOLIC PANEL - Abnormal; Notable for the following components:       Result Value   Potassium 5.2 (*)    Chloride 96 (*)    Glucose, Bld 188 (*)    Creatinine, Ser 1.05 (*)    Calcium 8.4 (*)    GFR calc non Af Amer 50 (*)    GFR calc Af Amer 58 (*)    All other components within normal limits  CBC WITH DIFFERENTIAL/PLATELET - Abnormal; Notable for the following components:   WBC 14.8 (*)    RBC 3.53 (*)    Hemoglobin 10.9 (*)    HCT 34.3 (*)    Neutro Abs 9.6 (*)    Monocytes Absolute 2.4 (*)    Eosinophils Absolute 1.1 (*)    Abs Immature Granulocytes 0.08 (*)    All other components within normal limits  FIBRIN DERIVATIVES D-DIMER ([facility] ONLY) - Abnormal; Notable for the following components:   Fibrin derivatives D-dimer ([facility]) 1,275.39 (*)    All other components within normal limits  BRAIN NATRIURETIC PEPTIDE - Abnormal; Notable for the following components:   B Natriuretic Peptide 475.0 (*)    All other components within normal limits  TROPONIN I (HIGH SENSITIVITY) - Abnormal; Notable for the following components:   Troponin I (High Sensitivity) 35 (*)    All other components within normal limits  TROPONIN I (HIGH SENSITIVITY) - Abnormal; Notable for the following components:   Troponin I (High Sensitivity) 37 (*)    All other components within normal limits  RESPIRATORY PANEL BY RT PCR (FLU A&B, COVID)  RESPIRATORY PANEL BY PCR  EXPECTORATED SPUTUM ASSESSMENT W REFEX TO RESP CULTURE  LACTIC ACID, PLASMA  C-REACTIVE PROTEIN  FERRITIN  FIBRINOGEN  LACTATE DEHYDROGENASE  PROCALCITONIN  FIBRIN DERIVATIVES D-DIMER ([facility] ONLY)  CBC WITH DIFFERENTIAL/PLATELET  COMPREHENSIVE METABOLIC PANEL  STREP PNEUMONIAE URINARY ANTIGEN   ____________________________________________  EKG  ED ECG REPORT I, Arta Silence, the attending physician, personally viewed and interpreted this ECG.  Date: 09/14/2019 EKG Time: 2004 Rate: 83 Rhythm: Sinus rhythm with frequent PVCs and intermittent bigeminy QRS Axis: normal Intervals: normal ST/T  Wave abnormalities: normal Narrative Interpretation: no evidence of acute ischemia  ____________________________________________  RADIOLOGY  CXR: Faint residual patchy opacities bilaterally  ____________________________________________   PROCEDURES  Procedure(s) performed: No  Procedures  Critical Care performed: Yes  CRITICAL CARE Performed by: Arta Silence   Total critical care  time: 35 minutes  Critical care time was exclusive of separately billable procedures and treating other patients.  Critical care was necessary to treat or prevent imminent or life-threatening deterioration.  Critical care was time spent personally by me on the following activities: development of treatment plan with patient and/or surrogate as well as nursing, discussions with consultants, evaluation of patient's response to treatment, examination of patient, obtaining history from patient or surrogate, ordering and performing treatments and interventions, ordering and review of laboratory studies, ordering and review of radiographic studies, pulse oximetry and re-evaluation of patient's condition. ____________________________________________   INITIAL IMPRESSION / ASSESSMENT AND PLAN / ED COURSE  Pertinent labs & imaging results that were available during my care of the patient were reviewed by me and considered in my medical decision making (see chart for details).  83 year old female with PMH as noted above including CHF and COPD as well as recent admission for pneumonia and possible COVID-19 presents with worsening shortness of breath and increased oxygen requirement over the last few days since leaving the hospital.  I reviewed the past medical records in epic.  The patient was admitted from 12/10-12/21 with respiratory distress and hypoxia requiring increased supplemental oxygen.  The patient had a family exposure to COVID-19 and elevated D-dimer but tested negative for COVID-19.  She was  treated with antibiotics and steroids and discharged on her baseline 2 L of oxygen by nasal cannula.  On exam today, the patient has significantly increased work of breathing and is speaking in short sentences.  O2 saturation is in the mid 90s on 3 L and she is tachypneic.  Her other vital signs are normal.  The remainder of the exam is as described above.  Differential still includes COVID-19, bacterial pneumonia, viral bronchitis, influenza, or less likely cardiac etiology.  We will obtain a chest x-ray, repeat Covid and flu swab, lab work-up, and reassess.  I will give bronchodilators.  I anticipate that due to the increase in work of breathing and oxygen requirement that the patient will require readmission.  ----------------------------------------- 11:34 PM on 09/14/2019 -----------------------------------------  Chest x-ray shows continued infiltrates although somewhat improved.  The patient's WBC count is increasing although most of her other inflammatory markers are slightly decreased.  Given her increased oxygen requirement and worsening symptoms, I will proceed with admission.  I discussed her case with Dr. Roel Cluck from the hospitalist service.  ____________________________  Catherine Landry was evaluated in Emergency Department on 09/14/2019 for the symptoms described in the history of present illness. She was evaluated in the context of the global COVID-19 pandemic, which necessitated consideration that the patient might be at risk for infection with the SARS-CoV-2 virus that causes COVID-19. Institutional protocols and algorithms that pertain to the evaluation of patients at risk for COVID-19 are in a state of rapid change based on information released by regulatory bodies including the CDC and federal and state organizations. These policies and algorithms were followed during the patient's care in the ED.  ____________________________________________   FINAL CLINICAL  IMPRESSION(S) / ED DIAGNOSES  Final diagnoses:  SOB (shortness of breath)      NEW MEDICATIONS STARTED DURING THIS VISIT:  New Prescriptions   No medications on file     Note:  This document was prepared using Dragon voice recognition software and may include unintentional dictation errors.    Arta Silence, MD 09/14/19 [phone]

## 2019-09-14 NOTE — ED Triage Notes (Signed)
Patient arrives from [facility] with complaints of increased shortness of breath; EMS reports giving patient extra neb treatments at home without relief. Patient feels as though "something is draining in her throat". Patient currently on 3L with sats between 94-97%

## 2019-09-15 DIAGNOSIS — J9621 Acute and chronic respiratory failure with hypoxia: Secondary | ICD-10-CM | POA: Diagnosis present

## 2019-09-15 DIAGNOSIS — I130 Hypertensive heart and chronic kidney disease with heart failure and stage 1 through stage 4 chronic kidney disease, or unspecified chronic kidney disease: Secondary | ICD-10-CM | POA: Diagnosis present

## 2019-09-15 DIAGNOSIS — E875 Hyperkalemia: Secondary | ICD-10-CM | POA: Diagnosis present

## 2019-09-15 DIAGNOSIS — I248 Other forms of acute ischemic heart disease: Secondary | ICD-10-CM | POA: Diagnosis present

## 2019-09-15 DIAGNOSIS — R778 Other specified abnormalities of plasma proteins: Secondary | ICD-10-CM

## 2019-09-15 DIAGNOSIS — Z7189 Other specified counseling: Secondary | ICD-10-CM | POA: Diagnosis not present

## 2019-09-15 DIAGNOSIS — Z9049 Acquired absence of other specified parts of digestive tract: Secondary | ICD-10-CM | POA: Diagnosis not present

## 2019-09-15 DIAGNOSIS — Z9849 Cataract extraction status, unspecified eye: Secondary | ICD-10-CM | POA: Diagnosis not present

## 2019-09-15 DIAGNOSIS — N182 Chronic kidney disease, stage 2 (mild): Secondary | ICD-10-CM | POA: Diagnosis present

## 2019-09-15 DIAGNOSIS — Z905 Acquired absence of kidney: Secondary | ICD-10-CM | POA: Diagnosis not present

## 2019-09-15 DIAGNOSIS — M1990 Unspecified osteoarthritis, unspecified site: Secondary | ICD-10-CM | POA: Diagnosis present

## 2019-09-15 DIAGNOSIS — J9622 Acute and chronic respiratory failure with hypercapnia: Secondary | ICD-10-CM | POA: Diagnosis present

## 2019-09-15 DIAGNOSIS — J441 Chronic obstructive pulmonary disease with (acute) exacerbation: Secondary | ICD-10-CM | POA: Diagnosis present

## 2019-09-15 DIAGNOSIS — I5042 Chronic combined systolic (congestive) and diastolic (congestive) heart failure: Secondary | ICD-10-CM | POA: Diagnosis present

## 2019-09-15 DIAGNOSIS — K0889 Other specified disorders of teeth and supporting structures: Secondary | ICD-10-CM | POA: Diagnosis present

## 2019-09-15 DIAGNOSIS — I2510 Atherosclerotic heart disease of native coronary artery without angina pectoris: Secondary | ICD-10-CM | POA: Diagnosis present

## 2019-09-15 DIAGNOSIS — Z87891 Personal history of nicotine dependence: Secondary | ICD-10-CM | POA: Diagnosis not present

## 2019-09-15 DIAGNOSIS — I10 Essential (primary) hypertension: Secondary | ICD-10-CM | POA: Diagnosis not present

## 2019-09-15 DIAGNOSIS — J440 Chronic obstructive pulmonary disease with acute lower respiratory infection: Secondary | ICD-10-CM | POA: Diagnosis present

## 2019-09-15 DIAGNOSIS — Z8551 Personal history of malignant neoplasm of bladder: Secondary | ICD-10-CM | POA: Diagnosis not present

## 2019-09-15 DIAGNOSIS — Z85528 Personal history of other malignant neoplasm of kidney: Secondary | ICD-10-CM | POA: Diagnosis not present

## 2019-09-15 DIAGNOSIS — J129 Viral pneumonia, unspecified: Secondary | ICD-10-CM | POA: Diagnosis present

## 2019-09-15 DIAGNOSIS — Z66 Do not resuscitate: Secondary | ICD-10-CM | POA: Diagnosis present

## 2019-09-15 DIAGNOSIS — R0602 Shortness of breath: Secondary | ICD-10-CM | POA: Diagnosis present

## 2019-09-15 DIAGNOSIS — Z515 Encounter for palliative care: Secondary | ICD-10-CM

## 2019-09-15 DIAGNOSIS — J189 Pneumonia, unspecified organism: Secondary | ICD-10-CM

## 2019-09-15 DIAGNOSIS — Z20828 Contact with and (suspected) exposure to other viral communicable diseases: Secondary | ICD-10-CM | POA: Diagnosis present

## 2019-09-15 DIAGNOSIS — Z9981 Dependence on supplemental oxygen: Secondary | ICD-10-CM | POA: Diagnosis not present

## 2019-09-15 DIAGNOSIS — K219 Gastro-esophageal reflux disease without esophagitis: Secondary | ICD-10-CM | POA: Diagnosis present

## 2019-09-15 LAB — COMPREHENSIVE METABOLIC PANEL
ALT: 12 U/L (ref 0–44)
AST: 17 U/L (ref 15–41)
Albumin: 3.4 g/dL — ABNORMAL LOW (ref 3.5–5.0)
Alkaline Phosphatase: 43 U/L (ref 38–126)
Anion gap: 7 (ref 5–15)
BUN: 21 mg/dL (ref 8–23)
CO2: 33 mmol/L — ABNORMAL HIGH (ref 22–32)
Calcium: 8.5 mg/dL — ABNORMAL LOW (ref 8.9–10.3)
Chloride: 96 mmol/L — ABNORMAL LOW (ref 98–111)
Creatinine, Ser: 0.96 mg/dL (ref 0.44–1.00)
GFR calc Af Amer: >60 mL/min (ref >60)
GFR calc non Af Amer: 55 mL/min — ABNORMAL LOW (ref >60)
Glucose, Bld: 151 mg/dL — ABNORMAL HIGH (ref 70–99)
Potassium: 5.4 mmol/L — ABNORMAL HIGH (ref 3.5–5.1)
Sodium: 136 mmol/L (ref 135–145)
Total Bilirubin: 0.8 mg/dL (ref 0.3–1.2)
Total Protein: 6.4 g/dL — ABNORMAL LOW (ref 6.5–8.1)

## 2019-09-15 LAB — BLOOD GAS, ARTERIAL
Acid-Base Excess: 9.2 mmol/L — ABNORMAL HIGH (ref 0.0–2.0)
Acid-Base Excess: 8.5 mmol/L — ABNORMAL HIGH (ref 0.0–2.0)
Bicarbonate: 37.8 mmol/L — ABNORMAL HIGH (ref 20.0–28.0)
Bicarbonate: 37.8 mmol/L — ABNORMAL HIGH (ref 20.0–28.0)
Delivery systems: POSITIVE
Expiratory PAP: 5
FIO2: 0.4
FIO2: 0.32
Inspiratory PAP: 15
Mechanical Rate: 8
O2 Saturation: 96.4 %
O2 Saturation: 96.6 %
Patient temperature: 37
Patient temperature: 37
pCO2 arterial: 70 mmHg (ref 32.0–48.0)
pCO2 arterial: 75 mmHg (ref 32.0–48.0)
pH, Arterial: 7.34 — ABNORMAL LOW (ref 7.350–7.450)
pH, Arterial: 7.31 — ABNORMAL LOW (ref 7.350–7.450)
pO2, Arterial: 90 mmHg (ref 83.0–108.0)
pO2, Arterial: 94 mmHg (ref 83.0–108.0)

## 2019-09-15 LAB — BLOOD GAS, VENOUS
Acid-Base Excess: 10 mmol/L — ABNORMAL HIGH (ref 0.0–2.0)
Bicarbonate: 39.9 mmol/L — ABNORMAL HIGH (ref 20.0–28.0)
O2 Saturation: 47.4 %
Patient temperature: 37
pCO2, Ven: 81 mmHg (ref 44.0–60.0)
pH, Ven: 7.3 (ref 7.250–7.430)
pO2, Ven: <31 mmHg — CL (ref 32.0–45.0)

## 2019-09-15 LAB — BASIC METABOLIC PANEL
Anion gap: 10 (ref 5–15)
BUN: 23 mg/dL (ref 8–23)
CO2: 32 mmol/L (ref 22–32)
Calcium: 8.4 mg/dL — ABNORMAL LOW (ref 8.9–10.3)
Chloride: 94 mmol/L — ABNORMAL LOW (ref 98–111)
Creatinine, Ser: 1.01 mg/dL — ABNORMAL HIGH (ref 0.44–1.00)
GFR calc Af Amer: >60 mL/min (ref >60)
GFR calc non Af Amer: 52 mL/min — ABNORMAL LOW (ref >60)
Glucose, Bld: 118 mg/dL — ABNORMAL HIGH (ref 70–99)
Potassium: 5.3 mmol/L — ABNORMAL HIGH (ref 3.5–5.1)
Sodium: 136 mmol/L (ref 135–145)

## 2019-09-15 LAB — CBC WITH DIFFERENTIAL/PLATELET
Abs Immature Granulocytes: 0.13 10*3/uL — ABNORMAL HIGH (ref 0.00–0.07)
Basophils Absolute: 0 10*3/uL (ref 0.0–0.1)
Basophils Relative: 0 %
Eosinophils Absolute: 0.2 10*3/uL (ref 0.0–0.5)
Eosinophils Relative: 2 %
HCT: 34.5 % — ABNORMAL LOW (ref 36.0–46.0)
Hemoglobin: 10.7 g/dL — ABNORMAL LOW (ref 12.0–15.0)
Immature Granulocytes: 1 %
Lymphocytes Relative: 3 %
Lymphs Abs: 0.4 10*3/uL — ABNORMAL LOW (ref 0.7–4.0)
MCH: 30.4 pg (ref 26.0–34.0)
MCHC: 31 g/dL (ref 30.0–36.0)
MCV: 98 fL (ref 80.0–100.0)
Monocytes Absolute: 0.5 10*3/uL (ref 0.1–1.0)
Monocytes Relative: 4 %
Neutro Abs: 12.4 10*3/uL — ABNORMAL HIGH (ref 1.7–7.7)
Neutrophils Relative %: 90 %
Platelets: 162 10*3/uL (ref 150–400)
RBC: 3.52 MIL/uL — ABNORMAL LOW (ref 3.87–5.11)
RDW: 15.9 % — ABNORMAL HIGH (ref 11.5–15.5)
WBC: 13.7 10*3/uL — ABNORMAL HIGH (ref 4.0–10.5)
nRBC: 0 % (ref 0.0–0.2)

## 2019-09-15 LAB — STREP PNEUMONIAE URINARY ANTIGEN: Strep Pneumo Urinary Antigen: NEGATIVE

## 2019-09-15 LAB — RESPIRATORY PANEL BY PCR

## 2019-09-15 LAB — NA AND K (SODIUM & POTASSIUM), RAND UR
Potassium Urine: 41 mmol/L
Sodium, Ur: 75 mmol/L

## 2019-09-15 LAB — C-REACTIVE PROTEIN: CRP: 11.6 mg/dL — ABNORMAL HIGH (ref <1.0)

## 2019-09-15 LAB — FIBRIN DERIVATIVES D-DIMER (ARMC ONLY): Fibrin derivatives D-dimer (ARMC): 1856.35 ng/mL (FEU) — ABNORMAL HIGH (ref 0.00–499.00)

## 2019-09-15 LAB — TROPONIN I (HIGH SENSITIVITY): Troponin I (High Sensitivity): 32 ng/L — ABNORMAL HIGH (ref <18)

## 2019-09-15 LAB — PHOSPHORUS: Phosphorus: 3.4 mg/dL (ref 2.5–4.6)

## 2019-09-15 LAB — PROCALCITONIN: Procalcitonin: <0.1 ng/mL

## 2019-09-15 LAB — TSH: TSH: 2.033 u[IU]/mL (ref 0.350–4.500)

## 2019-09-15 LAB — MAGNESIUM: Magnesium: 2 mg/dL (ref 1.7–2.4)

## 2019-09-15 LAB — FERRITIN: Ferritin: 62 ng/mL (ref 11–307)

## 2019-09-15 LAB — LACTATE DEHYDROGENASE: LDH: 210 U/L — ABNORMAL HIGH (ref 98–192)

## 2019-09-15 MED ORDER — ACETAMINOPHEN 325 MG PO TABS
650.0000 mg | ORAL_TABLET | Freq: Four times a day (QID) | ORAL | Status: DC | PRN
  Administered 2019-09-17 – 2019-09-19 (×2): 650 mg via ORAL
  Filled 2019-09-15 (×2): qty 2

## 2019-09-15 MED ORDER — ACETAMINOPHEN 650 MG RE SUPP: 650.0000 mg | Freq: Four times a day (QID) | RECTAL | Status: DC | PRN

## 2019-09-15 MED ORDER — FUROSEMIDE 10 MG/ML IJ SOLN
40.0000 mg | INTRAMUSCULAR | Status: AC
  Administered 2019-09-15: 40 mg via INTRAVENOUS
  Filled 2019-09-15: qty 4

## 2019-09-15 MED ORDER — SODIUM CHLORIDE 0.9% FLUSH
3.0000 mL | Freq: Two times a day (BID) | INTRAVENOUS | Status: DC
  Administered 2019-09-15 – 2019-09-19 (×8): 3 mL via INTRAVENOUS

## 2019-09-15 MED ORDER — BUSPIRONE HCL 15 MG PO TABS
7.5000 mg | ORAL_TABLET | Freq: Two times a day (BID) | ORAL | Status: DC
  Administered 2019-09-15 – 2019-09-16 (×2): 7.5 mg via ORAL
  Filled 2019-09-15 (×2): qty 2
  Filled 2019-09-15 (×2): qty 1

## 2019-09-15 MED ORDER — ASCORBIC ACID 500 MG PO TABS
500.0000 mg | ORAL_TABLET | Freq: Every day | ORAL | Status: DC
  Administered 2019-09-15: 500 mg via ORAL
  Filled 2019-09-15 (×2): qty 1

## 2019-09-15 MED ORDER — ENOXAPARIN SODIUM 40 MG/0.4ML ~~LOC~~ SOLN
40.0000 mg | SUBCUTANEOUS | Status: DC
  Administered 2019-09-15: 40 mg via SUBCUTANEOUS
  Filled 2019-09-15: qty 0.4

## 2019-09-15 MED ORDER — ENOXAPARIN SODIUM 40 MG/0.4ML ~~LOC~~ SOLN
30.0000 mg | SUBCUTANEOUS | Status: DC
  Filled 2019-09-15: qty 0.4

## 2019-09-15 MED ORDER — CLOPIDOGREL BISULFATE 75 MG PO TABS
75.0000 mg | ORAL_TABLET | Freq: Every day | ORAL | Status: DC
  Administered 2019-09-15 – 2019-09-16 (×2): 75 mg via ORAL
  Filled 2019-09-15 (×2): qty 1

## 2019-09-15 MED ORDER — ALBUTEROL SULFATE (2.5 MG/3ML) 0.083% IN NEBU
2.5000 mg | INHALATION_SOLUTION | RESPIRATORY_TRACT | Status: DC | PRN
  Administered 2019-09-15 – 2019-09-16 (×3): 2.5 mg via RESPIRATORY_TRACT
  Filled 2019-09-15 (×2): qty 3

## 2019-09-15 MED ORDER — SODIUM ZIRCONIUM CYCLOSILICATE 10 G PO PACK
10.0000 g | PACK | Freq: Once | ORAL | Status: AC
  Administered 2019-09-15: 10 g via ORAL
  Filled 2019-09-15: qty 1

## 2019-09-15 MED ORDER — METOPROLOL SUCCINATE ER 25 MG PO TB24
12.5000 mg | ORAL_TABLET | Freq: Every morning | ORAL | Status: DC
  Administered 2019-09-15 – 2019-09-16 (×2): 12.5 mg via ORAL
  Filled 2019-09-15: qty 0.5
  Filled 2019-09-15: qty 1

## 2019-09-15 MED ORDER — LOSARTAN POTASSIUM 50 MG PO TABS
50.0000 mg | ORAL_TABLET | Freq: Every morning | ORAL | Status: DC
  Administered 2019-09-15: 50 mg via ORAL
  Filled 2019-09-15: qty 1

## 2019-09-15 MED ORDER — ONDANSETRON HCL 4 MG/2ML IJ SOLN: 4.0000 mg | Freq: Four times a day (QID) | INTRAMUSCULAR | Status: DC | PRN

## 2019-09-15 MED ORDER — PREDNISONE 20 MG PO TABS: 40.0000 mg | ORAL_TABLET | Freq: Every day | ORAL | Status: DC

## 2019-09-15 MED ORDER — HYDROCODONE-ACETAMINOPHEN 5-325 MG PO TABS: 1.0000 | ORAL_TABLET | ORAL | Status: DC | PRN

## 2019-09-15 MED ORDER — THEOPHYLLINE ER 100 MG PO CP24
300.0000 mg | ORAL_CAPSULE | Freq: Every day | ORAL | Status: DC
  Administered 2019-09-15 – 2019-09-16 (×2): 300 mg via ORAL
  Filled 2019-09-15 (×2): qty 3

## 2019-09-15 MED ORDER — FUROSEMIDE 20 MG PO TABS
20.0000 mg | ORAL_TABLET | Freq: Every day | ORAL | Status: DC
  Administered 2019-09-15 – 2019-09-16 (×2): 20 mg via ORAL
  Filled 2019-09-15 (×2): qty 1

## 2019-09-15 MED ORDER — ASPIRIN EC 81 MG PO TBEC
81.0000 mg | DELAYED_RELEASE_TABLET | Freq: Every morning | ORAL | Status: DC
  Administered 2019-09-15 – 2019-09-16 (×2): 81 mg via ORAL
  Filled 2019-09-15 (×2): qty 1

## 2019-09-15 MED ORDER — POLYETHYLENE GLYCOL 3350 17 G PO PACK
17.0000 g | PACK | Freq: Every day | ORAL | Status: DC
  Administered 2019-09-15 – 2019-09-18 (×4): 17 g via ORAL
  Filled 2019-09-15 (×6): qty 1

## 2019-09-15 MED ORDER — SODIUM CHLORIDE 0.9% FLUSH
3.0000 mL | Freq: Two times a day (BID) | INTRAVENOUS | Status: DC
  Administered 2019-09-15 – 2019-09-18 (×4): 3 mL via INTRAVENOUS

## 2019-09-15 MED ORDER — SODIUM CHLORIDE 0.9 % IV SOLN
500.0000 mg | INTRAVENOUS | Status: DC
  Administered 2019-09-15 – 2019-09-16 (×2): 500 mg via INTRAVENOUS
  Filled 2019-09-15 (×2): qty 500

## 2019-09-15 MED ORDER — MOMETASONE FURO-FORMOTEROL FUM 200-5 MCG/ACT IN AERO
2.0000 | INHALATION_SPRAY | Freq: Two times a day (BID) | RESPIRATORY_TRACT | Status: DC
  Administered 2019-09-15 – 2019-09-16 (×3): 2 via RESPIRATORY_TRACT
  Filled 2019-09-15: qty 8.8

## 2019-09-15 MED ORDER — ONDANSETRON HCL 4 MG PO TABS: 4.0000 mg | ORAL_TABLET | Freq: Four times a day (QID) | ORAL | Status: DC | PRN

## 2019-09-15 MED ORDER — METHYLPREDNISOLONE SODIUM SUCC 125 MG IJ SOLR
60.0000 mg | Freq: Two times a day (BID) | INTRAMUSCULAR | Status: DC
  Administered 2019-09-16: 60 mg via INTRAVENOUS
  Filled 2019-09-15: qty 2

## 2019-09-15 MED ORDER — SODIUM CHLORIDE 0.9% FLUSH
3.0000 mL | INTRAVENOUS | Status: DC | PRN
  Administered 2019-09-16: 3 mL via INTRAVENOUS

## 2019-09-15 MED ORDER — SODIUM CHLORIDE 0.9 % IV SOLN
250.0000 mL | INTRAVENOUS | Status: DC | PRN
  Administered 2019-09-16: 250 mL via INTRAVENOUS

## 2019-09-15 MED ORDER — PANTOPRAZOLE SODIUM 40 MG PO TBEC
40.0000 mg | DELAYED_RELEASE_TABLET | Freq: Two times a day (BID) | ORAL | Status: DC
  Administered 2019-09-15 – 2019-09-16 (×3): 40 mg via ORAL
  Filled 2019-09-15 (×3): qty 1

## 2019-09-15 NOTE — ED Notes (Addendum)
Patient not tolerating BiPAP, feels choked and more short of breath. Complaining of feeling hot all over. Placed back on nasal cannula and messaged NP

## 2019-09-15 NOTE — Progress Notes (Signed)
PHARMACIST - PHYSICIAN COMMUNICATION  CONCERNING:  Enoxaparin (Lovenox) for DVT Prophylaxis   RECOMMENDATION: Patient was prescribed enoxaprin 30mg  q24 hours for VTE prophylaxis.   Filed Weights   09/14/19 2007  Weight: 123 lb (55.8 kg)   Body mass index is 22.5 kg/m.  Estimated Creatinine Clearance: 36.4 mL/min (by C-G formula based on SCr of 0.96 mg/dL).   Patient is candidate for enoxaparin 40mg  every 24 hours based on CrCl >37ml/min and  Weight > 45kg   DESCRIPTION: Pharmacy has adjusted enoxaparin dose per [facility] policy.  Patient is now receiving enoxaparin 40mg  every 24 hours.   Pernell Dupre, PharmD, BCPS Clinical Pharmacist 09/15/2019 8:44 AM

## 2019-09-15 NOTE — ED Notes (Signed)
CO2 on ABG worsening, this RN called respiratory to place BiPAP back on patient and notified NP of results

## 2019-09-15 NOTE — ED Notes (Signed)
Patient asking for food, informed patient that she cannot eat while she is on BiPAP due to possibility of aspiration but as we wean her off, she can eat when it is off

## 2019-09-15 NOTE — ED Notes (Signed)
Respiratory at bedside to draw ABG. Patient had taken BiPAP off when this RN entered room, complained of feeling hot and strangled. Switched to nasal cannula and given albuterol; patient has a productive cough and was given robitussin

## 2019-09-15 NOTE — ED Notes (Signed)
Discussed with NP, will leave patient on BiPAP for now per ABG

## 2019-09-15 NOTE — ED Notes (Signed)
Patient tolerating BiPAP well.

## 2019-09-15 NOTE — ED Notes (Signed)
No complaints at this time.

## 2019-09-15 NOTE — ED Notes (Signed)
Patient's brief soiled, changed brief and applied purewick to obtain sample. Patient more comfortable with BiPAP on. Repositioned and covered with blankets. Lights dimmed

## 2019-09-15 NOTE — Progress Notes (Signed)
PHARMACY -  BRIEF ANTIBIOTIC NOTE   Pharmacy has received consult(s) for Vancomycin and Cefepime from an ED provider.  The patient's profile has been reviewed for ht/wt/allergies/indication/available labs.    One time order(s) placed for Cefepime 2gm and Vancomycin 1gm  Further antibiotics/pharmacy consults should be ordered by admitting physician if indicated.                       Thank you, Hart Robinsons A 09/15/2019  2:47 AM

## 2019-09-15 NOTE — Progress Notes (Signed)
Palliative: Catherine Landry is sitting up on the stretcher in the ED eating a sandwich.  She greets me making and keeping eye contact.  She looks better than anticipated.  She is alert and oriented, able to make her needs known.  There is no family at bedside at this time.  Catherine Landry was seen by PMT earlier this month, and only recently discharged.  She tells me that she was having trouble breathing when she returned to her ALF due to heating and air conditioning issues.  She shares that her breathing worsened, and therefore her anxiety worsened.  We talked about symptom management for breathlessness.  We also talked about at home palliative services to assist with symptom management.  We talked about the use of morphine for breathlessness, and at first Catherine Landry is a little concerned.  I reassure her that morphine is used to help with breathlessness, would not be addictive for her, and does not mean end-of-life for her.  She is reassured.    Catherine Landry tells me that her goal is to see her husband again.   She agrees for me to call her brother/HC POA, [name].  Trip and I talked about Catherine Landry's current status, readmission, and outpatient palliative services.  Tripp is agreeable to outpatient palliative services.   Plan:   Continue to treat the treatable but no CPR, no intubation.  Agreeable for further use of BiPAP as needed Return to ALF with outpatient palliative services, transition to hospice when ready/able.  47 minutes [name], NP Palliative Medicine Team Team Phone # [phone] Greater than 50% of this time was spent counseling and coordinating care related to the above assessment and plan.

## 2019-09-15 NOTE — ED Notes (Signed)
Patient on BiPAP

## 2019-09-15 NOTE — ED Notes (Signed)
Notified NP that patient has oral meds that will not be given due to patient being on BiPAP, no changes at this time

## 2019-09-15 NOTE — Progress Notes (Signed)
[place] [facility] [place] NAME: Catherine Landry    MR#:  [id number]  DATE OF BIRTH:  24-Oct-1935  SUBJECTIVE:  CHIEF COMPLAINT:   Chief Complaint  Patient presents with  . Shortness of Breath  On BiPAP when I saw her, shortness of breath improving, tachypneic REVIEW OF SYSTEMS:  Review of Systems  Constitutional: Positive for malaise/fatigue. Negative for diaphoresis, fever and weight loss.  HENT: Negative for ear discharge, ear pain, hearing loss, nosebleeds, sore throat and tinnitus.   Eyes: Negative for blurred vision and pain.  Respiratory: Positive for shortness of breath. Negative for cough, hemoptysis and wheezing.   Cardiovascular: Negative for chest pain, palpitations, orthopnea and leg swelling.  Gastrointestinal: Negative for abdominal pain, blood in stool, constipation, diarrhea, heartburn, nausea and vomiting.  Genitourinary: Negative for dysuria, frequency and urgency.  Musculoskeletal: Negative for back pain and myalgias.  Skin: Negative for itching and rash.  Neurological: Negative for dizziness, tingling, tremors, focal weakness, seizures, weakness and headaches.  Psychiatric/Behavioral: Negative for depression. The patient is nervous/anxious.     DRUG ALLERGIES:   Allergies  Allergen Reactions  . Nitrofurantoin Diarrhea and Other (See Comments)    Headache   VITALS:  Blood pressure (!) 127/57, pulse 89, temperature 98.4 F (36.9 C), temperature source Oral, resp. rate (!) 28, height 5\' 2"  (1.575 m), weight 55.8 kg, SpO2 99 %. PHYSICAL EXAMINATION:  Physical Exam Constitutional:      General: She is in acute distress.     Appearance: She is underweight. She is ill-appearing.  HENT:     Head: Normocephalic and atraumatic.  Eyes:     Conjunctiva/sclera: Conjunctivae normal.     Pupils: Pupils are equal, round, and reactive to light.  Neck:     Thyroid: No thyromegaly.     Trachea: No tracheal deviation.  Cardiovascular:   Rate and Rhythm: Normal rate and regular rhythm.     Heart sounds: Normal heart sounds.  Pulmonary:     Effort: Tachypnea, accessory muscle usage and respiratory distress present.     Breath sounds: Normal breath sounds. No wheezing.  Chest:     Chest wall: No tenderness.  Abdominal:     General: Bowel sounds are normal. There is no distension.     Palpations: Abdomen is soft.     Tenderness: There is no abdominal tenderness.  Musculoskeletal:        General: Normal range of motion.     Cervical back: Normal range of motion and neck supple.  Skin:    General: Skin is warm and dry.     Findings: No rash.  Neurological:     Mental Status: She is alert and oriented to person, place, and time.     Cranial Nerves: No cranial nerve deficit.    LABORATORY PANEL:  Female CBC Recent Labs  Lab 09/15/19 0423  WBC 13.7*  HGB 10.7*  HCT 34.5*  PLT 162   ------------------------------------------------------------------------------------------------------------------ Chemistries  Recent Labs  Lab 09/15/19 0423  NA 136  K 5.4*  CL 96*  CO2 33*  GLUCOSE 151*  BUN 21  CREATININE 0.96  CALCIUM 8.5*  MG 2.0  AST 17  ALT 12  ALKPHOS 43  BILITOT 0.8   RADIOLOGY:  DG Chest Portable 1 View  Result Date: 09/14/2019 CLINICAL DATA:  Recurrent shortness of breath EXAM: PORTABLE CHEST 1 VIEW COMPARISON:  Radiograph 09/08/2019 FINDINGS: Hyperinflation of the lungs with flattening of the  diaphragms. There are some faint residual patchy opacities within the lungs which may reflect ongoing infection/inflammation or sequela there of. No pneumothorax. No visible effusion. The aorta is calcified. The remaining cardiomediastinal contours are unremarkable. No acute osseous or soft tissue abnormality. Degenerative changes are present in the imaged spine and shoulders. Surgical clips noted in the left upper quadrant. IMPRESSION: Faint residual patchy opacities within the lungs which may reflect  ongoing infection/inflammation or sequela thereof. Aortic Atherosclerosis (ICD10-I70.0). Electronically Signed   By: Lovena Le M.D.   On: 09/14/2019 20:31   ASSESSMENT AND PLAN:  83 y.o. female with medical history significant of  CAD sp stent placement, HTN, COPD on 2L O2, CKD stage 2, chronic diastolic CHF, GERD, renal cancer status post left nephrectomy, recently admitted for presumed COVID vs COPD exacerbation - Presented with worsening shortness of breath and tachypnea  Admitted for COPD exacerbation  Present on Admission: . COPD exacerbation ([place]) -  - continue steroids, Antibiotics, Albuterol, Mucinex - scheduled duoneb, Breo or Dulera at discharge -Initially required BiPAP in the emergency department, now able to wean her off to 3 L nasal cannula oxygen  . Acute on chronic respiratory failure with hypoxia (HCC) and hypercarbia Complicated history with known exposure to Covid in the beginning of December.  Currently has tested negative for Covid continue precautions for now.  Given that patient already been using nebulizers and BiPAP  . Elevated troponin -due to demand ischemia secondary to hypoxia/increased work of breathing  . Multifocal pneumonia -likely viral pt has recently been treated with antibiotics no new fever, WBC is elevated but she was on steroids In ER got Cefepime and vanc - Normal procalcitonin, will stop antibiotics  . Essential hypertension chronic stable stable continue home medications  CKD 2 - chronic avoid nephrotoxic medications  Hyperkalemia -We will use Lasix which should help correct this   DVT prophylaxis:    Lovenox   We will consult palliative care.  I do believe she will benefit from palliative/hospice.  Overall very poor prognosis  Her husband is still recovering from Covid and is at rehab.  She refused rehab at last discharge and went to assisted living/home place of   All the records are reviewed and case discussed with  Care Management/Social Worker. Management plans discussed with the patient, nursing and they are in agreement.  CODE STATUS: DNR  TOTAL TIME TAKING CARE OF THIS PATIENT: 35 minutes.   More than 50% of the time was spent in counseling/coordination of care: YES  POSSIBLE D/C IN 2-3 DAYS, DEPENDING ON CLINICAL CONDITION.   Max Sane M.D on 09/15/2019 at 12:11 PM  Between 7am to 6pm - Pager - [phone]  After 6pm go to [email or web address] - password [email or web address]  [facility]   CC: Primary care physician; Tracie Harrier, MD  Note: This dictation was prepared with Dragon dictation along with smaller phrase technology. Any transcriptional errors that result from this process are unintentional.

## 2019-09-15 NOTE — ED Notes (Signed)
Pt removed from bipap to eat breakfast. Pt Sat levels 97-99% on 3L.

## 2019-09-15 NOTE — ED Notes (Signed)
Respiratory at bedside to collect ABG

## 2019-09-16 ENCOUNTER — Other Ambulatory Visit: Payer: Self-pay

## 2019-09-16 LAB — COMPREHENSIVE METABOLIC PANEL
ALT: 12 U/L (ref 0–44)
AST: 15 U/L (ref 15–41)
Albumin: 3.2 g/dL — ABNORMAL LOW (ref 3.5–5.0)
Alkaline Phosphatase: 44 U/L (ref 38–126)
Anion gap: 9 (ref 5–15)
BUN: 35 mg/dL — ABNORMAL HIGH (ref 8–23)
CO2: 34 mmol/L — ABNORMAL HIGH (ref 22–32)
Calcium: 8.4 mg/dL — ABNORMAL LOW (ref 8.9–10.3)
Chloride: 94 mmol/L — ABNORMAL LOW (ref 98–111)
Creatinine, Ser: 1.35 mg/dL — ABNORMAL HIGH (ref 0.44–1.00)
GFR calc Af Amer: 43 mL/min — ABNORMAL LOW (ref >60)
GFR calc non Af Amer: 37 mL/min — ABNORMAL LOW (ref >60)
Glucose, Bld: 188 mg/dL — ABNORMAL HIGH (ref 70–99)
Potassium: 5.5 mmol/L — ABNORMAL HIGH (ref 3.5–5.1)
Sodium: 137 mmol/L (ref 135–145)
Total Bilirubin: 0.4 mg/dL (ref 0.3–1.2)
Total Protein: 6.4 g/dL — ABNORMAL LOW (ref 6.5–8.1)

## 2019-09-16 LAB — CBC WITH DIFFERENTIAL/PLATELET
Abs Immature Granulocytes: 0.05 10*3/uL (ref 0.00–0.07)
Basophils Absolute: 0 10*3/uL (ref 0.0–0.1)
Basophils Relative: 0 %
Eosinophils Absolute: 0 10*3/uL (ref 0.0–0.5)
Eosinophils Relative: 0 %
HCT: 29.6 % — ABNORMAL LOW (ref 36.0–46.0)
Hemoglobin: 9.6 g/dL — ABNORMAL LOW (ref 12.0–15.0)
Immature Granulocytes: 1 %
Lymphocytes Relative: 2 %
Lymphs Abs: 0.2 10*3/uL — ABNORMAL LOW (ref 0.7–4.0)
MCH: 30.4 pg (ref 26.0–34.0)
MCHC: 32.4 g/dL (ref 30.0–36.0)
MCV: 93.7 fL (ref 80.0–100.0)
Monocytes Absolute: 0.4 10*3/uL (ref 0.1–1.0)
Monocytes Relative: 5 %
Neutro Abs: 7.2 10*3/uL (ref 1.7–7.7)
Neutrophils Relative %: 92 %
Platelets: 143 10*3/uL — ABNORMAL LOW (ref 150–400)
RBC: 3.16 MIL/uL — ABNORMAL LOW (ref 3.87–5.11)
RDW: 15.6 % — ABNORMAL HIGH (ref 11.5–15.5)
WBC: 7.8 10*3/uL (ref 4.0–10.5)
nRBC: 0 % (ref 0.0–0.2)

## 2019-09-16 LAB — TROPONIN I (HIGH SENSITIVITY): Troponin I (High Sensitivity): 29 ng/L — ABNORMAL HIGH (ref <18)

## 2019-09-16 LAB — MRSA PCR SCREENING: MRSA by PCR: NEGATIVE

## 2019-09-16 MED ORDER — POLYVINYL ALCOHOL 1.4 % OP SOLN
1.0000 [drp] | Freq: Four times a day (QID) | OPHTHALMIC | Status: DC | PRN
  Filled 2019-09-16: qty 15

## 2019-09-16 MED ORDER — HEPARIN SODIUM (PORCINE) 5000 UNIT/ML IJ SOLN: 5000.0000 [IU] | Freq: Three times a day (TID) | INTRAMUSCULAR | Status: DC

## 2019-09-16 MED ORDER — DIPHENHYDRAMINE HCL 50 MG/ML IJ SOLN: 25.0000 mg | INTRAMUSCULAR | Status: DC | PRN

## 2019-09-16 MED ORDER — MORPHINE SULFATE (PF) 2 MG/ML IV SOLN: 1.0000 mg | INTRAVENOUS | Status: DC | PRN

## 2019-09-16 MED ORDER — ACETAMINOPHEN 325 MG PO TABS: 650.0000 mg | ORAL_TABLET | Freq: Four times a day (QID) | ORAL | Status: DC | PRN

## 2019-09-16 MED ORDER — AZITHROMYCIN 250 MG PO TABS: 500.0000 mg | ORAL_TABLET | Freq: Every day | ORAL | Status: DC

## 2019-09-16 MED ORDER — GLYCOPYRROLATE 0.2 MG/ML IJ SOLN
0.2000 mg | INTRAMUSCULAR | Status: DC | PRN
  Filled 2019-09-16: qty 1

## 2019-09-16 MED ORDER — SENNOSIDES-DOCUSATE SODIUM 8.6-50 MG PO TABS
2.0000 | ORAL_TABLET | Freq: Two times a day (BID) | ORAL | Status: DC
  Administered 2019-09-16 – 2019-09-18 (×4): 2 via ORAL
  Filled 2019-09-16 (×6): qty 2

## 2019-09-16 MED ORDER — GLYCOPYRROLATE 1 MG PO TABS
1.0000 mg | ORAL_TABLET | ORAL | Status: DC | PRN
  Filled 2019-09-16: qty 1

## 2019-09-16 MED ORDER — ORAL CARE MOUTH RINSE
15.0000 mL | Freq: Two times a day (BID) | OROMUCOSAL | Status: DC
  Administered 2019-09-16 – 2019-09-18 (×5): 15 mL via OROMUCOSAL

## 2019-09-16 MED ORDER — ACETAMINOPHEN 650 MG RE SUPP: 650.0000 mg | Freq: Four times a day (QID) | RECTAL | Status: DC | PRN

## 2019-09-16 MED ORDER — HALOPERIDOL LACTATE 5 MG/ML IJ SOLN: 2.5000 mg | INTRAMUSCULAR | Status: DC | PRN

## 2019-09-16 NOTE — Progress Notes (Signed)
OT Cancellation Note  Patient Details Name: Catherine Landry MRN: [id number] DOB: 11-19-1935   Cancelled Treatment:    Reason Eval/Treat Not Completed: Other (comment)  OT consult received and chart reviewed. Upon speaking with nursing, pt is now on comfort care. RN requests OT complete order at this time. Please re-consult if appropriate. Thank you.   Gerrianne Scale, [place], OTR/L ascom [phone] 09/16/19, 4:12 PM

## 2019-09-16 NOTE — Progress Notes (Addendum)
[place] at [place] NAME: Catherine Landry    MR#:  [id number]  DATE OF BIRTH:  1936/06/02  SUBJECTIVE:  CHIEF COMPLAINT:   Chief Complaint  Patient presents with  . Shortness of Breath  Seem to be actively dying, coughing a lot earlier and Benadryl seem to have helped REVIEW OF SYSTEMS:  Review of Systems  Constitutional: Positive for malaise/fatigue. Negative for diaphoresis, fever and weight loss.  HENT: Negative for ear discharge, ear pain, hearing loss, nosebleeds, sore throat and tinnitus.   Eyes: Negative for blurred vision and pain.  Respiratory: Positive for cough and shortness of breath. Negative for hemoptysis and wheezing.   Cardiovascular: Negative for chest pain, palpitations, orthopnea and leg swelling.  Gastrointestinal: Negative for abdominal pain, blood in stool, constipation, diarrhea, heartburn, nausea and vomiting.  Genitourinary: Negative for dysuria, frequency and urgency.  Musculoskeletal: Negative for back pain and myalgias.  Skin: Negative for itching and rash.  Neurological: Negative for dizziness, tingling, tremors, focal weakness, seizures, weakness and headaches.  Psychiatric/Behavioral: Negative for depression. The patient is nervous/anxious.     DRUG ALLERGIES:   Allergies  Allergen Reactions  . Nitrofurantoin Diarrhea and Other (See Comments)    Headache   VITALS:  Blood pressure (!) 105/50, pulse 100, temperature 97.8 F (36.6 C), temperature source Oral, resp. rate (!) 24, height 5\' 2"  (1.575 m), weight 56.3 kg, SpO2 94 %. PHYSICAL EXAMINATION:  Physical Exam Constitutional:      General: She is in acute distress.     Appearance: She is underweight. She is ill-appearing.  HENT:     Head: Normocephalic and atraumatic.  Eyes:     Conjunctiva/sclera: Conjunctivae normal.     Pupils: Pupils are equal, round, and reactive to light.  Neck:     Thyroid: No thyromegaly.     Trachea: No tracheal deviation.    Cardiovascular:     Rate and Rhythm: Normal rate and regular rhythm.     Heart sounds: Normal heart sounds.  Pulmonary:     Effort: Tachypnea, accessory muscle usage, respiratory distress and retractions present.     Breath sounds: Decreased air movement present. Examination of the right-lower field reveals rhonchi. Examination of the left-lower field reveals rhonchi. Rhonchi present. No wheezing.  Chest:     Chest wall: No tenderness.  Abdominal:     General: Bowel sounds are normal. There is no distension.     Palpations: Abdomen is soft.     Tenderness: There is no abdominal tenderness.  Musculoskeletal:        General: Normal range of motion.     Cervical back: Normal range of motion and neck supple.  Skin:    General: Skin is warm and dry.     Findings: No rash.  Neurological:     Mental Status: She is alert and oriented to person, place, and time.     Cranial Nerves: No cranial nerve deficit.    LABORATORY PANEL:  Female CBC Recent Labs  Lab 09/16/19 0523  WBC 7.8  HGB 9.6*  HCT 29.6*  PLT 143*   ------------------------------------------------------------------------------------------------------------------ Chemistries  Recent Labs  Lab 09/15/19 0423 09/16/19 0523  NA 136 137  K 5.4* 5.5*  CL 96* 94*  CO2 33* 34*  GLUCOSE 151* 188*  BUN 21 35*  CREATININE 0.96 1.35*  CALCIUM 8.5* 8.4*  MG 2.0  --   AST 17 15  ALT 12 12  ALKPHOS  43 44  BILITOT 0.8 0.4   RADIOLOGY:  No results found. ASSESSMENT AND PLAN:  83 y.o. female with medical history significant of  CAD sp stent placement, HTN, COPD on 2L O2, CKD stage 2, chronic diastolic CHF, GERD, renal cancer status post left nephrectomy, recently admitted for presumed COVID vs COPD exacerbation - Presented with worsening shortness of breath and tachypnea  Admitted for COPD exacerbation  Present on Admission: . COPD exacerbation (HCC) - On 3 L nasal cannula oxygen  . Acute on chronic respiratory failure  with hypoxia (HCC) and hypercarbia Complicated history with known exposure to Covid in the beginning of December. Currently has tested negative for Covid continue precautions for now.    . Elevated troponin -due to demand ischemia   . Multifocal pneumonia - viral - Normal procalcitonin  . Essential hypertension  CKD 2   Hyperkalemia   I had discussion with patient's brother who is her healthcare power of attorney.  He is in agreement with comfort care orders.  He does realize that patient is dying and has very poor prognosis.  Her husband is still recovering from Covid and is at rehab.  I have requested hospice screening, she may benefit from hospice home placement if she qualifies  Placed comfort care orders -her life expectancy is less than 2 weeks  All the records are reviewed and case discussed with Care Management/Social Worker. Management plans discussed with the patient, nursing, brother over phone and they are in agreement.  CODE STATUS: DNR  TOTAL TIME TAKING CARE OF THIS PATIENT: 35 minutes.   More than 50% of the time was spent in counseling/coordination of care: YES  POSSIBLE D/C IN 1-2 DAYS, DEPENDING ON CLINICAL CONDITION.  And hospice screening   Max Sane M.D on 09/16/2019 at 1:53 PM  Between 7am to 6pm - Pager - [phone]  After 6pm go to [email or web address] - password [email or web address]  [facility]   CC: Primary care physician; Tracie Harrier, MD  Note: This dictation was prepared with Dragon dictation along with smaller phrase technology. Any transcriptional errors that result from this process are unintentional.

## 2019-09-16 NOTE — Evaluation (Signed)
Clinical/Bedside Swallow Evaluation Patient Details  Name: Catherine Landry MRN: [id number] Date of Birth: 05/21/1936  Today's Date: 09/16/2019 Time: SLP Start Time (ACUTE ONLY): [id number] SLP Stop Time (ACUTE ONLY): 1325 SLP Time Calculation (min) (ACUTE ONLY): 40 min  Past Medical History:  Past Medical History:  Diagnosis Date  . Arthritis   . Asthma   . Bladder cancer ([place]) 2015  . Cancer [facility]) 2001   kidney left   . CHF (congestive heart failure) ([place])   . COPD (chronic obstructive pulmonary disease) ([place])   . GERD (gastroesophageal reflux disease)   . Gout   . History of bladder cancer   . History of kidney cancer   . Hypertension   . Raynaud's disease   . Renal insufficiency    Past Surgical History:  Past Surgical History:  Procedure Laterality Date  . APPENDECTOMY    . BACK SURGERY    . CATARACT EXTRACTION    . COLONOSCOPY WITH PROPOFOL N/A 10/15/2016   Procedure: COLONOSCOPY WITH PROPOFOL;  Surgeon: Lollie Sails, MD;  Location: [facility] ENDOSCOPY;  Service: Endoscopy;  Laterality: N/A;  . ESOPHAGOGASTRODUODENOSCOPY (EGD) WITH PROPOFOL N/A 10/15/2016   Procedure: ESOPHAGOGASTRODUODENOSCOPY (EGD) WITH PROPOFOL;  Surgeon: Lollie Sails, MD;  Location: [facility] ENDOSCOPY;  Service: Endoscopy;  Laterality: N/A;  . EYE SURGERY Left 12/2018  . NEPHRECTOMY    . RIGHT/LEFT HEART CATH AND CORONARY ANGIOGRAPHY N/A 06/21/2019   Procedure: RIGHT/LEFT HEART CATH AND CORONARY ANGIOGRAPHY;  Surgeon: Teodoro Spray, MD;  Location: [place] CV LAB;  Service: Cardiovascular;  Laterality: N/A;   HPI:  Pt is a 83 y.o. female with PMH including Raynaud's disease, GERD, CHF, CAD, CKD stage 2, and COPD (normally on 2 L O2 at home) who presents with worsening shortness of breath over the last several days associated with cough and with throat discomfort ever since she was discharged from the hospital several days ago.  She has had to go up to 3 L by nasal cannula because her oxygen level  was low.  She reports feeling weak but denies acute pain.  Although initially tested negative for Covid bilateral infiltrates on her chest x-ray was worrisome for Covid pneumonia.    Assessment / Plan / Recommendation Clinical Impression  Pt appears to present w/ adequate oropharyngeal phase swallowing function w/ No overt clinical s/s of aspiration noted during trials of thin liquids and graham crackers/ice cream after her recent Lunch meal. Pt's risk for prandial aspiration is Reduced when following general aspiration precautions. Pt is on [place] O2 support and took her time w/ trials during self-feeding. Pt fed self trials of thin liquids via straw, purees, soft solids. No overt s/s of aspiration noted; no decline in vocal quality or respiratory status during/post trials. Oral phase c/b adequate bolus manipulation and control for timely A-P transfer and oral clearing. OM exam appeared [facility] for strength/ROM w/ bolus management; no unilateral weakness noted. Speech clear.  Recommend continue current Regular diet of foods of choice; Thin liquids, rest Breaks as needed. General aspiration precautions; general Reflux precautions. Pills in Puree w/ NSG IF needed for safer swallowing. Recommend rest breaks at meals; less distractions/talking to lessen any increased exertion/SOB w/ the workload. No further skilled ST services indicated re: swallowing. Pt agreed. NSG has not noted any swallowing difficulties during meals or swallowing Pills w/ water per NSG report. Noted Palliative Care f/u and notes in chart.  SLP Visit Diagnosis: Dysphagia, unspecified (R13.10)    Aspiration Risk  (reduced  following general precs.)    Diet Recommendation  Regular diet (tougher meats cut w/ gravy for conservation of energy, if ordered); Thin liquids. General aspiration precautions. Reflux precautions baseline.   Medication Administration: Whole meds with liquid(but in a puree IF needed for easier swallowing)    Other   Recommendations Recommended Consults: (Dietician f/u) Oral Care Recommendations: Oral care BID;Patient independent with oral care Other Recommendations: (n/a)   Follow up Recommendations None      Frequency and Duration (n/a)  (n/a)       Prognosis Prognosis for Safe Diet Advancement: Good Barriers to Reach Goals: (none)      Swallow Study   General Date of Onset: 09/14/19 HPI: Pt is a 83 y.o. female with PMH including Raynaud's disease, GERD, CHF, CAD, CKD stage 2, and COPD (normally on 2 L O2 at home) who presents with worsening shortness of breath over the last several days associated with cough and with throat discomfort ever since she was discharged from the hospital several days ago.  She has had to go up to 3 L by nasal cannula because her oxygen level was low.  She reports feeling weak but denies acute pain.  Although initially tested negative for Covid bilateral infiltrates on her chest x-ray was worrisome for Covid pneumonia.  Type of Study: Bedside Swallow Evaluation Previous Swallow Assessment: none reported Diet Prior to this Study: Regular;Thin liquids Temperature Spikes Noted: No(wbc 7.8) Respiratory Status: Nasal cannula(3-4L) History of Recent Intubation: No Behavior/Cognition: Alert;Cooperative;Pleasant mood Oral Cavity Assessment: Within Functional Limits Oral Care Completed by SLP: Recent completion by staff Oral Cavity - Dentition: Adequate natural dentition Vision: Functional for self-feeding Self-Feeding Abilities: Able to feed self;Needs set up(min) Patient Positioning: Upright in bed Baseline Vocal Quality: Normal Volitional Cough: Strong Volitional Swallow: Able to elicit    Oral/Motor/Sensory Function Overall Oral Motor/Sensory Function: Within functional limits   Ice Chips Ice chips: Not tested   Thin Liquid Thin Liquid: Within functional limits Presentation: Self Fed;Straw(5 trials)    Nectar Thick Nectar Thick Liquid: Not tested   Honey Thick  Honey Thick Liquid: Not tested   Puree Puree: Within functional limits Presentation: Spoon;Self Fed(3 trials)   Solid     Solid: Within functional limits Presentation: Self Fed(3 trials)        Orinda Kenner, MS, CCC-SLP Elie Gragert 09/16/2019,1:32 PM

## 2019-09-16 NOTE — Care Management (Signed)
RN CM: Updated by MD would need Hospice House for placement.   RN CM: Call to [facility]. Requested referral be faxed to [phone] including H/P and palliative notes. Needing clarification on isolation as facility has no isolation rooms and is concerned about COVID exposure. States they will review information and follow up Monday as no beds are available at this time however possible opening in next day or two.

## 2019-09-16 NOTE — Evaluation (Signed)
Physical Therapy Evaluation Patient Details Name: Catherine Landry MRN: [id number] DOB: 1936/07/22 Today's Date: 09/16/2019   History of Present Illness  83 yo female admitted with SOB after presenting to ED with complaints of weakness  tachycardic, tachypneic and saturating in 60s on 2L O2, PMHx: COPD, CHF, GERD, renal cancer s/p left nephrectomy, hypoxia, stent with CAD, HTN, CKD2   Clinical Impression  Pt was admitted with O2 sats down and now is referred to PT for ck of mobility.  Pt is at 82% baseline on bed, got to sit up and with one stand up to side step three steps dropped to 55%.  Recovered on the bed but is unable to tolerate mobility without losing significantly on sats and energy.  Will expect her to need to go to SNF for recovery, and will look for her to make further progress given that her complaints with these values are minimal.  Has been recently hypoxic and is possibly more accustomed to the feeling of this state.  Follow acutely for progression of mobility and strengthening as tolerated and as her system will permit.    Follow Up Recommendations SNF    Equipment Recommendations  Rolling walker with 5" wheels    Recommendations for Other Services       Precautions / Restrictions Precautions Precautions: Fall Precaution Comments: watch O2 sats Restrictions Weight Bearing Restrictions: No      Mobility  Bed Mobility Overal bed mobility: Needs Assistance Bed Mobility: Supine to Sit;Sit to Supine     Supine to sit: Min assist Sit to supine: Mod assist   General bed mobility comments: more help back to bed with legs lifted  Transfers Overall transfer level: Needs assistance Equipment used: Rolling walker (2 wheeled) Transfers: Sit to/from Stand Sit to Stand: Min assist         General transfer comment: min assist to power up and then used RW to steady  Ambulation/Gait Ambulation/Gait assistance: Min guard;Min assist Gait Distance (Feet): 3  Feet Assistive device: Rolling walker (2 wheeled) Gait Pattern/deviations: Step-to pattern Gait velocity: decreased Gait velocity interpretation: <1.31 ft/sec, indicative of household ambulator General Gait Details: stepped to side of bed with RW and then sat  Stairs            Wheelchair Mobility    Modified Rankin (Stroke Patients Only)       Balance Overall balance assessment: Needs assistance Sitting-balance support: Feet supported;No upper extremity supported Sitting balance-Leahy Scale: Fair Sitting balance - Comments: steady sitting EOB preferring at least single UE support   Standing balance support: Bilateral upper extremity supported;During functional activity Standing balance-Leahy Scale: Fair                               Pertinent Vitals/Pain Pain Assessment: No/denies pain    Home Living Family/patient expects to be discharged to:: Private residence(has decided against ALF) Living Arrangements: Spouse/significant other Available Help at Discharge: Family;Available 24 hours/day;Other (Comment) Type of Home: House Home Access: Stairs to enter Entrance Stairs-Rails: Right Entrance Stairs-Number of Steps: 1 Home Layout: One level Home Equipment: Walker - 4 wheels;Cane - quad;Cane - single point;Hand held shower head;Tub bench Additional Comments: Owns a tub transfer bench that pt's spouse uses    Prior Function Level of Independence: Independent         Comments: completely independent with ADL's     Hand Dominance   Dominant Hand: Right    Extremity/Trunk  Assessment   Upper Extremity Assessment Upper Extremity Assessment: Generalized weakness    Lower Extremity Assessment Lower Extremity Assessment: Generalized weakness    Cervical / Trunk Assessment Cervical / Trunk Assessment: Kyphotic  Communication   Communication: No difficulties  Cognition Arousal/Alertness: Awake/alert Behavior During Therapy: WFL for tasks  assessed/performed Overall Cognitive Status: Within Functional Limits for tasks assessed                                 General Comments: mild SOB with activity but not impairing communication      General Comments General comments (skin integrity, edema, etc.): pt was assisted to stand and sidestep, note prestanding sats were 82% and post were 55% with resting recovery.  Pt is unable to sustain any gait activity without desaturation    Exercises     Assessment/Plan    PT Assessment Patient needs continued PT services  PT Problem List Decreased strength;Decreased mobility;Decreased range of motion;Decreased activity tolerance;Decreased balance;Decreased knowledge of use of DME;Cardiopulmonary status limiting activity       PT Treatment Interventions DME instruction;Therapeutic exercise;Gait training;Balance training;Stair training;Neuromuscular re-education;Functional mobility training;Therapeutic activities;Patient/family education    PT Goals (Current goals can be found in the Care Plan section)  Acute Rehab PT Goals Patient Stated Goal: get stronger PT Goal Formulation: With patient Time For Goal Achievement: 09/30/19 Potential to Achieve Goals: Good    Frequency Min 2X/week   Barriers to discharge Decreased caregiver support home alone and in a staired entrance home    Co-evaluation               AM-PAC PT "6 Clicks" Mobility  Outcome Measure Help needed turning from your back to your side while in a flat bed without using bedrails?: A Little Help needed moving from lying on your back to sitting on the side of a flat bed without using bedrails?: A Little Help needed moving to and from a bed to a chair (including a wheelchair)?: A Little Help needed standing up from a chair using your arms (e.g., wheelchair or bedside chair)?: A Little Help needed to walk in hospital room?: A Little Help needed climbing 3-5 steps with a railing? : A Lot 6 Click  Score: 17    End of Session Equipment Utilized During Treatment: Oxygen Activity Tolerance: Patient limited by fatigue;Treatment limited secondary to medical complications (Comment) Patient left: in bed;with call bell/phone within reach;with bed alarm set Nurse Communication: Mobility status PT Visit Diagnosis: Muscle weakness (generalized) (M62.81);Difficulty in walking, not elsewhere classified (R26.2)    Time: 1206-1220 PT Time Calculation (min) (ACUTE ONLY): 14 min   Charges:   PT Evaluation $PT Eval Moderate Complexity: 1 Mod         Ramond Dial 09/16/2019, 2:09 PM   Mee Hives, PT MS Acute Rehab Dept. Number: [place]

## 2019-09-16 NOTE — Progress Notes (Signed)
Spoke with dr. Manuella Ghazi to make aware potassium is 5.5. also patient c/o constipation. Per md start on senokot s 2 tablets bid. Will continue to monitor closely

## 2019-09-17 LAB — COMPREHENSIVE METABOLIC PANEL
ALT: 13 U/L (ref 0–44)
AST: 17 U/L (ref 15–41)
Albumin: 3.2 g/dL — ABNORMAL LOW (ref 3.5–5.0)
Alkaline Phosphatase: 39 U/L (ref 38–126)
Anion gap: 9 (ref 5–15)
BUN: 30 mg/dL — ABNORMAL HIGH (ref 8–23)
CO2: 33 mmol/L — ABNORMAL HIGH (ref 22–32)
Calcium: 8.7 mg/dL — ABNORMAL LOW (ref 8.9–10.3)
Chloride: 94 mmol/L — ABNORMAL LOW (ref 98–111)
Creatinine, Ser: 1.24 mg/dL — ABNORMAL HIGH (ref 0.44–1.00)
GFR calc Af Amer: 47 mL/min — ABNORMAL LOW (ref >60)
GFR calc non Af Amer: 41 mL/min — ABNORMAL LOW (ref >60)
Glucose, Bld: 106 mg/dL — ABNORMAL HIGH (ref 70–99)
Potassium: 5.2 mmol/L — ABNORMAL HIGH (ref 3.5–5.1)
Sodium: 136 mmol/L (ref 135–145)
Total Bilirubin: 0.7 mg/dL (ref 0.3–1.2)
Total Protein: 6.3 g/dL — ABNORMAL LOW (ref 6.5–8.1)

## 2019-09-17 LAB — CBC WITH DIFFERENTIAL/PLATELET
Abs Immature Granulocytes: 0.06 10*3/uL (ref 0.00–0.07)
Basophils Absolute: 0 10*3/uL (ref 0.0–0.1)
Basophils Relative: 0 %
Eosinophils Absolute: 0 10*3/uL (ref 0.0–0.5)
Eosinophils Relative: 0 %
HCT: 31.9 % — ABNORMAL LOW (ref 36.0–46.0)
Hemoglobin: 10.4 g/dL — ABNORMAL LOW (ref 12.0–15.0)
Immature Granulocytes: 1 %
Lymphocytes Relative: 8 %
Lymphs Abs: 1 10*3/uL (ref 0.7–4.0)
MCH: 30.8 pg (ref 26.0–34.0)
MCHC: 32.6 g/dL (ref 30.0–36.0)
MCV: 94.4 fL (ref 80.0–100.0)
Monocytes Absolute: 2 10*3/uL — ABNORMAL HIGH (ref 0.1–1.0)
Monocytes Relative: 16 %
Neutro Abs: 9.8 10*3/uL — ABNORMAL HIGH (ref 1.7–7.7)
Neutrophils Relative %: 75 %
Platelets: 166 10*3/uL (ref 150–400)
RBC: 3.38 MIL/uL — ABNORMAL LOW (ref 3.87–5.11)
RDW: 15.8 % — ABNORMAL HIGH (ref 11.5–15.5)
WBC: 12.9 10*3/uL — ABNORMAL HIGH (ref 4.0–10.5)
nRBC: 0 % (ref 0.0–0.2)

## 2019-09-17 MED ORDER — IPRATROPIUM-ALBUTEROL 0.5-2.5 (3) MG/3ML IN SOLN
3.0000 mL | Freq: Four times a day (QID) | RESPIRATORY_TRACT | Status: DC
  Administered 2019-09-17: 3 mL via RESPIRATORY_TRACT
  Filled 2019-09-17: qty 3

## 2019-09-17 MED ORDER — MORPHINE SULFATE (PF) 2 MG/ML IV SOLN
2.0000 mg | INTRAVENOUS | Status: DC | PRN
  Administered 2019-09-17: 2 mg via INTRAVENOUS
  Filled 2019-09-17: qty 1

## 2019-09-17 MED ORDER — MAGNESIUM HYDROXIDE 400 MG/5ML PO SUSP
30.0000 mL | Freq: Every day | ORAL | Status: DC | PRN
  Administered 2019-09-17: 30 mL via ORAL
  Filled 2019-09-17: qty 30

## 2019-09-17 MED ORDER — ALBUTEROL SULFATE HFA 108 (90 BASE) MCG/ACT IN AERS
2.0000 | INHALATION_SPRAY | RESPIRATORY_TRACT | Status: DC | PRN
  Administered 2019-09-17: 2 via RESPIRATORY_TRACT
  Filled 2019-09-17 (×2): qty 6.7

## 2019-09-17 MED ORDER — SODIUM POLYSTYRENE SULFONATE 15 GM/60ML PO SUSP
30.0000 g | Freq: Once | ORAL | Status: AC
  Administered 2019-09-17: 30 g via RECTAL
  Filled 2019-09-17: qty 120

## 2019-09-17 MED ORDER — IPRATROPIUM-ALBUTEROL 0.5-2.5 (3) MG/3ML IN SOLN
3.0000 mL | Freq: Four times a day (QID) | RESPIRATORY_TRACT | Status: DC | PRN
  Administered 2019-09-18 – 2019-09-19 (×4): 3 mL via RESPIRATORY_TRACT
  Filled 2019-09-17 (×4): qty 3

## 2019-09-17 NOTE — Progress Notes (Signed)
[place] [facility] [place] NAME: Catherine Landry    MR#:  [id number]  DATE OF BIRTH:  Jun 18, 1936  SUBJECTIVE:  CHIEF COMPLAINT:   Chief Complaint  Patient presents with  . Shortness of Breath  requesting nebs treatment. REVIEW OF SYSTEMS:  Review of Systems  Constitutional: Positive for malaise/fatigue. Negative for diaphoresis, fever and weight loss.  HENT: Negative for ear discharge, ear pain, hearing loss, nosebleeds, sore throat and tinnitus.   Eyes: Negative for blurred vision and pain.  Respiratory: Positive for cough and shortness of breath. Negative for hemoptysis and wheezing.   Cardiovascular: Negative for chest pain, palpitations, orthopnea and leg swelling.  Gastrointestinal: Negative for abdominal pain, blood in stool, constipation, diarrhea, heartburn, nausea and vomiting.  Genitourinary: Negative for dysuria, frequency and urgency.  Musculoskeletal: Negative for back pain and myalgias.  Skin: Negative for itching and rash.  Neurological: Negative for dizziness, tingling, tremors, focal weakness, seizures, weakness and headaches.  Psychiatric/Behavioral: Negative for depression. The patient is nervous/anxious.     DRUG ALLERGIES:   Allergies  Allergen Reactions  . Nitrofurantoin Diarrhea and Other (See Comments)    Headache   VITALS:  Blood pressure (!) 147/97, pulse 73, temperature 97.9 F (36.6 C), temperature source Oral, resp. rate 20, height 5\' 2"  (1.575 m), weight 56.3 kg, SpO2 94 %. PHYSICAL EXAMINATION:  Physical Exam Constitutional:      General: She is in acute distress.     Appearance: She is underweight. She is ill-appearing.  HENT:     Head: Normocephalic and atraumatic.  Eyes:     Conjunctiva/sclera: Conjunctivae normal.     Pupils: Pupils are equal, round, and reactive to light.  Neck:     Thyroid: No thyromegaly.     Trachea: No tracheal deviation.  Cardiovascular:     Rate and Rhythm: Normal rate and regular  rhythm.     Heart sounds: Normal heart sounds.  Pulmonary:     Effort: Tachypnea, accessory muscle usage, respiratory distress and retractions present.     Breath sounds: Decreased air movement present. Examination of the right-lower field reveals rhonchi. Examination of the left-lower field reveals rhonchi. Rhonchi present. No wheezing.  Chest:     Chest wall: No tenderness.  Abdominal:     General: Bowel sounds are normal. There is no distension.     Palpations: Abdomen is soft.     Tenderness: There is no abdominal tenderness.  Musculoskeletal:        General: Normal range of motion.     Cervical back: Normal range of motion and neck supple.  Skin:    General: Skin is warm and dry.     Findings: No rash.  Neurological:     Mental Status: She is alert and oriented to person, place, and time.     Cranial Nerves: No cranial nerve deficit.    LABORATORY PANEL:  Female CBC Recent Labs  Lab 09/17/19 0540  WBC 12.9*  HGB 10.4*  HCT 31.9*  PLT 166   ------------------------------------------------------------------------------------------------------------------ Chemistries  Recent Labs  Lab 09/15/19 0423 09/15/19 1503 09/17/19 0540  NA 136  --  136  K 5.4*  --  5.2*  CL 96*  --  94*  CO2 33*  --  33*  GLUCOSE 151*  --  106*  BUN 21  --  30*  CREATININE 0.96  --  1.24*  CALCIUM 8.5*  --  8.7*  MG 2.0  --   --  AST 17   < > 17  ALT 12   < > 13  ALKPHOS 43   < > 39  BILITOT 0.8   < > 0.7   < > = values in this interval not displayed.   RADIOLOGY:  No results found. ASSESSMENT AND PLAN:  83 y.o. female with medical history significant of  CAD sp stent placement, HTN, COPD on 2L O2, CKD stage 2, chronic diastolic CHF, GERD, renal cancer status post left nephrectomy, recently admitted for presumed COVID vs COPD exacerbation - Presented with worsening shortness of breath and tachypnea  Admitted for COPD exacerbation  Present on Admission: . COPD exacerbation (HCC) -  On 3 L nasal cannula oxygen  . Acute on chronic respiratory failure with hypoxia (HCC) and hypercarbia Complicated history with known exposure to Covid in the beginning of December. Currently has tested negative for Covid continue precautions for now.    . Elevated troponin -due to demand ischemia   . Multifocal pneumonia - viral - Normal procalcitonin  . Essential hypertension  CKD 2   Hyperkalemia   Family is requesting Hospice. Preferably Hospice Home if beds available  Her husband is still recovering from Covid and is at rehab.  I have requested hospice screening, she may benefit from hospice home placement if she qualifies  Placed comfort care orders -her life expectancy is less than 2 weeks  All the records are reviewed and case discussed with Care Management/Social Worker. Management plans discussed with the patient, nursing, brother over phone and they are in agreement.  CODE STATUS: DNR  TOTAL TIME TAKING CARE OF THIS PATIENT: 35 minutes.   More than 50% of the time was spent in counseling/coordination of care: YES  POSSIBLE D/C IN 1-2 DAYS, DEPENDING ON CLINICAL CONDITION.  And hospice screening   Max Sane M.D on 09/17/2019 at 5:48 PM  Between 7am to 6pm - Pager - [phone]  After 6pm go to [email or web address] - password [email or web address]  [facility]   CC: Primary care physician; Tracie Harrier, MD  Note: This dictation was prepared with Dragon dictation along with smaller phrase technology. Any transcriptional errors that result from this process are unintentional.

## 2019-09-17 NOTE — TOC Progression Note (Signed)
Transition of Care [facility]) - Progression Note    Patient Details  Name: Catherine Landry MRN: [id number] Date of Birth: 1936/04/23  [facility]) CM/SW Contact  Ross Ludwig, [place] Phone Number: 09/17/2019, 8:05 PM  Clinical Narrative:    Patient's clinicals have been faxed to [facility] for hospice facility placement.        Expected Discharge Plan and Services                                                 Social Determinants of Health (SDOH) Interventions    Readmission Risk Interventions Readmission Risk Prevention Plan 09/05/2019 04/30/2019  Transportation Screening - Complete  PCP or Specialist Appt within 5-7 Days - Complete  PCP or Specialist Appt within 3-5 Days Complete -  Home Care Screening - Complete  Medication Review (RN CM) - Complete  HRI or Home Care Consult Complete -  Social Work Consult for Recovery Care Planning/Counseling Complete -  Palliative Care Screening Complete -  Some recent data might be hidden

## 2019-09-17 NOTE — Progress Notes (Signed)
Nutrition Brief Note  RD received consult for assessment of nutrition requirements/status. Chart reviewed. Patient has transitioned to comfort care.   No further nutrition interventions warranted at this time. Please re-consult RD as needed.   Jacklynn Barnacle, MS, RD, LDN Office: [phone] Pager: [phone] After Hours/Weekend Pager: [phone]

## 2019-09-17 NOTE — Plan of Care (Signed)
  Problem: Coping: Goal: Psychosocial and spiritual needs will be supported Outcome: Progressing   Problem: Coping: Goal: Ability to identify and develop effective coping behavior will improve Outcome: Progressing   Problem: Respiratory: Goal: Will maintain a patent airway Outcome: Not Progressing Note: Patient is dyspneic at rest.

## 2019-09-18 LAB — COMPREHENSIVE METABOLIC PANEL
ALT: 12 U/L (ref 0–44)
AST: 15 U/L (ref 15–41)
Albumin: 2.9 g/dL — ABNORMAL LOW (ref 3.5–5.0)
Alkaline Phosphatase: 39 U/L (ref 38–126)
Anion gap: 9 (ref 5–15)
BUN: 26 mg/dL — ABNORMAL HIGH (ref 8–23)
CO2: 37 mmol/L — ABNORMAL HIGH (ref 22–32)
Calcium: 8.2 mg/dL — ABNORMAL LOW (ref 8.9–10.3)
Chloride: 93 mmol/L — ABNORMAL LOW (ref 98–111)
Creatinine, Ser: 1.09 mg/dL — ABNORMAL HIGH (ref 0.44–1.00)
GFR calc Af Amer: 55 mL/min — ABNORMAL LOW (ref >60)
GFR calc non Af Amer: 48 mL/min — ABNORMAL LOW (ref >60)
Glucose, Bld: 84 mg/dL (ref 70–99)
Potassium: 4.2 mmol/L (ref 3.5–5.1)
Sodium: 139 mmol/L (ref 135–145)
Total Bilirubin: 0.8 mg/dL (ref 0.3–1.2)
Total Protein: 5.8 g/dL — ABNORMAL LOW (ref 6.5–8.1)

## 2019-09-18 LAB — CBC WITH DIFFERENTIAL/PLATELET
Abs Immature Granulocytes: 0.04 K/uL (ref 0.00–0.07)
Basophils Absolute: 0 K/uL (ref 0.0–0.1)
Basophils Relative: 0 %
Eosinophils Absolute: 0.4 K/uL (ref 0.0–0.5)
Eosinophils Relative: 7 %
HCT: 32.6 % — ABNORMAL LOW (ref 36.0–46.0)
Hemoglobin: 10.4 g/dL — ABNORMAL LOW (ref 12.0–15.0)
Immature Granulocytes: 1 %
Lymphocytes Relative: 23 %
Lymphs Abs: 1.6 K/uL (ref 0.7–4.0)
MCH: 30.3 pg (ref 26.0–34.0)
MCHC: 31.9 g/dL (ref 30.0–36.0)
MCV: 95 fL (ref 80.0–100.0)
Monocytes Absolute: 1.2 K/uL — ABNORMAL HIGH (ref 0.1–1.0)
Monocytes Relative: 17 %
Neutro Abs: 3.6 K/uL (ref 1.7–7.7)
Neutrophils Relative %: 52 %
Platelets: 142 K/uL — ABNORMAL LOW (ref 150–400)
RBC: 3.43 MIL/uL — ABNORMAL LOW (ref 3.87–5.11)
RDW: 15.9 % — ABNORMAL HIGH (ref 11.5–15.5)
WBC: 6.8 K/uL (ref 4.0–10.5)
nRBC: 0 % (ref 0.0–0.2)

## 2019-09-18 NOTE — Care Management Important Message (Signed)
Important Message  Patient Details  Name: Eleonor Karim MRN: [id number] Date of Birth: Nov 24, 1935   Medicare Important Message Given:  Yes  Verbally reviewed with patient's brother, Elicia Lamp, at [phone].  Aware of right.  Requested copy be emailed to: [email or web address]  Confirmed email.  Copy of Medicare IM sent securely to address provided.    Dannette Barbara 09/18/2019, 1:53 PM

## 2019-09-18 NOTE — Progress Notes (Addendum)
New referral for [facility] hospice home received on 12/27. Patient information reviewed by hospice medical director. Patient appears appropriate for hospice services, but not inpatient hospice. Per chart note review and speaking with nursing staff, patient has had an improvement in her appetite and is alert and oriented. She continues to require oxygen at 3 liters and is using nebulizer treatments PRN for her dyspnea, she has declined the use of morphine per staff RN.  After discussion with CSW Evette Cristal, plan is now for patient to return to [facility] with the support of hospice services. She will need EMS trnasport Referral updated. [facility] has agreed for patinet to return with hospice services. Telephone call placed to patient's brother Fredderick Phenix to initiated education regarding hospice services. Message let with writer's contact information, expect a return call. No New DME needs per Randall Hiss, plan for patient to discharge back to [facility] tomorrow. Writer spoke via telephone to patient's brother Mr. Maggie Font to initiate education regarding hospice services, philosophy and team approach to care with understanding voiced.  Hospital care team has been update.Referral updated. Flo Shanks BSN, RN, [place] [phone]

## 2019-09-18 NOTE — Progress Notes (Signed)
[place] at [place] NAME: Catherine Landry    MR#:  [id number]  DATE OF BIRTH:  10-Jul-1936  SUBJECTIVE:  CHIEF COMPLAINT:   Chief Complaint  Patient presents with  . Shortness of Breath  seems comfortable. Wanted breathing treatment and some morphine REVIEW OF SYSTEMS:  Review of Systems  Constitutional: Positive for malaise/fatigue. Negative for diaphoresis, fever and weight loss.  HENT: Negative for ear discharge, ear pain, hearing loss, nosebleeds, sore throat and tinnitus.   Eyes: Negative for blurred vision and pain.  Respiratory: Positive for cough and shortness of breath. Negative for hemoptysis and wheezing.   Cardiovascular: Negative for chest pain, palpitations, orthopnea and leg swelling.  Gastrointestinal: Negative for abdominal pain, blood in stool, constipation, diarrhea, heartburn, nausea and vomiting.  Genitourinary: Negative for dysuria, frequency and urgency.  Musculoskeletal: Negative for back pain and myalgias.  Skin: Negative for itching and rash.  Neurological: Negative for dizziness, tingling, tremors, focal weakness, seizures, weakness and headaches.  Psychiatric/Behavioral: Negative for depression.    DRUG ALLERGIES:   Allergies  Allergen Reactions  . Nitrofurantoin Diarrhea and Other (See Comments)    Headache   VITALS:  Blood pressure (!) 113/52, pulse (!) 49, temperature 98 F (36.7 C), temperature source Oral, resp. rate (!) 24, height 5\' 2"  (1.575 m), weight 55.9 kg, SpO2 97 %. PHYSICAL EXAMINATION:  Physical Exam Constitutional:      General: She is in acute distress.     Appearance: She is underweight. She is ill-appearing.  HENT:     Head: Normocephalic and atraumatic.  Eyes:     Conjunctiva/sclera: Conjunctivae normal.     Pupils: Pupils are equal, round, and reactive to light.  Neck:     Thyroid: No thyromegaly.     Trachea: No tracheal deviation.  Cardiovascular:     Rate and Rhythm: Normal rate  and regular rhythm.     Heart sounds: Normal heart sounds.  Pulmonary:     Effort: Tachypnea, accessory muscle usage, respiratory distress and retractions present.     Breath sounds: Decreased air movement present. Examination of the right-lower field reveals rhonchi. Examination of the left-lower field reveals rhonchi. Rhonchi present. No wheezing.  Chest:     Chest wall: No tenderness.  Abdominal:     General: Bowel sounds are normal. There is no distension.     Palpations: Abdomen is soft.     Tenderness: There is no abdominal tenderness.  Musculoskeletal:        General: Normal range of motion.     Cervical back: Normal range of motion and neck supple.  Skin:    General: Skin is warm and dry.     Findings: No rash.  Neurological:     Mental Status: She is alert and oriented to person, place, and time.     Cranial Nerves: No cranial nerve deficit.    LABORATORY PANEL:  Female CBC Recent Labs  Lab 09/18/19 0811  WBC 6.8  HGB 10.4*  HCT 32.6*  PLT 142*   ------------------------------------------------------------------------------------------------------------------ Chemistries  Recent Labs  Lab 09/15/19 0423 09/15/19 1503 09/18/19 0811  NA 136  --  139  K 5.4*  --  4.2  CL 96*  --  93*  CO2 33*  --  37*  GLUCOSE 151*  --  84  BUN 21  --  26*  CREATININE 0.96  --  1.09*  CALCIUM 8.5*  --  8.2*  MG  2.0  --   --   AST 17   < > 15  ALT 12   < > 12  ALKPHOS 43   < > 39  BILITOT 0.8   < > 0.8   < > = values in this interval not displayed.   RADIOLOGY:  No results found. ASSESSMENT AND PLAN:  83 y.o. female with medical history significant of  CAD sp stent placement, HTN, COPD on 2L O2, CKD stage 2, chronic diastolic CHF, GERD, renal cancer status post left nephrectomy, recently admitted for presumed COVID vs COPD exacerbation - Presented with worsening shortness of breath and tachypnea  Admitted for COPD exacerbation  Present on Admission: . COPD exacerbation  (HCC) - On 3 L nasal cannula oxygen  . Acute on chronic respiratory failure with hypoxia (HCC) and hypercarbia Complicated history with known exposure to Covid in the beginning of December. Currently has tested negative for Covid continue precautions for now.    . Elevated troponin -due to demand ischemia   . Multifocal pneumonia - viral - Normal procalcitonin  . Essential hypertension  CKD 2   Hyperkalemia   Plan for Hospice at [facility] at D/C tomorrow  All the records are reviewed and case discussed with Care Management/Social Worker. Management plans discussed with the patient, nursing and they are in agreement.  CODE STATUS: DNR  TOTAL TIME TAKING CARE OF THIS PATIENT: 35 minutes.   More than 50% of the time was spent in counseling/coordination of care: YES  POSSIBLE D/C IN 1 DAYS, DEPENDING ON CLINICAL CONDITION.    Max Sane M.D on 09/18/2019 at 8:27 PM  Between 7am to 6pm - Pager - [phone]  After 6pm go to [email or web address] - password [email or web address]  [facility]   CC: Primary care physician; Tracie Harrier, MD  Note: This dictation was prepared with Dragon dictation along with smaller phrase technology. Any transcriptional errors that result from this process are unintentional.

## 2019-09-18 NOTE — TOC Progression Note (Signed)
Transition of Care [facility]) - Progression Note    Patient Details  Name: Breniyah Laurita MRN: [id number] Date of Birth: [birth year]  Transition of Care [facility]) CM/SW Contact  Ross Ludwig, [place] Phone Number: 09/18/2019, 5:50 PM  Clinical Narrative:     This CSW talked to [place] at [place] and she said patient can return with hospice to follow at facility pending negative Covid test. Patient's husband will be discharging to the ALF potentially on Friday so they can be together. [facility]'s medical director reviewed patient, and patient does not qualify for hospice facility and hospice facility can not take patient on airborne precautions.  Per ALF director patient has anxiety, and does sometimes have panic attacks, she is on 3L oxygen chronic.  CSW spoke to patient's brother Fredderick Phenix and discussed discharge plan for patient to return to [facility] with hospice services in place.  Patient's brother and HCPOA is in agreement to plan.  CSW made referral to Santiago Glad from [place], and she will work on referral for hospice services at ALF.  Santiago Glad spoke to patient's brother and explained hospice services to him.  CSW to continue to follow patient's progress, as long as patient is medically ready for discharge and orders have been received patient should be able to discharge tomorrow back to ALF pending negative result from Covid test.          Expected Discharge Plan and Services  Patient to return back to [facility]F with hospice services.                                               Social Determinants of Health (SDOH) Interventions    Readmission Risk Interventions Readmission Risk Prevention Plan 09/05/2019 04/30/2019  Transportation Screening - Complete  PCP or Specialist Appt within 5-7 Days - Complete  PCP or Specialist Appt within 3-5 Days Complete -  Home Care Screening - Complete  Medication Review (RN CM) - Complete  HRI or Home Care Consult Complete -   Social Work Consult for Recovery Care Planning/Counseling Complete -  Palliative Care Screening Complete -  Some recent data might be hidden

## 2019-09-18 NOTE — Progress Notes (Signed)
PT Cancellation Note  Patient Details Name: Catherine Landry MRN: [id number] DOB: [birth year]   Cancelled Treatment:    Reason Eval/Treat Not Completed: Other (comment)- patient planning to go to hospice facility. PT discontinued.     Audi Wettstein 09/18/2019, 10:05 AM

## 2019-09-19 DIAGNOSIS — I10 Essential (primary) hypertension: Secondary | ICD-10-CM

## 2019-09-19 LAB — COMPREHENSIVE METABOLIC PANEL
ALT: 11 U/L (ref 0–44)
AST: 12 U/L — ABNORMAL LOW (ref 15–41)
Albumin: 3 g/dL — ABNORMAL LOW (ref 3.5–5.0)
Alkaline Phosphatase: 42 U/L (ref 38–126)
Anion gap: 3 — ABNORMAL LOW (ref 5–15)
BUN: 22 mg/dL (ref 8–23)
CO2: 42 mmol/L — ABNORMAL HIGH (ref 22–32)
Calcium: 8.3 mg/dL — ABNORMAL LOW (ref 8.9–10.3)
Chloride: 94 mmol/L — ABNORMAL LOW (ref 98–111)
Creatinine, Ser: 1.16 mg/dL — ABNORMAL HIGH (ref 0.44–1.00)
GFR calc Af Amer: 51 mL/min — ABNORMAL LOW (ref >60)
GFR calc non Af Amer: 44 mL/min — ABNORMAL LOW (ref >60)
Glucose, Bld: 100 mg/dL — ABNORMAL HIGH (ref 70–99)
Potassium: 4.4 mmol/L (ref 3.5–5.1)
Sodium: 139 mmol/L (ref 135–145)
Total Bilirubin: 0.6 mg/dL (ref 0.3–1.2)
Total Protein: 5.5 g/dL — ABNORMAL LOW (ref 6.5–8.1)

## 2019-09-19 LAB — CBC WITH DIFFERENTIAL/PLATELET
Abs Immature Granulocytes: 0.04 10*3/uL (ref 0.00–0.07)
Basophils Absolute: 0 10*3/uL (ref 0.0–0.1)
Basophils Relative: 0 %
Eosinophils Absolute: 0.7 10*3/uL — ABNORMAL HIGH (ref 0.0–0.5)
Eosinophils Relative: 10 %
HCT: 32.1 % — ABNORMAL LOW (ref 36.0–46.0)
Hemoglobin: 10 g/dL — ABNORMAL LOW (ref 12.0–15.0)
Immature Granulocytes: 1 %
Lymphocytes Relative: 26 %
Lymphs Abs: 1.9 10*3/uL (ref 0.7–4.0)
MCH: 30.6 pg (ref 26.0–34.0)
MCHC: 31.2 g/dL (ref 30.0–36.0)
MCV: 98.2 fL (ref 80.0–100.0)
Monocytes Absolute: 1.3 10*3/uL — ABNORMAL HIGH (ref 0.1–1.0)
Monocytes Relative: 18 %
Neutro Abs: 3.2 10*3/uL (ref 1.7–7.7)
Neutrophils Relative %: 45 %
Platelets: 137 10*3/uL — ABNORMAL LOW (ref 150–400)
RBC: 3.27 MIL/uL — ABNORMAL LOW (ref 3.87–5.11)
RDW: 15.5 % (ref 11.5–15.5)
WBC: 7.2 10*3/uL (ref 4.0–10.5)
nRBC: 0 % (ref 0.0–0.2)

## 2019-09-19 LAB — SARS CORONAVIRUS 2 (TAT 6-24 HRS): SARS Coronavirus 2: NEGATIVE

## 2019-09-19 MED ORDER — MORPHINE SULFATE (CONCENTRATE) 10 MG /0.5 ML PO SOLN: 5.0000 mg | ORAL | 0 refills | Status: AC | PRN

## 2019-09-19 NOTE — NC FL2 (Addendum)
[place] LEVEL OF CARE SCREENING TOOL     IDENTIFICATION  Patient Name: Catherine Landry Birthdate: [birth year] Sex: female Admission Date (Current Location): 09/14/2019  [place] and [place] Number:  Engineering geologist and Address:  [facility], [address] [place], [place] [address]      Provider Number: [id number]  Attending Physician Name and Address:  Max Sane, MD  Relative Name and Phone Number:  Ladora Daniel   [phone]    Current Level of Care: Hospital Recommended Level of Care: Assisted Living Facility([facility]) Prior Approval Number:    Date Approved/Denied:   PASRR Number:    Discharge Plan: Other (Comment)([facility])    Current Diagnoses: Patient Active Problem List   Diagnosis Date Noted  . Encounter for hospice care discussion   . Palliative care by specialist   . DNR (do not resuscitate) discussion   . Goals of care, counseling/discussion   . Multifocal pneumonia   . Essential hypertension   . AKI (acute kidney injury) ([place])   . Respiratory failure with hypoxia ([place]) 08/31/2019  . Respiratory failure, acute ([place]) 04/26/2019  . Elevated troponin 04/26/2019  . Personal history of bladder cancer 03/16/2018  . Acute on chronic respiratory failure with hypoxia ([place]) 11/23/2017  . UTI (lower urinary tract infection) 04/25/2016  . COPD exacerbation ([place]) 10/06/2015    Orientation RESPIRATION BLADDER Height & Weight     Self, Time, Situation, Place  O2(3L) Incontinent Weight: 123 lb 4.8 oz (55.9 kg) Height:  5\' 2"  (157.5 cm)  BEHAVIORAL SYMPTOMS/MOOD NEUROLOGICAL BOWEL NUTRITION STATUS      Incontinent Diet(Regular diet)  AMBULATORY STATUS COMMUNICATION OF NEEDS Skin   Supervision Verbally Normal                       Personal Care Assistance Level of Assistance  Bathing, Feeding, Dressing Bathing Assistance: Limited assistance Feeding assistance: Independent Dressing  Assistance: Limited assistance     Functional Limitations Info  Sight, Hearing, Speech Sight Info: Adequate Hearing Info: Adequate Speech Info: Adequate    SPECIAL CARE FACTORS FREQUENCY                       Contractures Contractures Info: Not present    Additional Factors Info  Allergies Code Status Info: DNR Allergies Info: Nitrofurantoin           Current Medications (09/19/2019):  This is the current hospital active medication list Current Facility-Administered Medications  Medication Dose Route Frequency Provider Last Rate Last Admin  . 0.9 %  sodium chloride infusion  250 mL Intravenous PRN Toy Baker, MD   Stopped at 09/16/19 [id number]  . acetaminophen (TYLENOL) tablet 650 mg  650 mg Oral Q6H PRN Toy Baker, MD   650 mg at 09/19/19 0227   Or  . acetaminophen (TYLENOL) suppository 650 mg  650 mg Rectal Q6H PRN Doutova, Anastassia, MD      . albuterol (VENTOLIN HFA) 108 (90 Base) MCG/ACT inhaler 2 puff  2 puff Inhalation Q4H PRN Lang Snow, NP   2 puff at 09/17/19 0218  . diphenhydrAMINE (BENADRYL) injection 25 mg  25 mg Intravenous Q4H PRN Max Sane, MD      . glycopyrrolate (ROBINUL) tablet 1 mg  1 mg Oral Q4H PRN Max Sane, MD       Or  . glycopyrrolate (ROBINUL) injection 0.2 mg  0.2 mg Subcutaneous Q4H PRN Max Sane, MD  Or  . glycopyrrolate (ROBINUL) injection 0.2 mg  0.2 mg Intravenous Q4H PRN Max Sane, MD      . guaiFENesin-dextromethorphan (ROBITUSSIN DM) 100-10 MG/5ML syrup 10 mL  10 mL Oral Q4H PRN Doutova, Anastassia, MD   10 mL at 09/17/19 0134  . haloperidol lactate (HALDOL) injection 2.5-5 mg  2.5-5 mg Intravenous Q4H PRN Max Sane, MD      . ipratropium-albuterol (DUONEB) 0.5-2.5 (3) MG/3ML nebulizer solution 3 mL  3 mL Nebulization Q6H PRN Max Sane, MD   3 mL at 09/19/19 0830  . magnesium hydroxide (MILK OF MAGNESIA) suspension 30 mL  30 mL Oral Daily PRN Max Sane, MD   30 mL at 09/17/19 0225  .  MEDLINE mouth rinse  15 mL Mouth Rinse BID Toy Baker, MD   15 mL at 09/18/19 2124  . morphine 2 MG/ML injection 2 mg  2 mg Intravenous Q2H PRN Max Sane, MD   2 mg at 09/17/19 1117  . ondansetron (ZOFRAN) tablet 4 mg  4 mg Oral Q6H PRN Doutova, Anastassia, MD       Or  . ondansetron (ZOFRAN) injection 4 mg  4 mg Intravenous Q6H PRN Doutova, Anastassia, MD      . polyethylene glycol (MIRALAX / GLYCOLAX) packet 17 g  17 g Oral Daily Max Sane, MD   17 g at 09/18/19 1117  . polyvinyl alcohol (LIQUIFILM TEARS) 1.4 % ophthalmic solution 1 drop  1 drop Both Eyes QID PRN Max Sane, MD      . senna-docusate (Senokot-S) tablet 2 tablet  2 tablet Oral BID Max Sane, MD   2 tablet at 09/18/19 1117  . sodium chloride flush (NS) 0.9 % injection 3 mL  3 mL Intravenous Q12H Doutova, Anastassia, MD   3 mL at 09/18/19 1120  . sodium chloride flush (NS) 0.9 % injection 3 mL  3 mL Intravenous PRN Toy Baker, MD   3 mL at 09/16/19 0502  . sodium chloride flush (NS) 0.9 % injection 3 mL  3 mL Intravenous Q12H Doutova, Anastassia, MD   3 mL at 09/19/19 0856     Discharge Medications:  TAKE these medications   acetaminophen 650 MG CR tablet Commonly known as: TYLENOL Take 650 mg by mouth every 8 (eight) hours as needed for pain.   ascorbic acid 500 MG tablet Commonly known as: VITAMIN C Take 1 tablet by mouth daily.   aspirin EC 81 MG tablet Take 81 mg by mouth every morning.   busPIRone 7.5 MG tablet Commonly known as: BUSPAR Take 7.5 mg by mouth 2 (two) times daily.   Calcium 600+D Plus Minerals 600-400 MG-UNIT Tabs Take 2 tablets by mouth daily.   clopidogrel 75 MG tablet Commonly known as: Plavix Take 1 tablet (75 mg total) by mouth daily.   Combivent Respimat 20-100 MCG/ACT Aers respimat Generic drug: Ipratropium-Albuterol Inhale 1 puff into the lungs 4 (four) times daily as needed for wheezing or shortness of breath.   fluticasone 50 MCG/ACT nasal  spray Commonly known as: FLONASE Place 2 sprays into both nostrils daily.   Fluticasone-Salmeterol 250-50 MCG/DOSE Aepb Commonly known as: ADVAIR Inhale 1 puff into the lungs 2 (two) times daily.   furosemide 20 MG tablet Commonly known as: LASIX Take 20 mg by mouth daily.   iron polysaccharides 150 MG capsule Commonly known as: NIFEREX Take 150 mg by mouth 2 (two) times daily.   levalbuterol 0.63 MG/3ML nebulizer solution Commonly known as: XOPENEX INHALE THE CONTENTS OF 1  VIAL IN NEBULIZER EVERY 6 HRS AS NEEDED FOR WHEEZING OR SHORTNESS OF BREATH   losartan 50 MG tablet Commonly known as: COZAAR Take 50 mg by mouth every morning.   metoprolol succinate 25 MG 24 hr tablet Commonly known as: TOPROL-XL Take 0.5 tablets (12.5 mg total) by mouth every morning.   morphine CONCENTRATE 10 mg / 0.5 ml concentrated solution Take 0.25 mLs (5 mg total) by mouth every 2 (two) hours as needed for severe pain, anxiety or shortness of breath.   multivitamin with minerals Tabs tablet Take 1 tablet by mouth every morning.   nitroGLYCERIN 0.4 MG SL tablet Commonly known as: NITROSTAT Place 0.4 mg under the tongue every 5 (five) minutes as needed for chest pain.   pantoprazole 40 MG tablet Commonly known as: PROTONIX Take 40 mg by mouth 2 (two) times daily.   polyethylene glycol powder 17 GM/SCOOP powder Commonly known as: GLYCOLAX/MIRALAX Take 17 g by mouth daily as needed for moderate constipation. Mix with 4 to 8 ounces of fluid.   theophylline 300 MG 12 hr tablet Commonly known as: THEODUR Take 300 mg by mouth daily.   vitamin B-12 1000 MCG tablet Commonly known as: CYANOCOBALAMIN Take 3,000 mcg by mouth daily.      Relevant Imaging Results:  Relevant Lab Results:   Additional Information SSN [id number]  Ross Ludwig, LCSW

## 2019-09-19 NOTE — Discharge Instructions (Signed)

## 2019-09-19 NOTE — TOC Transition Note (Addendum)
Transition of Care [facility]) - CM/SW Discharge Note   Patient Details  Name: Catherine Landry MRN: [id number] Date of Birth: Jun 03, 1936  [facility]) CM/SW Contact:  Ross Ludwig, LCSW Phone Number: 09/19/2019, 5:03 PM   Clinical Narrative:     Patient will be [facility] with hospice services to follow.  [facility] will be following patient, equipment will be provided.  Patient will need EMS transport, patient's brother Fredderick Phenix is the [facility] and he is aware of patient discharging today.  Patient's brother requested a copy of the AVS, CSW emailed him a copy.  CSW faxed FL2 and discharge summary to [place], [place] signing off.   Final next level of care: Assisted Living Barriers to Discharge: Barriers Resolved   Patient Goals and CMS Choice Patient states their goals for this hospitalization and ongoing recovery are:: To return to [facility] with hospice services to follow. CMS Medicare.gov Compare Post Acute Care list provided to:: Patient Represenative (must comment) Choice offered to / list presented to : [facility] POA / Guardian(Patient's HCPOA Trip her brother.)  Discharge Placement  [facility]            Patient chooses bed at: Other - please specify in the comment section below:([facility]) Patient to be transferred to facility by: [facility] Name of family member notified: Patient's brother Tripp Patient and family notified of of transfer: 09/19/19  Discharge Plan and [facility] with hospice services through [place].              DME Arranged: Oxygen, Bedside commode, Nebulizer machine, Walker DME Agency: Other - Comment([facility]) Date DME Agency Contacted: 09/19/19 Time DME Agency Contacted: [phone] Representative spoke with at [place]: [place]: [facility] Date [place]: 09/18/19 Time [place]: 1500 Representative spoke with at [place]:  [place] ([place]) Interventions     Readmission Risk Interventions Readmission Risk Prevention Plan 09/05/2019 04/30/2019  Transportation Screening - Complete  PCP or Specialist Appt within 5-7 Days - Complete  PCP or Specialist Appt within 3-5 Days Complete -  Home Care Screening - Complete  Medication Review (RN CM) - Complete  HRI or Home Care Consult Complete -  Social Work Consult for [place] Planning/Counseling Complete -  Palliative Care Screening Complete -  Some recent data might be hidden

## 2019-09-19 NOTE — Progress Notes (Signed)
[facility] called for report, EMS called for transport, patient prepared for transportation. No acute distress at this time.

## 2019-09-19 NOTE — Discharge Summary (Addendum)
[place] [facility] [place] NAME: Catherine Landry    MR#:  [id number]  DATE OF BIRTH:  1936-01-07  DATE OF ADMISSION:  09/14/2019   ADMITTING PHYSICIAN: Toy Baker, MD  DATE OF DISCHARGE: 09/19/2019  PRIMARY CARE PHYSICIAN: Tracie Harrier, MD   ADMISSION DIAGNOSIS:  SOB (shortness of breath) [R06.02] COPD exacerbation (HCC) [J44.1] DISCHARGE DIAGNOSIS:  Active Problems:   COPD exacerbation (HCC)   Acute on chronic respiratory failure with hypoxia (HCC)   Elevated troponin   Multifocal pneumonia   Essential hypertension   DNR (do not resuscitate) discussion  SECONDARY DIAGNOSIS:   Past Medical History:  Diagnosis Date  . Arthritis   . Asthma   . Bladder cancer ([place]) 2015  . Cancer [facility]) 2001   kidney left   . CHF (congestive heart failure) ([place])   . COPD (chronic obstructive pulmonary disease) ([place])   . GERD (gastroesophageal reflux disease)   . Gout   . History of bladder cancer   . History of kidney cancer   . Hypertension   . Raynaud's disease   . Renal insufficiency    HOSPITAL COURSE:  83 y.o.femalewith medical history significant of CAD sp stent placement, HTN, COPD on 2L O2, CKD stage 2,chronic diastolicCHF, GERD, renal cancer status post left nephrectomy, recently admitted for COPD exacerbation- Presented withworsening shortness of breath and tachypneaAdmitted for COPD exacerbation  Present on Admission: .COPD exacerbation(HCC) -On 3 L nasal cannula oxygen which is her baseline Her multiple COVID test has been neg  .Acute on chronic respiratory failure with hypoxia (HCC)and hypercarbia At baseline  .Elevated troponin -due to demand ischemia   .Multifocal pneumonia - viral - Normal procalcitonin  .Essential hypertension CKD 2  Hyperkalemia  Overall poor prognosis. DISCHARGE CONDITIONS:  fair CONSULTS OBTAINED:   DRUG ALLERGIES:   Allergies  Allergen Reactions  . Nitrofurantoin  Diarrhea and Other (See Comments)    Headache   DISCHARGE MEDICATIONS:   Allergies as of 09/19/2019      Reactions   Nitrofurantoin Diarrhea, Other (See Comments)   Headache      Medication List    TAKE these medications   acetaminophen 650 MG CR tablet Commonly known as: TYLENOL Take 650 mg by mouth every 8 (eight) hours as needed for pain.   ascorbic acid 500 MG tablet Commonly known as: VITAMIN C Take 1 tablet by mouth daily.   aspirin EC 81 MG tablet Take 81 mg by mouth every morning.   busPIRone 7.5 MG tablet Commonly known as: BUSPAR Take 7.5 mg by mouth 2 (two) times daily.   Calcium 600+D Plus Minerals 600-400 MG-UNIT Tabs Take 2 tablets by mouth daily.   clopidogrel 75 MG tablet Commonly known as: Plavix Take 1 tablet (75 mg total) by mouth daily.   Combivent Respimat 20-100 MCG/ACT Aers respimat Generic drug: Ipratropium-Albuterol Inhale 1 puff into the lungs 4 (four) times daily as needed for wheezing or shortness of breath.   fluticasone 50 MCG/ACT nasal spray Commonly known as: FLONASE Place 2 sprays into both nostrils daily.   Fluticasone-Salmeterol 250-50 MCG/DOSE Aepb Commonly known as: ADVAIR Inhale 1 puff into the lungs 2 (two) times daily.   furosemide 20 MG tablet Commonly known as: LASIX Take 20 mg by mouth daily.   iron polysaccharides 150 MG capsule Commonly known as: NIFEREX Take 150 mg by mouth 2 (two) times daily.   levalbuterol 0.63 MG/3ML nebulizer solution Commonly known as: XOPENEX INHALE THE CONTENTS OF 1 VIAL IN NEBULIZER  EVERY 6 HRS AS NEEDED FOR WHEEZING OR SHORTNESS OF BREATH   losartan 50 MG tablet Commonly known as: COZAAR Take 50 mg by mouth every morning.   metoprolol succinate 25 MG 24 hr tablet Commonly known as: TOPROL-XL Take 0.5 tablets (12.5 mg total) by mouth every morning.   morphine CONCENTRATE 10 mg / 0.5 ml concentrated solution Take 0.25 mLs (5 mg total) by mouth every 2 (two) hours as needed for  severe pain, anxiety or shortness of breath.   multivitamin with minerals Tabs tablet Take 1 tablet by mouth every morning.   nitroGLYCERIN 0.4 MG SL tablet Commonly known as: NITROSTAT Place 0.4 mg under the tongue every 5 (five) minutes as needed for chest pain.   pantoprazole 40 MG tablet Commonly known as: PROTONIX Take 40 mg by mouth 2 (two) times daily.   polyethylene glycol powder 17 GM/SCOOP powder Commonly known as: GLYCOLAX/MIRALAX Take 17 g by mouth daily as needed for moderate constipation. Mix with 4 to 8 ounces of fluid.   theophylline 300 MG 12 hr tablet Commonly known as: THEODUR Take 300 mg by mouth daily.   vitamin B-12 1000 MCG tablet Commonly known as: CYANOCOBALAMIN Take 3,000 mcg by mouth daily.        DISCHARGE INSTRUCTIONS:   DIET:  Regular diet DISCHARGE CONDITION:  Fair ACTIVITY:  Activity as tolerated OXYGEN:  Home Oxygen: Yes.    Oxygen Delivery: 3 liters/min via Patient connected to nasal cannula oxygen DISCHARGE LOCATION:  [facility]   If you experience worsening of your admission symptoms, develop shortness of breath, life threatening emergency, suicidal or homicidal thoughts you must seek medical attention immediately by calling 911 or calling your MD immediately  if symptoms less severe.  You Must read complete instructions/literature along with all the possible adverse reactions/side effects for all the Medicines you take and that have been prescribed to you. Take any new Medicines after you have completely understood and accpet all the possible adverse reactions/side effects.   Please note  You were cared for by a hospitalist during your hospital stay. If you have any questions about your discharge medications or the care you received while you were in the hospital after you are discharged, you can call the unit and asked to speak with the hospitalist on call if the hospitalist that took care of you is  not available. Once you are discharged, your primary care physician will handle any further medical issues. Please note that NO REFILLS for any discharge medications will be authorized once you are discharged, as it is imperative that you return to your primary care physician (or establish a relationship with a primary care physician if you do not have one) for your aftercare needs so that they can reassess your need for medications and monitor your lab values.    On the day of Discharge:  VITAL SIGNS:  Blood pressure (!) 116/50, pulse 84, temperature (!) 97.5 F (36.4 C), temperature source Oral, resp. rate 19, height 5\' 2"  (1.575 m), weight 55.9 kg, SpO2 93 %. PHYSICAL EXAMINATION:  GENERAL:  83 y.o.-year-old patient lying in the bed with no acute distress.  EYES: Pupils equal, round, reactive to light and accommodation. No scleral icterus. Extraocular muscles intact.  HEENT: Head atraumatic, normocephalic. Oropharynx and nasopharynx clear.  NECK:  Supple, no jugular venous distention. No thyroid enlargement, no tenderness.  LUNGS: Normal breath sounds bilaterally, no wheezing, rales,rhonchi or crepitation. No use of accessory muscles of  respiration.  CARDIOVASCULAR: S1, S2 normal. No murmurs, rubs, or gallops.  ABDOMEN: Soft, non-tender, non-distended. Bowel sounds present. No organomegaly or mass.  EXTREMITIES: No pedal edema, cyanosis, or clubbing.  NEUROLOGIC: Cranial nerves II through XII are intact. Muscle strength 5/5 in all extremities. Sensation intact. Gait not checked.  PSYCHIATRIC: The patient is alert and oriented x 3.  SKIN: No obvious rash, lesion, or ulcer.  DATA REVIEW:   CBC Recent Labs  Lab 09/19/19 0407  WBC 7.2  HGB 10.0*  HCT 32.1*  PLT 137*    Chemistries  Recent Labs  Lab 09/15/19 0423 09/15/19 1503 09/19/19 0407  NA 136  --  139  K 5.4*  --  4.4  CL 96*  --  94*  CO2 33*  --  42*  GLUCOSE 151*  --  100*  BUN 21  --  22  CREATININE 0.96  --  1.16*   CALCIUM 8.5*  --  8.3*  MG 2.0  --   --   AST 17   < > 12*  ALT 12   < > 11  ALKPHOS 43   < > 42  BILITOT 0.8   < > 0.6   < > = values in this interval not displayed.     Follow-up Information    Tracie Harrier, MD. Schedule an appointment as soon as possible for a visit in 1 week(s).   Specialty: Internal Medicine Contact information: [address] [place] [address] [phone]           Management plans discussed with the patient, family (d/w son over phone) and they are in agreement.  CODE STATUS: DNR   TOTAL TIME TAKING CARE OF THIS PATIENT: 45 minutes.    Max Sane M.D on 09/19/2019 at 10:32 AM  Between 7am to 6pm - Pager - [phone]  After 6pm go to [email or web address] - password [email or web address]  [facility]   CC: Primary care physician; Tracie Harrier, MD   Note: This dictation was prepared with Dragon dictation along with smaller phrase technology. Any transcriptional errors that result from this process are unintentional.

## 2019-09-19 NOTE — Progress Notes (Signed)
Plan is for discharge to [facility] via EMS today. [facility] has been requested for delivery today. Signed DNR to accompany patient at discharge. Repeat COVID test is negative. Discharge summary faxed to referral. Flo Shanks BSN, RN, [place] [phone]

## 2019-09-22 DEATH — deceased

## 2019-10-17 ENCOUNTER — Other Ambulatory Visit: Payer: Self-pay | Admitting: Internal Medicine

## 2020-08-04 IMAGING — CT CT ANGIO CHEST
2 of 6 series · 18 of 46 positions shown · IV contrast (APPLIED)
Comparison: Chest x-ray from earlier in the same day.

CLINICAL DATA: Shortness of breath with 9L20U-ZB exposure

EXAM:
CT ANGIOGRAPHY CHEST WITH CONTRAST
TECHNIQUE: Multidetector CT imaging of the chest was performed using the
standard protocol during bolus administration of intravenous
contrast. Multiplanar CT image reconstructions and MIPs were
obtained to evaluate the vascular anatomy.
CONTRAST:  60mL OMNIPAQUE IOHEXOL 350 MG/ML SOLN

[Series 5: thins · axial · 0.64mm/px · z∈[-635,-365]mm · 15 of 296 slices shown]
[im 13/296  lung]
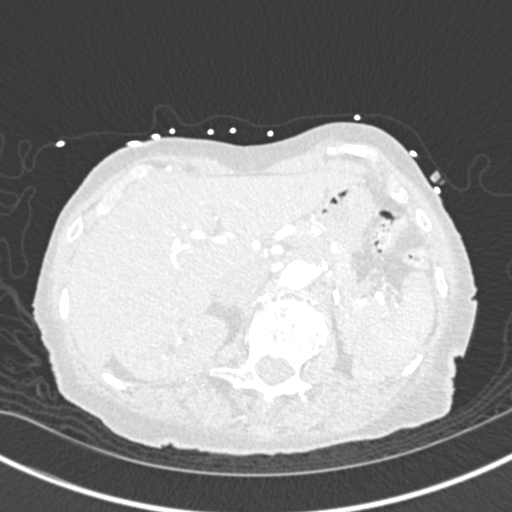
[im 39/296  soft-tissue]
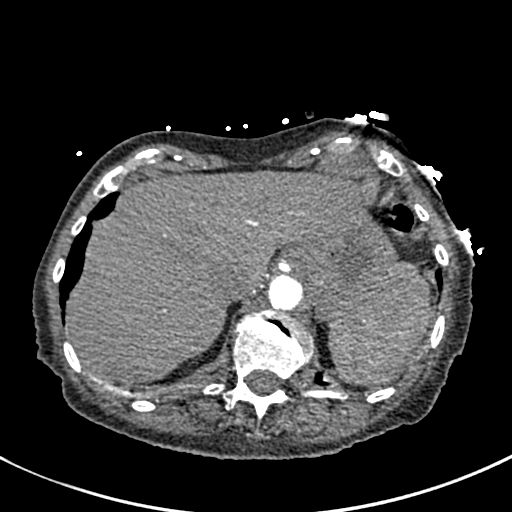
[im 52/296  lung]
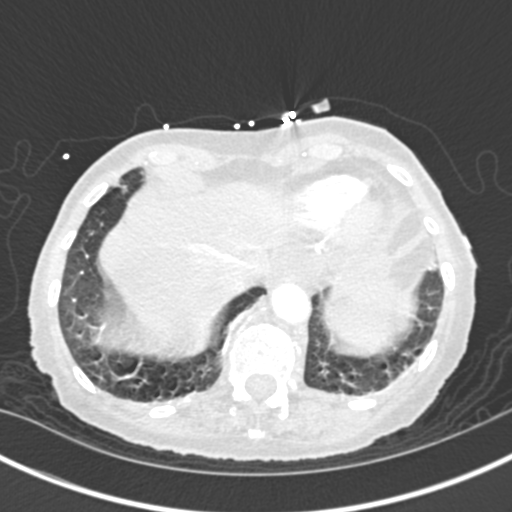
[im 77/296  soft-tissue]
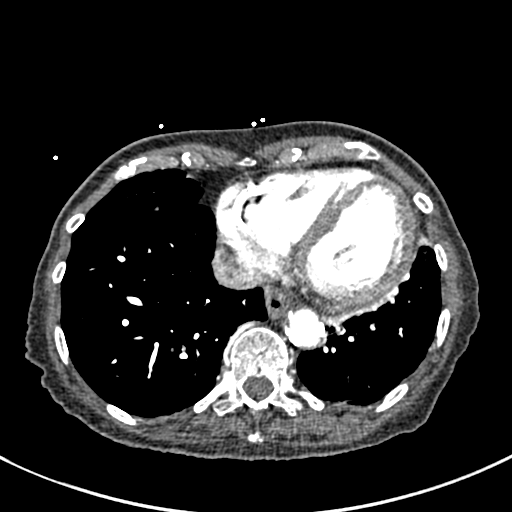
[im 90/296  lung]
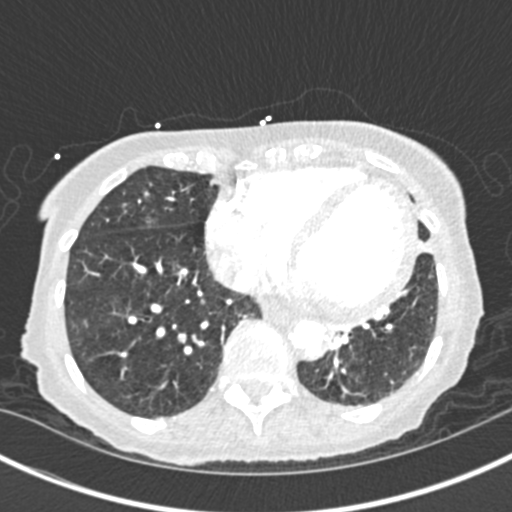
[im 116/296  soft-tissue]
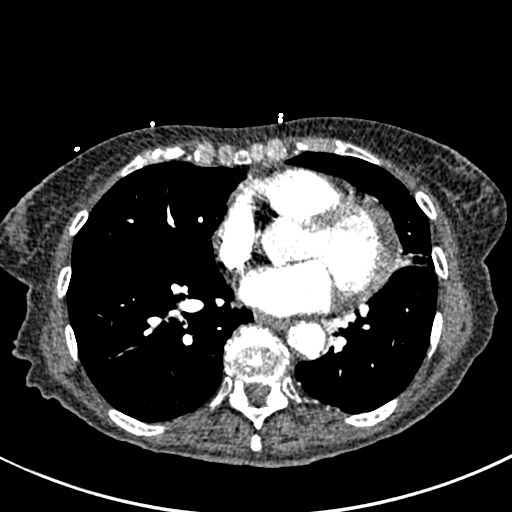
[im 129/296  lung]
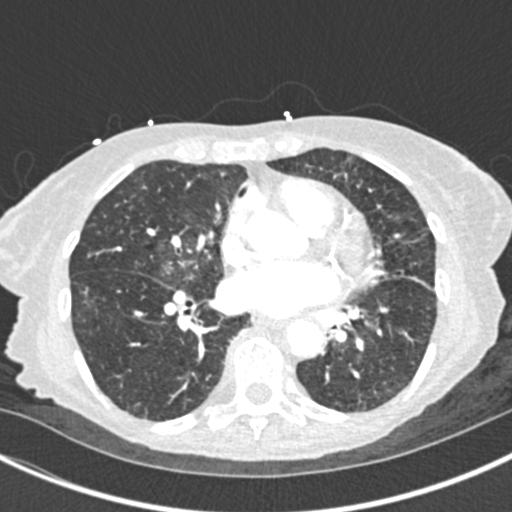
[im 154/296  soft-tissue]
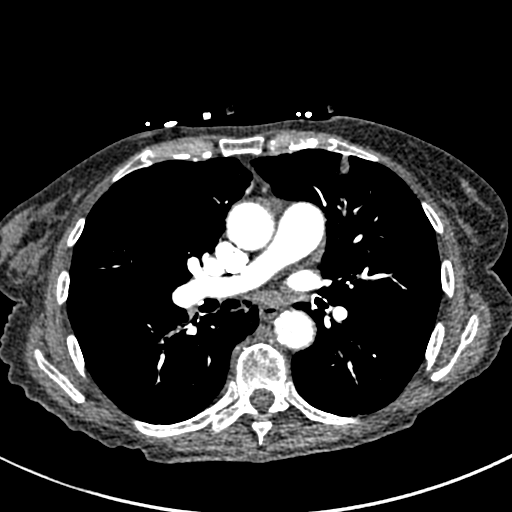
[im 167/296  lung]
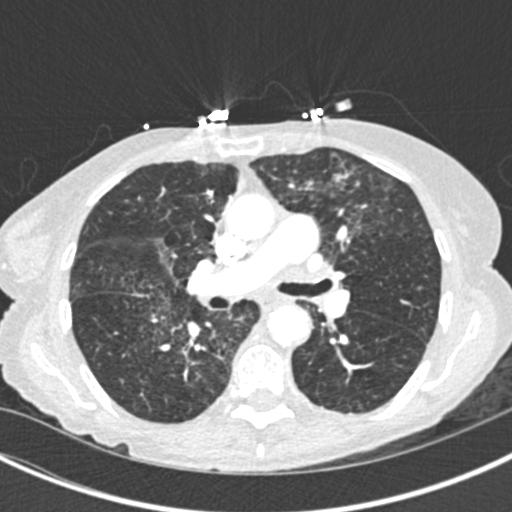
[im 180/296  soft-tissue]
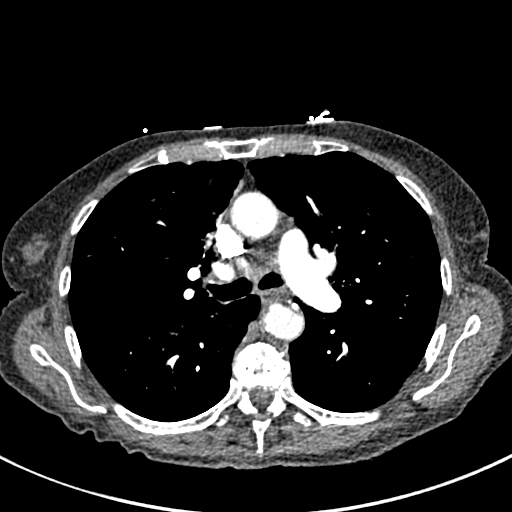
[im 206/296  lung]
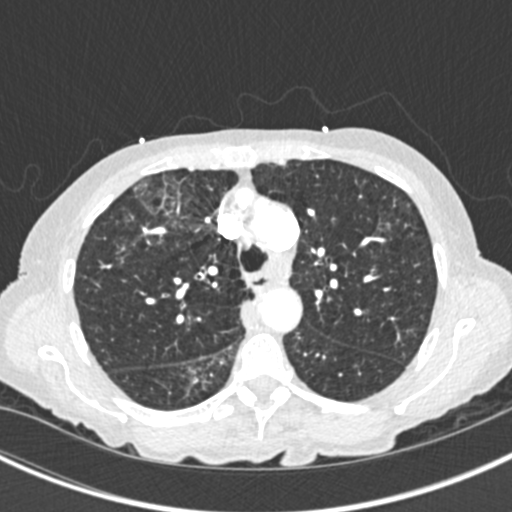
[im 219/296  soft-tissue]
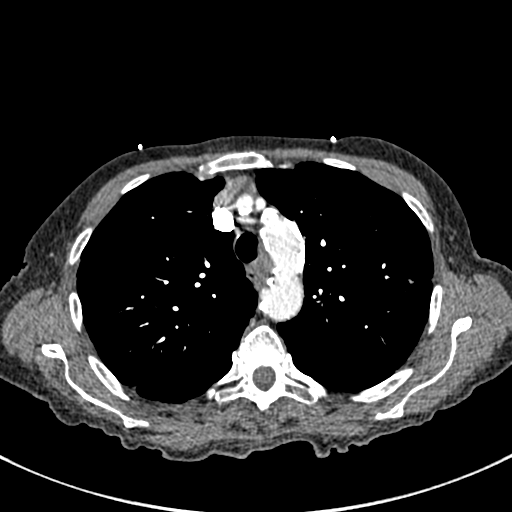
[im 244/296  lung]
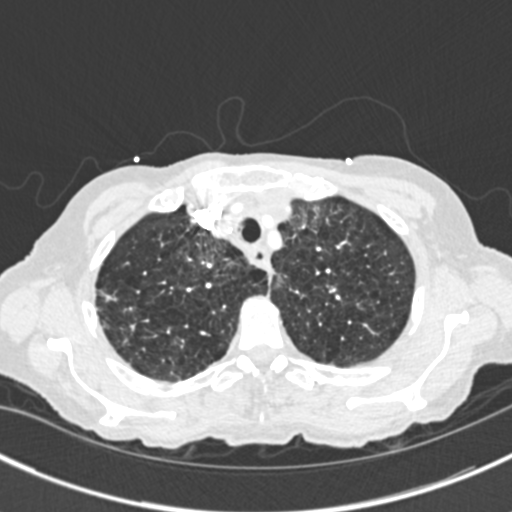
[im 257/296  soft-tissue]
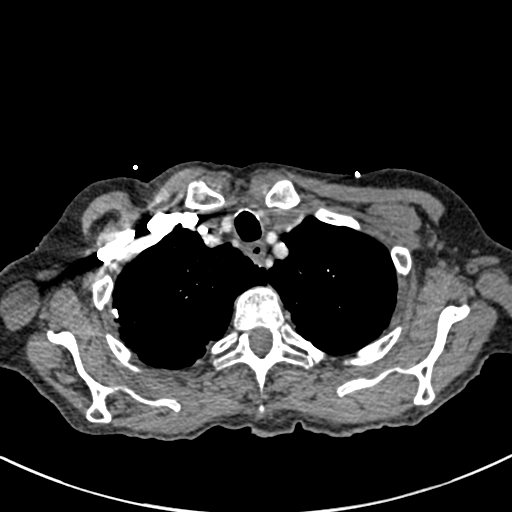
[im 283/296  lung]
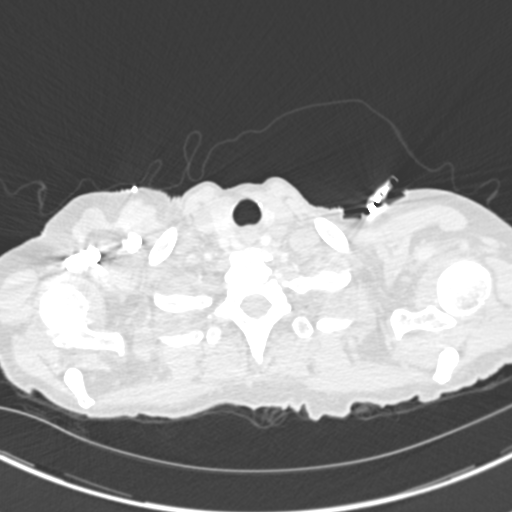

[Series 7: coronal mpr · coronal · 0.56mm/px · 3 of 75 slices shown]
[im 19/75  soft-tissue]
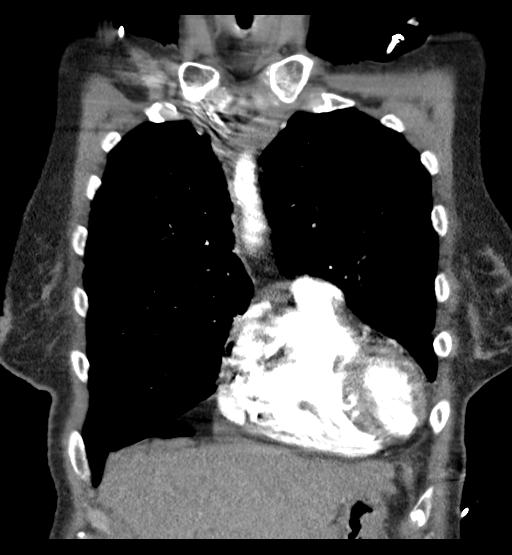
[im 38/75  soft-tissue]
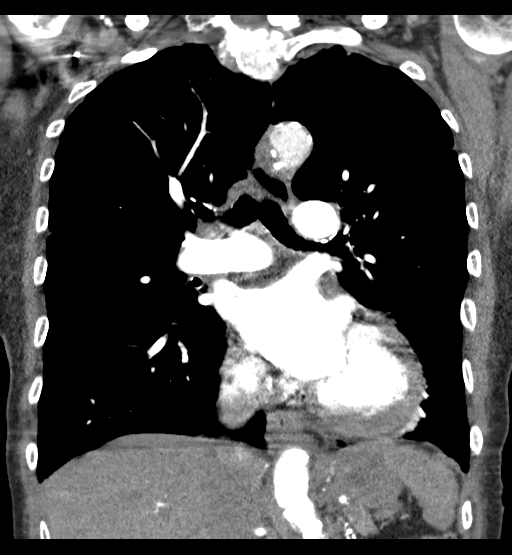
[im 56/75  soft-tissue]
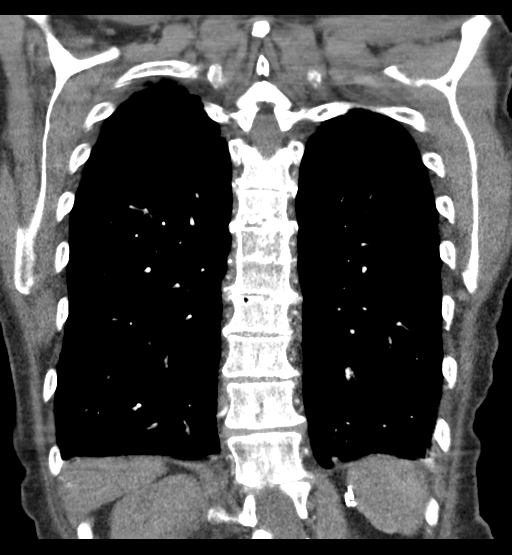

[18 of 46 positions shown; findings below may reference images not displayed]

FINDINGS: Cardiovascular: Thoracic aorta demonstrates atherosclerotic
calcifications without aneurysmal dilatation or dissection. Coronary
calcifications are seen. No significant cardiac enlargement is
noted. The pulmonary artery shows a normal branching pattern. No
intraluminal filling defect to suggest pulmonary embolism is
identified.

Mediastinum/Nodes: Thoracic inlet is within normal limits. No
sizable hilar or mediastinal adenopathy is noted. Scattered small
nodes are the subcarinal region as well as the AP window. The
esophagus as visualized appears within normal limits.

Lungs/Pleura: Diffuse emphysematous changes are noted. Patchy
ground-glass infiltrates are noted. Given the patient's clinical
history this is highly suggestive of 9L20U-ZB pneumonia. Stable left
upper lobe nodule is noted best seen on image number 15 of series 6
unchanged from 6339. A nodule is also seen in the lateral aspect of
the left upper lobe best seen on image number 27 of series 6 also
stable in appearance from the prior exam. Some areas of bronchial
mucous plugging are noted. Stable calcifications are noted in the
right upper lobe. No sizable effusion is seen.

Upper Abdomen: Visualized upper abdomen shows no acute abnormality.

Musculoskeletal: Degenerative changes of the thoracic spine are
noted. No acute bony abnormality is seen.

Review of the MIP images confirms the above findings.
IMPRESSION: Patchy ground-glass infiltrates within the lungs bilaterally. Given
the patient's recent exposure to 9L20U-ZB this is highly suggestive
of viral pneumonia. Scattered mediastinal lymph nodes are noted
likely of a reactive nature.

Stable scattered small nodules when compared with 6339 consistent
with a benign etiology. No further follow-up is recommended.

No evidence of pulmonary emboli.

Aortic Atherosclerosis (YMQ6V-0HP.P) and Emphysema (YMQ6V-JJ7.R).

## 2020-08-04 IMAGING — DX DG CHEST 1V PORT
1 series · 1 of 1 positions shown · non-contrast
Comparison: April 27, 2019

CLINICAL DATA: Difficulty breathing today. Patient's husband is
[id number]-PA positive.

EXAM:
PORTABLE CHEST 1 VIEW

[chest ap]
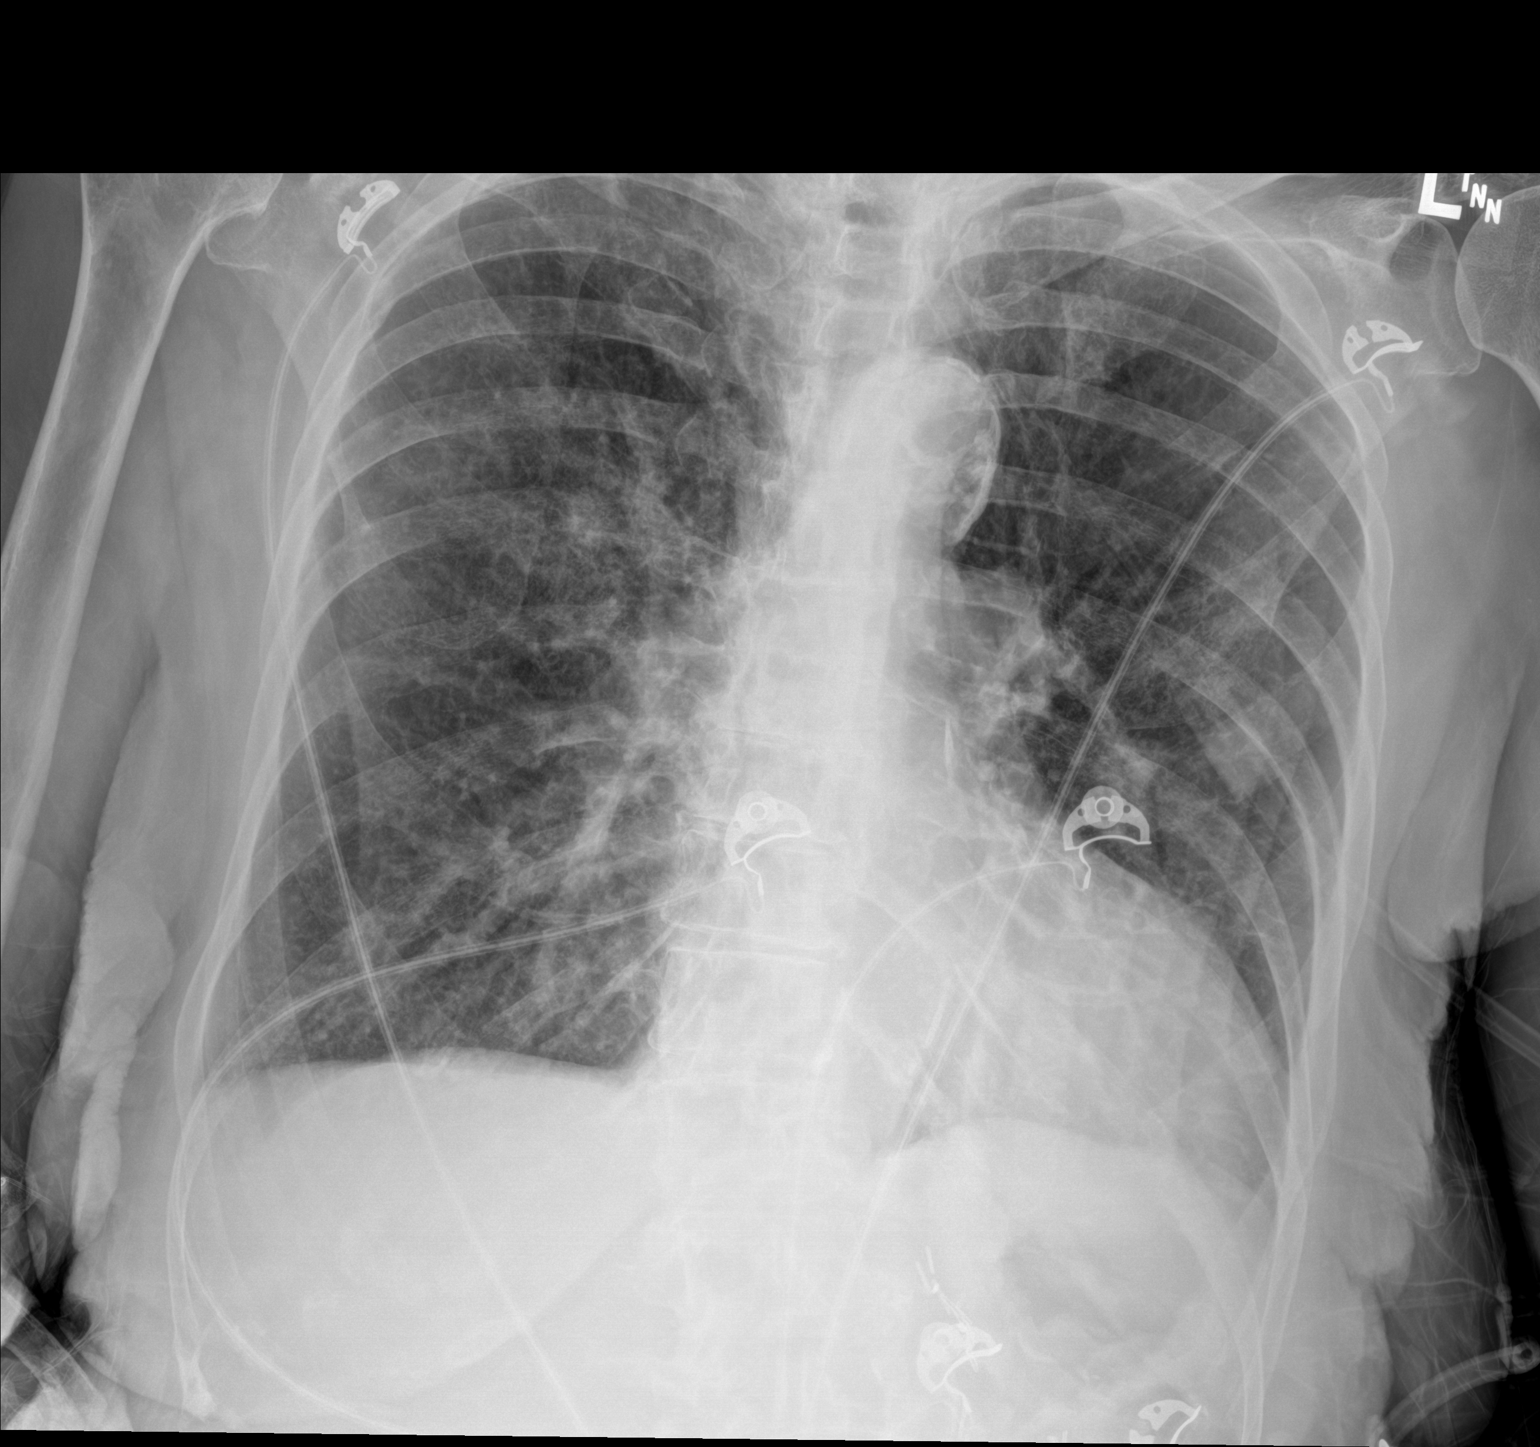

[1 of 1 positions shown; findings below may reference images not displayed]

FINDINGS: The mediastinal contour is normal. The heart size is upper limits of
normal. Minimal increased pulmonary interstitium with minimal hazy
ground-glass opacity is identified in the bilateral lungs. There is
no pleural effusion. The bony structures are stable.
IMPRESSION: Minimal increased pulmonary interstitium with minimal hazy
ground-glass opacity is identified in the bilateral lungs. This can
be seen in early pneumonia.
